# Patient Record
Sex: Female | Born: 1965 | Race: White | Hispanic: No | Marital: Married | Smoking: Current every day smoker
Health system: Southern US, Community
[De-identification: ages and names within clinical notes are randomized; demographics above are authoritative.]

## PROBLEM LIST (undated history)

## (undated) DIAGNOSIS — K629 Disease of anus and rectum, unspecified: Secondary | ICD-10-CM

## (undated) DIAGNOSIS — I10 Essential (primary) hypertension: Secondary | ICD-10-CM

## (undated) DIAGNOSIS — F329 Major depressive disorder, single episode, unspecified: Secondary | ICD-10-CM

## (undated) DIAGNOSIS — I2510 Atherosclerotic heart disease of native coronary artery without angina pectoris: Secondary | ICD-10-CM

## (undated) DIAGNOSIS — F32A Depression, unspecified: Secondary | ICD-10-CM

## (undated) DIAGNOSIS — G4700 Insomnia, unspecified: Secondary | ICD-10-CM

## (undated) DIAGNOSIS — T7840XA Allergy, unspecified, initial encounter: Secondary | ICD-10-CM

## (undated) DIAGNOSIS — F411 Generalized anxiety disorder: Secondary | ICD-10-CM

## (undated) DIAGNOSIS — F419 Anxiety disorder, unspecified: Secondary | ICD-10-CM

## (undated) DIAGNOSIS — I252 Old myocardial infarction: Secondary | ICD-10-CM

## (undated) DIAGNOSIS — I219 Acute myocardial infarction, unspecified: Secondary | ICD-10-CM

## (undated) DIAGNOSIS — M329 Systemic lupus erythematosus, unspecified: Secondary | ICD-10-CM

## (undated) DIAGNOSIS — O24419 Gestational diabetes mellitus in pregnancy, unspecified control: Secondary | ICD-10-CM

## (undated) DIAGNOSIS — E785 Hyperlipidemia, unspecified: Secondary | ICD-10-CM

## (undated) DIAGNOSIS — K219 Gastro-esophageal reflux disease without esophagitis: Secondary | ICD-10-CM

## (undated) DIAGNOSIS — K760 Fatty (change of) liver, not elsewhere classified: Secondary | ICD-10-CM

## (undated) DIAGNOSIS — D649 Anemia, unspecified: Secondary | ICD-10-CM

## (undated) DIAGNOSIS — IMO0002 Reserved for concepts with insufficient information to code with codable children: Secondary | ICD-10-CM

## (undated) DIAGNOSIS — M3500 Sicca syndrome, unspecified: Secondary | ICD-10-CM

## (undated) DIAGNOSIS — I781 Nevus, non-neoplastic: Secondary | ICD-10-CM

## (undated) DIAGNOSIS — L720 Epidermal cyst: Secondary | ICD-10-CM

## (undated) DIAGNOSIS — F988 Other specified behavioral and emotional disorders with onset usually occurring in childhood and adolescence: Secondary | ICD-10-CM

## (undated) DIAGNOSIS — E559 Vitamin D deficiency, unspecified: Secondary | ICD-10-CM

## (undated) HISTORY — DX: Allergy, unspecified, initial encounter: T78.40XA

## (undated) HISTORY — DX: Gastro-esophageal reflux disease without esophagitis: K21.9

## (undated) HISTORY — DX: Gestational diabetes mellitus in pregnancy, unspecified control: O24.419

## (undated) HISTORY — DX: Epidermal cyst: L72.0

## (undated) HISTORY — DX: Nevus, non-neoplastic: I78.1

## (undated) HISTORY — DX: Anemia, unspecified: D64.9

## (undated) HISTORY — DX: Anxiety disorder, unspecified: F41.9

## (undated) HISTORY — DX: Fatty (change of) liver, not elsewhere classified: K76.0

## (undated) HISTORY — DX: Major depressive disorder, single episode, unspecified: F32.9

## (undated) HISTORY — DX: Generalized anxiety disorder: F41.1

## (undated) HISTORY — PX: ABDOMINAL HYSTERECTOMY: SHX81

## (undated) HISTORY — PX: COLON SURGERY: SHX602

## (undated) HISTORY — DX: Sjogren syndrome, unspecified: M35.00

## (undated) HISTORY — DX: Depression, unspecified: F32.A

## (undated) HISTORY — PX: TONSILECTOMY, ADENOIDECTOMY, BILATERAL MYRINGOTOMY AND TUBES: SHX2538

## (undated) HISTORY — DX: Reserved for concepts with insufficient information to code with codable children: IMO0002

## (undated) HISTORY — DX: Other specified behavioral and emotional disorders with onset usually occurring in childhood and adolescence: F98.8

## (undated) HISTORY — DX: Insomnia, unspecified: G47.00

## (undated) HISTORY — DX: Disease of anus and rectum, unspecified: K62.9

## (undated) HISTORY — DX: Essential (primary) hypertension: I10

## (undated) HISTORY — DX: Systemic lupus erythematosus, unspecified: M32.9

## (undated) HISTORY — DX: Old myocardial infarction: I25.2

## (undated) HISTORY — DX: Vitamin D deficiency, unspecified: E55.9

## (undated) HISTORY — DX: Acute myocardial infarction, unspecified: I21.9

## (undated) HISTORY — DX: Hyperlipidemia, unspecified: E78.5

---
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
## 1997-08-21 ENCOUNTER — Observation Stay (HOSPITAL_COMMUNITY): Admission: RE | Admit: 1997-08-21 | Discharge: 1997-08-21 | Payer: Self-pay | Admitting: Family Medicine

## 1997-09-20 ENCOUNTER — Inpatient Hospital Stay (HOSPITAL_COMMUNITY): Admission: AD | Admit: 1997-09-20 | Discharge: 1997-09-20 | Payer: Self-pay | Admitting: Obstetrics and Gynecology

## 1997-09-23 ENCOUNTER — Ambulatory Visit (HOSPITAL_COMMUNITY): Admission: RE | Admit: 1997-09-23 | Discharge: 1997-09-23 | Payer: Self-pay | Admitting: Obstetrics and Gynecology

## 1997-09-27 ENCOUNTER — Ambulatory Visit (HOSPITAL_COMMUNITY): Admission: RE | Admit: 1997-09-27 | Discharge: 1997-09-27 | Payer: Self-pay | Admitting: Obstetrics and Gynecology

## 1997-10-13 ENCOUNTER — Ambulatory Visit (HOSPITAL_COMMUNITY): Admission: RE | Admit: 1997-10-13 | Discharge: 1997-10-13 | Payer: Self-pay | Admitting: Urology

## 1998-10-21 ENCOUNTER — Encounter: Payer: Self-pay | Admitting: Family Medicine

## 1998-10-21 ENCOUNTER — Ambulatory Visit (HOSPITAL_COMMUNITY): Admission: RE | Admit: 1998-10-21 | Discharge: 1998-10-21 | Payer: Self-pay | Admitting: Family Medicine

## 1998-11-03 ENCOUNTER — Encounter (INDEPENDENT_AMBULATORY_CARE_PROVIDER_SITE_OTHER): Payer: Self-pay | Admitting: Specialist

## 1998-11-03 ENCOUNTER — Other Ambulatory Visit: Admission: RE | Admit: 1998-11-03 | Discharge: 1998-11-03 | Payer: Self-pay | Admitting: Gastroenterology

## 1999-01-19 ENCOUNTER — Ambulatory Visit (HOSPITAL_COMMUNITY): Admission: RE | Admit: 1999-01-19 | Discharge: 1999-02-03 | Payer: Self-pay | Admitting: General Surgery

## 2001-11-14 ENCOUNTER — Other Ambulatory Visit: Admission: RE | Admit: 2001-11-14 | Discharge: 2001-11-14 | Payer: Self-pay | Admitting: Obstetrics and Gynecology

## 2002-01-02 ENCOUNTER — Encounter: Payer: Self-pay | Admitting: Obstetrics and Gynecology

## 2002-01-02 ENCOUNTER — Ambulatory Visit (HOSPITAL_COMMUNITY): Admission: RE | Admit: 2002-01-02 | Discharge: 2002-01-02 | Payer: Self-pay | Admitting: Obstetrics and Gynecology

## 2002-03-24 ENCOUNTER — Encounter: Admission: RE | Admit: 2002-03-24 | Discharge: 2002-03-24 | Payer: Self-pay | Admitting: Obstetrics and Gynecology

## 2002-05-05 ENCOUNTER — Encounter (HOSPITAL_COMMUNITY): Admission: RE | Admit: 2002-05-05 | Discharge: 2002-05-28 | Payer: Self-pay | Admitting: Obstetrics and Gynecology

## 2002-05-30 ENCOUNTER — Inpatient Hospital Stay (HOSPITAL_COMMUNITY): Admission: AD | Admit: 2002-05-30 | Discharge: 2002-06-02 | Payer: Self-pay | Admitting: Obstetrics and Gynecology

## 2002-07-30 ENCOUNTER — Ambulatory Visit (HOSPITAL_COMMUNITY): Admission: RE | Admit: 2002-07-30 | Discharge: 2002-07-30 | Payer: Self-pay | Admitting: Obstetrics and Gynecology

## 2005-09-17 ENCOUNTER — Emergency Department (HOSPITAL_COMMUNITY): Admission: EM | Admit: 2005-09-17 | Discharge: 2005-09-17 | Payer: Self-pay | Admitting: Family Medicine

## 2005-12-31 ENCOUNTER — Other Ambulatory Visit: Admission: RE | Admit: 2005-12-31 | Discharge: 2005-12-31 | Payer: Self-pay | Admitting: Family Medicine

## 2006-02-27 ENCOUNTER — Encounter: Admission: RE | Admit: 2006-02-27 | Discharge: 2006-02-27 | Payer: Self-pay | Admitting: Family Medicine

## 2006-06-03 ENCOUNTER — Encounter (INDEPENDENT_AMBULATORY_CARE_PROVIDER_SITE_OTHER): Payer: Self-pay | Admitting: Specialist

## 2006-06-03 ENCOUNTER — Ambulatory Visit (HOSPITAL_COMMUNITY): Admission: RE | Admit: 2006-06-03 | Discharge: 2006-06-04 | Payer: Self-pay | Admitting: Obstetrics and Gynecology

## 2006-06-27 ENCOUNTER — Encounter: Admission: RE | Admit: 2006-06-27 | Discharge: 2006-06-27 | Payer: Self-pay | Admitting: Family Medicine

## 2006-07-02 ENCOUNTER — Ambulatory Visit (HOSPITAL_COMMUNITY): Admission: RE | Admit: 2006-07-02 | Discharge: 2006-07-02 | Payer: Self-pay | Admitting: Family Medicine

## 2008-06-09 ENCOUNTER — Encounter (INDEPENDENT_AMBULATORY_CARE_PROVIDER_SITE_OTHER): Payer: Self-pay | Admitting: Urology

## 2008-06-09 ENCOUNTER — Ambulatory Visit (HOSPITAL_COMMUNITY): Admission: RE | Admit: 2008-06-09 | Discharge: 2008-06-09 | Payer: Self-pay | Admitting: Urology

## 2009-12-26 ENCOUNTER — Encounter: Admission: RE | Admit: 2009-12-26 | Discharge: 2009-12-26 | Payer: Self-pay | Admitting: Family Medicine

## 2009-12-29 ENCOUNTER — Encounter: Admission: RE | Admit: 2009-12-29 | Discharge: 2009-12-29 | Payer: Self-pay | Admitting: Family Medicine

## 2010-05-31 LAB — HEMOGLOBIN AND HEMATOCRIT, BLOOD
HCT: 45.8 % (ref 36.0–46.0)
Hemoglobin: 15.9 g/dL — ABNORMAL HIGH (ref 12.0–15.0)

## 2010-07-07 NOTE — Op Note (Signed)
NAME:  Lynn Harvey, Lynn Harvey                         ACCOUNT NO.:  [id number]   MEDICAL RECORD NO.:  [id number]                   PATIENT TYPE:  AMB   LOCATION:  DAY                                  FACILITY:  [facility]   PHYSICIAN:  Zenaida Niece, M.D.             DATE OF BIRTH:  02-05-1966   DATE OF PROCEDURE:  07/30/2002  DATE OF DISCHARGE:                                 OPERATIVE REPORT   PREOPERATIVE DIAGNOSIS:  Desires surgical sterility.   POSTOPERATIVE DIAGNOSIS:  Desires surgical sterility.   PROCEDURE:  Laparoscopic bilateral tubal fulguration.   SURGEON:  Zenaida Niece, M.D.   ANESTHESIA:  General endotracheal tube.   ESTIMATED BLOOD LOSS:  Less than 50 mL.   FINDINGS:  Normal uterus, ovaries and left tube. The right tube was curled  upon itself with some filmy adhesions.   DESCRIPTION OF PROCEDURE:  The patient was taken to the operating room and  placed in the dorsal supine position. General anesthesia was induced and she  was placed in mobile stirrups. The abdomen was prepped and draped in the  usual sterile fashion, bladder drained with a red rubber catheter, Hulka  tenaculum applied to her cervix for uterine manipulation. Infraumbilical  skin was infiltrated with 0.25% Marcaine and a 1/2 cm horizontal incision  was made. The Veress needle was inserted and placement confirmed by the  water drop test and an opening pressure of -1 mmHg. Three liters of CO2 gas  were insufflated and the Veress needle was removed. The 10/11 disposable  trocar was then introduced and placement confirmed by the laparoscope.  Inspection revealed some adhesions of the bowel and omentum to both lateral  abdominal walls. Appendix appeared normal as was the liver edge and  gallbladder. The uterus, left tube and ovary and right ovary appeared  normal. The right tube was curled upon itself. Both fallopian tubes were  able to be identified and traced to their fimbriated ends. On the left  tube  which was more easily seen, three segments of this tube were fulgurated with  bipolar cautery to achieve dessication of the tube. This achieved good  results and hemostasis. All sites were fulgurated until the amp meter read  zero. On the right side as the tube was curled up on itself, several  segments of the tube were fulgurated with bipolar cautery until the amp  meter read zero. This did achieve good dessication of the tube and all sites  were hemostatic. The laparoscope was then removed and all gas allowed to  deflate from the abdomen. The trocar was removed and the skin was closed  with interrupted subcuticular sutures of 4-0 Vicryl followed by Steri-Strips  and a Band-Aid. The Hulka tenaculum was then removed. The patient was  extubated in the operating room, tolerated the procedure well and was taken  to the recovery room in stable condition.  Zenaida Niece, M.D.    TDM/MEDQ  D:  07/30/2002  T:  07/30/2002  Job:  [id number]

## 2010-07-07 NOTE — Op Note (Signed)
NAMEMARNA, Lynn Harvey               ACCOUNT NO.:  [id number]   MEDICAL RECORD NO.:  [id number]          PATIENT TYPE:  AMB   LOCATION:  DAY                          FACILITY:  [facility]   PHYSICIAN:  Valetta Fuller, M.D.  DATE OF BIRTH:  02/15/1966   DATE OF PROCEDURE:  06/10/2008  DATE OF DISCHARGE:                               OPERATIVE REPORT   PREOPERATIVE DIAGNOSES:  1. Extrusion/erosion of suburethral vaginal sling.  2. Pelvic discomfort.   POSTOPERATIVE DIAGNOSES:  1. Extrusion/erosion of suburethral vaginal sling.  2. Pelvic discomfort.   PROCEDURE PERFORMED:  1. Vaginal exam under anesthesia.  2. Excision of portion of extruded suburethral sling with repeat      multilayer vaginal closure.  3. Flexible cystoscopy.   SURGEON:  Valetta Fuller, M.D.   ASSISTANT:  Sigmund I. Patsi Sears, M.D.   ANESTHESIA:  General.   INDICATIONS:  Lynn Harvey is a 45 year old female.  Approximately 2 years  ago she underwent a mid urethral retropubic sling for fairly straight  forward stress incontinence.  This was done in conjunction with  hysterectomy.  The patient's surgery went well.  Her results were  extremely good.  She came in for routine postop and everything looked  normal.  She never kept her additional follow-up appointments.  The  patient tells me that she has done absolutely fine urologically, has had  no recurrent incontinence.  No other difficulties.  She began  experiencing some nonspecific pelvic discomfort.  She was evaluated by  one of the nurse practitioners in Dr. Emeline Darling office  and thought to  have am area of induration under her urethra.  She was sent for  assessment in our office.  On clinical exam, the patient did have some  induration under her urethra.  One could see a small little portion of  exposed sling.  There did not appear to be gross infection.  There was  no purulence.  No marked induration or fluctuance.  She had no vaginal  discharge.  Clinical  exam was consistent with a small extrusion of her  sling.  We suggested that she have exploration under anesthesia with  excision of any extruded sling material and repeat closure of the  vagina.  She understood that this does bring up the possible recurrence  of stress incontinence but that in the majority of cases the patients  will continue to be continent in situations like this unless a real  formal urethral lysis is performed which did not appear necessary in  this case.  There was certainly no evidence of gross infection but I  suspect she does have obvious colonization.   TECHNIQUE AND FINDINGS:  The patient was brought to the operating room.  She had successful induction of general anesthesia.  Appropriate patient  time-out was performed.  She did receive perioperative Ancef.  She was  placed in the mid lithotomy position, prepped and draped in the usual  manner.  Foley catheter was placed sterilely on the field.  A weighted  vaginal speculum was utilized.  Clinical exam showed a small area,  approximately 2  cm, of sling extrusion at the midline extending off  towards the right retropubic space.  We ended up taking an ellipse of  vaginal tissue surrounding the sling.  The extruded portion of the sling  was then excised.  The vaginal tissues were then copiously irrigated.  We raised a well vascularized of flap of vaginal mucosa.  After copious  irrigation of multilayer closure with Vicryl was used to cover the area  of the previous extrusion.  Some light packing was utilized with Estrace  vaginal cream.  The Foley catheter was removed and flexible cystoscopy  was performed.  We saw no evidence of any erosion into the urethra and  no evidence of any sling material within the bladder and no other  findings of note.  The patient appeared to tolerate the procedure well  and  was brought to recovery room in stable condition.      Valetta Fuller, M.D.  Electronically  Signed     DSG/MEDQ  D:  06/10/2008  T:  06/10/2008  Job:  [id number]   cc:   Sherron Monday, MD  Fax: [phone]

## 2010-07-07 NOTE — H&P (Signed)
NAME:  GREGORY, DOWE               ACCOUNT NO.:  [id number]   MEDICAL RECORD NO.:  [id number]          PATIENT TYPE:  AMB   LOCATION:  SDC                           FACILITY:  [facility]   PHYSICIAN:  Sherron Monday, MD        DATE OF BIRTH:  07-Nov-1965   DATE OF ADMISSION:  DATE OF DISCHARGE:                              HISTORY & PHYSICAL   ADMITTING DIAGNOSIS:  Menorrhagia, pelvic relaxation, urinary  incontinence.  She is being admitted for a laparoscopic assisted vaginal  hysterectomy and possible unilateral salpingo-oophorectomy or bilateral  salpingo-oophorectomy, anterior and posterior repair as well as  cystoscopy, midurethral sling with Dr. Isabel Caprice.   HISTORY OF PRESENT ILLNESS:  This is a 45 year old, G5, P2, 1, 2, 3 with  heavy menstrual cycles.  She describes stress urinary incontinence and  has to wear pads.  She also has a history of constipation and complains  of pressure.  She also complains of menstruation she claims from her  urethra.   PAST MEDICAL HISTORY:  Significant for history of gestational diabetes.   PAST SURGICAL HISTORY:  Significant for tonsillectomy and adenoidectomy,  wrist surgery, bilateral tubal ligation, hemorrhoidectomy, laparoscopic  surgery for an ectopic pregnancy.   PAST OB/GYN HISTORY:  G5, P2, 1, 2, 3.  She had a 35 week spontaneous  vaginal delivery female infant, a term vaginal delivery of a female  infant, term vaginal delivery of a 45-year-old, ectopic pregnancy and a  miscarriage.  She has a history of an abnormal pap smear with a repeat  that was normal and has recently had a pap smear six months ago per her  report.  She has had a remote history of chlamydia.  She states her  periods are 7 days every month with cramping and clotting.  She uses  tubal ligation for contraception.   MEDICATIONS:  None.   ALLERGIES:  MORPHINE WHICH CAUSES NAUSEA AND VOMITING.   SOCIAL HISTORY:  She is a pack and a half day smoker, occasional  alcohol, no  drugs.  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   FAMILY HISTORY:  Significant for diabetes on her maternal side, high  blood pressure in her maternal grandfather.  Cancer in a paternal  grandmother which was breast cancer, and a mother which was uterine  cancer.  No colon or ovarian cancer.  Coronary artery disease on both  sides.  She says her father has had a triple bypass.  She had a  mammogram approximately a month before her initial evaluation which was  within normal limits.   PHYSICAL EXAMINATION:  VITAL SIGNS:  She was afebrile, vital signs are  stable.  GENERAL:  In no apparent distress.  CARDIOVASCULAR:  Regular rate and rhythm.  LUNGS:  Clear to auscultation bilaterally.  NECK:  No lymphadenopathy.  HEENT:  Mucous membranes are moist.  Thyroid is within normal limits.  BREAST EXAM:  Deferred.  BACK:  Without costovertebral angle tenderness.  ABDOMEN:  Soft, nontender, and nondistended, good bowel sounds.  EXTREMITIES:  Symmetric and nontender.  PELVIC:  Normal external female genitalia, normal BUS.  Cervix and  vagina are without lesions.  No cervical motion tenderness.  Uterus is  retroverted, normal uterus, and normal adnexa, 1-2/4 cystocele, 1-2/4  rectocele.   Prior to her posting for surgery, she had a cystometric evaluation with  Urology and we plan to proceed with an LAVH.  This was discussed with  the patient that there is a chance that we would have to take one or  both ovaries out.  We discussed with her risks, benefits, and  alternatives of the surgery including bleeding, infection, damage to the  surrounding organs including bowels, bladder, ureter, and blood vessels  as well as if her ovaries need to be removed surgical menopause.  The  patient voices understanding to all this.  We will proceed with surgery  on June 03, 2006 with Dr. Isabel Caprice as mentioned.      Sherron Monday, MD  Electronically Signed      JB/MEDQ  D:  05/31/2006  T:  05/31/2006  Job:  [id number]

## 2010-07-07 NOTE — Op Note (Signed)
NAME:  Lynn Harvey, Lynn Harvey NO.:  [id number]   MEDICAL RECORD NO.:  [id number]          PATIENT TYPE:  AMB   LOCATION:  SDC                           FACILITY:  [facility]   PHYSICIAN:  Valetta Fuller, M.D.  DATE OF BIRTH:  1965/05/04   DATE OF PROCEDURE:  06/03/2006  DATE OF DISCHARGE:                               OPERATIVE REPORT   PREOPERATIVE DIAGNOSIS:  Stress urinary incontinence.   POSTOPERATIVE DIAGNOSIS:  Stress urinary incontinence.   PROCEDURE PERFORMED:  Midurethral retropubic sling [facility]).   SURGEON:  Valetta Fuller, M.D.   ANESTHESIA:  General.   ASSISTANT:  Sherron Monday, MD.   INDICATIONS:  [name] is a 45 year old female.  She was recently sent  through our office for assessment of some stress urinary incontinence.  She is 45 years of age.  She has had some problems with irregular and  painful menses, and is felt to be a candidate for a laparoscopic-  assisted vaginal hysterectomy.  She is thought to have some questionable  endometriosis.  The patient has had a history of some progressive  longstanding stress urinary incontinence.  She leaks mostly with  coughing and sneezing.  She does not leak with the most normal daily  activities.  She does wear a panty liner on a regular basis.  She really  had no other voiding dysfunction with no real nocturia, no frequency, no  urgency, no urge incontinence.  She had no prolapse symptoms.  She had  five pregnancies with three vaginal deliveries..  She had some  questionable hematuria at the time her menstruation, and some thought  his to the possibility of possible endometriosis involving her bladder,  although that was not clear-cut.  Clinical exam revealed urethral  hypermobility with grade 1 cystocele.  Since she had a straight forward  history without any other bladder dysfunction. we felt that formal  urodynamics were not necessary.  The patient did have a bedside  assessment with bedside  cystometrogram and Marshall's testing.  The  patient appeared to understand the advantages and disadvantages of a  concurrent anti-incontinence procedure along with her hysterectomy.  We  went through things at length.  We talked about the success rates,  potential complications and issues with sling surgery.  She elected to  proceed.  The patient had undergone uneventful hysterectomy by Dr.  Ellyn Hack.  When we entered the room the patient was in a high lithotomy  position and had already been prepped.  Foley catheter was indwelling.  We lowered her legs to a mid lithotomy position.  Catheter was drained.  A weighted vaginal speculum was utilized.  The anterior vaginal tissue  overlying the mid urethra was infiltrated with some lidocaine with  epinephrine.  A small midline incision was then made.  Vaginal flaps  were raised, allowing for dissection at the level of the mid urethra.  We continued this dissection towards retropubic space until we were able  to palpate that area.  Once we were satisfied with that, we went up top  and performed two few small stab incisions just  to the lateral of the  midline, right above the pubic symphysis.  With direct digital finger  control in the retropubic area, we were able to pass the needles on both  sides without difficulty.  These appeared come out very nicely at the  level of the mid urethra.   The Foley catheter was then removed.  We saw no evidence of needle entry  into the bladder.  Blue dye could be seen from both ureteral orifices.  No evidence of any other bladder abnormalities were appreciated.  With  the needles confirmed to be in good position, we then anchored the sling  to the ends of the needles and these were then brought up.  A right  angle clamp was used at the level of the mid urethra to assure proper  tension.  The sheath of the sling was then removed and the redundant  sling was cut at the level of the suprapubic incisions.  The  whole area  was copiously irrigated.  There was a little bit of oozing from the  vaginal incisions up at the retropubic areas. coming down from closer to  the endopelvic fascia.  We placed a little piece of Surgicel on both  sides and, with some gentle pressure and time, no additional bleeding  was encountered.  Again the sling appeared to be position very nicely at  the mid urethra with proper tensioning.  Vaginal incision was closed  with a running 2-0 Vicryl suture.  No additional bleeding was  encountered.  Vaginal packing was utilized with bacitracin.  The Foley  catheter had been reinserted and was draining clear urine.  Sponge and  needle counts were correct.  The patient appeared to tolerate the  procedure well.  There were no obvious complications or problems, and  she was brought to recovery room in stable condition.  The hysterectomy  portion of the dictation will be dictated separately by Dr. Ellyn Hack.           ______________________________  Valetta Fuller, M.D.  Electronically Signed     DSG/MEDQ  D:  06/03/2006  T:  06/03/2006  Job:  [id number]   cc:   Sherron Monday, MD  Fax: [phone]

## 2010-07-07 NOTE — Discharge Summary (Signed)
NAME:  Lynn Harvey, Lynn Harvey                         ACCOUNT NO.:  [id number]   MEDICAL RECORD NO.:  [id number]                   PATIENT TYPE:  INP   LOCATION:  9132                                 FACILITY:  [facility]   PHYSICIAN:  Zenaida Niece, M.D.             DATE OF BIRTH:  22-May-1965   DATE OF ADMISSION:  05/30/2002  DATE OF DISCHARGE:  06/02/2002                                 DISCHARGE SUMMARY   ADMISSION DIAGNOSES:  1. Intrauterine pregnancy at 39 weeks.  2. Class A2 gestational diabetes.  3. Group B Strep carrier.   DISCHARGE DIAGNOSES:  1. Intrauterine pregnancy at 39 weeks.  2. Class A2 gestational diabetes.  3. Group B Strep carrier.   PROCEDURE:  On April 11, she had a spontaneous vaginal delivery.   HISTORY OF PRESENT ILLNESS:  This is a 45 year old white female, gravida 5,  para 2-0-2-2, with an EGA of [redacted] weeks by an LMP consistent with an 8-week  ultrasound with a due date of April 14, who presents with regular  contractions, no bleeding or rupture of membranes, and good fetal movement.  Evaluation in triage revealed her to have regular contractions and her  cervix was 3 cm dilated and she was admitted.  Prenatal care was complicated  by class A2 gestational diabetes which has been controlled with Glyburide  initially 2.5 mg q.a.m. and now b.i.d. and she has had reactive nonstress  test.  She also had bacterial vaginosis in the first trimester treated with  p.o. Flagyl.   PRENATAL LABORATORY DATA:  Blood type is A positive with a negative antibody  screen, RPR nonreactive, rubella immune, hepatitis B surface antigen  negative, Gonorrhea and Chlamydia negative, triple screen normal, one-hour  Glucola 174, three-hour GTT 85, 197, 186, and 168, and Group B Strep was  positive.   PAST OBSTETRICAL HISTORY:  In 1986, vaginal delivery at 35 weeks, 5 pounds 8  ounces, complicated by preterm labor.  In 1993 spontaneous abortion.  In  1995, spontaneous vaginal delivery  at 40 weeks, 6 pounds 15 ounces, with no  complications.  In 1999 left ectopic pregnancy.   GYN HISTORY:  History of gonorrhea and Chlamydia.   PAST SURGICAL HISTORY:  Tonsillectomy, bone graft in the right wrist.  Hemorrhoidectomy and salpingostomy for her ectopic pregnancy.   MEDICATIONS:  Glyburide 2.5 mg p.o. b.i.d.   SOCIAL HISTORY:  She is married and smokes a pack of cigarettes a day.   PHYSICAL EXAMINATION:  VITAL SIGNS: She is afebrile with stable vital signs.  Fetal heart rate tracing reassuring with contractions every three to four  minutes.  CBG on the morning of April 11, was 110's.  ABDOMEN:  Gravid and nontender with an estimated fetal weight of 7 pounds.  PELVIC:  Vaginal examination per the nurse was 3 to 4 cm dilated, 50%  effaced, -3 station, with a vertex presentation.   HOSPITAL COURSE:  The patient was admitted in what appeared to be early  labor.  However, after several hours on labor and delivery with contractions  every three to four minutes, she had no cervical change.  The patient  initially declined Pitocin and wanted amniotomy.  However, I discussed with  the patient that amniotomy was not a wise decision with the station so high.  She was also started on penicillin for Group B Strep prophylaxis.  She did  eventually agree as to use Pitocin.  She had spontaneous rupture of  membranes when she entered active labor and received an epidural.  She  progressed to complete, pushed well, and on the afternoon of April 11, had a  vaginal delivery of a viable female infant with Apgars of 9 and 9 that weighed  7 pounds 8 ounces.  Placenta delivered spontaneous and was intact with a  three vessel cord. She had a second degree midline laceration repaired with  3-0 Vicryl with local block.  Estimated blood loss was less than 500 mL.  The patient wished to proceed with tubal ligation.  This was initially  scheduled on April 12, however, there was a delay in the operating  room that  was going to push the surgery back too far, so the patient rescheduled until  April 13.  Again due to a miscommunication, the surgery was scheduled at a  different time than what the patient initially was told and she then  declined to undergo the procedure and wanted to go home on postpartum day  #2.  She wanted to do the tubal as an outpatient later.  Predelivery  hemoglobin 12.4, postdelivery 10.8.  She had no other complications and on  the morning of postpartum day #2 was stable for discharge home.   DISCHARGE INSTRUCTIONS:  Regular diet, pelvic rest, and follow up in six  weeks.   DISCHARGE MEDICATIONS:  1. Darvocet-N 100 #20 one p.o. q.6h p.r.n.  2. Over-the-counter Motrin as needed.   She is also given our discharge pamphlet.                                               Zenaida Niece, M.D.    TDM/MEDQ  D:  06/02/2002  T:  06/02/2002  Job:  [id number]

## 2010-07-07 NOTE — Op Note (Signed)
NAME:  Lynn Harvey, Lynn Harvey               ACCOUNT NO.:  [id number]   MEDICAL RECORD NO.:  [id number]          PATIENT TYPE:  OIB   LOCATION:  9304                          FACILITY:  [facility]   PHYSICIAN:  Sherron Monday, MD        DATE OF BIRTH:  Jan 09, 1966   DATE OF PROCEDURE:  06/03/2006  DATE OF DISCHARGE:                               OPERATIVE REPORT   PREOPERATIVE DIAGNOSES:  1. Pelvic pain.  2. Menorrhagia.  3. Pelvic relaxation.  4. Stress urinary incontinence.   POSTOPERATIVE DIAGNOSES:  1. Pelvic pain.  2. Menorrhagia.  3. Pelvic relaxation.  4. Stress urinary incontinence.   PROCEDURE:  Laparoscopic-assisted vaginal hysterectomy, posterior  colporrhaphy, Lynx mid urethral retropubic sling, cystoscopy.   SURGEON:  Sherron Monday MD.   ASSISTANTLavina Hamman MD.   ANESTHESIA:  General.   SPECIMENS:  Uterus and cervix.   EBL:  250 mL.   IV FLUIDS:  2300 mL.   URINE OUTPUT:  200 mL plus.   COMPLICATIONS:  None.   DISPOSITION:  Stable to PACU.   PROCEDURE:  After informed consent was reviewed with the patient  including risks, benefits and alternatives of the surgical procedure,  she was transported to the operating room, placed on the table in supine  position.  General anesthesia was induced and found to be adequate.  She  was then placed in the yellow-fin stirrups, prepped and draped in the  normal sterile fashion.  Initially her bladder was drained sterilely  with the catheter, then using the heavy weighted speculum and the  Deaver, her cervix was easily visualized, grasped with a single tooth  tenaculum and the uterine manipulator was placed without difficulty.  Instruments removed from the vagina.  Gown and gloves were donned.  The  case proceeded.  An approximately 5 mm periumbilical incision was made  using a Veress needle, pneumoperitoneum was obtained  after placement of  the Veress needle.  Placement was confirmed using the hanging drop test  with  saline, and instillation.  Pneumoperitoneum was obtained.  Using  direct visualization, a 5 mm port was placed infraumbilically, the  camera was inserted after the patient was placed in Trendelenburg to  visualize her pelvis with the above-mentioned findings.  The accessory  ports were placed after visualization of the epigastric vessels.  Five  mm ports were placed on both the right and the left.  Using the an  atraumatic grasper as well as a blunt probe, her  pelvic survey was  further performed revealing filmy adhesions from posterior to the uterus  to the posterior peritoneum as well as to the colon.  Using the Ace  Harmonic, these adhesions were separated easily.  The Ace Harmonic was  then used to ligate the round ligament on the left and ligate the  fallopian tube.  The uterine vessels were skeletonized and a bladder  flap was created with the ACE harmonic.  Attention was then turned to  the right side which, in a similar fashion, both the round ligament was  ligated as well as the fallopian tube.  The uterine artery was  skeletonized, bladder flap was continued.  A brief survey was performed  after this procedure, hemostasis was noted.  Attention was then turned  to the vagina for the remaining portion of the case.  The heavy weighted  speculum and a Deaver retractor were used to easily visualize the  cervix.  The uterine manipulator was removed and two Christella Hartigan tenaculums  were used to grasp the cervix.  The uterus was then circumscribed using  Bovie cautery after Pitressin had been infiltrated.  The posterior cul-  de-sac was then entered.  A single suture of #0 Vicryl was placed at 6  o'clock at this opening for hemostasis.  An attempt was made to enter  the peritoneal cavity anteriorly.  It was unsuccessful.  The uterosacral  ligaments are ligated bilaterally.  These pedicles were held with #0  Vicryl.  The cardinal ligament pedicles were createdand ligated using 0  vicryl.  The  uterine arteries were ligated.  The uterus was delivered.  Hemostasis was confirmed along the cuff with several interrupted sutures  of #0 Vicryl.  The cuff was closed using #2-0 Vicryl in a running locked  fashion.  The uterosacral ligaments were plicated using #0 Vicryl and  the held uterosacral stitches were then also tied together.  Inspection  was performed and a grade 1 cystocele was noted.  Therefore, the  attention was turned to the rectocele which was noted to be a grade 2.  The hymenal ring was grasped with Allis clamps.  The mucosa was then  incised to the level of the cuff and the fascia was dissected and  reapproximated using #2-0 Vicryl and interrupted sutures for the fascial  layer and #2-0 running locked for the mucosal layer.  Hemostasis was  assured.  Gloves were changed the abdomen was inspected and found to be  hemostatic.  Dr. Isabel Caprice then performed the suburethral sling as well as  a cystoscopy.  Jets from bilateral urethra was noted with the  cystoscopy.  Following the procedure, patient was taken in stable  condition to the PACU.  Sponge, lap and needle counts were correct x2  per the operating room staff.      Sherron Monday, MD  Electronically Signed     JB/MEDQ  D:  06/03/2006  T:  06/03/2006  Job:  [id number]

## 2010-07-07 NOTE — Discharge Summary (Signed)
Lynn Harvey, Lynn Harvey               ACCOUNT NO.:  [id number]   MEDICAL RECORD NO.:  [id number]          PATIENT TYPE:  OIB   LOCATION:  9304                          FACILITY:  [facility]   PHYSICIAN:  Sherron Monday, MD        DATE OF BIRTH:  05/23/1965   DATE OF ADMISSION:  06/03/2006  DATE OF DISCHARGE:  06/04/2006                               DISCHARGE SUMMARY   ADMISSION DIAGNOSES:  1. Menorrhagia.  2. Pelvic relaxation.  3. Urinary incontinence.   PROCEDURES:  Laparoscopic assisted vaginal hysterectomy; posterior  colporrhaphy; Linx retropubic sling as well as cystoscopy with Dr.  Isabel Caprice.   HISTORY OF PRESENT ILLNESS:  A 45 year old G5, P2, 1, 2, 3, with  complaints of menorrhagia as well as stress urinary incontinence, having  to wear pads to control her leaking. She also has a history of  constipation and complains of pelvic pressure.  She also complains of  menstruation from what she believes is her urethra.   PAST MEDICAL HISTORY:  Gestational diabetes.   PAST SURGICAL HISTORY:  Tonsillectomy, adenoidectomy; wrist surgery;  bilateral tubal ligation; hemorrhoidectomy; laparoscopic surgery for an  ectopic pregnancy.   OB/GYN SURGERY:  G5, P2-1-2-3. Pregnancies were 35-week NSVD of a female  infant; a term NSVD of a female infant; term NSVD of a 76-year-old  infant, an ectopic pregnancy and a miscarriage.  She has a history of an  abnormal Pap smear, on repeat it was normal and she has recently had  normal Pap smear per her report.  She has a remote history of chlamydia.  History of menses that are 7 days long with cramping and clotting.   MEDICATIONS:  None.   ALLERGIES:  MORPHINE (causes nausea and vomiting).   SOCIAL HISTORY:  She is a pack and 1/2 day smoker. Occasional alcohol.  No drugs. She is married and works in Clinical biochemist.   FAMILY HISTORY:  Significant for diabetes, high blood pressure, breast  cancer as well as uterine cancer, and coronary artery  disease.   PHYSICAL EXAMINATION:  On admission she was afebrile, vital signs  stable.  Benign exam. She was admitted to undergo the laparoscopic  assisted vaginal hysterectomy, a possible anterior colporrhaphy,  posterior colporrhaphy as well as Linx urethral sling and cystoscopy  with Dr. Isabel Caprice.  Prior to her admission we discussed the risks,  benefits and alternatives of these surgeries which she understood.   HOSPITAL COURSE:  On June 03, 2006 she underwent the above surgeries.  She had an EBL of approximately 500 cc. She tolerated the surgeries  well.   Her postoperative course was uncomplicated.  She remained afebrile with  stable vital signs, had good urine output.  Her hemoglobin decreased  from 13.8 to 11.1.  Her exam remained benign.  She had an initial trial  of voiding with an elevated residual, however, after further education  regarding the voiding trials and the purpose of it, her remaining  voiding trials were good and she was completely emptying her bladder.  She was encouraged to void every 2 hours.   She was discharged  home with prescriptions for Percocet and Motrin as  well as follow-up plan with Dr. Isabel Caprice and myself. She was also given  routine discharge instructions and the numbers to call with any  questions or problems. To use Colace or MiraLax for constipation and  simethicone for gas pains. She understood all of this and was anxious to  go home.      Sherron Monday, MD  Electronically Signed     JB/MEDQ  D:  06/04/2006  T:  06/04/2006  Job:  [id number]

## 2011-03-20 ENCOUNTER — Other Ambulatory Visit: Payer: Self-pay | Admitting: Obstetrics and Gynecology

## 2011-03-20 DIAGNOSIS — Z1231 Encounter for screening mammogram for malignant neoplasm of breast: Secondary | ICD-10-CM

## 2011-03-27 ENCOUNTER — Encounter: Payer: Self-pay | Admitting: Physician Assistant

## 2011-03-27 ENCOUNTER — Ambulatory Visit (INDEPENDENT_AMBULATORY_CARE_PROVIDER_SITE_OTHER): Payer: No Typology Code available for payment source | Admitting: Emergency Medicine

## 2011-03-27 ENCOUNTER — Ambulatory Visit: Payer: Self-pay

## 2011-03-27 VITALS — BP 170/100 | HR 91 | Temp 99.0°F | Resp 16 | Ht 63.0 in | Wt 136.6 lb

## 2011-03-27 DIAGNOSIS — F339 Major depressive disorder, recurrent, unspecified: Secondary | ICD-10-CM

## 2011-03-27 DIAGNOSIS — I10 Essential (primary) hypertension: Secondary | ICD-10-CM

## 2011-03-27 LAB — CBC WITH DIFFERENTIAL/PLATELET
Eosinophils Absolute: 0.1 10*3/uL (ref 0.0–0.7)
Eosinophils Relative: 2 % (ref 0–5)
HCT: 43 % (ref 36.0–46.0)
Lymphocytes Relative: 30 % (ref 12–46)
Lymphs Abs: 1.9 10*3/uL (ref 0.7–4.0)
MCHC: 34.4 g/dL (ref 30.0–36.0)
Monocytes Relative: 8 % (ref 3–12)

## 2011-03-27 LAB — LIPID PANEL
Cholesterol: 202 mg/dL — ABNORMAL HIGH (ref 0–200)
Triglycerides: 417 mg/dL — ABNORMAL HIGH (ref <150)

## 2011-03-27 LAB — COMPREHENSIVE METABOLIC PANEL
ALT: 19 U/L (ref 0–35)
CO2: 23 mEq/L (ref 19–32)
Calcium: 9.2 mg/dL (ref 8.4–10.5)
Chloride: 105 mEq/L (ref 96–112)
Glucose, Bld: 101 mg/dL — ABNORMAL HIGH (ref 70–99)
Sodium: 137 mEq/L (ref 135–145)
Total Bilirubin: 0.7 mg/dL (ref 0.3–1.2)
Total Protein: 6.7 g/dL (ref 6.0–8.3)

## 2011-03-27 MED ORDER — METOPROLOL SUCCINATE ER 50 MG PO TB24: ORAL_TABLET | ORAL | Status: DC

## 2011-03-27 NOTE — Patient Instructions (Addendum)
Start Metoprolol as directed for hypertension. Return 1 month for follow up   Hypertension   As your heart beats, it forces blood through your arteries. This force is your blood pressure. If the pressure is too high, it is called hypertension (HTN) or high blood pressure. HTN is dangerous because you may have it and not know it. High blood pressure may mean that your heart has to work harder to pump blood. Your arteries may be narrow or stiff. The extra work puts you at risk for heart disease, stroke, and other problems.  Blood pressure consists of two numbers, a higher number over a lower, 110/72, for example. It is stated as "110 over 72." The ideal is below 120 for the top number (systolic) and under 80 for the bottom (diastolic). Write down your blood pressure today. You should pay close attention to your blood pressure if you have certain conditions such as:  Heart failure.   Prior heart attack.   Diabetes   Chronic kidney disease.   Prior stroke.   Multiple risk factors for heart disease.  To see if you have HTN, your blood pressure should be measured while you are seated with your arm held at the level of the heart. It should be measured at least twice. A one-time elevated blood pressure reading (especially in the Emergency Department) does not mean that you need treatment. There may be conditions in which the blood pressure is different between your right and left arms. It is important to see your caregiver soon for a recheck. Most people have essential hypertension which means that there is not a specific cause. This type of high blood pressure may be lowered by changing lifestyle factors such as:  Stress.   Smoking.   Lack of exercise.   Excessive weight.   Drug/tobacco/alcohol use.   Eating less salt.  Most people do not have symptoms from high blood pressure until it has caused damage to the body. Effective treatment can often prevent, delay or reduce that  damage. TREATMENT  When a cause has been identified, treatment for high blood pressure is directed at the cause. There are a large number of medications to treat HTN. These fall into several categories, and your caregiver will help you select the medicines that are best for you. Medications may have side effects. You should review side effects with your caregiver. If your blood pressure stays high after you have made lifestyle changes or started on medicines,   Your medication(s) may need to be changed.   Other problems may need to be addressed.   Be certain you understand your prescriptions, and know how and when to take your medicine.   Be sure to follow up with your caregiver within the time frame advised (usually within two weeks) to have your blood pressure rechecked and to review your medications.   If you are taking more than one medicine to lower your blood pressure, make sure you know how and at what times they should be taken. Taking two medicines at the same time can result in blood pressure that is too low.  SEEK IMMEDIATE MEDICAL CARE IF:  You develop a severe headache, blurred or changing vision, or confusion.   You have unusual weakness or numbness, or a faint feeling.   You have severe chest or abdominal pain, vomiting, or breathing problems.  MAKE SURE YOU:   Understand these instructions.   Will watch your condition.   Will get help right away if you  are not doing well or get worse.  Document Released: 02/05/2005 Document Revised: 10/18/2010 Document Reviewed: 09/26/2007 [facility] Patient Information 2012 [place], [place].

## 2011-03-27 NOTE — Progress Notes (Signed)
  Subjective:    Patient ID: Lynn Harvey, female    DOB: 25-Apr-1965, 46 y.o.   MRN: [id number]  HPI [name] is a new patient to me coming in today for evaluation of possible HTN.  She states that since September, 2 family members have been practicing taking BP on her for schooling and have found consistent readings that are high (average 160/90).  Denies h/o HTN.  [name] states that she has had significant family stress for 1 year; mother passed from cancer in March, 25 yo son, Lynn Harvey, committed suicide in October, and 64 yo daughter is [redacted] weeks pregnant.  GYN has put her on Wellbutrin, which has been helpful.  Insomnia has worsened and takes Nyquil qhs to sleep. Has had trazodone in past but made her feel awful.    Aside from annual GYN care, [name] has not had consistent medical care.  Past Medical History  Diagnosis Date  . Hypertension   . Depression   . Insomnia    History  Smoking status  . Current Everyday Smoker -- 1.5 packs/day  . Types: Cigarettes  Smokeless tobacco  . Not on file        Review of Systems  Constitutional: Negative.   Respiratory: Negative for cough, chest tightness and shortness of breath.   Cardiovascular: Negative for chest pain, palpitations and leg swelling.  Musculoskeletal: Positive for back pain (has pinched nerves/sciatica).  Neurological: Negative for dizziness, numbness and headaches.  Psychiatric/Behavioral:       Depression Anxiety Insomnia          Objective:   Physical Exam  Constitutional: She appears well-developed and well-nourished.  Neck: Thyromegaly present.  Cardiovascular: Normal rate, regular rhythm, normal heart sounds and intact distal pulses.   Pulmonary/Chest: Effort normal and breath sounds normal.  Musculoskeletal: She exhibits no edema.     EKG [facility] reading (PRIMARY) by  Dr. Cleta Alberts NSR/normal EKG       Assessment & Plan:   1. HTN (hypertension)  CBC with Differential, Comprehensive  metabolic panel, Lipid panel, TSH, EKG 12-Lead               Start Metoprolol ER 50 1/2 po x 7 days; then 1 a day.         Depression  Insomnia    Continue Wellbutrin Will consider adding temporary sleep aid if metoprolol doesn't help her with her stress/sleep. Encouraged to consider decreasing smoking. Recheck 1 month Will need CPE in the near future.

## 2011-04-12 ENCOUNTER — Ambulatory Visit (INDEPENDENT_AMBULATORY_CARE_PROVIDER_SITE_OTHER): Payer: No Typology Code available for payment source | Admitting: Emergency Medicine

## 2011-04-12 ENCOUNTER — Ambulatory Visit: Payer: No Typology Code available for payment source

## 2011-04-12 VITALS — BP 130/80 | HR 99 | Temp 98.5°F | Resp 16 | Ht 62.4 in | Wt 136.8 lb

## 2011-04-12 DIAGNOSIS — J189 Pneumonia, unspecified organism: Secondary | ICD-10-CM

## 2011-04-12 DIAGNOSIS — J209 Acute bronchitis, unspecified: Secondary | ICD-10-CM

## 2011-04-12 DIAGNOSIS — R509 Fever, unspecified: Secondary | ICD-10-CM

## 2011-04-12 DIAGNOSIS — R05 Cough: Secondary | ICD-10-CM

## 2011-04-12 DIAGNOSIS — J159 Unspecified bacterial pneumonia: Secondary | ICD-10-CM

## 2011-04-12 DIAGNOSIS — F329 Major depressive disorder, single episode, unspecified: Secondary | ICD-10-CM

## 2011-04-12 LAB — POCT CBC
Granulocyte percent: 70.1 %G (ref 37–80)
HCT, POC: 44.6 % (ref 37.7–47.9)
Lymph, poc: 1.3 (ref 0.6–3.4)
MCH, POC: 32.4 pg — AB (ref 27–31.2)
MCHC: 33.2 g/dL (ref 31.8–35.4)
MCV: 97.7 fL — AB (ref 80–97)
MID (cbc): 0.4 (ref 0–0.9)
POC LYMPH PERCENT: 22.8 %L (ref 10–50)
Platelet Count, POC: 172 10*3/uL (ref 142–424)
RDW, POC: 14 %
WBC: 5.6 10*3/uL (ref 4.6–10.2)

## 2011-04-12 LAB — POCT INFLUENZA A/B
Influenza A, POC: NEGATIVE
Influenza B, POC: NEGATIVE

## 2011-04-12 MED ORDER — AZITHROMYCIN 250 MG PO TABS: ORAL_TABLET | ORAL | Status: AC

## 2011-04-12 MED ORDER — HYDROCODONE-HOMATROPINE 5-1.5 MG/5ML PO SYRP: 5.0000 mL | ORAL_SOLUTION | Freq: Three times a day (TID) | ORAL | Status: AC | PRN

## 2011-04-12 NOTE — Progress Notes (Signed)
  Subjective:    Patient ID: Lynn Harvey, female    DOB: 04/10/1965, 46 y.o.   MRN: [id number]  Fever  This is a new problem. The current episode started in the past 7 days. The problem has been gradually worsening. Her temperature was unmeasured prior to arrival. Associated symptoms include congestion, coughing, ear pain, headaches, muscle aches and a sore throat.      Review of Systems  Constitutional: Positive for fever.  HENT: Positive for ear pain, congestion and sore throat.   Respiratory: Positive for cough.   Neurological: Positive for headaches.       Objective:   Physical Exam HEENT exam TMs are clear nose is very congested. With purulent drainage from the right nares. Her chest exam reveals some wheezes present in the bases with occasional rhonchi. Heart exam is unremarkable.  [facility] reading (PRIMARY) by  Dr.Billye Nydam chest x-ray reveals chronic changes in the bases. There appears to be on the lateral view a retrocardiac streaky infiltrate..        Assessment & Plan:   I suspect the patient has a flulike illness. She has an infiltrate present on the lateral on chest x-ray. Her white count is normal reflecting a probable underlying viral illness. We'll cover with antibiotics and cough medications.

## 2011-04-12 NOTE — Patient Instructions (Signed)

## 2011-04-24 ENCOUNTER — Ambulatory Visit (INDEPENDENT_AMBULATORY_CARE_PROVIDER_SITE_OTHER): Payer: No Typology Code available for payment source | Admitting: Physician Assistant

## 2011-04-24 ENCOUNTER — Encounter: Payer: Self-pay | Admitting: Physician Assistant

## 2011-04-24 VITALS — BP 119/70 | HR 77 | Temp 98.5°F | Resp 16 | Ht 63.5 in | Wt 136.0 lb

## 2011-04-24 DIAGNOSIS — F329 Major depressive disorder, single episode, unspecified: Secondary | ICD-10-CM

## 2011-04-24 DIAGNOSIS — I10 Essential (primary) hypertension: Secondary | ICD-10-CM

## 2011-04-24 DIAGNOSIS — R05 Cough: Secondary | ICD-10-CM

## 2011-04-24 MED ORDER — ALBUTEROL SULFATE HFA 108 (90 BASE) MCG/ACT IN AERS: 2.0000 | INHALATION_SPRAY | Freq: Four times a day (QID) | RESPIRATORY_TRACT | Status: DC | PRN

## 2011-04-24 NOTE — Progress Notes (Signed)
  Subjective:    Patient ID: Lynn Harvey, female    DOB: [birth year], 46 y.o.   MRN: [id number]  HPI Lynn Harvey is here for follow up HTN after starting metoprolol 50 mg 1 month ago.  She is feeling well and denies any SE from medication.  2 weeks ago she developed a cough and thought she had the flu but was dx with PNA.  She feels better from the illness but does say that she is SOB at times and wheezes occasionally.   Her depression is stable. Using Nyquil at night for sleep and 5 hour Energy Drink during the day.   Review of Systems  Constitutional: Negative for fever, chills and fatigue.  HENT: Negative for congestion and sore throat.   Respiratory: Positive for cough and wheezing.   Neurological: Negative for headaches.       Objective:   Physical Exam  Cardiovascular: Normal rate and regular rhythm.   Pulmonary/Chest: She has wheezes (LLL with harsh BS.).          Assessment & Plan:  HTN  Depression Post viral cough Wheezing Nicotine Addiction  Continue Metoprolol 50 mg.  May refill when calls.  Plans to schedule CPE within the next 2-3 months. Add ProAir inhaler today.  Use Mucinex DM. Encouraged her to not use 5 hour Energy drink as often.

## 2011-04-27 ENCOUNTER — Other Ambulatory Visit: Payer: Self-pay | Admitting: Family Medicine

## 2011-04-27 MED ORDER — METOPROLOL SUCCINATE ER 50 MG PO TB24: ORAL_TABLET | ORAL | Status: DC

## 2011-05-24 ENCOUNTER — Ambulatory Visit
Admission: RE | Admit: 2011-05-24 | Discharge: 2011-05-24 | Disposition: A | Discharge status: Home / Self Care | Payer: No Typology Code available for payment source | Source: Ambulatory Visit | Attending: Obstetrics and Gynecology | Admitting: Obstetrics and Gynecology

## 2011-05-24 DIAGNOSIS — Z1231 Encounter for screening mammogram for malignant neoplasm of breast: Secondary | ICD-10-CM

## 2011-11-26 ENCOUNTER — Ambulatory Visit (INDEPENDENT_AMBULATORY_CARE_PROVIDER_SITE_OTHER): Payer: No Typology Code available for payment source | Admitting: Physician Assistant

## 2011-11-26 VITALS — BP 138/68 | HR 86 | Temp 98.1°F | Resp 16 | Ht 64.0 in | Wt 140.0 lb

## 2011-11-26 DIAGNOSIS — F329 Major depressive disorder, single episode, unspecified: Secondary | ICD-10-CM

## 2011-11-26 DIAGNOSIS — R071 Chest pain on breathing: Secondary | ICD-10-CM

## 2011-11-26 MED ORDER — ALPRAZOLAM 0.5 MG PO TABS: 0.5000 mg | ORAL_TABLET | Freq: Two times a day (BID) | ORAL | Status: DC | PRN

## 2011-11-26 MED ORDER — BUPROPION HCL ER (XL) 150 MG PO TB24: 150.0000 mg | ORAL_TABLET | Freq: Every day | ORAL | Status: DC

## 2011-11-26 NOTE — Patient Instructions (Signed)
Call a counselor and schedule an appointment.

## 2011-11-26 NOTE — Progress Notes (Signed)
Subjective:    Patient ID: Lynn Harvey, female    DOB: 08/27/1965, 46 y.o.   MRN: [id number]  HPI This 46 y.o. female presents for evaluation of depression.  Her current regimen is not adequately controlling her symptoms.  She is approaching the one year anniversary of her mother's death after a 7 month fight with breast cancer AND her 29 year-old son's death by suicide.  His 27'th birthday would have been this week.  "I can't stop crying." She has an 72 year-old daughter and 16 year-old son.  Her current husband is no longer grieving (this was not his son) and they are struggling.  "I love my husband, but I hate him."  She [facility] with [facility] for a [facility], and her younger son continues there, but it's not a good fit for her.  She's increased her tobacco and alcohol use to self-treat her grief.  No SI/HI at present.  Drinks "5-Hour Energy drinks during the day, then takes an OTC sleep aid to go to sleep at night.  Her sister has recently been switched from Wellbutrin to Cymbalta, but she is not interested in trying that product.  Review of Systems  As above.  Past Medical History  Diagnosis Date  . Hypertension   . Depression   . Insomnia     Past Surgical History  Procedure Date  . Wrist surgery 1987    RIGHT; bone from forearm grafted to wrist    Prior to Admission medications   Medication Sig Start Date End Date Taking? Authorizing Provider  buPROPion (WELLBUTRIN XL) 300 MG 24 hr tablet  03/26/11  Yes Historical Provider, MD    No Known Allergies  History   Social History  . Marital Status: Married    Spouse Name: [name]    Number of Children: 3  . Years of Education: 12   Occupational History  . CUSTOMER SERVICE [employer]   Social History Main Topics  . Smoking status: Current Every Day Smoker -- 1.5 packs/day    Types: Cigarettes  . Smokeless tobacco: Never Used  . Alcohol Use: 21.0 oz/week    35 Cans of beer per week     more since fall 2012   . Drug Use: No  . Sexually Active: Yes -- Female partner(s)    Birth Control/ Protection: Surgical   Other Topics Concern  . Not on file   Social History Narrative  . No narrative on file    Family History  Problem Relation Age of Onset  . Cancer Mother   . COPD Mother     emphysema  . Heart disease Father 41    s/p 3V CABG  . Mental illness Sister     depression  . Mental illness Son     ADHD, bipolar disorder       Objective:   Physical Exam  Vitals reviewed. Constitutional: She is oriented to person, place, and time. Vital signs are normal. She appears well-developed and well-nourished. She is active.       Crying.  HENT:  Head: Normocephalic and atraumatic.  Eyes: Conjunctivae normal are normal. No scleral icterus.  Cardiovascular: Normal rate.   Pulmonary/Chest: Effort normal.  Neurological: She is alert and oriented to person, place, and time.  Psychiatric: Her speech is normal and behavior is normal. Judgment and thought content normal. Thought content is not paranoid and not delusional. Cognition and memory are normal. She exhibits a depressed mood. She expresses no homicidal  and no suicidal ideation. She expresses no suicidal plans and no homicidal plans.          Assessment & Plan:   1. Depression  ALPRAZolam (XANAX) 0.5 MG tablet, buPROPion (WELLBUTRIN XL) 150 MG 24 hr tablet   She'll contact another counselor-I've given her several names and contact numbers.  RTC 3 weeks, sooner if needed.

## 2011-11-27 NOTE — Progress Notes (Signed)
Follow-up appt made with [name] for 10/31.

## 2011-12-20 ENCOUNTER — Encounter: Payer: Self-pay | Admitting: Physician Assistant

## 2011-12-20 ENCOUNTER — Ambulatory Visit (INDEPENDENT_AMBULATORY_CARE_PROVIDER_SITE_OTHER): Payer: No Typology Code available for payment source | Admitting: Physician Assistant

## 2011-12-20 VITALS — BP 142/90 | HR 77 | Temp 98.0°F | Resp 16 | Ht 64.0 in | Wt 141.0 lb

## 2011-12-20 DIAGNOSIS — Z23 Encounter for immunization: Secondary | ICD-10-CM

## 2011-12-20 DIAGNOSIS — F329 Major depressive disorder, single episode, unspecified: Secondary | ICD-10-CM

## 2011-12-20 MED ORDER — BUPROPION HCL ER (XL) 150 MG PO TB24: 450.0000 mg | ORAL_TABLET | Freq: Every day | ORAL | Status: DC

## 2011-12-20 MED ORDER — ALPRAZOLAM 0.5 MG PO TABS: 0.5000 mg | ORAL_TABLET | Freq: Two times a day (BID) | ORAL | Status: DC | PRN

## 2011-12-20 NOTE — Patient Instructions (Signed)
Schedule an appointment with a therapist at [facility].  If it doesn't work out, or you are not comfortable there, please let me know and I'll give you some additional names.

## 2011-12-21 ENCOUNTER — Encounter: Payer: Self-pay | Admitting: Physician Assistant
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
## 2012-01-31 ENCOUNTER — Ambulatory Visit (INDEPENDENT_AMBULATORY_CARE_PROVIDER_SITE_OTHER): Payer: No Typology Code available for payment source | Admitting: Physician Assistant

## 2012-01-31 ENCOUNTER — Encounter: Payer: Self-pay | Admitting: Physician Assistant

## 2012-01-31 VITALS — BP 120/72 | HR 96 | Temp 98.1°F | Resp 18 | Ht 64.0 in | Wt 140.0 lb

## 2012-01-31 DIAGNOSIS — F329 Major depressive disorder, single episode, unspecified: Secondary | ICD-10-CM

## 2012-01-31 DIAGNOSIS — F3289 Other specified depressive episodes: Secondary | ICD-10-CM

## 2012-01-31 DIAGNOSIS — N63 Unspecified lump in unspecified breast: Secondary | ICD-10-CM

## 2012-01-31 DIAGNOSIS — N6311 Unspecified lump in the right breast, upper outer quadrant: Secondary | ICD-10-CM

## 2012-01-31 DIAGNOSIS — R41840 Attention and concentration deficit: Secondary | ICD-10-CM

## 2012-01-31 MED ORDER — AMPHETAMINE-DEXTROAMPHET ER 10 MG PO CP24: 10.0000 mg | ORAL_CAPSULE | ORAL | Status: DC

## 2012-01-31 MED ORDER — ALPRAZOLAM 0.5 MG PO TABS: 0.5000 mg | ORAL_TABLET | Freq: Two times a day (BID) | ORAL | Status: DC | PRN

## 2012-01-31 NOTE — Progress Notes (Signed)
Subjective:    Patient ID: Lynn Harvey, female    DOB: Apr 20, 1965, 46 y.o.   MRN: [id number]  HPI This 46 y.o. female presents for evaluation of HTN and depression.  Overall, she's feeling better. Talked with Alycia Rossetti, her counselor, by phone and has an appointment 12/27.  Has joined two online suicide survivor support groups. No thoughts of self or other harm.  Has noticed that while her mood has improved, she's having more trouble concentrating and staying focused to complete tasks.  She initially thought that these symptoms were due to her grief, but they are worsening as she's feeling better.  2 of her three children, and a sibling, have ADD.  Last night felt a lump in the RIGHT breast.  Tender.  Seems smaller today. Mammogram 02/2011 was normal.  Past Medical History  Diagnosis Date  . Hypertension   . Depression   . Insomnia   . Gestational diabetes     Past Surgical History  Procedure Date  . Wrist surgery 1987    RIGHT; bone from forearm grafted to wrist    Prior to Admission medications   Medication Sig Start Date End Date Taking? Authorizing Provider  ALPRAZolam Prudy Feeler) 0.5 MG tablet Take 1 tablet (0.5 mg total) by mouth 2 (two) times daily as needed for sleep or anxiety. 12/20/11  Yes Neoma Uhrich S Lindalou Soltis, PA-C  buPROPion (WELLBUTRIN XL) 150 MG 24 hr tablet Take 3 tablets (450 mg total) by mouth daily. 12/20/11  Yes Dameisha Tschida S Cale Bethard, PA-C    No Known Allergies  History   Social History  . Marital Status: Married    Spouse Name: [name]    Number of Children: 3  . Years of Education: 12   Occupational History  . CUSTOMER SERVICE [employer]   Social History Main Topics  . Smoking status: Current Every Day Smoker -- 1.5 packs/day    Types: Cigarettes    Start date: 12/25/1982  . Smokeless tobacco: Never Used  . Alcohol Use: 21.0 oz/week    35 Cans of beer per week     Comment: more since fall 2012  . Drug Use: No  . Sexually Active: Yes -- Female  partner(s)    Birth Control/ Protection: Surgical   Family History  Problem Relation Age of Onset  . Cancer Mother   . COPD Mother     emphysema  . Heart disease Father 57    s/p 3V CABG  . Mental illness Sister     depression  . Mental illness Son     ADHD, bipolar disorder    Review of Systems No chest pain, SOB, HA, dizziness, vision change, N/V, diarrhea, constipation, dysuria, urinary urgency or frequency, myalgias, arthralgias or rash.     Objective:   Physical Exam Blood pressure 120/72, pulse 96, temperature 98.1 F (36.7 C), resp. rate 18, height 5\' 4"  (1.626 m), weight 140 lb (63.504 kg). Body mass index is 24.03 kg/(m^2). Well-developed, well nourished WF who is awake, alert and oriented, in NAD. HEENT: /AT, PERRL, EOMI.  Sclera and conjunctiva are clear.  EAC are patent, TMs are normal in appearance. Nasal mucosa is pink and moist. OP is clear. Neck: supple, non-tender, no lymphadenopathy, thyromegaly. Heart: RRR, no murmur Lungs: normal effort, CTA Breast:  Breasts are dense bilaterally in the upper our quadrants, but significantly more so on the RIGHT.  Seems to be a discrete mass, mildly tender, at 10 o'clock within a horizontally linear area of tissue denser  than it's surroundings. Abdomen: normo-active bowel sounds, supple, non-tender, no mass or organomegaly. Extremities: no cyanosis, clubbing or edema. Skin: warm and dry without rash. Psychologic: good mood and appropriate affect, normal speech and behavior.  She completed a questionnaire on adult ADD, rating most of the symptoms as frequent or very frequent.     Assessment & Plan:   1. Depression -improving ALPRAZolam (XANAX) 0.5 MG tablet; continue Wellbutrin XL and counseling.  2. Breast lump on right side at 10 o'clock position  US Breast Right; suspect cyst, given tenderness and normal mammogram 11 months ago.  3. Attention and concentration deficit -suspect ADD amphetamine-dextroamphetamine  (ADDERALL XR) 10 MG 24 hr capsule; RTC 4 weeks   Encouraged smoking cessation.

## 2012-02-02 ENCOUNTER — Other Ambulatory Visit: Payer: Self-pay | Admitting: Physician Assistant

## 2012-02-02 DIAGNOSIS — N6310 Unspecified lump in the right breast, unspecified quadrant: Secondary | ICD-10-CM

## 2012-02-14 ENCOUNTER — Ambulatory Visit
Admission: RE | Admit: 2012-02-14 | Discharge: 2012-02-14 | Disposition: A | Discharge status: Home / Self Care | Payer: No Typology Code available for payment source | Source: Ambulatory Visit | Attending: Physician Assistant | Admitting: Physician Assistant

## 2012-02-14 DIAGNOSIS — N6311 Unspecified lump in the right breast, upper outer quadrant: Secondary | ICD-10-CM

## 2012-02-14 DIAGNOSIS — N6310 Unspecified lump in the right breast, unspecified quadrant: Secondary | ICD-10-CM

## 2012-02-28 ENCOUNTER — Ambulatory Visit: Payer: No Typology Code available for payment source | Admitting: Physician Assistant

## 2012-02-29 ENCOUNTER — Telehealth: Payer: Self-pay

## 2012-02-29 DIAGNOSIS — R41840 Attention and concentration deficit: Secondary | ICD-10-CM

## 2012-02-29 MED ORDER — AMPHETAMINE-DEXTROAMPHET ER 10 MG PO CP24: 10.0000 mg | ORAL_CAPSULE | ORAL | Status: DC

## 2012-02-29 NOTE — Telephone Encounter (Signed)
Rx printed.  I'll plan to sign it tomorrow morning when I get there.

## 2012-02-29 NOTE — Telephone Encounter (Signed)
Requesting written script for a few amphetamine-dextroamphetamine (ADDERALL XR) 10 MG 24 hr capsule till her appointment with [name] next week.  [id number]

## 2012-03-02 NOTE — Telephone Encounter (Signed)
lmom that rx is ready for pickup.  

## 2012-03-06 ENCOUNTER — Ambulatory Visit (INDEPENDENT_AMBULATORY_CARE_PROVIDER_SITE_OTHER): Payer: BC Managed Care – PPO | Admitting: Physician Assistant

## 2012-03-06 ENCOUNTER — Encounter: Payer: Self-pay | Admitting: Physician Assistant

## 2012-03-06 VITALS — BP 140/85 | HR 96 | Temp 97.4°F | Resp 16 | Ht 63.0 in | Wt 139.0 lb

## 2012-03-06 DIAGNOSIS — F329 Major depressive disorder, single episode, unspecified: Secondary | ICD-10-CM

## 2012-03-06 DIAGNOSIS — F3289 Other specified depressive episodes: Secondary | ICD-10-CM

## 2012-03-06 DIAGNOSIS — F988 Other specified behavioral and emotional disorders with onset usually occurring in childhood and adolescence: Secondary | ICD-10-CM

## 2012-03-06 MED ORDER — AMPHETAMINE-DEXTROAMPHET ER 10 MG PO CP24: 10.0000 mg | ORAL_CAPSULE | ORAL | Status: DC

## 2012-03-06 MED ORDER — ALPRAZOLAM 0.5 MG PO TABS: 0.5000 mg | ORAL_TABLET | Freq: Two times a day (BID) | ORAL | Status: DC | PRN
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
## 2012-03-06 NOTE — Patient Instructions (Signed)
Call your counselor back and reschedule the appointment!

## 2012-04-12 ENCOUNTER — Ambulatory Visit (INDEPENDENT_AMBULATORY_CARE_PROVIDER_SITE_OTHER): Payer: BC Managed Care – PPO | Admitting: Family Medicine

## 2012-04-12 VITALS — BP 157/85 | HR 110 | Temp 97.6°F | Resp 17 | Ht 63.5 in | Wt 138.0 lb

## 2012-04-12 DIAGNOSIS — I10 Essential (primary) hypertension: Secondary | ICD-10-CM

## 2012-04-12 DIAGNOSIS — R091 Pleurisy: Secondary | ICD-10-CM

## 2012-04-12 MED ORDER — AMOXICILLIN 875 MG PO TABS: 875.0000 mg | ORAL_TABLET | Freq: Two times a day (BID) | ORAL | Status: DC

## 2012-04-12 MED ORDER — BENZONATATE 200 MG PO CAPS: 200.0000 mg | ORAL_CAPSULE | Freq: Three times a day (TID) | ORAL | Status: DC | PRN

## 2012-04-12 MED ORDER — PREDNISONE 10 MG PO TABS: ORAL_TABLET | ORAL | Status: DC

## 2012-04-12 MED ORDER — IPRATROPIUM BROMIDE 0.03 % NA SOLN: 2.0000 | Freq: Two times a day (BID) | NASAL | Status: DC

## 2012-04-12 NOTE — Progress Notes (Addendum)
Subjective:    Patient ID: Lynn Harvey, female    DOB: 11/20/1965, 47 y.o.   MRN: [id number] Chief Complaint  Patient presents with  . Sinusitis  . Chest Pain    HPI  Feels like a sinus infection and now chest is hurting, coughing and sneezing.  Sxs started 4d ago with URI type sxs of congestion, ear pain, HAs, sinus pain, no hearing loss or ear drainage.  No fever or chills. No gum or dental pain - does not have any teeth.  No myaglias/arthralgias, does have some sore throat and nauseas from all the PND.  Has been taking mucinex sinus.  No sinus rinse or netti pot. Did get flu shot this yr.  Now sxds have moved down into lungs and started coughing yesterday - cough productive of some loose phlegm. No SHoB, no DOE.  Pain in central chest with breathing - esp exhaling. No pain with coughing. Pain is constant.  No weakness, edema, palpitatoins or syncope. Is really tired from illness. Smoking 1 1/2 to 2 ppd. History   Social History  . Marital Status: Married    Spouse Name: [name]    Number of Children: 3  . Years of Education: 12   Occupational History  . CUSTOMER SERVICE [employer]   Social History Main Topics  . Smoking status: Current Every Day Smoker -- 2.00 packs/day for 20 years    Types: Cigarettes    Start date: 12/25/1982  . Smokeless tobacco: Never Used  . Alcohol Use: 21.0 oz/week    35 Cans of beer per week     Comment: more since fall 2012  . Drug Use: No  . Sexually Active: Yes -- Female partner(s)    Birth Control/ Protection: Surgical   Other Topics Concern  . None   Social History Narrative   Lives with her husband, their son [name], and her daughter [name] and Amber's son [name].   Review of Systems  Constitutional: Positive for fatigue. Negative for fever, chills, diaphoresis, activity change and appetite change.  HENT: Positive for ear pain, congestion, sore throat, rhinorrhea, sneezing, postnasal drip and sinus pressure. Negative for  nosebleeds, trouble swallowing, neck pain, neck stiffness, voice change and ear discharge.   Eyes: Negative for discharge and itching.  Respiratory: Positive for cough and chest tightness. Negative for shortness of breath and wheezing.   Cardiovascular: Positive for chest pain. Negative for palpitations and leg swelling.  Gastrointestinal: Positive for nausea. Negative for vomiting and abdominal pain.  Skin: Negative for rash.  Neurological: Positive for headaches. Negative for dizziness, syncope and weakness.  Hematological: Positive for adenopathy.  Psychiatric/Behavioral: Positive for sleep disturbance. The patient is nervous/anxious.       BP 157/85  Pulse 110  Temp(Src) 97.6 F (36.4 C) (Oral)  Resp 17  Ht 5' 3.5" (1.613 m)  Wt 138 lb (62.596 kg)  BMI 24.06 kg/m2  SpO2 98% Objective:   Physical Exam  Constitutional: She is oriented to person, place, and time. She appears well-developed and well-nourished. She appears lethargic. She appears ill. No distress.  HENT:  Head: Normocephalic and atraumatic.  Right Ear: External ear and ear canal normal. Tympanic membrane is retracted. A middle ear effusion is present.  Left Ear: External ear and ear canal normal. Tympanic membrane is retracted. A middle ear effusion is present.  Nose: Mucosal edema and rhinorrhea present. Right sinus exhibits maxillary sinus tenderness. Left sinus exhibits maxillary sinus tenderness.  Mouth/Throat: Uvula is midline and mucous  membranes are normal. Posterior oropharyngeal erythema present. No oropharyngeal exudate, posterior oropharyngeal edema or tonsillar abscesses.  Eyes: Conjunctivae are normal. Right eye exhibits no discharge. Left eye exhibits no discharge. No scleral icterus.  Neck: Normal range of motion. Neck supple.  Cardiovascular: Normal rate, regular rhythm, normal heart sounds and intact distal pulses.   Pulmonary/Chest: Effort normal and breath sounds normal. No respiratory distress. She  has no wheezes. She has no rales. She exhibits no tenderness.  Lymphadenopathy:       Head (right side): Submandibular adenopathy present. No preauricular and no posterior auricular adenopathy present.       Head (left side): Submandibular adenopathy present. No preauricular and no posterior auricular adenopathy present.    She has no cervical adenopathy.       Right: No supraclavicular adenopathy present.       Left: No supraclavicular adenopathy present.  Neurological: She is oriented to person, place, and time. She appears lethargic.  Skin: Skin is warm and dry. She is not diaphoretic. No erythema.  Psychiatric: She has a normal mood and affect. Her behavior is normal.       Assessment & Plan:  HTN (hypertension) - BP on recheck getting progressively higher. Pt attributes to anxiety. Pt has BP cuff at home and she will check BP 1-2x/d and call in readings within the next sev days. Stop otc cough/cold meds.  Sinusitis, acute maxillary - prednisone and maoix with prn tessalon and atrovent - see pt instructions.  Anxiety state, unspecified - Pt very anxious as she is running late to pick up her granddaughter  Pleurisy - I suspect pt's pain is pleurisy - it would be atypical for cardiac pain but I still rec to pt that we get an EKG which she declines since she is running late. Also can't r/o PE due to elev HR but likely due to otc cough/cold meds pt has been taking - she is instructed to stop  Will treat with prednisone and pt will RTC if pain cont.  Tobacco abuse  Meds ordered this encounter  Medications  . predniSONE (DELTASONE) 10 MG tablet    Sig: 6 tabs po x 1 now, 5 tabs po x1 day 2, 4 tabs x 1 day 3, 3 tabs day 4, 2 tabs day 5, 1 tab day 6    Dispense:  21 tablet    Refill:  0  . amoxicillin (AMOXIL) 875 MG tablet    Sig: Take 1 tablet (875 mg total) by mouth 2 (two) times daily.    Dispense:  20 tablet    Refill:  0  . ipratropium (ATROVENT) 0.03 % nasal spray    Sig: Place  2 sprays into the nose 2 (two) times daily.    Dispense:  30 mL    Refill:  1  . benzonatate (TESSALON) 200 MG capsule    Sig: Take 1 capsule (200 mg total) by mouth 3 (three) times daily as needed for cough.    Dispense:  30 capsule    Refill:  0

## 2012-04-12 NOTE — Patient Instructions (Addendum)
Hot showers or breathing in steam may help loosen the congestion.  Using a netti pot or sinus rinse is also likely to help you feel better and keep this from progressing.  Use the atrovent nasal spray as needed throughout the day. Plain generic mucinex can help you move out the congestion but do not use sudafed or any medication with pseudoephedrine or phenylephrine in it as it can increase your blood pressure and heart rate.  If no improvement or you are getting worse, come back.  Pleurisy Pleurisy is an inflammation and swelling of the lining of the lungs. It usually is the result of an underlying infection or other disease. Because of this inflammation, it hurts to breathe. It is aggravated by coughing or deep breathing. The primary goal in treating pleurisy is to diagnose and treat the condition that caused it.  HOME CARE INSTRUCTIONS   Only take over-the-counter or prescription medicines for pain, discomfort, or fever as directed by your caregiver.  If medications which kill germs (antibiotics) were prescribed, take the entire course. Even if you are feeling better, you need to take them.  Use a cool mist vaporizer to help loosen secretions. This is so the secretions can be coughed up more easily. SEEK MEDICAL CARE IF:   Your pain is not controlled with medication or is increasing.  You have an increase inpus like (purulent) secretions brought up with coughing. SEEK IMMEDIATE MEDICAL CARE IF:   You have blue or dark lips, fingernails, or toenails.  You begin coughing up blood.  You have increased difficulty breathing.  You have continuing pain unrelieved by medicine or lasting more than 1 week.  You have pain that radiates into your neck, arms, or jaw.  You develop increased shortness of breath or wheezing.  You develop a fever, rash, vomiting, fainting, or other serious complaints. Document Released: 02/05/2005 Document Revised: 04/30/2011 Document Reviewed: 09/06/2006 [facility]  Patient Information 2013 [place], [place].   Sinusitis Sinusitis is redness, soreness, and swelling (inflammation) of the paranasal sinuses. Paranasal sinuses are air pockets within the bones of your face (beneath the eyes, the middle of the forehead, or above the eyes). In healthy paranasal sinuses, mucus is able to drain out, and air is able to circulate through them by way of your nose. However, when your paranasal sinuses are inflamed, mucus and air can become trapped. This can allow bacteria and other germs to grow and cause infection. Sinusitis can develop quickly and last only a short time (acute) or continue over a long period (chronic). Sinusitis that lasts for more than 12 weeks is considered chronic.  CAUSES  Causes of sinusitis include:  Allergies.  Structural abnormalities, such as displacement of the cartilage that separates your nostrils (deviated septum), which can decrease the air flow through your nose and sinuses and affect sinus drainage.  Functional abnormalities, such as when the small hairs (cilia) that line your sinuses and help remove mucus do not work properly or are not present. SYMPTOMS  Symptoms of acute and chronic sinusitis are the same. The primary symptoms are pain and pressure around the affected sinuses. Other symptoms include:  Upper toothache.  Earache.  Headache.  Bad breath.  Decreased sense of smell and taste.  A cough, which worsens when you are lying flat.  Fatigue.  Fever.  Thick drainage from your nose, which often is green and may contain pus (purulent).  Swelling and warmth over the affected sinuses. DIAGNOSIS  Your caregiver will perform a physical exam.  During the exam, your caregiver may:  Look in your nose for signs of abnormal growths in your nostrils (nasal polyps).  Tap over the affected sinus to check for signs of infection.  View the inside of your sinuses (endoscopy) with a special imaging device with a light attached  (endoscope), which is inserted into your sinuses. If your caregiver suspects that you have chronic sinusitis, one or more of the following tests may be recommended:  Allergy tests.  Nasal culture A sample of mucus is taken from your nose and sent to a lab and screened for bacteria.  Nasal cytology A sample of mucus is taken from your nose and examined by your caregiver to determine if your sinusitis is related to an allergy. TREATMENT  Most cases of acute sinusitis are related to a viral infection and will resolve on their own within 10 days. Sometimes medicines are prescribed to help relieve symptoms (pain medicine, decongestants, nasal steroid sprays, or saline sprays).  However, for sinusitis related to a bacterial infection, your caregiver will prescribe antibiotic medicines. These are medicines that will help kill the bacteria causing the infection.  Rarely, sinusitis is caused by a fungal infection. In theses cases, your caregiver will prescribe antifungal medicine. For some cases of chronic sinusitis, surgery is needed. Generally, these are cases in which sinusitis recurs more than 3 times per year, despite other treatments. HOME CARE INSTRUCTIONS   Drink plenty of water. Water helps thin the mucus so your sinuses can drain more easily.  Use a humidifier.  Inhale steam 3 to 4 times a day (for example, sit in the bathroom with the shower running).  Apply a warm, moist washcloth to your face 3 to 4 times a day, or as directed by your caregiver.  Use saline nasal sprays to help moisten and clean your sinuses.  Take over-the-counter or prescription medicines for pain, discomfort, or fever only as directed by your caregiver. SEEK IMMEDIATE MEDICAL CARE IF:  You have increasing pain or severe headaches.  You have nausea, vomiting, or drowsiness.  You have swelling around your face.  You have vision problems.  You have a stiff neck.  You have difficulty breathing. MAKE SURE  YOU:   Understand these instructions.  Will watch your condition.  Will get help right away if you are not doing well or get worse. Document Released: 02/05/2005 Document Revised: 04/30/2011 Document Reviewed: 02/20/2011 [facility] Patient Information 2013 [place], [place].

## 2012-04-14 ENCOUNTER — Ambulatory Visit (INDEPENDENT_AMBULATORY_CARE_PROVIDER_SITE_OTHER): Payer: BC Managed Care – PPO | Admitting: Physician Assistant

## 2012-04-14 ENCOUNTER — Telehealth: Payer: Self-pay

## 2012-04-14 VITALS — BP 159/82 | HR 89 | Temp 97.6°F | Resp 20 | Ht 63.0 in | Wt 139.0 lb

## 2012-04-14 DIAGNOSIS — I10 Essential (primary) hypertension: Secondary | ICD-10-CM

## 2012-04-14 NOTE — Progress Notes (Signed)
Subjective:    Patient ID: Lynn Harvey, female    DOB: October 20, 1965, 47 y.o.   MRN: [id number]  HPI This 47 y.o. female presents for evaluation of elevated BP.  She was here 04/12/12 with a URI/sinusitis and her BP was elevated.  She was in a rush after a long wait here, and was advised to RTC for re-evaluation.  She did not start the medications prescribed, but used some prednisone she had at home leftover from a previous prescription.  She filled the other meds today and plans to take them.  Her home BP since her visit have ranged 129-175/87-110.  Her BP at her last routine visit 0n 1/16) was 140/85.  No CP, SOB, HA, dizziness.  She is still quite congested and is coughing.  The stress at home persists.  Her grandson's paternity was finally discovered and confirmed (the 3rd man tested), and the father is now actively helping/participating in parenting.  The patient's husband is still not accepting her daughter (he is not Lynn Harvey's father), and is Dietitian, and their son, Lynn Harvey, names that are vulgar and inappropriate.  She has given him until April to make a change or move out.  She continues to smoke, though has purchased a new e-cigarette to help her cut back.  Past Medical History  Diagnosis Date  . Hypertension   . Depression   . Insomnia   . Gestational diabetes   . Allergy   . Anxiety     Past Surgical History  Procedure Laterality Date  . Wrist surgery  1987    RIGHT; bone from forearm grafted to wrist  . Abdominal hysterectomy    . Tonsilectomy, adenoidectomy, bilateral myringotomy and tubes    . Colon surgery      Prior to Admission medications   Medication Sig Start Date End Date Taking? Authorizing Provider  ALPRAZolam Prudy Feeler) 0.5 MG tablet Take 1 tablet (0.5 mg total) by mouth 2 (two) times daily as needed for sleep or anxiety. 03/06/12  Yes Dariela Stoker S Beaumont Austad, PA-C  amphetamine-dextroamphetamine (ADDERALL XR) 10 MG 24 hr capsule Take 1 capsule (10 mg total) by mouth  every morning. 03/06/12  Yes Deiondra Denley S Oluwatobi Ruppe, PA-C  amphetamine-dextroamphetamine (ADDERALL XR) 10 MG 24 hr capsule Take 1 capsule (10 mg total) by mouth every morning. May fill on/after 04/05/2012 03/06/12  Yes Irish Breisch S Nikkita Adeyemi, PA-C  amphetamine-dextroamphetamine (ADDERALL XR) 10 MG 24 hr capsule Take 1 capsule (10 mg total) by mouth every morning. May fill on/after 05/02/2012 03/06/12  Yes Kepler Mccabe S Atilla Zollner, PA-C  buPROPion (WELLBUTRIN XL) 150 MG 24 hr tablet Take 3 tablets (450 mg total) by mouth daily. 12/20/11  Yes Sayvon Arterberry S Roseland Braun, PA-C  ipratropium (ATROVENT) 0.03 % nasal spray Place 2 sprays into the nose 2 (two) times daily. 04/12/12  Yes Sherren Mocha, MD    No Known Allergies  History   Social History  . Marital Status: Married    Spouse Name: [name]    Number of Children: 3  . Years of Education: 12   Occupational History  . CUSTOMER SERVICE [employer]   Social History Main Topics  . Smoking status: Current Every Day Smoker -- 2.00 packs/day for 20 years    Types: Cigarettes    Start date: 12/25/1982  . Smokeless tobacco: Never Used  . Alcohol Use: 21.0 oz/week    35 Cans of beer per week     Comment: more since fall 2012  . Drug Use: No  .  Sexually Active: Yes -- Female partner(s)    Birth Control/ Protection: Surgical   Other Topics Concern  . Not on file   Social History Narrative   Lives with her husband, their son Lynn Harvey, and her daughter Lynn Harvey and Lynn Harvey's son Lynn Harvey.    Family History  Problem Relation Age of Onset  . Cancer Mother   . COPD Mother     emphysema  . Heart disease Father 38    s/p 3V CABG  . Mental illness Sister     depression  . Mental illness Son     ADHD, bipolar disorder  . Diabetes Maternal Grandmother   . Heart disease Maternal Grandmother   . Diabetes Maternal Grandfather   . Cancer Maternal Grandfather     lung cancer  . Diabetes Paternal Grandmother   . Kidney disease Paternal Grandmother   . Diabetes Paternal  Grandfather   . Heart disease Paternal Grandfather     Review of Systems As above.    Objective:   Physical Exam Blood pressure 159/82, pulse 89, temperature 97.6 F (36.4 C), temperature source Oral, resp. rate 20, height 5\' 3"  (1.6 m), weight 139 lb (63.05 kg), SpO2 97.00%. Body mass index is 24.63 kg/(m^2). Well-developed, well nourished WF who is awake, alert and oriented, in NAD. HEENT: [place]/AT, sclera and conjunctiva are clear.  EAC are patent, TMs are normal in appearance. Nasal mucosa is congested, pink and moist. OP is clear. Neck: supple, non-tender, no lymphadenopathy, thyromegaly. Heart: RRR, no murmur Lungs: normal effort, CTA Extremities: no cyanosis, clubbing or edema. Skin: warm and dry without rash. Psychologic: good mood and appropriate affect, normal speech and behavior.        Assessment & Plan:  HTN (hypertension)  Sinusitis, acute maxillary  Tobacco abuse  Start the medications given for her sinusitis.  Advised that the prednisone will likely keep her BP elevated temporarily.  Once she is well, we'll reassess her BP and add medication if indicated.

## 2012-04-14 NOTE — Patient Instructions (Signed)
Get plenty of rest and drink at least 64 ounces of water daily. Take the medications that you've picked up. Check your blood pressure once you're well.  It should be below 140/90.  Let me know if it's above that consistently.

## 2012-04-14 NOTE — Telephone Encounter (Signed)
Patient was in our office on Saturday and was giving a call back with blood pressure readings.  Saturday night: 161/98, 160/91, 158/87 Sunday: 149/89 Monday: 189/114, 173/108, 175/110  She would like a call back at [phone]

## 2012-04-14 NOTE — Telephone Encounter (Signed)
Called patient to advise, she states she will come in now.

## 2012-04-14 NOTE — Telephone Encounter (Signed)
Her blood pressure is MUCH to high.  She needs to come in for a visit ASAP - hopefully today so that we can ensure her chest pain has resolved, consider running a EKG - and she needs labs as well as likely will need to start on a BP med.  When she was seen on Sat for a sinus infection her BP was very high and she was having chest pain but she refused work-up or treatment as she felt sure it was due to her anxiety of needing to go pick up her grandchild and running late due to the wait at the office but since her elev BP is clearly continuing and apparently worsening, she needs to come back for eval NOW.

## 2012-04-17 ENCOUNTER — Ambulatory Visit (INDEPENDENT_AMBULATORY_CARE_PROVIDER_SITE_OTHER): Payer: BC Managed Care – PPO | Admitting: Physician Assistant

## 2012-04-17 VITALS — BP 150/80 | HR 112 | Temp 98.0°F | Resp 18 | Wt 139.0 lb

## 2012-04-17 DIAGNOSIS — R059 Cough, unspecified: Secondary | ICD-10-CM

## 2012-04-17 DIAGNOSIS — R030 Elevated blood-pressure reading, without diagnosis of hypertension: Secondary | ICD-10-CM

## 2012-04-17 DIAGNOSIS — J019 Acute sinusitis, unspecified: Secondary | ICD-10-CM

## 2012-04-17 MED ORDER — HYDROCOD POLST-CHLORPHEN POLST 10-8 MG/5ML PO LQCR: 5.0000 mL | Freq: Two times a day (BID) | ORAL | Status: DC

## 2012-04-17 MED ORDER — AMOXICILLIN-POT CLAVULANATE 875-125 MG PO TABS: 1.0000 | ORAL_TABLET | Freq: Two times a day (BID) | ORAL | Status: DC

## 2012-04-17 MED ORDER — FLUTICASONE PROPIONATE 50 MCG/ACT NA SUSP: 2.0000 | Freq: Every day | NASAL | Status: DC

## 2012-04-17 MED ORDER — GUAIFENESIN ER 1200 MG PO TB12: 1.0000 | ORAL_TABLET | Freq: Two times a day (BID) | ORAL | Status: DC

## 2012-04-17 NOTE — Progress Notes (Signed)
   [address], [place] [address]   Phone [phone]  Subjective:    Patient ID: Lynn Harvey, female    DOB: Jul 07, 1965, 46 y.o.   MRN: [id number]  HPI Pt presents to clinic with continued sinus congestion and cough.  She has been taking Amoxil for the last 5 days and the sinus pressure and R sided face pain has continued to get worse.  She is having increased R ear pain and feels terrible.  Her cough is better but it is keeping her up at night - it is dry and feels like it is from the PND.  The tessalon perles is helping during the day but not helping at all at night.  Her chest discomfort has resolved.  She has been monitoring her BP and it is still running 150s/80-90s.  She decided to not take the prednisone because her mother was on it and she is scared of the side effects.   Review of Systems  Constitutional: Negative for fever and chills.  HENT: Positive for ear pain (L ear only), congestion, sore throat (PND irritated), rhinorrhea (minimal) and postnasal drip.   Respiratory: Positive for cough (dry). Negative for shortness of breath and wheezing.   Gastrointestinal: Negative for nausea.  Neurological: Positive for headaches.       Objective:   Physical Exam  Vitals reviewed. Constitutional: She is oriented to person, place, and time. She appears well-developed and well-nourished.  HENT:  Head: Normocephalic and atraumatic.  Right Ear: Hearing, tympanic membrane, external ear and ear canal normal.  Left Ear: Hearing, external ear and ear canal normal. Tympanic membrane is bulging (purulent material behind TM). Tympanic membrane is not erythematous.  Nose: Mucosal edema (red) present.  Mouth/Throat: Uvula is midline, oropharynx is clear and moist and mucous membranes are normal.  Eyes: Conjunctivae are normal.  Neck: Neck supple.  Cardiovascular: Normal rate, regular rhythm and normal heart sounds.   Pulmonary/Chest: Effort normal and breath sounds normal.    Lymphadenopathy:    She has no cervical adenopathy.  Neurological: She is alert and oriented to person, place, and time.  Skin: Skin is warm and dry.  Psychiatric: She has a normal mood and affect. Her behavior is normal. Judgment and thought content normal.        Assessment & Plan:  Sinusitis, acute - I am worried that she is not improved after 5 days of amoxil and with an early ear infection in the l ear.  Pt will continue her atrovent and d/c her amoxil.  Plan: fluticasone (FLONASE) 50 MCG/ACT nasal spray, amoxicillin-clavulanate (AUGMENTIN) 875-125 MG per tablet, Guaifenesin (MUCINEX MAXIMUM STRENGTH) 1200 MG TB12  Cough - Plan: chlorpheniramine-HYDROcodone (TUSSIONEX PENNKINETIC ER) 10-8 MG/5ML LQCR  Elevated BP - pt to monitor and if in 2 weeks she is getting readings still <140/90 she will RTC for HTN medication initiation.   Pt to push fluids.

## 2012-05-12 ENCOUNTER — Other Ambulatory Visit: Payer: Self-pay | Admitting: Physician Assistant

## 2012-06-10 ENCOUNTER — Encounter: Payer: Self-pay | Admitting: Physician Assistant

## 2012-06-10 ENCOUNTER — Ambulatory Visit (INDEPENDENT_AMBULATORY_CARE_PROVIDER_SITE_OTHER): Payer: BC Managed Care – PPO | Admitting: Physician Assistant

## 2012-06-10 VITALS — BP 155/91 | HR 87 | Temp 97.8°F | Resp 18 | Ht 63.0 in | Wt 136.0 lb

## 2012-06-10 DIAGNOSIS — F3289 Other specified depressive episodes: Secondary | ICD-10-CM

## 2012-06-10 DIAGNOSIS — I10 Essential (primary) hypertension: Secondary | ICD-10-CM

## 2012-06-10 DIAGNOSIS — R41840 Attention and concentration deficit: Secondary | ICD-10-CM

## 2012-06-10 DIAGNOSIS — F329 Major depressive disorder, single episode, unspecified: Secondary | ICD-10-CM

## 2012-06-10 DIAGNOSIS — Z720 Tobacco use: Secondary | ICD-10-CM | POA: Insufficient documentation

## 2012-06-10 DIAGNOSIS — F17200 Nicotine dependence, unspecified, uncomplicated: Secondary | ICD-10-CM

## 2012-06-10 DIAGNOSIS — F411 Generalized anxiety disorder: Secondary | ICD-10-CM

## 2012-06-10 DIAGNOSIS — E785 Hyperlipidemia, unspecified: Secondary | ICD-10-CM

## 2012-06-10 DIAGNOSIS — J309 Allergic rhinitis, unspecified: Secondary | ICD-10-CM

## 2012-06-10 LAB — LIPID PANEL
Cholesterol: 229 mg/dL — ABNORMAL HIGH (ref 0–200)
Triglycerides: 263 mg/dL — ABNORMAL HIGH (ref <150)

## 2012-06-10 LAB — COMPREHENSIVE METABOLIC PANEL
ALT: 21 U/L (ref 0–35)
Albumin: 4.1 g/dL (ref 3.5–5.2)
CO2: 28 mEq/L (ref 19–32)
Calcium: 9.6 mg/dL (ref 8.4–10.5)
Chloride: 104 mEq/L (ref 96–112)
Glucose, Bld: 98 mg/dL (ref 70–99)
Potassium: 4.8 mEq/L (ref 3.5–5.3)
Sodium: 138 mEq/L (ref 135–145)
Total Bilirubin: 0.6 mg/dL (ref 0.3–1.2)
Total Protein: 6.9 g/dL (ref 6.0–8.3)

## 2012-06-10 NOTE — Progress Notes (Signed)
  Subjective:    Patient ID: Lynn Harvey, female    DOB: [birth year], 47 y.o.   MRN: [id number]  HPI This 47 y.o. female presents for evaluation of hyperlipidemia and elevated BP. She is fasting today for repeat lipid profile, and is no longer taking prednisone for sinusitis.  She continues to have considerable sinus congestion and drainage. She did not find the Atrovent nasal spray or Flonase nasal spray helpful at all.  She's not interested in trying another brand of steroid nasal spray or an antihistamine nasal spray, nor singular.  She is aware that quitting smoking will likely reduce her symptoms.  Stress is still quite high, especially at home. Her daughter has moved out, taking her infant son, [name].  Her other grandson, who is in [place], has been taken off ADHD meds and is currently being evaluated for an Autism Spectrum diagnosis.  Her son is happy to have his bedroom back (no longer sharing with his cousin), but her husband's attitude toward her children and grandchildren is still inconsistent and often very negative and verbally abusive.  She continues to use alcohol to help relax, but is down from 35 beers/week to 24.  She cut back on Wellbutrin from 450 mg to 300 mg, "because I don't want to be taking so much medicine" but also asks if the dose of Adderall can be increased to help improve concentration, focus, ability to shift focus.  Current allergies and medications reviewed.  Past medical, social and family histories reviewed.     Review of Systems As above. Denies chest pain, shortness of breath, HA, dizziness, vision change, nausea, vomiting, diarrhea, constipation, melena, hematochezia, dysuria, increased urinary urgency or frequency, increased hunger or thirst, unintentional weight change, unexplained myalgias or arthralgias, rash.     Objective:   Physical Exam Blood pressure 155/91, pulse 87, temperature 97.8 F (36.6 C), temperature source Oral, resp. rate 18,  height 5\' 3"  (1.6 m), weight 136 lb (61.689 kg), SpO2 98.00%. Body mass index is 24.1 kg/(m^2). Well-developed, well nourished WF who is awake, alert and oriented, in NAD. HEENT: [place]/AT, sclera and conjunctiva are clear.   Neck: supple, non-tender, no lymphadenopathy, thyromegaly. Heart: RRR, no murmur Lungs: normal effort, CTA Skin: warm and dry without rash. Psychologic: good mood and appropriate affect, normal speech and behavior.     Assessment & Plan:  Hyperlipidemia - Plan: Comprehensive metabolic panel, Lipid panel  HTN (hypertension) - Plan: Comprehensive metabolic panel  Allergic rhinitis  Attention and concentration deficit  Anxiety state, unspecified  Depression  Tobacco abuse  Patient Instructions  Try increasing the Wellbutrin back to 3 tabs (450 mg), and keep track of how you feel.  If you don't feel good on the higher dose, we can go back to the 300 mg, and increase the Adderall XR from 10 mg to 15 mg. Please work on cutting back your tobacco use and alcohol use.  I will contact you with your lab results as soon as they are available.  If you have not heard from me in 2 weeks, please contact me.  The fastest way to get your results is to register for My Chart (see the instructions on the last page of this printout).     Fernande Bras, PA-C Physician Assistant-Certified [facility]

## 2012-06-10 NOTE — Patient Instructions (Addendum)
Try increasing the Wellbutrin back to 3 tabs (450 mg), and keep track of how you feel.  If you don't feel good on the higher dose, we can go back to the 300 mg, and increase the Adderall XR from 10 mg to 15 mg. Please work on cutting back your tobacco use and alcohol use.  I will contact you with your lab results as soon as they are available.  If you have not heard from me in 2 weeks, please contact me.  The fastest way to get your results is to register for My Chart (see the instructions on the last page of this printout).

## 2012-06-23 ENCOUNTER — Other Ambulatory Visit: Payer: Self-pay | Admitting: Physician Assistant

## 2012-06-23 ENCOUNTER — Encounter: Payer: Self-pay | Admitting: Physician Assistant

## 2012-06-23 MED ORDER — AMPHETAMINE-DEXTROAMPHET ER 15 MG PO CP24: 15.0000 mg | ORAL_CAPSULE | ORAL | Status: DC

## 2012-07-04 ENCOUNTER — Other Ambulatory Visit: Payer: Self-pay | Admitting: Physician Assistant

## 2012-07-04 ENCOUNTER — Encounter: Payer: Self-pay | Admitting: Physician Assistant

## 2012-07-04 MED ORDER — ALPRAZOLAM 0.5 MG PO TABS: 0.5000 mg | ORAL_TABLET | Freq: Two times a day (BID) | ORAL | Status: DC | PRN

## 2012-07-04 NOTE — Telephone Encounter (Signed)
[name] 

## 2012-07-06 ENCOUNTER — Other Ambulatory Visit: Payer: Self-pay | Admitting: Physician Assistant

## 2012-07-06 MED ORDER — ATORVASTATIN CALCIUM 20 MG PO TABS: 20.0000 mg | ORAL_TABLET | Freq: Every day | ORAL | Status: DC

## 2012-07-22 ENCOUNTER — Encounter: Payer: Self-pay | Admitting: Physician Assistant

## 2012-07-23 ENCOUNTER — Telehealth: Payer: Self-pay

## 2012-07-23 MED ORDER — AMPHETAMINE-DEXTROAMPHET ER 15 MG PO CP24: 15.0000 mg | ORAL_CAPSULE | ORAL | Status: DC

## 2012-07-23 NOTE — Telephone Encounter (Signed)
My chart message was sent to her to advise.

## 2012-07-23 NOTE — Telephone Encounter (Signed)
Patient put in a message through MyChart for an Adderall refill. Patient has not heard back yet. Down to her last pill. [phone]

## 2012-07-23 NOTE — Telephone Encounter (Signed)
Ready for pick-up.  Please let patient know that [name] is on vacation and I can only write her for 1 month at a time.  Next month she can talk to [facility] about getting more than 1 month at a time.

## 2012-07-24 ENCOUNTER — Other Ambulatory Visit: Payer: Self-pay | Admitting: Radiology

## 2012-07-24 NOTE — Telephone Encounter (Signed)
Patient advised rx ready for pick up 

## 2012-08-01 ENCOUNTER — Other Ambulatory Visit: Payer: Self-pay | Admitting: Physician Assistant

## 2012-08-15 ENCOUNTER — Ambulatory Visit (INDEPENDENT_AMBULATORY_CARE_PROVIDER_SITE_OTHER): Payer: BC Managed Care – PPO | Admitting: Family Medicine

## 2012-08-15 VITALS — BP 164/82 | HR 99 | Temp 98.4°F | Resp 16 | Ht 63.0 in | Wt 136.0 lb

## 2012-08-15 DIAGNOSIS — R197 Diarrhea, unspecified: Secondary | ICD-10-CM

## 2012-08-15 DIAGNOSIS — J029 Acute pharyngitis, unspecified: Secondary | ICD-10-CM

## 2012-08-15 DIAGNOSIS — B9789 Other viral agents as the cause of diseases classified elsewhere: Secondary | ICD-10-CM

## 2012-08-15 DIAGNOSIS — R21 Rash and other nonspecific skin eruption: Secondary | ICD-10-CM

## 2012-08-15 LAB — COMPREHENSIVE METABOLIC PANEL
Alkaline Phosphatase: 66 U/L (ref 39–117)
BUN: 8 mg/dL (ref 6–23)
CO2: 25 mEq/L (ref 19–32)
Creat: 0.9 mg/dL (ref 0.50–1.10)
Glucose, Bld: 96 mg/dL (ref 70–99)
Sodium: 138 mEq/L (ref 135–145)
Total Bilirubin: 0.3 mg/dL (ref 0.3–1.2)
Total Protein: 6.7 g/dL (ref 6.0–8.3)

## 2012-08-15 LAB — POCT CBC
HCT, POC: 42.8 % (ref 37.7–47.9)
Hemoglobin: 13.6 g/dL (ref 12.2–16.2)
Lymph, poc: 2.7 (ref 0.6–3.4)
MCH, POC: 31.1 pg (ref 27–31.2)
MCHC: 31.8 g/dL (ref 31.8–35.4)
POC Granulocyte: 4.6 (ref 2–6.9)
POC LYMPH PERCENT: 34.8 %L (ref 10–50)
POC MID %: 6.8 %M (ref 0–12)
RDW, POC: 13.6 %
WBC: 7.8 10*3/uL (ref 4.6–10.2)

## 2012-08-15 LAB — COMPREHENSIVE METABOLIC PANEL WITH GFR
ALT: 23 U/L (ref 0–35)
AST: 18 U/L (ref 0–37)
Albumin: 4 g/dL (ref 3.5–5.2)
Calcium: 9.5 mg/dL (ref 8.4–10.5)
Chloride: 105 meq/L (ref 96–112)
Potassium: 4.3 meq/L (ref 3.5–5.3)

## 2012-08-15 LAB — POCT RAPID STREP A (OFFICE): Rapid Strep A Screen: NEGATIVE

## 2012-08-15 NOTE — Progress Notes (Signed)
 [facility]:  Office Visit  Chief Complaint:  Chief Complaint  Patient presents with  . Diarrhea  . Headache    x 4 days   . Rash    x 1 day   . Sore Throat    x 4 days    HPI: Lynn Harvey is a 47 y.o. female who complains of  HA and sore throat x 4 days and also has had 8 episodes of diarrhea yesterday but took Immodium and only had 1 today. She has been in the hosptial recently visiting her niece's 2 week child who has had stomach surgery She has also been around a 45 year old . Her sore throt feels like there is a tightenng round it but it does not hurt, she deneis CP/SOB/wheezing/throat changes. No facial or ear pain. She denies URI sxs Denies new meds, new travels, sick contacts with GI problems, Deneis nausea, vomiting, No fevers, or chills.  She has had some blood in her rectum but it has been chronic, she has been sent to a GI specialist in [place]  She has a new rash on both arms, new today, she has been out in the sun. Denies any neck rigidity, vision changes, AMS, light sensitivity    Past Medical History  Diagnosis Date  . Hypertension   . Depression   . Insomnia   . Gestational diabetes   . Allergy   . Anxiety   . Rectal abnormality    Past Surgical History  Procedure Laterality Date  . Wrist surgery  1987    RIGHT; bone from forearm grafted to wrist  . Abdominal hysterectomy    . Tonsilectomy, adenoidectomy, bilateral myringotomy and tubes    . Colon surgery     History   Social History  . Marital Status: Married    Spouse Name: [name]    Number of Children: 3  . Years of Education: 12   Occupational History  . CUSTOMER SERVICE [employer]   Social History Main Topics  . Smoking status: Current Every Day Smoker -- 2.00 packs/day for 20 years    Types: Cigarettes    Start date: 12/25/1982  . Smokeless tobacco: Never Used  . Alcohol Use: 14.4 oz/week    24 Cans of beer per week     Comment: more since fall  2012  . Drug Use: No  . Sexually Active: Yes -- Female partner(s)    Birth Control/ Protection: Surgical   Other Topics Concern  . None   Social History Narrative   Lives with her husband, their son Lynn Harvey, and her daughter Lynn Harvey and Lynn Harvey.   Family History  Problem Relation Age of Onset  . Cancer Mother   . COPD Mother     emphysema  . Heart disease Father 22    s/p 3V CABG  . Depression Father   . Mental illness Sister     depression  . Depression Sister   . Mental illness Son     ADHD, bipolar disorder  . Diabetes Maternal Grandmother   . Heart disease Maternal Grandmother   . Diabetes Maternal Grandfather   . Cancer Maternal Grandfather     lung cancer  . Diabetes Paternal Grandmother   . Kidney disease Paternal Grandmother   . Diabetes Paternal Grandfather   . Heart disease Paternal Grandfather    No Known Allergies Prior to Admission medications   Medication Sig Start Date End Date Taking? Authorizing  Provider  ALPRAZolam Prudy Feeler) 0.5 MG tablet Take 1 tablet (0.5 mg total) by mouth 2 (two) times daily as needed for sleep or anxiety. 07/04/12  Yes [name] S Jeffery, PA-C  amphetamine-dextroamphetamine (ADDERALL XR) 15 MG 24 hr capsule Take 1 capsule (15 mg total) by mouth every morning. 07/23/12  Yes Morrell Riddle, PA-C  buPROPion (WELLBUTRIN XL) 150 MG 24 hr tablet Take 2 tablets (300 mg total) by mouth every morning. 08/01/12  Yes Ryan M Dunn, PA-C  fluticasone (FLONASE) 50 MCG/ACT nasal spray Place 2 sprays into the nose daily. 04/17/12  Yes Morrell Riddle, PA-C  atorvastatin (LIPITOR) 20 MG tablet Take 1 tablet (20 mg total) by mouth daily. 07/06/12   [name] S Jeffery, PA-C     ROS: The patient denies fevers, chills, night sweats, unintentional weight loss, chest pain, palpitations, wheezing, dyspnea on exertion, nausea, vomiting, abdominal pain, dysuria, hematuria, melena, numbness, weakness, or tingling.   All other systems have been reviewed and were  otherwise negative with the exception of those mentioned in the HPI and as above.    PHYSICAL EXAM: Filed Vitals:   08/15/12 1614  BP: 164/82  Pulse: 99  Temp: 98.4 F (36.9 C)  Resp: 16   Filed Vitals:   08/15/12 1614  Height: 5\' 3"  (1.6 m)  Weight: 136 lb (61.689 kg)   Body mass index is 24.1 kg/(m^2).  General: Alert, no acute distress, she seems tired and depressed HEENT:  Normocephalic, atraumatic, oropharynx patent. No tonsils, minimally erythematous throat. Tm nl. EOMI, PERRLA Cardiovascular:  Regular rate and rhythm, no rubs murmurs or gallops.  No Carotid bruits, radial pulse intact. No pedal edema.  Respiratory: Clear to auscultation bilaterally.  No wheezes, rales, or rhonchi.  No cyanosis, no use of accessory musculature GI: No organomegaly, abdomen is soft and non-tender, positive bowel sounds.  No masses. Skin: No rashes. Neurologic: Facial musculature symmetric. No meningeal signs Psychiatric: Patient is appropriate throughout our interaction. Lymphatic: No cervical lymphadenopathy Musculoskeletal: Gait intact.   LABS: Results for orders placed in visit on 08/15/12  POCT RAPID STREP A (OFFICE)      Result Value Range   Rapid Strep A Screen Negative  Negative  POCT CBC      Result Value Range   WBC 7.8  4.6 - 10.2 K/uL   Lymph, poc 2.7  0.6 - 3.4   POC LYMPH PERCENT 34.8  10 - 50 %L   MID (cbc) 0.5  0 - 0.9   POC MID % 6.8  0 - 12 %M   POC Granulocyte 4.6  2 - 6.9   Granulocyte percent 58.4  37 - 80 %G   RBC 4.37  4.04 - 5.48 M/uL   Hemoglobin 13.6  12.2 - 16.2 g/dL   HCT, POC 96.0  45.4 - 47.9 %   MCV 97.9 (*) 80 - 97 fL   MCH, POC 31.1  27 - 31.2 pg   MCHC 31.8  31.8 - 35.4 g/dL   RDW, POC 09.8     Platelet Count, POC 316  142 - 424 K/uL   MPV 8.4  0 - 99.8 fL     EKG/XRAY:   Primary read interpreted by Dr. Conley Rolls at [facility].   ASSESSMENT/PLAN: Encounter Diagnoses  Name Primary?  . Sore throat Yes  . Diarrhea   . Rash and nonspecific skin  eruption   . Unspecified viral infection, in conditions classified elsewhere and of unspecified site    Dc Immodium Noninfectious  in origin maybe viral since was in hospital visiting Will monitor rash Labs pending If need abx then will give it to her. I do not think this is mono or meningitis BRAT deit, push fluids HA due to possible dehydration F/u prn    ,  PHUONG, DO 08/15/2012 5:18 PM

## 2012-08-15 NOTE — Patient Instructions (Signed)
B.R.A.T. Diet Your doctor has recommended the B.R.A.T. diet for you or your child until the condition improves. This is often used to help control diarrhea and vomiting symptoms. If you or your child can tolerate clear liquids, you may have:  Bananas.   Rice.   Applesauce.   Toast (and other simple starches such as crackers, potatoes, noodles).  Be sure to avoid dairy products, meats, and fatty foods until symptoms are better. Fruit juices such as apple, grape, and prune juice can make diarrhea worse. Avoid these. Continue this diet for 2 days or as instructed by your caregiver. Document Released: 02/05/2005 Document Revised: 01/25/2011 Document Reviewed: 07/25/2006 Exit[employer] Patient Information 2012 [employer]. 

## 2012-08-19 ENCOUNTER — Telehealth: Payer: Self-pay

## 2012-08-19 NOTE — Telephone Encounter (Signed)
Please advise 

## 2012-08-19 NOTE — Telephone Encounter (Signed)
Pt would like a refill on adderall. Best # [phone]

## 2012-08-21 MED ORDER — AMPHETAMINE-DEXTROAMPHET ER 15 MG PO CP24: 15.0000 mg | ORAL_CAPSULE | ORAL | Status: DC

## 2012-08-21 NOTE — Telephone Encounter (Signed)
Rx [printed at 104.  Will bring to 102 after my clinic.  Meds ordered this encounter  Medications  . amphetamine-dextroamphetamine (ADDERALL XR) 15 MG 24 hr capsule    Sig: Take 1 capsule (15 mg total) by mouth every morning.    Dispense:  30 capsule    Refill:  0    Order Specific Question:  Supervising Provider    Answer:  DOOLITTLE, ROBERT P [[id number]]

## 2012-08-22 NOTE — Telephone Encounter (Signed)
Lm that RX is in the pick up box.

## 2012-09-03 ENCOUNTER — Other Ambulatory Visit: Payer: Self-pay | Admitting: Physician Assistant

## 2012-09-06 ENCOUNTER — Encounter: Payer: Self-pay | Admitting: Physician Assistant

## 2012-09-08 MED ORDER — ALPRAZOLAM 0.5 MG PO TABS: 0.5000 mg | ORAL_TABLET | Freq: Two times a day (BID) | ORAL | Status: DC | PRN

## 2012-09-08 MED ORDER — VARENICLINE TARTRATE 0.5 MG PO TABS: 0.5000 mg | ORAL_TABLET | Freq: Two times a day (BID) | ORAL | Status: DC

## 2012-09-10 ENCOUNTER — Other Ambulatory Visit: Payer: Self-pay | Admitting: Physician Assistant

## 2012-09-20 ENCOUNTER — Telehealth: Payer: Self-pay

## 2012-09-21 MED ORDER — AMPHETAMINE-DEXTROAMPHET ER 15 MG PO CP24: 15.0000 mg | ORAL_CAPSULE | ORAL | Status: DC

## 2012-10-14 ENCOUNTER — Telehealth: Payer: Self-pay

## 2012-10-14 MED ORDER — VARENICLINE TARTRATE 0.5 MG PO TABS: 0.5000 mg | ORAL_TABLET | Freq: Two times a day (BID) | ORAL | Status: DC

## 2012-10-14 NOTE — Telephone Encounter (Signed)
Resent

## 2012-10-14 NOTE — Telephone Encounter (Signed)
Patient of Lynn Harvey.  Was prescribed Chantex? And could not afford but now has coupon so she would like it prescribed again.  Call at [phone].

## 2012-10-22 ENCOUNTER — Encounter: Payer: Self-pay | Admitting: Physician Assistant

## 2012-10-22 ENCOUNTER — Other Ambulatory Visit: Payer: Self-pay | Admitting: Physician Assistant

## 2012-10-23 ENCOUNTER — Telehealth: Payer: Self-pay

## 2012-10-23 MED ORDER — AMPHETAMINE-DEXTROAMPHET ER 15 MG PO CP24: 15.0000 mg | ORAL_CAPSULE | ORAL | Status: DC

## 2012-10-23 MED ORDER — ALPRAZOLAM 0.5 MG PO TABS: 0.5000 mg | ORAL_TABLET | Freq: Two times a day (BID) | ORAL | Status: DC | PRN

## 2012-10-23 MED ORDER — BUPROPION HCL ER (XL) 150 MG PO TB24: 300.0000 mg | ORAL_TABLET | ORAL | Status: DC

## 2012-10-23 NOTE — Telephone Encounter (Signed)
Patient needs a refill on Wellbutrin, Adderall, and Xanax. States she submitted a refill request online and has not heard anything from [place] yet. [phone]

## 2012-10-23 NOTE — Telephone Encounter (Signed)
Patient notified by MyChart  Meds ordered this encounter  Medications  . ALPRAZolam (XANAX) 0.5 MG tablet    Sig: Take 1 tablet (0.5 mg total) by mouth 2 (two) times daily as needed for sleep or anxiety.    Dispense:  60 tablet    Refill:  0    Patient needs office visit before this runs out.    Order Specific Question:  Supervising Provider    Answer:  DOOLITTLE, ROBERT P [[id number]]  . amphetamine-dextroamphetamine (ADDERALL XR) 15 MG 24 hr capsule    Sig: Take 1 capsule (15 mg total) by mouth every morning.    Dispense:  30 capsule    Refill:  0    Patient needs office visit before this runs out    Order Specific Question:  Supervising Provider    Answer:  DOOLITTLE, ROBERT P [[id number]]  . buPROPion (WELLBUTRIN XL) 150 MG 24 hr tablet    Sig: Take 2 tablets (300 mg total) by mouth every morning.    Dispense:  60 tablet    Refill:  0

## 2012-10-28 ENCOUNTER — Ambulatory Visit (INDEPENDENT_AMBULATORY_CARE_PROVIDER_SITE_OTHER): Payer: BC Managed Care – PPO | Admitting: Physician Assistant

## 2012-10-28 ENCOUNTER — Ambulatory Visit: Payer: 59

## 2012-10-28 ENCOUNTER — Encounter: Payer: Self-pay | Admitting: Physician Assistant

## 2012-10-28 VITALS — BP 150/81 | HR 84 | Temp 98.1°F | Resp 16 | Ht 63.5 in | Wt 128.0 lb

## 2012-10-28 DIAGNOSIS — M25561 Pain in right knee: Secondary | ICD-10-CM

## 2012-10-28 DIAGNOSIS — F17200 Nicotine dependence, unspecified, uncomplicated: Secondary | ICD-10-CM

## 2012-10-28 DIAGNOSIS — M25569 Pain in unspecified knee: Secondary | ICD-10-CM

## 2012-10-28 DIAGNOSIS — E785 Hyperlipidemia, unspecified: Secondary | ICD-10-CM

## 2012-10-28 DIAGNOSIS — R41840 Attention and concentration deficit: Secondary | ICD-10-CM

## 2012-10-28 DIAGNOSIS — F329 Major depressive disorder, single episode, unspecified: Secondary | ICD-10-CM

## 2012-10-28 DIAGNOSIS — Z23 Encounter for immunization: Secondary | ICD-10-CM

## 2012-10-28 DIAGNOSIS — Z720 Tobacco use: Secondary | ICD-10-CM

## 2012-10-28 LAB — COMPREHENSIVE METABOLIC PANEL
Albumin: 4.4 g/dL (ref 3.5–5.2)
Alkaline Phosphatase: 63 U/L (ref 39–117)
CO2: 26 mEq/L (ref 19–32)
Glucose, Bld: 86 mg/dL (ref 70–99)
Potassium: 4.5 mEq/L (ref 3.5–5.3)
Sodium: 139 mEq/L (ref 135–145)
Total Protein: 6.9 g/dL (ref 6.0–8.3)

## 2012-10-28 LAB — LIPID PANEL
LDL Cholesterol: 101 mg/dL — ABNORMAL HIGH (ref 0–99)
Triglycerides: 189 mg/dL — ABNORMAL HIGH (ref <150)

## 2012-10-28 MED ORDER — MELOXICAM 15 MG PO TABS: 15.0000 mg | ORAL_TABLET | Freq: Every day | ORAL | Status: DC

## 2012-10-28 NOTE — Patient Instructions (Addendum)
For Cholesterol: Red Yeast Rice Extract Apple Cider Vinegar Niacin (slo-niacin causes less flushing) Healthy eating Regular exercise Quitting smoking  Call to schedule an appointment with your therapist when you can.
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
## 2012-11-20 ENCOUNTER — Other Ambulatory Visit: Payer: Self-pay | Admitting: Physician Assistant

## 2012-11-20 NOTE — Telephone Encounter (Signed)
I am out of the office until Tuesday. Please ask one of the other PAs to authorize these for me, as I am not able to print the controlled substances from home (and Adderall cannot be called in).

## 2012-11-24 MED ORDER — AMPHETAMINE-DEXTROAMPHET ER 15 MG PO CP24: 15.0000 mg | ORAL_CAPSULE | ORAL | Status: DC

## 2012-11-24 MED ORDER — BUPROPION HCL ER (XL) 150 MG PO TB24: 300.0000 mg | ORAL_TABLET | ORAL | Status: DC

## 2012-11-24 NOTE — Telephone Encounter (Signed)
PT CHECKING ON THE STATUS OF HER MEDICATION . PLEASE CALL [phone]

## 2012-11-24 NOTE — Telephone Encounter (Signed)
Rx signed at TL desk

## 2012-12-11 ENCOUNTER — Encounter: Payer: Self-pay | Admitting: Physician Assistant

## 2012-12-11 ENCOUNTER — Other Ambulatory Visit: Payer: Self-pay | Admitting: Physician Assistant

## 2012-12-11 MED ORDER — AMPHETAMINE-DEXTROAMPHET ER 15 MG PO CP24: 15.0000 mg | ORAL_CAPSULE | ORAL | Status: DC

## 2012-12-11 MED ORDER — BUPROPION HCL ER (XL) 150 MG PO TB24: 300.0000 mg | ORAL_TABLET | ORAL | Status: DC

## 2012-12-11 MED ORDER — ALPRAZOLAM 0.5 MG PO TABS: 0.5000 mg | ORAL_TABLET | Freq: Two times a day (BID) | ORAL | Status: DC | PRN

## 2013-01-21 ENCOUNTER — Telehealth: Payer: Self-pay

## 2013-01-21 ENCOUNTER — Other Ambulatory Visit: Payer: Self-pay

## 2013-01-21 MED ORDER — AMPHETAMINE-DEXTROAMPHET ER 15 MG PO CP24: 15.0000 mg | ORAL_CAPSULE | ORAL | Status: DC

## 2013-01-21 MED ORDER — BUPROPION HCL ER (XL) 150 MG PO TB24: 300.0000 mg | ORAL_TABLET | ORAL | Status: DC

## 2013-01-21 MED ORDER — VARENICLINE TARTRATE 0.5 MG PO TABS: 0.5000 mg | ORAL_TABLET | Freq: Two times a day (BID) | ORAL | Status: DC

## 2013-01-21 NOTE — Telephone Encounter (Signed)
Meds ordered this encounter  Medications  . amphetamine-dextroamphetamine (ADDERALL XR) 15 MG 24 hr capsule    Sig: Take 1 capsule (15 mg total) by mouth every morning.    Dispense:  30 capsule    Refill:  0    Order Specific Question:  Supervising Provider    Answer:  DOOLITTLE, ROBERT P [[id number]]  . buPROPion (WELLBUTRIN XL) 150 MG 24 hr tablet    Sig: Take 2 tablets (300 mg total) by mouth every morning.    Dispense:  60 tablet    Refill:  0    Order Specific Question:  Supervising Provider    Answer:  DOOLITTLE, ROBERT P [[id number]]  . varenicline (CHANTIX) 0.5 MG tablet    Sig: Take 1 tablet (0.5 mg total) by mouth 2 (two) times daily.    Dispense:  60 tablet    Refill:  5    Order Specific Question:  Supervising Provider    Answer:  DOOLITTLE, ROBERT P [[id number]]

## 2013-01-21 NOTE — Telephone Encounter (Signed)
Called pt advised Rx ready to pick up.   

## 2013-01-21 NOTE — Telephone Encounter (Signed)
Pended Adderall and Wellbutrin, do you want her to take Chantix also?

## 2013-01-21 NOTE — Telephone Encounter (Signed)
Patient is changing pharmacies and needs the following:  amphetamine-dextroamphetamine (ADDERALL XR) 15 MG 24 hr capsule buPROPion (WELLBUTRIN XL) 150 MG 24 hr tablet varenicline (CHANTIX) 0.5 MG tablet   [facility]   [phone]

## 2013-01-23 ENCOUNTER — Telehealth: Payer: Self-pay

## 2013-01-23 MED ORDER — ALPRAZOLAM 0.5 MG PO TABS: 0.5000 mg | ORAL_TABLET | Freq: Two times a day (BID) | ORAL | Status: DC | PRN

## 2013-01-23 NOTE — Telephone Encounter (Signed)
Rx printed.  Meds ordered this encounter  Medications  . ALPRAZolam (XANAX) 0.5 MG tablet    Sig: Take 1 tablet (0.5 mg total) by mouth 2 (two) times daily as needed for sleep or anxiety.    Dispense:  60 tablet    Refill:  0    Order Specific Question:  Supervising Provider    Answer:  DOOLITTLE, ROBERT P [[id number]]

## 2013-01-23 NOTE — Telephone Encounter (Signed)
Refill on xanax please   [facility]   [phone]

## 2013-01-26 ENCOUNTER — Other Ambulatory Visit: Payer: Self-pay

## 2013-02-23 ENCOUNTER — Encounter: Payer: Self-pay | Admitting: Physician Assistant

## 2013-02-23 ENCOUNTER — Telehealth: Payer: Self-pay

## 2013-02-23 MED ORDER — BUPROPION HCL ER (XL) 150 MG PO TB24: 300.0000 mg | ORAL_TABLET | ORAL | Status: DC

## 2013-02-23 MED ORDER — AMPHETAMINE-DEXTROAMPHET ER 15 MG PO CP24: 15.0000 mg | ORAL_CAPSULE | ORAL | Status: DC

## 2013-02-23 NOTE — Telephone Encounter (Signed)
Called to advise. Ready for pick up.

## 2013-02-23 NOTE — Telephone Encounter (Signed)
Ready to pick up.  

## 2013-02-23 NOTE — Telephone Encounter (Signed)
Pt requesting rx refill for her adderall to be picked up and her wellbutrin called to [facility]    [facility] phone [phone]   [facility] for wellbutrin

## 2013-02-28 ENCOUNTER — Other Ambulatory Visit: Payer: Self-pay | Admitting: Physician Assistant

## 2013-03-02 NOTE — Telephone Encounter (Signed)
Please call the number and find out what the issue is.  It is OK to switch to immediate release.

## 2013-03-11 ENCOUNTER — Telehealth: Payer: Self-pay

## 2013-03-11 NOTE — Telephone Encounter (Signed)
Lynn Harvey,  Patient is looking forward to hearing from you regarding her medication that is no longer covered by insurance.  She is hoping you were able to find an alternative medication.   [phone]

## 2013-03-12 MED ORDER — AMPHETAMINE-DEXTROAMPHETAMINE 15 MG PO TABS: 7.5000 mg | ORAL_TABLET | Freq: Two times a day (BID) | ORAL | Status: DC

## 2013-03-12 NOTE — Telephone Encounter (Signed)
It looks like my message regarding this didn't get passed on/sorted out on 03/08/13, and Lynn Harvey refilled the XR again, which the patient is asking to change to the immediate release due to cost.  I've changed her to the immediate release. She'll take 7.5 mg (1/2 of the 15 mg tablet) two times each day.  Let me know how that works.  Meds ordered this encounter  Medications  . amphetamine-dextroamphetamine (ADDERALL) 15 MG tablet    Sig: Take 0.5 tablets (7.5 mg total) by mouth 2 (two) times daily.    Dispense:  30 tablet    Refill:  0    Order Specific Question:  Supervising Provider    Answer:  DOOLITTLE, ROBERT P [[id number]]

## 2013-03-13 NOTE — Telephone Encounter (Signed)
LM for pt- Rx is in the p/u drawer  Would like to discuss directions as this is a new script for her.

## 2013-03-13 NOTE — Telephone Encounter (Signed)
Advised pt of directions and to RTC for further refills to check on how this is working for her.

## 2013-03-19 ENCOUNTER — Telehealth: Payer: Self-pay

## 2013-03-19 NOTE — Telephone Encounter (Signed)
Patient states that her Adderall 15mg  now requires a prior authorization. [employer]  [phone]

## 2013-03-21 ENCOUNTER — Other Ambulatory Visit: Payer: Self-pay | Admitting: Physician Assistant

## 2013-03-23 MED ORDER — BUPROPION HCL ER (XL) 150 MG PO TB24: 300.0000 mg | ORAL_TABLET | ORAL | Status: DC

## 2013-03-23 MED ORDER — ALPRAZOLAM 0.5 MG PO TABS: 0.5000 mg | ORAL_TABLET | Freq: Two times a day (BID) | ORAL | Status: DC | PRN

## 2013-03-23 NOTE — Telephone Encounter (Signed)
PA approved through 03/23/14. Faxed pharm and LMOM for pt.

## 2013-03-23 NOTE — Telephone Encounter (Signed)
Pt cb to check status. I have not gotten fax from pharm w/ins info. Called [facility] and was given info and also will fax over.

## 2013-03-23 NOTE — Telephone Encounter (Signed)
Completed PA on covermymeds. Asked for expedited review.

## 2013-03-24 NOTE — Telephone Encounter (Signed)
faxed

## 2013-04-13 ENCOUNTER — Telehealth: Payer: Self-pay

## 2013-04-13 NOTE — Telephone Encounter (Signed)
Pt is calling regarding concerns about adderall medication. She is having issues with the "Non-time" release medication change and wants to know if what she is experiencing is normal Pt also needs to talk with someone regarding fever blisters

## 2013-04-15 NOTE — Telephone Encounter (Signed)
It's totally up to her. We can switch to the extended release whenever she wants. She may want to try taking the second dose earlier, before the first starts to wear off, and that may be smoother and reduce the adverse effects she's having in the evenings. She can also try taking the whole pill in the mornings-sometimes we find that the higher dose lasts longer, and she may not need a second dose.

## 2013-04-15 NOTE — Telephone Encounter (Signed)
The non time released adderall is not smooth"". Pt states she can tell when she needs to take it which she said is ok but at night time she's real aggravated and just wants to be left alone. This happens when she feels like it's wearing off in the evenings. Is this normal? She's taking half a pill in the morning and other half in the afternoon. She's not sure if she needs to allow more time or should she go ahead and get the time released. Originally she was doing the non time released save money.

## 2013-04-19 ENCOUNTER — Telehealth: Payer: Self-pay | Admitting: Family Medicine

## 2013-04-19 NOTE — Telephone Encounter (Signed)
Spoke with patient about message from [place] she states that she will try the whole pill in the morning and will follow up with [name] next week

## 2013-04-23 ENCOUNTER — Telehealth: Payer: Self-pay

## 2013-04-23 ENCOUNTER — Ambulatory Visit (INDEPENDENT_AMBULATORY_CARE_PROVIDER_SITE_OTHER): Payer: 59 | Admitting: Physician Assistant

## 2013-04-23 ENCOUNTER — Encounter: Payer: Self-pay | Admitting: Physician Assistant

## 2013-04-23 VITALS — BP 142/86 | HR 89 | Temp 98.1°F | Resp 16 | Ht 63.5 in | Wt 136.0 lb

## 2013-04-23 DIAGNOSIS — F3289 Other specified depressive episodes: Secondary | ICD-10-CM

## 2013-04-23 DIAGNOSIS — F988 Other specified behavioral and emotional disorders with onset usually occurring in childhood and adolescence: Secondary | ICD-10-CM

## 2013-04-23 DIAGNOSIS — F329 Major depressive disorder, single episode, unspecified: Secondary | ICD-10-CM

## 2013-04-23 DIAGNOSIS — F32A Depression, unspecified: Secondary | ICD-10-CM

## 2013-04-23 MED ORDER — METHYLPHENIDATE HCL 10 MG PO TABS: 10.0000 mg | ORAL_TABLET | Freq: Two times a day (BID) | ORAL | Status: DC

## 2013-04-23 MED ORDER — BUPROPION HCL ER (XL) 150 MG PO TB24: 300.0000 mg | ORAL_TABLET | ORAL | Status: DC
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
## 2013-04-23 NOTE — Patient Instructions (Signed)
Keep up the great work cutting back on smoking! Keep using Lynn Harvey as your inspiration!

## 2013-04-27 ENCOUNTER — Telehealth: Payer: Self-pay

## 2013-04-27 NOTE — Telephone Encounter (Signed)
PA needed for Ritalin. Completed and faxed to [facility]. Received approval through 04/28/14 and notified pharm and pt.

## 2013-05-12 MED ORDER — ALPRAZOLAM 0.5 MG PO TABS: 0.5000 mg | ORAL_TABLET | Freq: Two times a day (BID) | ORAL | Status: DC | PRN

## 2013-05-12 NOTE — Telephone Encounter (Signed)
Meds ordered this encounter  Medications  . ALPRAZolam (XANAX) 0.5 MG tablet    Sig: Take 1 tablet (0.5 mg total) by mouth 2 (two) times daily as needed for sleep or anxiety.    Dispense:  60 tablet    Refill:  0    Order Specific Question:  Supervising Provider    Answer:  Tonye Pearson [[id number]]   Patient notified by My Chart. Please fax/call to her pharmacy.

## 2013-05-18 MED ORDER — METHYLPHENIDATE HCL 10 MG PO TABS: 15.0000 mg | ORAL_TABLET | Freq: Two times a day (BID) | ORAL | Status: DC

## 2013-05-18 MED ORDER — AMPHETAMINE-DEXTROAMPHETAMINE 15 MG PO TABS: 15.0000 mg | ORAL_TABLET | Freq: Two times a day (BID) | ORAL | Status: DC

## 2013-05-18 NOTE — Telephone Encounter (Signed)
Rx printed. Patient notified via My Chart.  

## 2013-05-19 ENCOUNTER — Telehealth: Payer: Self-pay

## 2013-05-19 NOTE — Telephone Encounter (Signed)
Pt had reported to [name] that the Ritalin she was Rxd was ineffective and [name] wrote for a trial of IR Adderall 15 mg BID. Called pt to notify Rx is ready.

## 2013-06-09 ENCOUNTER — Other Ambulatory Visit: Payer: Self-pay

## 2013-06-09 NOTE — Telephone Encounter (Signed)
Received request for refill. 

## 2013-06-11 MED ORDER — ALPRAZOLAM 0.5 MG PO TABS: 0.5000 mg | ORAL_TABLET | Freq: Two times a day (BID) | ORAL | Status: DC | PRN

## 2013-06-19 DIAGNOSIS — I219 Acute myocardial infarction, unspecified: Secondary | ICD-10-CM | POA: Insufficient documentation

## 2013-06-19 HISTORY — DX: Acute myocardial infarction, unspecified: I21.9

## 2013-06-24 ENCOUNTER — Encounter (HOSPITAL_COMMUNITY): Payer: Self-pay | Admitting: Emergency Medicine

## 2013-06-24 ENCOUNTER — Inpatient Hospital Stay (HOSPITAL_COMMUNITY): Payer: 59

## 2013-06-24 ENCOUNTER — Inpatient Hospital Stay (HOSPITAL_COMMUNITY)
Admission: EM | Admit: 2013-06-24 | Discharge: 2013-06-26 | DRG: 251 | Disposition: A | Discharge status: Home / Self Care | Payer: 59 | Attending: Family Medicine | Admitting: Family Medicine

## 2013-06-24 ENCOUNTER — Encounter (HOSPITAL_COMMUNITY)
Admission: EM | Disposition: A | Discharge status: Home / Self Care | Payer: 59 | Source: Home / Self Care | Attending: Family Medicine

## 2013-06-24 DIAGNOSIS — F988 Other specified behavioral and emotional disorders with onset usually occurring in childhood and adolescence: Secondary | ICD-10-CM

## 2013-06-24 DIAGNOSIS — I10 Essential (primary) hypertension: Secondary | ICD-10-CM | POA: Diagnosis present

## 2013-06-24 DIAGNOSIS — R030 Elevated blood-pressure reading, without diagnosis of hypertension: Secondary | ICD-10-CM

## 2013-06-24 DIAGNOSIS — F3289 Other specified depressive episodes: Secondary | ICD-10-CM | POA: Diagnosis present

## 2013-06-24 DIAGNOSIS — Z720 Tobacco use: Secondary | ICD-10-CM

## 2013-06-24 DIAGNOSIS — IMO0002 Reserved for concepts with insufficient information to code with codable children: Secondary | ICD-10-CM

## 2013-06-24 DIAGNOSIS — E7800 Pure hypercholesterolemia, unspecified: Secondary | ICD-10-CM

## 2013-06-24 DIAGNOSIS — F32A Depression, unspecified: Secondary | ICD-10-CM | POA: Diagnosis present

## 2013-06-24 DIAGNOSIS — R079 Chest pain, unspecified: Secondary | ICD-10-CM

## 2013-06-24 DIAGNOSIS — F17200 Nicotine dependence, unspecified, uncomplicated: Secondary | ICD-10-CM | POA: Diagnosis present

## 2013-06-24 DIAGNOSIS — F329 Major depressive disorder, single episode, unspecified: Secondary | ICD-10-CM | POA: Diagnosis present

## 2013-06-24 DIAGNOSIS — F909 Attention-deficit hyperactivity disorder, unspecified type: Secondary | ICD-10-CM | POA: Diagnosis present

## 2013-06-24 DIAGNOSIS — IMO0001 Reserved for inherently not codable concepts without codable children: Secondary | ICD-10-CM

## 2013-06-24 DIAGNOSIS — E785 Hyperlipidemia, unspecified: Secondary | ICD-10-CM | POA: Diagnosis present

## 2013-06-24 DIAGNOSIS — Z9861 Coronary angioplasty status: Secondary | ICD-10-CM

## 2013-06-24 DIAGNOSIS — F1010 Alcohol abuse, uncomplicated: Secondary | ICD-10-CM | POA: Diagnosis present

## 2013-06-24 DIAGNOSIS — I214 Non-ST elevation (NSTEMI) myocardial infarction: Principal | ICD-10-CM | POA: Diagnosis present

## 2013-06-24 DIAGNOSIS — I2510 Atherosclerotic heart disease of native coronary artery without angina pectoris: Secondary | ICD-10-CM | POA: Diagnosis present

## 2013-06-24 DIAGNOSIS — Z8249 Family history of ischemic heart disease and other diseases of the circulatory system: Secondary | ICD-10-CM

## 2013-06-24 DIAGNOSIS — R7309 Other abnormal glucose: Secondary | ICD-10-CM | POA: Diagnosis present

## 2013-06-24 DIAGNOSIS — J309 Allergic rhinitis, unspecified: Secondary | ICD-10-CM

## 2013-06-24 HISTORY — PX: LEFT HEART CATHETERIZATION WITH CORONARY ANGIOGRAM: SHX5451

## 2013-06-24 LAB — HEMOGLOBIN A1C
HEMOGLOBIN A1C: 5.7 % — AB (ref <5.7)
Mean Plasma Glucose: 117 mg/dL — ABNORMAL HIGH (ref <117)

## 2013-06-24 LAB — BASIC METABOLIC PANEL
BUN: 5 mg/dL — ABNORMAL LOW (ref 6–23)
BUN: 6 mg/dL (ref 6–23)
CALCIUM: 9.2 mg/dL (ref 8.4–10.5)
CALCIUM: 9 mg/dL (ref 8.4–10.5)
CO2: 23 mEq/L (ref 19–32)
CO2: 23 mEq/L (ref 19–32)
CREATININE: 0.7 mg/dL (ref 0.50–1.10)
Chloride: 102 mEq/L (ref 96–112)
Chloride: 102 mEq/L (ref 96–112)
Creatinine, Ser: 0.66 mg/dL (ref 0.50–1.10)
GFR calc Af Amer: >90 mL/min (ref >90)
GFR calc Af Amer: >90 mL/min (ref >90)
GFR calc non Af Amer: >90 mL/min (ref >90)
GLUCOSE: 92 mg/dL (ref 70–99)
Glucose, Bld: 103 mg/dL — ABNORMAL HIGH (ref 70–99)
POTASSIUM: 4 meq/L (ref 3.7–5.3)
Potassium: 4.6 mEq/L (ref 3.7–5.3)
SODIUM: 138 meq/L (ref 137–147)
Sodium: 138 mEq/L (ref 137–147)

## 2013-06-24 LAB — CBC
HCT: 40.4 % (ref 36.0–46.0)
HEMATOCRIT: 39.3 % (ref 36.0–46.0)
HEMOGLOBIN: 13.3 g/dL (ref 12.0–15.0)
Hemoglobin: 14 g/dL (ref 12.0–15.0)
MCH: 31.6 pg (ref 26.0–34.0)
MCH: 30.9 pg (ref 26.0–34.0)
MCHC: 34.7 g/dL (ref 30.0–36.0)
MCHC: 33.8 g/dL (ref 30.0–36.0)
MCV: 91.2 fL (ref 78.0–100.0)
MCV: 91.2 fL (ref 78.0–100.0)
PLATELETS: 286 10*3/uL (ref 150–400)
Platelets: 273 10*3/uL (ref 150–400)
RBC: 4.43 MIL/uL (ref 3.87–5.11)
RBC: 4.31 MIL/uL (ref 3.87–5.11)
RDW: 13.5 % (ref 11.5–15.5)
RDW: 13.4 % (ref 11.5–15.5)
WBC: 9.7 10*3/uL (ref 4.0–10.5)
WBC: 6.7 10*3/uL (ref 4.0–10.5)

## 2013-06-24 LAB — PROTIME-INR
INR: 1.07 (ref 0.00–1.49)
Prothrombin Time: 13.7 seconds (ref 11.6–15.2)

## 2013-06-24 LAB — TROPONIN I
Troponin I: 0.54 ng/mL (ref <0.30)
Troponin I: 1.66 ng/mL (ref <0.30)

## 2013-06-24 LAB — DIFFERENTIAL
BASOS ABS: 0 10*3/uL (ref 0.0–0.1)
BASOS PCT: 0 % (ref 0–1)
Eosinophils Absolute: 0.1 10*3/uL (ref 0.0–0.7)
Eosinophils Relative: 1 % (ref 0–5)
Lymphocytes Relative: 25 % (ref 12–46)
Lymphs Abs: 2.5 10*3/uL (ref 0.7–4.0)
Monocytes Absolute: 0.5 10*3/uL (ref 0.1–1.0)
Monocytes Relative: 5 % (ref 3–12)
NEUTROS ABS: 6.6 10*3/uL (ref 1.7–7.7)
Neutrophils Relative %: 69 % (ref 43–77)

## 2013-06-24 LAB — LIPID PANEL
CHOL/HDL RATIO: 4.3 ratio
Cholesterol: 168 mg/dL (ref 0–200)
HDL: 39 mg/dL — ABNORMAL LOW (ref >39)
LDL Cholesterol: 108 mg/dL — ABNORMAL HIGH (ref 0–99)
Triglycerides: 103 mg/dL (ref <150)
VLDL: 21 mg/dL (ref 0–40)

## 2013-06-24 LAB — I-STAT TROPONIN, ED
TROPONIN I, POC: 0.12 ng/mL — AB (ref 0.00–0.08)
Troponin i, poc: 0.02 ng/mL (ref 0.00–0.08)

## 2013-06-24 LAB — POCT ACTIVATED CLOTTING TIME: Activated Clotting Time: 581 seconds

## 2013-06-24 LAB — PLATELET INHIBITION P2Y12: Platelet Function  P2Y12: 310 [PRU] (ref 194–418)

## 2013-06-24 LAB — TSH: TSH: 1.76 u[IU]/mL (ref 0.350–4.500)

## 2013-06-24 SURGERY — LEFT HEART CATHETERIZATION WITH CORONARY ANGIOGRAM
Anesthesia: LOCAL

## 2013-06-24 MED ORDER — ATORVASTATIN CALCIUM 40 MG PO TABS
40.0000 mg | ORAL_TABLET | Freq: Every day | ORAL | Status: DC
  Administered 2013-06-25: 19:00:00 40 mg via ORAL
  Filled 2013-06-24 (×3): qty 1

## 2013-06-24 MED ORDER — SODIUM CHLORIDE 0.9 % IV SOLN: INTRAVENOUS | Status: DC

## 2013-06-24 MED ORDER — HEPARIN BOLUS VIA INFUSION
3000.0000 [IU] | Freq: Once | INTRAVENOUS | Status: AC
  Administered 2013-06-24: 3000 [IU] via INTRAVENOUS
  Filled 2013-06-24: qty 3000

## 2013-06-24 MED ORDER — HEPARIN (PORCINE) IN NACL 2-0.9 UNIT/ML-% IJ SOLN
INTRAMUSCULAR | Status: AC
  Filled 2013-06-24: qty 1500

## 2013-06-24 MED ORDER — GI COCKTAIL ~~LOC~~
30.0000 mL | Freq: Once | ORAL | Status: AC
  Administered 2013-06-24: 30 mL via ORAL
  Filled 2013-06-24: qty 30

## 2013-06-24 MED ORDER — METOPROLOL TARTRATE 12.5 MG HALF TABLET
12.5000 mg | ORAL_TABLET | Freq: Two times a day (BID) | ORAL | Status: DC
  Administered 2013-06-24 – 2013-06-26 (×5): 12.5 mg via ORAL
  Filled 2013-06-24 (×7): qty 1

## 2013-06-24 MED ORDER — ONDANSETRON HCL 4 MG/2ML IJ SOLN
4.0000 mg | Freq: Three times a day (TID) | INTRAMUSCULAR | Status: DC | PRN
  Administered 2013-06-24: 4 mg via INTRAVENOUS

## 2013-06-24 MED ORDER — ASPIRIN EC 81 MG PO TBEC
81.0000 mg | DELAYED_RELEASE_TABLET | Freq: Every day | ORAL | Status: DC
  Administered 2013-06-25 – 2013-06-26 (×2): 81 mg via ORAL
  Filled 2013-06-24 (×2): qty 1

## 2013-06-24 MED ORDER — MIDAZOLAM HCL 2 MG/2ML IJ SOLN
INTRAMUSCULAR | Status: AC
  Filled 2013-06-24: qty 2

## 2013-06-24 MED ORDER — HEPARIN SODIUM (PORCINE) 5000 UNIT/ML IJ SOLN
5000.0000 [IU] | Freq: Three times a day (TID) | INTRAMUSCULAR | Status: DC
  Filled 2013-06-24 (×3): qty 1

## 2013-06-24 MED ORDER — TICAGRELOR 90 MG PO TABS
ORAL_TABLET | ORAL | Status: AC
  Administered 2013-06-24: 90 mg via ORAL
  Filled 2013-06-24: qty 2

## 2013-06-24 MED ORDER — ACETAMINOPHEN 325 MG PO TABS
650.0000 mg | ORAL_TABLET | Freq: Once | ORAL | Status: AC
  Administered 2013-06-24: 650 mg via ORAL
  Filled 2013-06-24: qty 2

## 2013-06-24 MED ORDER — ASPIRIN 81 MG PO CHEW
81.0000 mg | CHEWABLE_TABLET | ORAL | Status: AC
  Administered 2013-06-24: 81 mg via ORAL
  Filled 2013-06-24: qty 1

## 2013-06-24 MED ORDER — FENTANYL CITRATE 0.05 MG/ML IJ SOLN
INTRAMUSCULAR | Status: AC
  Filled 2013-06-24: qty 2

## 2013-06-24 MED ORDER — TICAGRELOR 90 MG PO TABS
90.0000 mg | ORAL_TABLET | Freq: Two times a day (BID) | ORAL | Status: DC
  Administered 2013-06-24 – 2013-06-26 (×4): 90 mg via ORAL
  Filled 2013-06-24 (×6): qty 1

## 2013-06-24 MED ORDER — NICOTINE 21 MG/24HR TD PT24
21.0000 mg | MEDICATED_PATCH | Freq: Every day | TRANSDERMAL | Status: DC
  Administered 2013-06-24 – 2013-06-26 (×3): 21 mg via TRANSDERMAL
  Filled 2013-06-24 (×3): qty 1

## 2013-06-24 MED ORDER — ALPRAZOLAM 0.5 MG PO TABS: 0.5000 mg | ORAL_TABLET | Freq: Two times a day (BID) | ORAL | Status: DC | PRN

## 2013-06-24 MED ORDER — NITROGLYCERIN 0.4 MG SL SUBL: 0.4000 mg | SUBLINGUAL_TABLET | SUBLINGUAL | Status: DC | PRN

## 2013-06-24 MED ORDER — NICOTINE 7 MG/24HR TD PT24
7.0000 mg | MEDICATED_PATCH | Freq: Every day | TRANSDERMAL | Status: DC
  Filled 2013-06-24: qty 1

## 2013-06-24 MED ORDER — BUPROPION HCL ER (XL) 300 MG PO TB24
300.0000 mg | ORAL_TABLET | Freq: Every day | ORAL | Status: DC
  Administered 2013-06-24 – 2013-06-26 (×3): 300 mg via ORAL
  Filled 2013-06-24 (×3): qty 1

## 2013-06-24 MED ORDER — ONDANSETRON HCL 4 MG/2ML IJ SOLN
INTRAMUSCULAR | Status: AC
  Filled 2013-06-24: qty 2

## 2013-06-24 MED ORDER — ALPRAZOLAM 0.5 MG PO TABS
0.5000 mg | ORAL_TABLET | Freq: Two times a day (BID) | ORAL | Status: DC | PRN
  Administered 2013-06-24: 0.25 mg via ORAL
  Administered 2013-06-24: 0.5 mg via ORAL
  Filled 2013-06-24: qty 2
  Filled 2013-06-24: qty 1

## 2013-06-24 MED ORDER — HEPARIN SODIUM (PORCINE) 1000 UNIT/ML IJ SOLN
INTRAMUSCULAR | Status: AC
  Filled 2013-06-24: qty 1

## 2013-06-24 MED ORDER — HEPARIN (PORCINE) IN NACL 100-0.45 UNIT/ML-% IJ SOLN
700.0000 [IU]/h | INTRAMUSCULAR | Status: DC
  Administered 2013-06-24: 700 [IU]/h via INTRAVENOUS
  Filled 2013-06-24: qty 250

## 2013-06-24 MED ORDER — VERAPAMIL HCL 2.5 MG/ML IV SOLN
INTRAVENOUS | Status: AC
  Filled 2013-06-24: qty 2

## 2013-06-24 MED ORDER — ASPIRIN EC 325 MG PO TBEC: 325.0000 mg | DELAYED_RELEASE_TABLET | Freq: Every day | ORAL | Status: DC

## 2013-06-24 MED ORDER — BIVALIRUDIN 250 MG IV SOLR
INTRAVENOUS | Status: AC
  Filled 2013-06-24: qty 250

## 2013-06-24 MED ORDER — ASPIRIN 81 MG PO CHEW: 81.0000 mg | CHEWABLE_TABLET | ORAL | Status: DC

## 2013-06-24 MED ORDER — NITROGLYCERIN 0.2 MG/ML ON CALL CATH LAB
INTRAVENOUS | Status: AC
  Filled 2013-06-24: qty 1

## 2013-06-24 MED ORDER — SODIUM CHLORIDE 0.9 % IV SOLN
INTRAVENOUS | Status: DC
  Administered 2013-06-24 (×2): via INTRAVENOUS

## 2013-06-24 MED ORDER — SODIUM CHLORIDE 0.9 % IJ SOLN: 3.0000 mL | INTRAMUSCULAR | Status: DC | PRN

## 2013-06-24 MED ORDER — NITROGLYCERIN 0.4 MG SL SUBL
0.4000 mg | SUBLINGUAL_TABLET | SUBLINGUAL | Status: DC | PRN
  Administered 2013-06-24: 0.4 mg via SUBLINGUAL

## 2013-06-24 MED ORDER — SODIUM CHLORIDE 0.9 % IJ SOLN
3.0000 mL | Freq: Two times a day (BID) | INTRAMUSCULAR | Status: DC
  Administered 2013-06-24: 3 mL via INTRAVENOUS

## 2013-06-24 MED ORDER — LIDOCAINE HCL (PF) 1 % IJ SOLN
INTRAMUSCULAR | Status: AC
  Filled 2013-06-24: qty 30

## 2013-06-24 MED ORDER — SODIUM CHLORIDE 0.9 % IV SOLN: 250.0000 mL | INTRAVENOUS | Status: DC | PRN

## 2013-06-24 NOTE — Progress Notes (Signed)
TR BAND REMOVAL  LOCATION:    right radial  DEFLATED PER PROTOCOL:    yes  TIME BAND OFF / DRESSING APPLIED:    1930   SITE UPON ARRIVAL:    Level 0  SITE AFTER BAND REMOVAL:    Level 0  REVERSE ALLEN'S TEST:     positive  CIRCULATION SENSATION AND MOVEMENT:    Within Normal Limits   yes  COMMENTS:   Gauze dressing rechecked at 2000 without change, dressing remains dry and intact
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
## 2013-06-24 NOTE — Progress Notes (Signed)
FPTS Interim progress note  S: patient doing well, currently no chest pain, overall comfortable. She is somewhat anxious about the cath, and is requesting nicotine patch since she smokes 1.5-2 packs a day.   O: BP 132/75  Pulse 77  Temp(Src) 97.9 F (36.6 C) (Oral)  Resp 18  Ht 5\' 3"  (1.6 m)  Wt 137 lb (62.143 kg)  BMI 24.27 kg/m2  SpO2 99%  General: NAD CV: RRR, normal s1/s2, no murmurs. 2+ radial and DP pulses bilaterally Resp: CTAB, normal effort Ext: no edema or cyanosis. WWP. No calf tenderness  A/P: Lynn Harvey is a 48 y.o. female presents with chest pain and found to have rising troponin (i-stat negative, then 0.12, serum this AM is 0.57). Seen by cardiology and planned to go to cardiac cath today.  Tawni Carnes, MD 06/24/2013, 10:22 AM PGY-1, [facility] FPTS Intern Pager: [phone], text pages welcome

## 2013-06-24 NOTE — ED Notes (Signed)
06/23/13:  1800 - cp, from throat down to ant. Chest wall, radiating down left arm.  Some nausea.

## 2013-06-24 NOTE — Progress Notes (Signed)
ANTICOAGULATION CONSULT NOTE - Initial Consult  Pharmacy Consult for Heparin Indication: chest pain/ACS  Allergies  Allergen Reactions  . Morphine And Related Nausea And Vomiting    Patient Measurements: Height: 5\' 3"  (160 cm) Weight: 137 lb (62.143 kg) IBW/kg (Calculated) : 52.4 Heparin Dosing Weight: n/a   Vital Signs: Temp: 97.9 F (36.6 C) (05/06 0613) Temp src: Oral (05/06 0613) BP: 132/75 mmHg (05/06 0613) Pulse Rate: 77 (05/06 0613)  Labs:  Recent Labs  06/24/13 0215 06/24/13 0730  HGB 14.0  --   HCT 40.4  --   PLT 286  --   CREATININE 0.66  --   TROPONINI  --  0.54*    Estimated Creatinine Clearance: 73.5 ml/min (by C-G formula based on Cr of 0.66).   Medical History: Past Medical History  Diagnosis Date  . Hypertension   . Depression   . Insomnia   . Gestational diabetes   . Allergy   . Anxiety   . Rectal abnormality     Medications:  Prescriptions prior to admission  Medication Sig Dispense Refill  . ALPRAZolam (XANAX) 0.5 MG tablet Take 1 tablet (0.5 mg total) by mouth 2 (two) times daily as needed for sleep or anxiety.  60 tablet  0  . amphetamine-dextroamphetamine (ADDERALL) 15 MG tablet Take 1 tablet (15 mg total) by mouth 2 (two) times daily.  60 tablet  0  . buPROPion (WELLBUTRIN XL) 150 MG 24 hr tablet Take 2 tablets (300 mg total) by mouth every morning.  60 tablet  5  . fluticasone (FLONASE) 50 MCG/ACT nasal spray Place 2 sprays into the nose daily.  16 g  6    Assessment: 20 YOF who presented with complaint of severe CP. Troponin was elevated this AM 0.54. Hgb 14, Plt 286. Pharmacy to start heparin drip for ACS. She is planned for a cardiac cath today at 1300. Pt was not on any anticoagulation prior to admission.   Goal of Therapy:  Heparin level 0.3-0.7 units/ml Monitor platelets by anticoagulation protocol: Yes   Plan:  1) Heparin 3000 unit bolus x 1 dose followed by 700 units/hr  2) Monitor daily CBC and s/s of bleeding  3)  F/u after cath   Vinnie Level, PharmD.  Clinical Pharmacist Pager [phone]

## 2013-06-24 NOTE — Progress Notes (Signed)
ANTICOAGULATION CONSULT NOTE - Follow Up Consult  Pharmacy Consult for bivalirudin Indication: s/p cath  Allergies  Allergen Reactions  . Morphine And Related Nausea And Vomiting    Patient Measurements: Height: 5\' 3"  (160 cm) Weight: 137 lb (62.143 kg) IBW/kg (Calculated) : 52.4 Heparin Dosing Weight:   Vital Signs: Temp: 97.6 F (36.4 C) (05/06 1810) Temp src: Oral (05/06 1810) Pulse Rate: 73 (05/06 1325)  Labs:  Recent Labs  06/24/13 0215 06/24/13 0730 06/24/13 1034 06/24/13 1330  HGB 14.0  --  13.3  --   HCT 40.4  --  39.3  --   PLT 286  --  273  --   LABPROT  --   --  13.7  --   INR  --   --  1.07  --   CREATININE 0.66  --  0.70  --   TROPONINI  --  0.54*  --  1.66*    Estimated Creatinine Clearance: 73.5 ml/min (by C-G formula based on Cr of 0.7).   Medications:  Scheduled:  . aspirin EC  325 mg Oral Daily  . aspirin EC  81 mg Oral Daily  . atorvastatin  40 mg Oral q1800  . buPROPion  300 mg Oral Daily  . metoprolol tartrate  12.5 mg Oral BID  . nicotine  21 mg Transdermal Daily  . ticagrelor  90 mg Oral BID   Infusions:  . sodium chloride 75 mL/hr at 06/24/13 1023  . sodium chloride 800 mL (06/24/13 1538)  . heparin Stopped (06/24/13 1252)    Assessment: 48 yo female s/p cath will be continued on bivalirudin until current bag is out.    Goal of Therapy:   Monitor platelets by anticoagulation protocol: Yes   Plan:  1) Continue current bag of bivalirudin at 0.25 mg/kg/hr until current bag is out. Pharmacy will sign off  Tsz-Yin Habib Kise 06/24/2013,6:25 PM

## 2013-06-24 NOTE — Progress Notes (Signed)
Utilization review completed.  

## 2013-06-24 NOTE — H&P (Signed)
[facility]MTS ATTENDING ADMISSION NOTE Lynn Eniola,MD I  have seen and examined this patient, reviewed their chart. I have discussed this patient with the resident. I agree with the resident's findings, assessment and care plan.  48 Y/O F with PMX of HTN,HLD, tobacco use, depression, HLD, presented to the ED with hx of substernal chest pain radiating to her left arm and neck, pain was initially sharp in nature then became pressure like about 7/10 in severity, she denies any aggravating factor although she has been stressed lately at work and at home. There was associated excessive sweating,and SOB,no cough.She stated the chest pain woke her up from sleep.She denies prior episode of similar pain in the past. She endorsed family hx of cardiac disease in her family.  No current facility-administered medications on file prior to encounter.   Current Outpatient Prescriptions on File Prior to Encounter  Medication Sig Dispense Refill  . ALPRAZolam (XANAX) 0.5 MG tablet Take 1 tablet (0.5 mg total) by mouth 2 (two) times daily as needed for sleep or anxiety.  60 tablet  0  . amphetamine-dextroamphetamine (ADDERALL) 15 MG tablet Take 1 tablet (15 mg total) by mouth 2 (two) times daily.  60 tablet  0  . buPROPion (WELLBUTRIN XL) 150 MG 24 hr tablet Take 2 tablets (300 mg total) by mouth every morning.  60 tablet  5  . fluticasone (FLONASE) 50 MCG/ACT nasal spray Place 2 sprays into the nose daily.  16 g  6   Past Medical History  Diagnosis Date  . Hypertension   . Depression   . Insomnia   . Gestational diabetes   . Allergy   . Anxiety   . Rectal abnormality    Filed Vitals:   06/24/13 0431 06/24/13 0502 06/24/13 0530 06/24/13 0613  BP: 129/82 111/54 107/55 132/75  Pulse: 89 72 74 77  Temp:    97.9 F (36.6 C)  TempSrc:    Oral  Resp: 16 18 19 18   Height:    5\' 3"  (1.6 m)  Weight:    137 lb (62.143 kg)  SpO2: 97% 98% 97% 99%   Exam: Gen: Calm in bed, not in distress. HEENT: EOMI,  PERRLA. Neuro: Awake and alert, oriented x 3. Resp: Air entry equal and clear b/L. CV: S1 S2 normal, no murmur. Abd: Soft, NT/ND, BS normal. Ext: No edema.  A/P: 48 Y/O F with chest pain.         EKG done in the ED normal.        Initial iSTAT negative but repeat increased to 0.12       Plan to obtain serum troponin.       Repeat EKG, monitor cardiac activity on telemetry.       Risk stratification lab: TSH,A1C, BMET,Lipid Profile.       ASA, Lipitor and Betablocker.       May need to be started on heparin if troponin continues to rise.       Nitroglycerin prn chest pain.       Cardiology consult.

## 2013-06-24 NOTE — ED Provider Notes (Signed)
CSN: [id number]     Arrival date & time 06/24/13  0139 History   First MD Initiated Contact with Patient 06/24/13 0158     Chief Complaint  Patient presents with  . Chest Pain     (Consider location/radiation/quality/duration/timing/severity/associated sxs/prior Treatment) Patient is a 48 y.o. female presenting with chest pain. The history is provided by the patient.  Chest Pain She onset at about 6 PM of midsternal and left anterior chest pain which she thought was acid reflux. She took a dose of ranitidine with no relief. Pain got gradually worse. Pain was rated at 7/10 at its worst but is now improved to 3/10. She did give her self aspirin at home. Pain is worse when supine and better when sitting up. There stated dyspnea or diaphoresis. There is mild nausea. There is some radiation of pain toward her neck and into her left arm. She has not had pain like this before. She does have significant cardiac risk factors of hypertension, hyperlipidemia, smoking, and family history of premature coronary atherosclerosis. She has history of gestational diabetes.  Past Medical History  Diagnosis Date  . Hypertension   . Depression   . Insomnia   . Gestational diabetes   . Allergy   . Anxiety   . Rectal abnormality    Past Surgical History  Procedure Laterality Date  . Wrist surgery  1987    RIGHT; bone from forearm grafted to wrist  . Abdominal hysterectomy    . Tonsilectomy, adenoidectomy, bilateral myringotomy and tubes    . Colon surgery     Family History  Problem Relation Age of Onset  . Cancer Mother   . COPD Mother     emphysema  . Heart disease Father 42    s/p 3V CABG  . Depression Father   . Mental illness Sister     depression  . Depression Sister   . Mental illness Son     ADHD, bipolar disorder  . Diabetes Maternal Grandmother   . Heart disease Maternal Grandmother   . Diabetes Maternal Grandfather   . Cancer Maternal Grandfather     lung cancer  . Diabetes  Paternal Grandmother   . Kidney disease Paternal Grandmother   . Diabetes Paternal Grandfather   . Heart disease Paternal Grandfather    History  Substance Use Topics  . Smoking status: Current Every Day Smoker -- 2.00 packs/day for 20 years    Types: Cigarettes    Start date: 12/25/1982  . Smokeless tobacco: Never Used  . Alcohol Use: 14.4 oz/week    24 Cans of beer per week     Comment: more since fall 2012   OB History   Grav Para Term Preterm Abortions TAB SAB Ect Mult Living   5 3 2 1 2  0 1 1 0 3     Review of Systems  Cardiovascular: Positive for chest pain.  All other systems reviewed and are negative.     Allergies  Review of patient's allergies indicates no known allergies.  Home Medications   Prior to Admission medications   Medication Sig Start Date End Date Taking? Authorizing Provider  ALPRAZolam Prudy Feeler) 0.5 MG tablet Take 1 tablet (0.5 mg total) by mouth 2 (two) times daily as needed for sleep or anxiety.    [name] S Jeffery, PA-C  amphetamine-dextroamphetamine (ADDERALL) 15 MG tablet Take 1 tablet (15 mg total) by mouth 2 (two) times daily. 05/18/13   [name] S Jeffery, PA-C  buPROPion (WELLBUTRIN XL) 150  MG 24 hr tablet Take 2 tablets (300 mg total) by mouth every morning. 04/23/13   [name] S Jeffery, PA-C  fluticasone (FLONASE) 50 MCG/ACT nasal spray Place 2 sprays into the nose daily. 04/17/12   Morrell RiddleSarah L Weber, PA-C   BP 159/82  Pulse 80  Temp(Src) 98.4 F (36.9 C) (Oral)  Resp 20  Ht 5\' 3"  (1.6 m)  Wt 135 lb (61.236 kg)  BMI 23.92 kg/m2  SpO2 97% Physical Exam  Nursing note and vitals reviewed.  48 year old female, resting comfortably and in no acute distress. Vital signs are significant for hypertension with blood pressure 139/827. Oxygen saturation is 97%, which is normal. Head is normocephalic and atraumatic. PERRLA, EOMI. Oropharynx is clear. Neck is nontender and supple without adenopathy or JVD. Back is nontender and there is no CVA  tenderness. Lungs are clear without rales, wheezes, or rhonchi. Chest is nontender. Heart has regular rate and rhythm without murmur. Abdomen is soft, flat,  with mild epigastric tenderness. There is no right upper quadrant tenderness and negative Murphy sign. There is no rebound or guarding. There are no  masses or hepatosplenomegaly and peristalsis is normoactive. Extremities have no cyanosis or edema, full range of motion is present. Skin is warm and dry without rash. Neurologic: Mental status is normal, cranial nerves are intact, there are no motor or sensory deficits.  ED Course  Procedures (including critical care time) Labs Review Results for orders placed during the hospital encounter of 06/24/13  CBC      Result Value Ref Range   WBC 9.7  4.0 - 10.5 K/uL   RBC 4.43  3.87 - 5.11 MIL/uL   Hemoglobin 14.0  12.0 - 15.0 g/dL   HCT 82.940.4  56.236.0 - 13.046.0 %   MCV 91.2  78.0 - 100.0 fL   MCH 31.6  26.0 - 34.0 pg   MCHC 34.7  30.0 - 36.0 g/dL   RDW 86.513.5  78.411.5 - 69.615.5 %   Platelets 286  150 - 400 K/uL  BASIC METABOLIC PANEL      Result Value Ref Range   Sodium 138  137 - 147 mEq/L   Potassium 4.0  3.7 - 5.3 mEq/L   Chloride 102  96 - 112 mEq/L   CO2 23  19 - 32 mEq/L   Glucose, Bld 103 (*) 70 - 99 mg/dL   BUN 5 (*) 6 - 23 mg/dL   Creatinine, Ser 2.950.66  0.50 - 1.10 mg/dL   Calcium 9.2  8.4 - 28.410.5 mg/dL   GFR calc non Af Amer >90  >90 mL/min   GFR calc Af Amer >90  >90 mL/min  DIFFERENTIAL      Result Value Ref Range   Neutrophils Relative % 69  43 - 77 %   Neutro Abs 6.6  1.7 - 7.7 K/uL   Lymphocytes Relative 25  12 - 46 %   Lymphs Abs 2.5  0.7 - 4.0 K/uL   Monocytes Relative 5  3 - 12 %   Monocytes Absolute 0.5  0.1 - 1.0 K/uL   Eosinophils Relative 1  0 - 5 %   Eosinophils Absolute 0.1  0.0 - 0.7 K/uL   Basophils Relative 0  0 - 1 %   Basophils Absolute 0.0  0.0 - 0.1 K/uL  I-STAT TROPOININ, ED      Result Value Ref Range   Troponin i, poc 0.02  0.00 - 0.08 ng/mL   Comment  3  EKG Interpretation   Date/Time:  Wednesday Jun 24 2013 01:42:55 EDT Ventricular Rate:  85 PR Interval:  154 QRS Duration: 88 QT Interval:  366 QTC Calculation: 435 R Axis:   75 Text Interpretation:  Normal sinus rhythm Normal ECG No old tracing to  compare Confirmed by [facility]  MD, Abriel Hattery ([id number]) on 06/24/2013 1:58:42 AM      MDM   Final diagnoses:  Chest pain    Chest pain of uncertain cause. It seems most likely to be GI with possible GERD in spite of lack of response to ranitidine. ECG is normal. She is alert he received aspirin at home and she will be given a dose of a GI cocktail in the ED. With her risk factors, she will need additional cardiac evaluation such as stress testing. Evaluation is proceeding to see whether she can be discharged to have above-noted evaluation as an outpatient or whether she will need to be admitted.  She got minimal relief from GI cocktail. She was given a dose of sublingual nitroglycerin with complete relief of her pain. Troponin has come back normal but she will need to be admitted for serial troponins and consideration for stress testing or cardiac CT scanning. Case is discussed with Dr. Gwenlyn Saran of family practice service who agrees to admit the patient under observation status.  Dione Booze, MD 06/24/13 [phone]

## 2013-06-24 NOTE — Consult Note (Signed)
Admit date: 06/24/2013 Referring Physician  Dr. Fletke  Primary Physician  [name] Jeffery PA-C Primary Cardiologist  NONE Reason for Consultation  Chest pain  HPI: This is a 48yo female with a 30 year history of tobacco use, dyslipidemia and ETOH abuse who presented with complaints of chest pain.  She started having chest tightness at work yesterday around 6PM while sitting.  She thought it was indigestion and she went home and took a Zantac. The discomfort continued but she till thought it was indigestion and she sent to bed.  Around 12 MN she awakened with severe CP that was radiating up into her neck and jaw associated with diaphoresis, nausea and SOB.  Her husband brought her to the ER and the pain had already started to subside.  She was given a GI cocktail which she says did not really help.  She then was given SL NTG and eventually the pain resolved. Her initial troponin was negative but she has since ruled in.  Cardiology is now asked to consult.  She currently is pain free.  She has a strong family history of CAD with her dad having an MI in his early 50's.    PMH:   Past Medical History  Diagnosis Date  . Hypertension   . Depression   . Insomnia   . Gestational diabetes   . Allergy   . Anxiety   . Rectal abnormality      PSH:   Past Surgical History  Procedure Laterality Date  . Wrist surgery  1987    RIGHT; bone from forearm grafted to wrist  . Abdominal hysterectomy    . Tonsilectomy, adenoidectomy, bilateral myringotomy and tubes    . Colon surgery      Allergies:  Morphine and related Prior to Admit Meds:   Prescriptions prior to admission  Medication Sig Dispense Refill  . ALPRAZolam (XANAX) 0.5 MG tablet Take 1 tablet (0.5 mg total) by mouth 2 (two) times daily as needed for sleep or anxiety.  60 tablet  0  . amphetamine-dextroamphetamine (ADDERALL) 15 MG tablet Take 1 tablet (15 mg total) by mouth 2 (two) times daily.  60 tablet  0  . buPROPion (WELLBUTRIN XL) 150  MG 24 hr tablet Take 2 tablets (300 mg total) by mouth every morning.  60 tablet  5  . fluticasone (FLONASE) 50 MCG/ACT nasal spray Place 2 sprays into the nose daily.  16 g  6   Fam HX:    Family History  Problem Relation Age of Onset  . Cancer Mother   . COPD Mother     emphysema  . Heart disease Father 50    s/p 3V CABG  . Depression Father   . Mental illness Sister     depression  . Depression Sister   . Mental illness Son     ADHD, bipolar disorder  . Diabetes Maternal Grandmother   . Heart disease Maternal Grandmother   . Diabetes Maternal Grandfather   . Cancer Maternal Grandfather     lung cancer  . Diabetes Paternal Grandmother   . Kidney disease Paternal Grandmother   . Diabetes Paternal Grandfather   . Heart disease Paternal Grandfather    Social HX:    History   Social History  . Marital Status: Married    Spouse Name: Rodney    Number of Children: 3  . Years of Education: 12   Occupational History  . CUSTOMER SERVICE [employer]   Social History Main   Topics  . Smoking status: Current Every Day Smoker -- 2.00 packs/day for 20 years    Types: Cigarettes    Start date: 12/25/1982  . Smokeless tobacco: Never Used  . Alcohol Use: 14.4 oz/week    24 Cans of beer per week     Comment: more since fall 2012  . Drug Use: No  . Sexual Activity: Yes    Partners: Male    Birth Control/ Protection: Surgical   Other Topics Concern  . Not on file   Social History Narrative   Lives with her husband, their son Hunter, and her daughter Amber and Amber's son Liam.     ROS:  All 11 ROS were addressed and are negative except what is stated in the HPI  Physical Exam: Blood pressure 132/75, pulse 77, temperature 97.9 F (36.6 C), temperature source Oral, resp. rate 18, height 5' 3" (1.6 m), weight 137 lb (62.143 kg), SpO2 99.00%.    General: Well developed, well nourished, in no acute distress Head: Eyes PERRLA, No xanthomas.   Normal cephalic and  atramatic  Lungs:   Clear bilaterally to auscultation and percussion. Heart:   HRRR S1 S2 Pulses are 2+ & equal.            No carotid bruit. No JVD.  No abdominal bruits. No femoral bruits. Abdomen: Bowel sounds are positive, abdomen soft and non-tender without masses  Extremities:   No clubbing, cyanosis or edema.  DP +1 Neuro: Alert and oriented X 3. Psych:  Good affect, responds appropriately    Labs:   Lab Results  Component Value Date   WBC 9.7 06/24/2013   HGB 14.0 06/24/2013   HCT 40.4 06/24/2013   MCV 91.2 06/24/2013   PLT 286 06/24/2013    Recent Labs Lab 06/24/13 0215  NA 138  K 4.0  CL 102  CO2 23  BUN 5*  CREATININE 0.66  CALCIUM 9.2  GLUCOSE 103*   No results found for this basename: PTT   No results found for this basename: INR, PROTIME   Lab Results  Component Value Date   TROPONINI 0.54* 06/24/2013     Lab Results  Component Value Date   CHOL 168 06/24/2013   CHOL 179 10/28/2012   CHOL 229* 06/10/2012   Lab Results  Component Value Date   HDL 39* 06/24/2013   HDL 40 10/28/2012   HDL 44 06/10/2012   Lab Results  Component Value Date   LDLCALC 108* 06/24/2013   LDLCALC 101* 10/28/2012   LDLCALC 132* 06/10/2012   Lab Results  Component Value Date   TRIG 103 06/24/2013   TRIG 189* 10/28/2012   TRIG 263* 06/10/2012   Lab Results  Component Value Date   CHOLHDL 4.3 06/24/2013   CHOLHDL 4.5 10/28/2012   CHOLHDL 5.2 06/10/2012   No results found for this basename: LDLDIRECT      Radiology:  No results found.  EKG:  NSR with no ST changes and normal intervals  ASSESSMENT:  1.  NSTEMI - currently pain free but had pain for at least 6 hours yesterday.  EKG nonischemic.  CRF include ongoing tobacco use, dyslipidemia and family history of early CAD.   2.  Dyslipidemia 3.  Ongoing tobacco abuse 4.  ADHD 5.  Depression 6.  ETOH abuse  PLAN:   1.  Cardiac cath today - Cardiac catheterization was discussed with the patient fully including risks on myocardial  infarction, death, stroke, bleeding, arrhythmia, dye allergy, renal insufficiency   or bleeding.  All patient questions and concerns were discussed and the patient understands and is willing to proceed.   2.  Change SQ to IV Heparin gtt per pharmacy 3.  ASA 4.  Lipitor 40mg daily 5.  Smoking cessation counseling 6.  Lopressor 12.5mg BID 7. Check 2D echo to assess LVF  Holdyn Poyser R Jrue Jarriel, MD  06/24/2013  8:54 AM    

## 2013-06-24 NOTE — H&P (View-Only) (Signed)
Admit date: 06/24/2013 Referring Physician  Dr. Randolm IdolFletke  Primary Physician  Porfirio Oarhelle Jeffery PA-C Primary Cardiologist  NONE Reason for Consultation  Chest pain  HPI: This is a 48yo female with a 30 year history of tobacco use, dyslipidemia and ETOH abuse who presented with complaints of chest pain.  She started having chest tightness at work yesterday around [employer]6PM while sitting.  She thought it was indigestion and she went home and took a Zantac. The discomfort continued but she till thought it was indigestion and she sent to bed.  Around 12 MN she awakened with severe CP that was radiating up into her neck and jaw associated with diaphoresis, nausea and SOB.  Her husband brought her to the ER and the pain had already started to subside.  She was given a GI cocktail which she says did not really help.  She then was given SL NTG and eventually the pain resolved. Her initial troponin was negative but she has since ruled in.  Cardiology is now asked to consult.  She currently is pain free.  She has a strong family history of CAD with her dad having an MI in his early 350's.    PMH:   Past Medical History  Diagnosis Date  . Hypertension   . Depression   . Insomnia   . Gestational diabetes   . Allergy   . Anxiety   . Rectal abnormality      PSH:   Past Surgical History  Procedure Laterality Date  . Wrist surgery  1987    RIGHT; bone from forearm grafted to wrist  . Abdominal hysterectomy    . Tonsilectomy, adenoidectomy, bilateral myringotomy and tubes    . Colon surgery      Allergies:  Morphine and related Prior to Admit Meds:   Prescriptions prior to admission  Medication Sig Dispense Refill  . ALPRAZolam (XANAX) 0.5 MG tablet Take 1 tablet (0.5 mg total) by mouth 2 (two) times daily as needed for sleep or anxiety.  60 tablet  0  . amphetamine-dextroamphetamine (ADDERALL) 15 MG tablet Take 1 tablet (15 mg total) by mouth 2 (two) times daily.  60 tablet  0  . buPROPion (WELLBUTRIN XL) 150  MG 24 hr tablet Take 2 tablets (300 mg total) by mouth every morning.  60 tablet  5  . fluticasone (FLONASE) 50 MCG/ACT nasal spray Place 2 sprays into the nose daily.  16 g  6   Fam HX:    Family History  Problem Relation Age of Onset  . Cancer Mother   . COPD Mother     emphysema  . Heart disease Father 3550    s/p 3V CABG  . Depression Father   . Mental illness Sister     depression  . Depression Sister   . Mental illness Son     ADHD, bipolar disorder  . Diabetes Maternal Grandmother   . Heart disease Maternal Grandmother   . Diabetes Maternal Grandfather   . Cancer Maternal Grandfather     lung cancer  . Diabetes Paternal Grandmother   . Kidney disease Paternal Grandmother   . Diabetes Paternal Grandfather   . Heart disease Paternal Grandfather    Social HX:    History   Social History  . Marital Status: Married    Spouse Name: Thereasa DistanceRodney    Number of Children: 3  . Years of Education: 12   Occupational History  . CUSTOMER SERVICE [employer]   Social History Main  Topics  . Smoking status: Current Every Day Smoker -- 2.00 packs/day for 20 years    Types: Cigarettes    Start date: 12/25/1982  . Smokeless tobacco: Never Used  . Alcohol Use: 14.4 oz/week    24 Cans of beer per week     Comment: more since fall 2012  . Drug Use: No  . Sexual Activity: Yes    Partners: Male    Birth Control/ Protection: Surgical   Other Topics Concern  . Not on file   Social History Narrative   Lives with her husband, their son [name], and her daughter [name] and Amber's son [name].     ROS:  All 11 ROS were addressed and are negative except what is stated in the HPI  Physical Exam: Blood pressure 132/75, pulse 77, temperature 97.9 F (36.6 C), temperature source Oral, resp. rate 18, height 5\' 3"  (1.6 m), weight 137 lb (62.143 kg), SpO2 99.00%.    General: Well developed, well nourished, in no acute distress Head: Eyes PERRLA, No xanthomas.   Normal cephalic and  atramatic  Lungs:   Clear bilaterally to auscultation and percussion. Heart:   HRRR S1 S2 Pulses are 2+ & equal.            No carotid bruit. No JVD.  No abdominal bruits. No femoral bruits. Abdomen: Bowel sounds are positive, abdomen soft and non-tender without masses  Extremities:   No clubbing, cyanosis or edema.  DP +1 Neuro: Alert and oriented X 3. Psych:  Good affect, responds appropriately    Labs:   Lab Results  Component Value Date   WBC 9.7 06/24/2013   HGB 14.0 06/24/2013   HCT 40.4 06/24/2013   MCV 91.2 06/24/2013   PLT 286 06/24/2013    Recent Labs Lab 06/24/13 0215  NA 138  K 4.0  CL 102  CO2 23  BUN 5*  CREATININE 0.66  CALCIUM 9.2  GLUCOSE 103*   No results found for this basename: PTT   No results found for this basename: INR, PROTIME   Lab Results  Component Value Date   TROPONINI 0.54* 06/24/2013     Lab Results  Component Value Date   CHOL 168 06/24/2013   CHOL 179 10/28/2012   CHOL 229* 06/10/2012   Lab Results  Component Value Date   HDL 39* 06/24/2013   HDL 40 09/22/2547   HDL 44 06/10/2012   Lab Results  Component Value Date   LDLCALC 108* 06/24/2013   LDLCALC 101* 10/28/2012   LDLCALC 132* 06/10/2012   Lab Results  Component Value Date   TRIG 103 06/24/2013   TRIG 189* 10/28/2012   TRIG 263* 06/10/2012   Lab Results  Component Value Date   CHOLHDL 4.3 06/24/2013   CHOLHDL 4.5 10/28/2012   CHOLHDL 5.2 06/10/2012   No results found for this basename: LDLDIRECT      Radiology:  No results found.  EKG:  NSR with no ST changes and normal intervals  ASSESSMENT:  1.  NSTEMI - currently pain free but had pain for at least 6 hours yesterday.  EKG nonischemic.  CRF include ongoing tobacco use, dyslipidemia and family history of early CAD.   2.  Dyslipidemia 3.  Ongoing tobacco abuse 4.  ADHD 5.  Depression 6.  ETOH abuse  PLAN:   1.  Cardiac cath today - Cardiac catheterization was discussed with the patient fully including risks on myocardial  infarction, death, stroke, bleeding, arrhythmia, dye allergy, renal insufficiency  or bleeding.  All patient questions and concerns were discussed and the patient understands and is willing to proceed.   2.  Change SQ to IV Heparin gtt per pharmacy 3.  ASA 4.  Lipitor 40mg  daily 5.  Smoking cessation counseling 6.  Lopressor 12.5mg  BID 7. Check 2D echo to assess LVF  Quintella Reichert, MD  06/24/2013  8:54 AM

## 2013-06-24 NOTE — Interval H&P Note (Signed)
Cath Lab Visit (complete for each Cath Lab visit)  Clinical Evaluation Leading to the Procedure:   ACS: yes  Non-ACS:    Anginal Classification: CCS III  Anti-ischemic medical therapy: Minimal Therapy (1 class of medications)  Non-Invasive Test Results: No non-invasive testing performed  Prior CABG: No previous CABG      History and Physical Interval Note:  06/24/2013 1:39 PM  Lynn Harvey  has presented today for surgery, with the diagnosis of cp  The various methods of treatment have been discussed with the patient and family. After consideration of risks, benefits and other options for treatment, the patient has consented to  Procedure(s): LEFT HEART CATHETERIZATION WITH CORONARY ANGIOGRAM (N/A) as a surgical intervention .  The patient's history has been reviewed, patient examined, no change in status, stable for surgery.  I have reviewed the patient's chart and labs.  Questions were answered to the patient's satisfaction.     [name]

## 2013-06-24 NOTE — CV Procedure (Signed)
Lynn Harvey is a 48 y.o. female   [id number]  [id number] LOCATION:  FACILITY: [facility]  PHYSICIAN: Lennette Biharihomas A. Parneet Glantz, MD, [facility] 08/01/1965   DATE OF PROCEDURE:  06/24/2013      CARDIAC CATHETERIZATION/PERCUTANEOUS CORONARY INTERVENTION    HISTORY:  Lynn Harvey is a 48 year old female who has a 30 year history of tobacco use, and recently has been smoking one and a half to 2 packs per day.  There is a strong family history for coronary artery disease.  She was admitted this morning after experiencing severe chest pain which awakened her from sleep.  Troponin is positive for non-ST segment elevation myocardial infarction.  She presents now for cardiac catheterization.   PROCEDURE: R radial approach.  Left heart catheterization: Coronary angiography; left ventriculography; percutaneous coronary intervention with Angiosculpt scoring balloon to the LAD  The patient was brought to the second floor [place] [facility]iac cath lab in the postabsorptive state. The patient was premedicated with Versed 2 mg and fentanyl 50 mcg. A right radial approach was utilized after an Allen's test verified adequate circulation. The right radial artery was punctured via the Seldinger technique, and a 6 JamaicaFrench Glidesheath Slender was inserted without difficulty.  A radial cocktail consisting of Verapamil, IV nitroglycerin, and lidocaine was administered. Weight adjusted heparin was administered. A safety J wire was advanced into the ascending aorta. Diagnostic catheterization was done with 5 JamaicaFrench JR 4 and JL 3.5 catheters. A 5 French pigtail catheter was used for left ventriculography.  The demonstration of a focal 85% stenosis immediately after the diagonal branch of the LAD, the decision was made to attempt intervention.  Due to the stenosis proximal to the septal and diagonal.  Decision was made not to stent and JL the diagonal, but to perform a cutting balloon procedure rather than stenting and gelling.  The  diagonal vessel and not placing the stent in the more proximal narrowing.  Angiomax bolus plus infusion was administered.  With the patient ruling in for a non-ST segment elevation MI/ACS with positive troponin and anterolateral wall motion abnormality Brilinta 180 mg was administered orally for antiplatelet therapy.  After documenting  therapeutic anticoagulation,  a FL3.5 guide was inserted.  A primary wire was advanced down the LAD.  Angiosculpt cutting balloon was then inserted 2.5x10 mm and 3 slow dilatations at 5, 6, and up to 8 atmospheres were administered.  [facility]out angiography confirmed an excellent angiographic result. A TR radial band was applied for hemostasis. The patient left the catheterization laboratory in stable condition.   HEMODYNAMICS:   Central Aorta: 120/66  Left Ventricle: 120/11  ANGIOGRAPHY:   The left main coronary artery was angiographically normal vesse which was short and immediately bifurcated into the LAD and left circumflex coronary artery.  The LAD was a moderate size vessel with proximal calcification.  There was a 30% stenosis proximally prior to the takeoff of the septal perforating artery and just after the septal takeoff was a diagonal vessel.  There was an 85% stenosis in the LAD immediately distal and extending up to the diagonal takeoff.  The remainder of the LAD was free of significant disease.   The left circumflex coronary artery was anatomically normal and gave rise to two major bifurcating obtuse marginal branch.   The RCA was angiographically normal it gave rise to a large PDA and PLA vessel.   Left ventriculography revealed low normal LV function with moderate anterolateral hypocontractility . Ejection fraction is 50%    Following  successful percutaneous coronary intervention to the LAD with angioscoped cutting balloon.  The 85% stenosis was reduced to 0%.  There was no evidence for dissection.  There is no evidence for plaque shifting into the  diagonal vessel.   IMPRESSION:  Acute coronary syndrome resulting in low normal acute LV dysfunction with mild to moderate anterolateral hypocontractility and ejection fraction of 50%.  Single-vessel coronary artery disease with proximal LAD calcification, proximal 30% narrowing before the septal perforator and diagonal vessel with focal 85% stenosis just distal and up to the diagonal vessel.  Normal left circumflex and right coronary arteries.  Successful percutaneous coronary intervention with cutting balloon Angiosculpt to the LAD with the 85% stenosis being reduced to 0%, with TIMI-3 flow, and no evidence for dissection  RECOMMENDATION:  Initial treatment with dual antiplatelet therapy in light of her ACS, beta blocker, ACE-I, statin, and smoking cessation.  Since an excellent result was obtained with Angiosculpt scoring cutting balloon, the LAD was not stented, so as not to jail the diagonal vessel and  not to land the stent in the proximal mild-to-moderate LAD disease segment.   [name], MD, [facility] 06/24/2013 3:43 PM

## 2013-06-25 ENCOUNTER — Ambulatory Visit: Payer: 59 | Admitting: Physician Assistant

## 2013-06-25 DIAGNOSIS — I10 Essential (primary) hypertension: Secondary | ICD-10-CM

## 2013-06-25 DIAGNOSIS — E7800 Pure hypercholesterolemia, unspecified: Secondary | ICD-10-CM

## 2013-06-25 DIAGNOSIS — F17200 Nicotine dependence, unspecified, uncomplicated: Secondary | ICD-10-CM

## 2013-06-25 LAB — CBC
HCT: 38.9 % (ref 36.0–46.0)
Hemoglobin: 13 g/dL (ref 12.0–15.0)
MCH: 30.7 pg (ref 26.0–34.0)
MCHC: 33.4 g/dL (ref 30.0–36.0)
MCV: 92 fL (ref 78.0–100.0)
Platelets: 270 10*3/uL (ref 150–400)
RBC: 4.23 MIL/uL (ref 3.87–5.11)
RDW: 13.6 % (ref 11.5–15.5)
WBC: 8 10*3/uL (ref 4.0–10.5)

## 2013-06-25 LAB — BASIC METABOLIC PANEL
BUN: 8 mg/dL (ref 6–23)
CHLORIDE: 108 meq/L (ref 96–112)
CO2: 22 meq/L (ref 19–32)
CREATININE: 0.81 mg/dL (ref 0.50–1.10)
Calcium: 8.2 mg/dL — ABNORMAL LOW (ref 8.4–10.5)
GFR calc non Af Amer: 86 mL/min — ABNORMAL LOW (ref >90)
Glucose, Bld: 86 mg/dL (ref 70–99)
POTASSIUM: 3.9 meq/L (ref 3.7–5.3)
SODIUM: 141 meq/L (ref 137–147)

## 2013-06-25 MED ORDER — ALPRAZOLAM 0.5 MG PO TABS
0.5000 mg | ORAL_TABLET | Freq: Every evening | ORAL | Status: DC | PRN
  Administered 2013-06-25: 0.5 mg via ORAL
  Filled 2013-06-25: qty 1

## 2013-06-25 MED ORDER — LISINOPRIL 2.5 MG PO TABS
2.5000 mg | ORAL_TABLET | Freq: Every day | ORAL | Status: DC
  Administered 2013-06-25 – 2013-06-26 (×2): 2.5 mg via ORAL
  Filled 2013-06-25 (×2): qty 1

## 2013-06-25 MED ORDER — ALPRAZOLAM 0.25 MG PO TABS
0.2500 mg | ORAL_TABLET | Freq: Every morning | ORAL | Status: DC
  Administered 2013-06-25 – 2013-06-26 (×2): 0.25 mg via ORAL
  Filled 2013-06-25 (×2): qty 1

## 2013-06-25 MED FILL — Sodium Chloride IV Soln 0.9%: INTRAVENOUS | Qty: 50 | Status: AC

## 2013-06-25 NOTE — Progress Notes (Signed)
CARDIAC REHAB PHASE I   PRE:  Rate/Rhythm: 81 SR  BP:  Supine: 131/68  Sitting:   Standing:    SaO2:   MODE:  Ambulation: 800 ft   POST:  Rate/Rhythm: 93 SR  BP:  Supine:   Sitting: 149/71  Standing:    SaO2:  0750-0900 Pt walked 800 ft with steady gait. Tolerated well. No CP. MI ed completed with pt and husband. Discussed smoking cessation and gave handouts. Husband plans to quit with pt. Encouraged them to call 1800quitnow as needed for coaching. Gave brilinta packet. Discussed CRP 2 and pt gave permission to refer to [place]. When asked about stress pt became tearful. Emotional support given. Discussed need to decrease alcohol. Pt voiced understanding of ed.   Luetta Nutting, RN BSN  06/25/2013 8:55 AM

## 2013-06-25 NOTE — Progress Notes (Signed)
Family Medicine Teaching Service Daily Progress Note Intern Pager: [phone]  Patient name: Lynn Harvey Medical record number: [id number] Date of birth: May 13, 1965 Age: 49 y.o. Gender: female  Primary Care Provider: JEFFERY,[name], PA-C Consultants: Cardiology Code Status: Full  Pt Overview and Major Events to Date:  5/6: Cardiac cath  Assessment and Plan: Lynn Harvey is a 48 y.o. female presenting with chest pain. PMH is significant for HLD, ADHD, Depression and tobacco abuse.   #NSTEMI, s/p PCI: Troponin increased yesterday 0.12 (istat) to 0.57, taken to cath lab with 85% distal stenosis of LAD, reduced to 0% after PCI (no stents) - risk stratification labs: Hgb A1c (5.7), TSH (1.76) , lipid panel (TC 168, TG 103, HDL 39, LDL 108) - cardiology consult and recs appreciated - daily ASA and brilinta BID for dual antiplatelet - metoprolol 12.5mg  BID - lisinopril 2.5mg  daily  #HLD: previously on Lipitor but was stopped, not due to any side effects. Last LDL of 101 in 10/2012.  - lipitor 40mg   #ADHD: currently controlled  - Adderall 15 mg BID   #Depression: appears stable, will continue home medications  - Wellbutrin xl, xanax   #Tobacco abuse: smoker for 30 years with 1.5 PPD  - nicotine patch  - smoking cessation counseling  #EtOH abuse: will go through 2 cases of beer in a weekend with her husband.  - possible need for CIWA if patient is admitted for longer course   FEN/GI: NS at 75cc/hr, heart healthy diet Prophylaxis: heparin subQ  Disposition: pending clinical improvement  Subjective:  Doing well, no complaints of CP, SOB, dizziness/lightheadedness. Just went for a walk.   Objective: Temp:  [97.6 F (36.4 C)-98.3 F (36.8 C)] 98.3 F (36.8 C) (05/07 0626) Pulse Rate:  [71-80] 75 (05/07 0626) Resp:  [16-20] 20 (05/07 0626) BP: (118-133)/(70-74) 125/70 mmHg (05/07 0626) SpO2:  [97 %-100 %] 98 % (05/07 0626) Weight:  [135 lb 9.3 oz (61.5 kg)] 135 lb 9.3 oz  (61.5 kg) (05/07 0100) Physical Exam: General: NAD, standing up and walking from bathroom Cardiovascular: RRR, normal s1/s2, no murmurs. 2+ PT pulses b/l Respiratory: CTAB, normal effort Abdomen: soft, NTND Extremities: no edema or cyanosis. WWP.  Skin: bandage right wrist clean/dry/intact from cath insertion Neuro: alert and oriented, CN2-12 normal. Strength 5/5 grip, toe flexion/extension bilaterally.  Laboratory:  Recent Labs Lab 06/24/13 0215 06/24/13 1034 06/25/13 0354  WBC 9.7 6.7 8.0  HGB 14.0 13.3 13.0  HCT 40.4 39.3 38.9  PLT 286 273 270    Recent Labs Lab 06/24/13 0215 06/24/13 1034 06/25/13 0354  NA 138 138 141  K 4.0 4.6 3.9  CL 102 102 108  CO2 23 23 22   BUN 5* 6 8  CREATININE 0.66 0.70 0.81  CALCIUM 9.2 9.0 8.2*  GLUCOSE 103* 92 86   Troponin 0.12 (istat) > 0.54 > 1.66 Hgb A1c 5.7 TSH 1.760 Lipid Panel     Component Value Date/Time   CHOL 168 06/24/2013 0730   TRIG 103 06/24/2013 0730   HDL 39* 06/24/2013 0730   CHOLHDL 4.3 06/24/2013 0730   VLDL 21 06/24/2013 0730   LDLCALC 108* 06/24/2013 0730   Platelet inhibition p2y12 310 (normal 194-418)   Imaging/Diagnostic Tests:  Cardiac cath impression: Acute coronary syndrome resulting in low normal acute LV dysfunction with mild to moderate anterolateral hypocontractility and ejection fraction of 50%.  Single-vessel coronary artery disease with proximal LAD calcification, proximal 30% narrowing before the septal perforator and diagonal vessel with focal 85% stenosis  just distal and up to the diagonal vessel.  Normal left circumflex and right coronary arteries.  Successful percutaneous coronary intervention with cutting balloon Angiosculpt to the LAD with the 85% stenosis being reduced to 0%, with TIMI-3 flow, and no evidence for dissection   Tawni CarnesAndrew Vennesa Bastedo, MD 06/25/2013, 7:21 AM PGY-1, [facility] FPTS Intern pager: [phone], text pages welcome

## 2013-06-25 NOTE — Progress Notes (Signed)
FMTS Attending Note  I personally saw and evaluated the patient. The plan of care was discussed with the resident team. I agree with the assessment and plan as documented by the resident.   48 y/o female admitted with chest pain, found to have 85% stenosis distal LAD, she is s/p angioplasty with no stent placement, currently asymptomatic. Patient understands that she will be discharge on Brilinta and ASA. Cardiology anticipates discharge on 06/26/13. Patient counseled on tobacco cessation and she is agreeable to nicotine patch at time of discharge.  Donnella Sham MD

## 2013-06-25 NOTE — Progress Notes (Signed)
Patient Name: Lynn Harvey Date of Encounter: 06/25/2013     Principal Problem:   NSTEMI (non-ST elevated myocardial infarction) Active Problems:   Tobacco abuse   Depression   ADD (attention deficit disorder)   HTN (hypertension)   Hyperlipidemia    SUBJECTIVE No acute concerns overnight per nursing staff. Denies any chest pain, shortness of breath or dizziness. Does willing to quit tobacco.   CURRENT MEDS . aspirin EC  81 mg Oral Daily  . atorvastatin  40 mg Oral q1800  . buPROPion  300 mg Oral Daily  . metoprolol tartrate  12.5 mg Oral BID  . nicotine  21 mg Transdermal Daily  . ticagrelor  90 mg Oral BID    OBJECTIVE  Filed Vitals:   06/24/13 1929 06/24/13 2257 06/25/13 0100 06/25/13 0626  BP: 133/74 118/71  125/70  Pulse: 80 71  75  Temp: 97.8 F (36.6 C) 98.2 F (36.8 C)  98.3 F (36.8 C)  TempSrc: Oral Oral  Oral  Resp: 16 18  20   Height:      Weight:   135 lb 9.3 oz (61.5 kg)   SpO2: 100% 97%  98%    Intake/Output Summary (Last 24 hours) at 06/25/13 0709 Last data filed at 06/24/13 2301  Gross per 24 hour  Intake   1140 ml  Output   1300 ml  Net   -160 ml   Filed Weights   06/24/13 0146 06/24/13 0613 06/25/13 0100  Weight: 135 lb (61.236 kg) 137 lb (62.143 kg) 135 lb 9.3 oz (61.5 kg)    PHYSICAL EXAM  General: Pleasant, NAD. Neuro: Alert and oriented X 3. Moves all extremities spontaneously. Normal sensation/movement in distal extremity beyond cath site. Psych: Normal affect. HEENT:  Normal  Neck: Supple without bruits or JVD. Lungs:  Resp regular and unlabored, CTA. Heart: RRR no s3, s4, or murmurs. Abdomen: Soft, non-tender, non-distended, BS + x 4.  Extremities: No clubbing, cyanosis or edema. DP/PT/Radials 2+ and equal bilaterally. R radial cath site c/d/i.  Accessory Clinical Findings  CBC  Recent Labs  06/24/13 0215 06/24/13 1034 06/25/13 0354  WBC 9.7 6.7 8.0  NEUTROABS 6.6  --   --   HGB 14.0 13.3 13.0  HCT 40.4 39.3  38.9  MCV 91.2 91.2 92.0  PLT 286 273 270   Basic Metabolic Panel  Recent Labs  06/24/13 1034 06/25/13 0354  NA 138 141  K 4.6 3.9  CL 102 108  CO2 23 22  GLUCOSE 92 86  BUN 6 8  CREATININE 0.70 0.81  CALCIUM 9.0 8.2*   Cardiac Enzymes  Recent Labs  06/24/13 0730 06/24/13 1330  TROPONINI 0.54* 1.66*   Hemoglobin A1C  Recent Labs  06/24/13 0730  HGBA1C 5.7*   Fasting Lipid Panel  Recent Labs  06/24/13 0730  CHOL 168  HDL 39*  LDLCALC 108*  TRIG 103  CHOLHDL 4.3   Thyroid Function Tests  Recent Labs  06/24/13 0730  TSH 1.760    TELE NSR without significant ventricular ectopy  ECG 06/25/2013 NSR with T wave inversion in anterior lead. QTc 500   Radiology/Studies  Dg Chest 2 View  06/24/2013   CLINICAL DATA:  Chest pain.  EXAM: CHEST  2 VIEW  COMPARISON:  DG CHEST 2V dated 04/12/2011  FINDINGS: Mediastinum a.m. hilar structures normal. Lungs are clear. No focal infiltrate. No pleural effusion or pneumothorax. Heart size normal.  IMPRESSION: No active cardiopulmonary disease.   Electronically Signed  ByMaisie Fus: Thomas  Register   On: 06/24/2013 12:47    ASSESSMENT AND PLAN 1. NSTEMI/CAD:  No chest pain or sob overnight.  Ambulating w/o difficulty.  S/P cath 06/24/13 85% prox LAD treated with cutting balloon angiosculpt. Anterior hypokinesis with EF 50%.  Cardiac rehab to see this AM.  Cont asa, statin, bb, brilinta.  Plan d/c tomorrow if she does well today.  2. Hyperlipidemia:  LDL 108.  Cont statin.  F/U LFT's.  3. HTN:  Stable.  Cont bb.  4. Tobacco abuse:  Discussed importance of tobacco cessation.  Signed, Ok Anishristopher R Berge NP   Patient seen and examined. Agree with assessment and plan. Day 1 s/p NSTEMI due to high grade LAD stenosis successfully treated with Angiosculpt scoring cutting balloon.  Trop increased to 1.66. ECG with more T wave abnormality. No recurrent chest pain. No further nausea. R radial cath site stable. Will add low dose ACE-I to  BB and statin. Discussed smoking cessation. Plan probable DC tomorrow.   Lennette Biharihomas A. Kelly, MD, [facility] 06/25/2013 8:33 AM

## 2013-06-26 ENCOUNTER — Telehealth: Payer: Self-pay

## 2013-06-26 DIAGNOSIS — E785 Hyperlipidemia, unspecified: Secondary | ICD-10-CM

## 2013-06-26 DIAGNOSIS — Z9861 Coronary angioplasty status: Secondary | ICD-10-CM

## 2013-06-26 DIAGNOSIS — J309 Allergic rhinitis, unspecified: Secondary | ICD-10-CM

## 2013-06-26 LAB — COMPREHENSIVE METABOLIC PANEL
ALT: 15 U/L (ref 0–35)
AST: 17 U/L (ref 0–37)
Albumin: 3 g/dL — ABNORMAL LOW (ref 3.5–5.2)
Alkaline Phosphatase: 54 U/L (ref 39–117)
BUN: 11 mg/dL (ref 6–23)
CALCIUM: 8.7 mg/dL (ref 8.4–10.5)
CO2: 21 meq/L (ref 19–32)
Chloride: 102 mEq/L (ref 96–112)
Creatinine, Ser: 0.75 mg/dL (ref 0.50–1.10)
GFR calc Af Amer: >90 mL/min (ref >90)
GFR calc non Af Amer: >90 mL/min (ref >90)
Glucose, Bld: 103 mg/dL — ABNORMAL HIGH (ref 70–99)
Potassium: 4 mEq/L (ref 3.7–5.3)
SODIUM: 138 meq/L (ref 137–147)
TOTAL PROTEIN: 6.3 g/dL (ref 6.0–8.3)
Total Bilirubin: <0.2 mg/dL — ABNORMAL LOW (ref 0.3–1.2)

## 2013-06-26 LAB — CBC
HCT: 37.9 % (ref 36.0–46.0)
Hemoglobin: 12.6 g/dL (ref 12.0–15.0)
MCH: 30.6 pg (ref 26.0–34.0)
MCHC: 33.2 g/dL (ref 30.0–36.0)
MCV: 92 fL (ref 78.0–100.0)
PLATELETS: 242 10*3/uL (ref 150–400)
RBC: 4.12 MIL/uL (ref 3.87–5.11)
RDW: 13.5 % (ref 11.5–15.5)
WBC: 6.9 10*3/uL (ref 4.0–10.5)

## 2013-06-26 MED ORDER — LISINOPRIL 2.5 MG PO TABS: 2.5000 mg | ORAL_TABLET | Freq: Every day | ORAL | Status: DC

## 2013-06-26 MED ORDER — TICAGRELOR 90 MG PO TABS: 90.0000 mg | ORAL_TABLET | Freq: Two times a day (BID) | ORAL | Status: DC

## 2013-06-26 MED ORDER — ATORVASTATIN CALCIUM 40 MG PO TABS: 40.0000 mg | ORAL_TABLET | Freq: Every day | ORAL | Status: DC

## 2013-06-26 MED ORDER — NITROGLYCERIN 0.4 MG SL SUBL: 0.4000 mg | SUBLINGUAL_TABLET | SUBLINGUAL | Status: DC | PRN

## 2013-06-26 MED ORDER — METOPROLOL TARTRATE 12.5 MG HALF TABLET: 12.5000 mg | ORAL_TABLET | Freq: Two times a day (BID) | ORAL | Status: DC

## 2013-06-26 MED ORDER — ASPIRIN 81 MG PO TBEC: 81.0000 mg | DELAYED_RELEASE_TABLET | Freq: Every day | ORAL | Status: DC

## 2013-06-26 NOTE — Progress Notes (Signed)
CARE MANAGEMENT NOTE 06/26/2013  Patient:  Lynn Harvey, Lynn Harvey   Account Number:  [id number]  Date Initiated:  06/26/2013  Documentation initiated by:  GRAVES-BIGELOW,Mohammedali Bedoy  Subjective/Objective Assessment:   Pt admitted for CP- Nstemi. Plan for home on Brilinta. Pt has 30 day free card. Will need 30 day free Rx card and she has co pay card.     Action/Plan:   Pt gets meds from [facility] and medication is available.   Anticipated DC Date:  06/26/2013   Anticipated DC Plan:  HOME/SELF CARE      DC Planning Services  CM consult      Choice offered to / List presented to:             Status of service:  Completed, signed off Medicare Important Message given?   (If response is "NO", the following Medicare IM given date fields will be blank) Date Medicare IM given:   Date Additional Medicare IM given:    Discharge Disposition:  HOME/SELF CARE  Per UR Regulation:  Reviewed for med. necessity/level of care/duration of stay  If discussed at Long Length of Stay Meetings, dates discussed:    Comments:

## 2013-06-26 NOTE — Telephone Encounter (Signed)
Lynn Harvey must refill these medications due to them being controlled substances.  I am concerned about her Adderall use and recent MI.  I think Lynn Harvey should definitely make this decision but she might want the cardiologist to clear her for use of Adderall.

## 2013-06-26 NOTE — Discharge Summary (Signed)
FMTS Attending Note  I personally saw and evaluated the patient. The plan of care was discussed with the resident team. I agree with the assessment and plan as documented by the resident.   Pamila Mendibles MD 

## 2013-06-26 NOTE — Telephone Encounter (Signed)
Adderal, xanax  Needed.  Patient can no longer smoke due to  A Heart attack on Tuesday, being discharged today from [facility] hospital.   She has an appointment with [name] next Thursday.   [facility] - [place], [place] - [address]   [phone]

## 2013-06-26 NOTE — Telephone Encounter (Signed)
I called pt to recommend that she speak to cardiologist concerning safety of taking either adderall or ritalin after MI. Pt stated that her cardiologist had advised that it is [facility] for her to remain on adderall. Pt stated that if she has to wait until [name] gets back next Thurs to get RFs she will probably just find another provider. Suggested to pt that she call her cardiologist and have him write a letter of clearance for her to take stimulant and I will get it to a provider to review. Pt agreed to call him on Monday. Sarah and Russell, [address]

## 2013-06-26 NOTE — Progress Notes (Signed)
Patient Name: Lynn Harvey Date of Encounter: 06/26/2013     Principal Problem:   NSTEMI (non-ST elevated myocardial infarction) Active Problems:   Depression   ADD (attention deficit disorder)   Tobacco abuse   HTN (hypertension)   Hyperlipidemia    SUBJECTIVE No acute concerns overnight per nursing staff. Denies any chest pain, shortness of breath or dizziness. Does willing to quit tobacco.   CURRENT MEDS . ALPRAZolam  0.25 mg Oral q morning - 10a  . aspirin EC  81 mg Oral Daily  . atorvastatin  40 mg Oral q1800  . buPROPion  300 mg Oral Daily  . lisinopril  2.5 mg Oral Daily  . metoprolol tartrate  12.5 mg Oral BID  . nicotine  21 mg Transdermal Daily  . ticagrelor  90 mg Oral BID    OBJECTIVE  Filed Vitals:   06/25/13 0810 06/25/13 1430 06/25/13 1922 06/26/13 0643  BP: 149/70 124/71 142/71 133/76  Pulse: 77  69 73  Temp: 98.1 F (36.7 C)  98 F (36.7 C) 97.9 F (36.6 C)  TempSrc: Oral  Oral Oral  Resp: 20  18 18   Height:   5\' 3"  (1.6 m)   Weight:   136 lb 9.6 oz (61.961 kg) 134 lb 14.4 oz (61.19 kg)  SpO2: 98%  99% 99%   No intake or output data in the 24 hours ending 06/26/13 0846 Filed Weights   06/25/13 0100 06/25/13 1922 06/26/13 0643  Weight: 135 lb 9.3 oz (61.5 kg) 136 lb 9.6 oz (61.961 kg) 134 lb 14.4 oz (61.19 kg)    PHYSICAL EXAM  General: Pleasant, NAD. Neuro: Alert and oriented X 3. Moves all extremities spontaneously. Normal sensation/movement in distal extremity beyond cath site. Psych: Normal affect. HEENT:  Normal  Neck: Supple without bruits or JVD. Lungs:  Resp regular and unlabored, CTA. Heart: RRR no s3, s4, or murmurs. Abdomen: Soft, non-tender, non-distended, BS + x 4.  Extremities: No clubbing, cyanosis or edema. DP/PT/Radials 2+ and equal bilaterally. R radial cath site c/d/i.  Accessory Clinical Findings  CBC  Recent Labs  06/24/13 0215  06/25/13 0354 06/26/13 0550  WBC 9.7  < > 8.0 6.9  NEUTROABS 6.6  --   --    --   HGB 14.0  < > 13.0 12.6  HCT 40.4  < > 38.9 37.9  MCV 91.2  < > 92.0 92.0  PLT 286  < > 270 242  < > = values in this interval not displayed. Basic Metabolic Panel  Recent Labs  06/25/13 0354 06/26/13 0550  NA 141 138  K 3.9 4.0  CL 108 102  CO2 22 21  GLUCOSE 86 103*  BUN 8 11  CREATININE 0.81 0.75  CALCIUM 8.2* 8.7   Cardiac Enzymes  Recent Labs  06/24/13 0730 06/24/13 1330  TROPONINI 0.54* 1.66*   Hemoglobin A1C  Recent Labs  06/24/13 0730  HGBA1C 5.7*   Fasting Lipid Panel  Recent Labs  06/24/13 0730  CHOL 168  HDL 39*  LDLCALC 108*  TRIG 103  CHOLHDL 4.3   Thyroid Function Tests  Recent Labs  06/24/13 0730  TSH 1.760    TELE NSR without significant ventricular ectopy  ECG 06/25/2013 NSR with T wave inversion in anterior lead. QTc 500   Radiology/Studies  Dg Chest 2 View  06/24/2013   CLINICAL DATA:  Chest pain.  EXAM: CHEST  2 VIEW  COMPARISON:  DG CHEST 2V dated 04/12/2011  FINDINGS: Mediastinum  a.m. hilar structures normal. Lungs are clear. No focal infiltrate. No pleural effusion or pneumothorax. Heart size normal.  IMPRESSION: No active cardiopulmonary disease.   Electronically Signed   By: Maisie Fushomas  Register   On: 06/24/2013 12:47    ASSESSMENT AND PLAN 1. NSTEMI/CAD:  No chest pain or sob overnight.  Ambulating w/o difficulty.  S/P cath 06/24/13 85% prox LAD treated with cutting balloon angiosculpt. Anterior hypokinesis with EF 50%.  Cardiac rehab to see this AM.  Cont asa, statin, bb, brilinta.  Plan d/c tomorrow if she does well today.  2. Hyperlipidemia:  LDL 108.  Cont statin.  F/U LFT's.  3. HTN:  Stable.  Cont bb.  4. Tobacco abuse:  Discussed importance of tobacco cessation.  She would like to follow up with Dr. Daphene Jaegerom Kelly. She should see him in several weeks.   Vesta MixerPhilip J. Nahser, Montez HagemanJr., MD, [facility] 06/26/2013, 9:05 AM Office - [phone] Pager [phone]

## 2013-06-26 NOTE — Discharge Summary (Signed)
[facility] Discharge Summary  Patient name: Lynn HamburgerChantel N Loa Medical record number: [id number] Date of birth: 10/11/1965 Age: 48 y.o. Gender: female Date of Admission: 06/24/2013  Date of Discharge: 06/26/2013 Admitting Physician: Janit PaganKehinde Eniola, MD  Primary Care Provider: JEFFERY,[name], PA-C Consultants: Cardiology  Indication for Hospitalization: Chest pain  Discharge Diagnoses/Problem List:  NSTEMI s/p balloon angioplasty Hyperlipidemia Depression Tobacco abuse ADHD Prediabetes  Disposition: home   Discharge Condition: improved  Discharge Exam:  General: NAD, standing up and walking from bathroom  Cardiovascular: RRR, normal s1/s2, no murmurs. 2+ PT pulses b/l  Respiratory: CTAB, normal effort   Extremities: no edema or cyanosis. WWP.  Skin: bandage right wrist clean/dry/intact from cath insertion  Neuro: alert and oriented,   Brief Hospital Course:  Lynn Harvey is a 48 y.o. female was admitted for sudden onset chest pain. PMH significant for HLD and tobacco abuse. Her chest pain had started to improved before arrival at the ED and initial workup was all negative including labs, EKG, CXR, and i-stat troponin. She was given nitro and GI cocktail with suspected GERD as the cause of her pain. Her second i-stat troponin increased to 0.12, and serum troponin 0.57. Cardiology was consulted and decided to bring her to cardiac cath where a 85% stenosis was found in the distal LAD which was successfully revascularized with balloon angioplasty. She recovered well after the procedure, was started on dual antiplatelet therapy with ASA and ticagrelor in addition to lipitor and metoprolol. She was started on lisinopril and monitored for one more day before being discharged.  She tolerated the lisinopril and will be discharged today. She will continue metoprolol, lisinopril, statin, and brilinta upon discharge.   Issues for Follow Up:  1. Cardiology follow  up 2. Tobacco cessation: while in hospital patient did not express interest in quitting smoking 3. Prediabetes: will likely need closer monitoring given this cardiac event  Significant Procedures: Cardiac catheterization  Significant Labs and Imaging:   Recent Labs Lab 06/24/13 1034 06/25/13 0354 06/26/13 0550  WBC 6.7 8.0 6.9  HGB 13.3 13.0 12.6  HCT 39.3 38.9 37.9  PLT 273 270 242    Recent Labs Lab 06/24/13 0215 06/24/13 1034 06/25/13 0354 06/26/13 0550  NA 138 138 141 138  K 4.0 4.6 3.9 4.0  CL 102 102 108 102  CO2 23 23 22 21   GLUCOSE 103* 92 86 103*  BUN 5* 6 8 11   CREATININE 0.66 0.70 0.81 0.75  CALCIUM 9.2 9.0 8.2* 8.7  ALKPHOS  --   --   --  54  AST  --   --   --  17  ALT  --   --   --  15  ALBUMIN  --   --   --  3.0*   Lipid Panel     Component Value Date/Time   CHOL 168 06/24/2013 0730   TRIG 103 06/24/2013 0730   HDL 39* 06/24/2013 0730   CHOLHDL 4.3 06/24/2013 0730   VLDL 21 06/24/2013 0730   LDLCALC 108* 06/24/2013 0730   Cardiac catheterization: LOCATION: FACILITY: [facility]  PHYSICIAN: Lennette Biharihomas A. Kelly, MD, [facility]  02/23/1965  DATE OF PROCEDURE: 06/24/2013   HEMODYNAMICS:  Central Aorta: 120/66  Left Ventricle: 120/11  ANGIOGRAPHY:  The left main coronary artery was angiographically normal vesse which was short and immediately bifurcated into the LAD and left circumflex coronary artery.  The LAD was a moderate size vessel with proximal calcification. There was a 30% stenosis  proximally prior to the takeoff of the septal perforating artery and just after the septal takeoff was a diagonal vessel. There was an 85% stenosis in the LAD immediately distal and extending up to the diagonal takeoff. The remainder of the LAD was free of significant disease.  The left circumflex coronary artery was anatomically normal and gave rise to two major bifurcating obtuse marginal branch.  The RCA was angiographically normal it gave rise to a large PDA and PLA vessel.  Left  ventriculography revealed low normal LV function with moderate anterolateral hypocontractility . Ejection fraction is 50%  Following successful percutaneous coronary intervention to the LAD with angioscoped cutting balloon. The 85% stenosis was reduced to 0%. There was no evidence for dissection. There is no evidence for plaque shifting into the diagonal vessel.  IMPRESSION:  Acute coronary syndrome resulting in low normal acute LV dysfunction with mild to moderate anterolateral hypocontractility and ejection fraction of 50%.  Single-vessel coronary artery disease with proximal LAD calcification, proximal 30% narrowing before the septal perforator and diagonal vessel with focal 85% stenosis just distal and up to the diagonal vessel.  Normal left circumflex and right coronary arteries.  Successful percutaneous coronary intervention with cutting balloon Angiosculpt to the LAD with the 85% stenosis being reduced to 0%, with TIMI-3 flow, and no evidence for dissection  Results/Tests Pending at Time of Discharge: none  Discharge Medications:    Medication List         ALPRAZolam 0.5 MG tablet  Commonly known as:  XANAX  Take 1 tablet (0.5 mg total) by mouth 2 (two) times daily as needed for sleep or anxiety.     amphetamine-dextroamphetamine 15 MG tablet  Commonly known as:  ADDERALL  Take 1 tablet (15 mg total) by mouth 2 (two) times daily.     aspirin 81 MG EC tablet  Take 1 tablet (81 mg total) by mouth daily.     atorvastatin 40 MG tablet  Commonly known as:  LIPITOR  Take 1 tablet (40 mg total) by mouth daily at 6 PM.     buPROPion 150 MG 24 hr tablet  Commonly known as:  WELLBUTRIN XL  Take 2 tablets (300 mg total) by mouth every morning.     fluticasone 50 MCG/ACT nasal spray  Commonly known as:  FLONASE  Place 2 sprays into the nose daily.     lisinopril 2.5 MG tablet  Commonly known as:  PRINIVIL,ZESTRIL  Take 1 tablet (2.5 mg total) by mouth daily.     metoprolol  tartrate 12.5 mg Tabs tablet  Commonly known as:  LOPRESSOR  Take 0.5 tablets (12.5 mg total) by mouth 2 (two) times daily.     nitroGLYCERIN 0.4 MG SL tablet  Commonly known as:  NITROSTAT  Place 1 tablet (0.4 mg total) under the tongue every 5 (five) minutes as needed for chest pain.     ticagrelor 90 MG Tabs tablet  Commonly known as:  BRILINTA  Take 1 tablet (90 mg total) by mouth 2 (two) times daily.        Discharge Instructions: Please refer to Patient Instructions section of EMR for full details.  Patient was counseled important signs and symptoms that should prompt return to medical care, changes in medications, dietary instructions, activity restrictions, and follow up appointments.   Follow-Up Appointments: Follow-up Information   Follow up with JEFFERY,[name], PA-C In 2 weeks. (For hospital follow up)    Specialty:  Physician Assistant   Contact information:  [address] [place] [address] [phone]       Myra Rude, MD 06/26/2013, 12:20 PM PGY-1, [facility]

## 2013-06-26 NOTE — Telephone Encounter (Signed)
Patient came in to 102 office to pick up her xanax and adderall prescriptions. Did not see any prescriptions ready in pick-up drawer. Advised patient that Britta Mccreedy was at lunch and told her that I would put in a message. Patient stated that she has enough xanax to last her until Monday but not enough adderall. States she has ritalin that she can take until she hears something. Please return call and advise. Thank you!

## 2013-06-26 NOTE — Discharge Instructions (Signed)
You were admitted for chest pain and subsequently found to have a minor heart attack. You were evaluated by the cardiologists and in your cardiac catheterization you had an 85% blockage in one of your major heart arteries, which was successfully cleared.   The single best thing you can do for your health going forward is to quit smoking. Please talk to your primary doctor about getting assistance with this when you are ready.   Acute Coronary Syndrome Acute coronary syndrome (ACS) is an urgent problem in which the blood and oxygen supply to the heart is critically deficient. ACS requires hospitalization because one or more coronary arteries may be blocked. ACS represents a range of conditions including:  Previous angina that is now unstable, lasts longer, happens at rest, or is more intense.  A heart attack, with heart muscle cell injury and death. There are three vital coronary arteries that supply the heart muscle with blood and oxygen so that it can pump blood effectively. If blockages to these arteries develop, blood flow to the heart muscle is reduced. If the heart does not get enough blood, angina may occur as the first warning sign. SYMPTOMS   The most common signs of angina include:  Tightness or squeezing in the chest.  Feeling of heaviness on the chest.  Discomfort in the arms, neck, or jaw.  Shortness of breath and nausea.  Cold, wet skin.  Angina is usually brought on by physical effort or excitement which increase the oxygen needs of the heart. These states increase the blood flow needs of the heart beyond what can be delivered. TREATMENT   Medicines to help discomfort may include nitroglycerin (nitro) in the form of tablets or a spray for rapid relief, or longer-acting forms such as cream, patches, or capsules. (Be aware that there are many side effects and possible interactions with other drugs).  Other medicines may be used to help the heart pump better.  Procedures  to open blocked arteries including angioplasty or stent placement to keep the arteries open.  Open heart surgery may be needed when there are many blockages or they are in critical locations that are best treated with surgery. HOME CARE INSTRUCTIONS   Avoid smoking.  Take one baby or adult aspirin daily, if your caregiver advises. This helps reduce the risk of a heart attack.  It is very important that you follow the angina treatment prescribed by your caregiver. Make arrangements for proper follow-up care.  Eat a heart healthy diet with salt and fat restrictions as advised.  Regular exercise is good for you as long as it does not cause discomfort. Do not begin any new type of exercise until you check with your caregiver.  If you are overweight, you should lose weight.  Try to maintain normal blood lipid levels.  Keep your blood pressure under control as recommended by your caregiver.  You should tell your caregiver right away about any increase in the severity or frequency of your chest discomfort or angina attacks. When you have angina, you should stop what you are doing and sit down. This may bring relief in 3 to 5 minutes. If your caregiver has prescribed nitro, take it as directed.  If your caregiver has given you a follow-up appointment, it is very important to keep that appointment. Not keeping the appointment could result in a chronic or permanent injury, pain, and disability. If there is any problem keeping the appointment, you must call back to this facility for assistance. SEEK  IMMEDIATE MEDICAL CARE IF:   You develop nausea, vomiting, or shortness of breath.  You feel faint, lightheaded, or pass out.  Your chest discomfort gets worse.  You are sweating or experience sudden profound fatigue.  You do not get relief of your chest pain after 3 doses of nitro.  Your discomfort lasts longer than 15 minutes. MAKE SURE YOU:   Understand these instructions.  Will watch  your condition.  Will get help right away if you are not doing well or get worse. Document Released: 02/05/2005 Document Revised: 04/30/2011 Document Reviewed: 09/09/2007 [facility] Patient Information 2014 [place], [place].   Radial Site Care Refer to this sheet in the next few weeks. These instructions provide you with information on caring for yourself after your procedure. Your caregiver may also give you more specific instructions. Your treatment has been planned according to current medical practices, but problems sometimes occur. Call your caregiver if you have any problems or questions after your procedure. HOME CARE INSTRUCTIONS  You may shower the day after the procedure.Remove the bandage (dressing) and gently wash the site with plain soap and water.Gently pat the site dry.  Do not apply powder or lotion to the site.  Do not submerge the affected site in water for 3 to 5 days.  Inspect the site at least twice daily.  Do not flex or bend the affected arm for 24 hours.  No lifting over 5 pounds (2.3 kg) for 5 days after your procedure.  Do not drive home if you are discharged the same day of the procedure. Have someone else drive you.  You may drive 24 hours after the procedure unless otherwise instructed by your caregiver.  Do not operate machinery or power tools for 24 hours.  A responsible adult should be with you for the first 24 hours after you arrive home. What to expect:  Any bruising will usually fade within 1 to 2 weeks.  Blood that collects in the tissue (hematoma) may be painful to the touch. It should usually decrease in size and tenderness within 1 to 2 weeks. SEEK IMMEDIATE MEDICAL CARE IF:  You have unusual pain at the radial site.  You have redness, warmth, swelling, or pain at the radial site.  You have drainage (other than a small amount of blood on the dressing).  You have chills.  You have a fever or persistent symptoms for more than 72  hours.  You have a fever and your symptoms suddenly get worse.  Your arm becomes pale, cool, tingly, or numb.  You have heavy bleeding from the site. Hold pressure on the site. Document Released: 03/10/2010 Document Revised: 04/30/2011 Document Reviewed: 03/10/2010 [facility] Patient Information 2014 [place], [place].  Cardiac Diet This diet can help prevent heart disease and stroke. Many factors influence your heart health, including eating and exercise habits. Coronary risk rises a lot with abnormal blood fat (lipid) levels. Cardiac meal planning includes limiting unhealthy fats, increasing healthy fats, and making other small dietary changes. General guidelines are as follows:  Adjust calorie intake to reach and maintain desirable body weight.  Limit total fat intake to less than 30% of total calories. Saturated fat should be less than 7% of calories.  Saturated fats are found in animal products and in some vegetable products. Saturated vegetable fats are found in coconut oil, cocoa butter, palm oil, and palm kernel oil. Read labels carefully to avoid these products as much as possible. Use butter in moderation. Choose tub margarines and oils  that have 2 grams of fat or less. Good cooking oils are canola and olive oils.  Practice low-fat cooking techniques. Do not fry food. Instead, broil, bake, boil, steam, grill, roast on a rack, stir-fry, or microwave it. Other fat reducing suggestions include:  Remove the skin from poultry.  Remove all visible fat from meats.  Skim the fat off stews, soups, and gravies before serving them.  Steam vegetables in water or broth instead of sauting them in fat.  Avoid foods with trans fat (or hydrogenated oils), such as commercially fried foods and commercially baked goods. Commercial shortening and deep-frying fats will contain trans fat.  Increase intake of fruits, vegetables, whole grains, and legumes to replace foods high in fat.  Increase  consumption of nuts, legumes, and seeds to at least 4 servings weekly. One serving of a legume equals  cup, and 1 serving of nuts or seeds equals  cup.  Choose whole grains more often. Have 3 servings per day (a serving is 1 ounce [oz]).  Eat 4 to 5 servings of vegetables per day. A serving of vegetables is 1 cup of raw leafy vegetables;  cup of raw or cooked cut-up vegetables;  cup of vegetable juice.  Eat 4 to 5 servings of fruit per day. A serving of fruit is 1 medium whole fruit;  cup of dried fruit;  cup of fresh, frozen, or canned fruit;  cup of 100% fruit juice.  Increase your intake of dietary fiber to 20 to 30 grams per day. Insoluble fiber may help lower your risk of heart disease and may help curb your appetite.  Soluble fiber binds cholesterol to be removed from the blood. Foods high in soluble fiber are dried beans, citrus fruits, oats, apples, bananas, broccoli, Brussels sprouts, and eggplant.  Try to include foods fortified with plant sterols or stanols, such as yogurt, breads, juices, or margarines. Choose several fortified foods to achieve a daily intake of 2 to 3 grams of plant sterols or stanols.  Foods with omega-3 fats can help reduce your risk of heart disease. Aim to have a 3.5 oz portion of fatty fish twice per week, such as salmon, mackerel, albacore tuna, sardines, lake trout, or herring. If you wish to take a fish oil supplement, choose one that contains 1 gram of both DHA and EPA.  Limit processed meats to 2 servings (3 oz portion) weekly.  Limit the sodium in your diet to 1500 milligrams (mg) per day. If you have high blood pressure, talk to a registered dietitian about a DASH (Dietary Approaches to Stop Hypertension) eating plan.  Limit sweets and beverages with added sugar, such as soda, to no more than 5 servings per week. One serving is:   1 tablespoon sugar.  1 tablespoon jelly or jam.   cup sorbet.  1 cup lemonade.   cup regular  soda. CHOOSING FOODS Starches  Allowed: Breads: All kinds (wheat, rye, raisin, white, oatmeal, [place], [place], and English muffin bread). Low-fat rolls: English muffins, frankfurter and hamburger buns, bagels, pita bread, tortillas (not fried). Pancakes, waffles, biscuits, and muffins made with recommended oil.  Avoid: Products made with saturated or trans fats, oils, or whole milk products. Butter rolls, cheese breads, croissants. Commercial doughnuts, muffins, sweet rolls, biscuits, waffles, pancakes, store-bought mixes. Crackers  Allowed: Low-fat crackers and snacks: Animal, graham, rye, saltine (with recommended oil, no lard), oyster, and matzo crackers. Bread sticks, melba toast, rusks, flatbread, pretzels, and light popcorn.  Avoid: High-fat crackers: cheese crackers, butter  crackers, and those made with coconut, palm oil, or trans fat (hydrogenated oils). Buttered popcorn. Cereals  Allowed: Hot or cold whole-grain cereals.  Avoid: Cereals containing coconut, hydrogenated vegetable fat, or animal fat. Potatoes / Pasta / Rice  Allowed: All kinds of potatoes, rice, and pasta (such as macaroni, spaghetti, and noodles).  Avoid: Pasta or rice prepared with cream sauce or high-fat cheese. Chow mein noodles, [place] fries. Vegetables  Allowed: All vegetables and vegetable juices.  Avoid: Fried vegetables. Vegetables in cream, butter, or high-fat cheese sauces. Limit coconut. Fruit in cream or custard. Protein  Allowed: Limit your intake of meat, seafood, and poultry to no more than 6 oz (cooked weight) per day. All lean, well-trimmed beef, veal, pork, and lamb. All chicken and [place] without skin. All fish and shellfish. Wild game: wild duck, rabbit, pheasant, and venison. Egg whites or low-cholesterol egg substitutes may be used as desired. Meatless dishes: recipes with dried beans, peas, lentils, and tofu (soybean curd). Seeds and nuts: all seeds and most nuts.  Avoid: Prime grade and  other heavily marbled and fatty meats, such as short ribs, spare ribs, rib eye roast or steak, frankfurters, sausage, bacon, and high-fat luncheon meats, mutton. Caviar. Commercially fried fish. Domestic duck, goose, venison sausage. Organ meats: liver, gizzard, heart, chitterlings, brains, kidney, sweetbreads. Dairy  Allowed: Low-fat cheeses: nonfat or low-fat cottage cheese (1% or 2% fat), cheeses made with part skim milk, such as mozzarella, farmers, string, or ricotta. (Cheeses should be labeled no more than 2 to 6 grams fat per oz.). Skim (or 1%) milk: liquid, powdered, or evaporated. Buttermilk made with low-fat milk. Drinks made with skim or low-fat milk or cocoa. Chocolate milk or cocoa made with skim or low-fat (1%) milk. Nonfat or low-fat yogurt.  Avoid: Whole milk cheeses, including colby, cheddar, [place], [address], [place], [place], [place], [address], [place], and blue. Creamed cottage cheese, cream cheese. Whole milk and whole milk products, including buttermilk or yogurt made from whole milk, drinks made from whole milk. Condensed milk, evaporated whole milk, and 2% milk. Soups and Combination Foods  Allowed: Low-fat low-sodium soups: broth, dehydrated soups, homemade broth, soups with the fat removed, homemade cream soups made with skim or low-fat milk. Low-fat spaghetti, lasagna, chili, and Spanish rice if low-fat ingredients and low-fat cooking techniques are used.  Avoid: Cream soups made with whole milk, cream, or high-fat cheese. All other soups. Desserts and Sweets  Allowed: Sherbet, fruit ices, gelatins, meringues, and angel food cake. Homemade desserts with recommended fats, oils, and milk products. Jam, jelly, honey, marmalade, sugars, and syrups. Pure sugar candy, such as gum drops, hard candy, jelly beans, marshmallows, mints, and small amounts of dark chocolate.  Avoid: Commercially prepared cakes, pies, cookies, frosting, pudding, or mixes for these products. Desserts  containing whole milk products, chocolate, coconut, lard, palm oil, or palm kernel oil. Ice cream or ice cream drinks. Candy that contains chocolate, coconut, butter, hydrogenated fat, or unknown ingredients. Buttered syrups. Fats and Oils  Allowed: Vegetable oils: safflower, sunflower, corn, soybean, cottonseed, sesame, canola, olive, or peanut. Non-hydrogenated margarines. Salad dressing or mayonnaise: homemade or commercial, made with a recommended oil. Low or nonfat salad dressing or mayonnaise.  Limit added fats and oils to 6 to 8 tsp per day (includes fats used in cooking, baking, salads, and spreads on bread). Remember to count the "hidden fats" in foods.  Avoid: Solid fats and shortenings: butter, lard, salt pork, bacon drippings. Gravy containing meat fat, shortening, or suet. Cocoa butter,  coconut. Coconut oil, palm oil, palm kernel oil, or hydrogenated oils: these ingredients are often used in bakery products, nondairy creamers, whipped toppings, candy, and commercially fried foods. Read labels carefully. Salad dressings made of unknown oils, sour cream, or cheese, such as blue cheese and Roquefort. Cream, all kinds: half-and-half, light, heavy, or whipping. Sour cream or cream cheese (even if "light" or low-fat). Nondairy cream substitutes: coffee creamers and sour cream substitutes made with palm, palm kernel, hydrogenated oils, or coconut oil. Beverages  Allowed: Coffee (regular or decaffeinated), tea. Diet carbonated beverages, mineral water. Alcohol: Check with your caregiver. Moderation is recommended.  Avoid: Whole milk, regular sodas, and juice drinks with added sugar. Condiments  Allowed: All seasonings and condiments. Cocoa powder. "Cream" sauces made with recommended ingredients.  Avoid: Carob powder made with hydrogenated fats. SAMPLE MENU Breakfast   cup orange juice   cup oatmeal  1 slice toast  1 tsp margarine  1 cup skim milk Lunch  [place] sandwich with 2  oz [place], 2 slices bread  Lettuce and tomato slices  Fresh fruit  Carrot sticks  Coffee or tea Snack  Fresh fruit or low-fat crackers Dinner  3 oz lean ground beef  1 baked potato  1 tsp margarine   cup asparagus  Lettuce salad  1 tbs non-creamy dressing   cup peach slices  1 cup skim milk Document Released: 11/15/2007 Document Revised: 08/07/2011 Document Reviewed: 05/01/2011 ExitCare Patient Information 2014 [place], [place].

## 2013-06-27 NOTE — Telephone Encounter (Signed)
Dr. Tresa Endo did her catheretization.  Please contact his office and ask if he is OK with her resuming Adderall.  Perhaps one of my APP colleagues would kindly authorize the Xanax prescription on my behalf.  In addition, I am happy to prescribe Chantix to help her quit smoking.  Is she interested?

## 2013-06-29 ENCOUNTER — Telehealth: Payer: Self-pay | Admitting: Cardiovascular Disease

## 2013-06-29 MED ORDER — ALPRAZOLAM 0.5 MG PO TABS: 0.5000 mg | ORAL_TABLET | Freq: Two times a day (BID) | ORAL | Status: DC | PRN

## 2013-06-29 NOTE — Telephone Encounter (Signed)
New Prob   Pt is followed by Theora Gianotti PA-C for primary care. Requesting verification that pt is able to continue on Adderrell. Please call.

## 2013-06-29 NOTE — Telephone Encounter (Signed)
Rx for Xanax faxed to [facility] and called Dr Landry Dyke office to ask for his clearance to Rx Adderall for pt. His office put in message for him and they will CB w/his response.

## 2013-06-29 NOTE — Telephone Encounter (Signed)
Returned a call to urgent medical care. Placed on hold for multiple phone cycles. Had to hang up call. I will defer question to Dr. Tresa Endo @ patients appointment.

## 2013-06-29 NOTE — Telephone Encounter (Signed)
I have written for the Xanax and when we get clearance from Dr Tresa Endo I would be happy to write for her Adderall.

## 2013-06-29 NOTE — Telephone Encounter (Signed)
LMVM to CB. 

## 2013-06-30 NOTE — Telephone Encounter (Signed)
Patient has appointment with Dr. Tresa Endo on 5/13

## 2013-07-01 ENCOUNTER — Ambulatory Visit (INDEPENDENT_AMBULATORY_CARE_PROVIDER_SITE_OTHER): Payer: 59 | Admitting: Cardiovascular Disease

## 2013-07-01 ENCOUNTER — Encounter: Payer: Self-pay | Admitting: Cardiovascular Disease

## 2013-07-01 VITALS — BP 158/82 | HR 77 | Ht 63.0 in | Wt 134.2 lb

## 2013-07-01 DIAGNOSIS — F17200 Nicotine dependence, unspecified, uncomplicated: Secondary | ICD-10-CM

## 2013-07-01 DIAGNOSIS — Z720 Tobacco use: Secondary | ICD-10-CM

## 2013-07-01 DIAGNOSIS — I10 Essential (primary) hypertension: Secondary | ICD-10-CM

## 2013-07-01 DIAGNOSIS — F3289 Other specified depressive episodes: Secondary | ICD-10-CM

## 2013-07-01 DIAGNOSIS — E785 Hyperlipidemia, unspecified: Secondary | ICD-10-CM

## 2013-07-01 DIAGNOSIS — I2510 Atherosclerotic heart disease of native coronary artery without angina pectoris: Secondary | ICD-10-CM

## 2013-07-01 DIAGNOSIS — F988 Other specified behavioral and emotional disorders with onset usually occurring in childhood and adolescence: Secondary | ICD-10-CM

## 2013-07-01 DIAGNOSIS — F329 Major depressive disorder, single episode, unspecified: Secondary | ICD-10-CM

## 2013-07-01 DIAGNOSIS — F32A Depression, unspecified: Secondary | ICD-10-CM

## 2013-07-01 DIAGNOSIS — Z79899 Other long term (current) drug therapy: Secondary | ICD-10-CM

## 2013-07-01 DIAGNOSIS — I214 Non-ST elevation (NSTEMI) myocardial infarction: Secondary | ICD-10-CM

## 2013-07-01 MED ORDER — AMPHETAMINE-DEXTROAMPHETAMINE 5 MG PO TABS: 5.0000 mg | ORAL_TABLET | Freq: Every day | ORAL | Status: DC

## 2013-07-01 MED ORDER — LISINOPRIL 5 MG PO TABS
5.0000 mg | ORAL_TABLET | Freq: Every day | ORAL | Status: DC
Start: 2013-07-01 — End: 2013-07-01

## 2013-07-01 MED ORDER — METOPROLOL TARTRATE 25 MG PO TABS: 25.0000 mg | ORAL_TABLET | Freq: Two times a day (BID) | ORAL | Status: DC

## 2013-07-01 MED ORDER — LISINOPRIL 5 MG PO TABS: 5.0000 mg | ORAL_TABLET | Freq: Every day | ORAL | Status: DC

## 2013-07-01 NOTE — Telephone Encounter (Signed)
Per Dr Landry Dyke note he would like her dose reduced and then slowly titrated up.  She typically takes 15mg  so I have reduced her dose to 5mg  bid and given her a month.  I would suggest she try and see [name] in about 3 weeks and at that time titrate up the dose.

## 2013-07-01 NOTE — Progress Notes (Signed)
Patient ID: Lynn Harvey N Pfluger, female   DOB: 06/03/1965, 48 y.o.   MRN: [id number]     HPI: Lynn HamburgerChantel N Kassin is a 48 y.o. female who presents to the office today for a follow up cardiology evaluation following her recent NSTEMI.  Ms. Lenoria Harvey is a 48 year old female, who presented to: Hospital on 06/24/2013 with new onset substernal chest tightness.  She had a 30 year history of tobacco use and had been smoking recently after 2 packs per day.  He ruled in for non-ST segment elevation myocardial infarction.  Cardiac catheterization revealed a 30% stenosis in the proximal LAD prior to the takeoff of the septal perforating artery and diagonal vessel and just beyond this diagonal was 85% LAD stenosis extending up to the diagonal takeoff.  The left circumflex and RCA were normal.  Acute ejection fraction was 50% and there was evidence for moderate anterolateral hypocontractility.  She underwent successful cutting balloon into sculpt with excellent angiographic results.  Residual narrowing was 0%.  For this reason, the artery was not stented, so as not to jail.  The diagonal [place], proximal portion of the stent in the proximal LAD narrowing.  She was discharged day 2 post MI on a medical regimen consisting of low-dose ACE inhibitor with lisinopril 2.5 mg, very low-dose beta blocker therapy at 12.5 twice a day, in addition to aspirin, Proventil, Lipitor 40 mg.  She has a history of ADD and apparently her mother died 2 years ago and several days later her son committed suicide.  She also has been on Wellbutrin and takes Alderall.  Since hospital discharge, she denies any recurrent chest tightness.  She does note some mild shortness of breath.  She quit smoking the day of her heart attack.  Past Medical History  Diagnosis Date  . Hypertension   . Depression   . Insomnia   . Gestational diabetes   . Allergy   . Anxiety   . Rectal abnormality     Past Surgical History  Procedure Laterality Date  . Wrist surgery   1987    RIGHT; bone from forearm grafted to wrist  . Abdominal hysterectomy    . Tonsilectomy, adenoidectomy, bilateral myringotomy and tubes    . Colon surgery      Allergies  Allergen Reactions  . Morphine And Related Nausea And Vomiting    Current Outpatient Prescriptions  Medication Sig Dispense Refill  . ALPRAZolam (XANAX) 0.5 MG tablet Take 1 tablet (0.5 mg total) by mouth 2 (two) times daily as needed for sleep or anxiety.  60 tablet  0  . amphetamine-dextroamphetamine (ADDERALL) 15 MG tablet Take 1 tablet (15 mg total) by mouth 2 (two) times daily.  60 tablet  0  . aspirin EC 81 MG EC tablet Take 1 tablet (81 mg total) by mouth daily.  30 tablet  0  . atorvastatin (LIPITOR) 40 MG tablet Take 1 tablet (40 mg total) by mouth daily at 6 PM.  30 tablet  0  . buPROPion (WELLBUTRIN XL) 150 MG 24 hr tablet Take 2 tablets (300 mg total) by mouth every morning.  60 tablet  5  . fluticasone (FLONASE) 50 MCG/ACT nasal spray Place 2 sprays into the nose daily.  16 g  6  . lisinopril (PRINIVIL,ZESTRIL) 2.5 MG tablet Take 1 tablet (2.5 mg total) by mouth daily.  30 tablet  0  . metoprolol tartrate (LOPRESSOR) 12.5 mg TABS tablet Take 0.5 tablets (12.5 mg total) by mouth 2 (two) times  daily.  60 tablet  0  . nitroGLYCERIN (NITROSTAT) 0.4 MG SL tablet Place 1 tablet (0.4 mg total) under the tongue every 5 (five) minutes as needed for chest pain.  10 tablet  12  . ticagrelor (BRILINTA) 90 MG TABS tablet Take 1 tablet (90 mg total) by mouth 2 (two) times daily.  60 tablet  0   No current facility-administered medications for this visit.    History   Social History  . Marital Status: Married    Spouse Name: [name]    Number of Children: 3  . Years of Education: 12   Occupational History  . CUSTOMER SERVICE [employer]   Social History Main Topics  . Smoking status: Former Smoker -- 2.00 packs/day for 20 years    Types: Cigarettes    Start date: 12/25/1982  . Smokeless  tobacco: Never Used     Comment: quit 06/24/13  . Alcohol Use: 14.4 oz/week    24 Cans of beer per week     Comment: more since fall 2012  . Drug Use: No  . Sexual Activity: Yes    Partners: Male    Birth Control/ Protection: Surgical   Other Topics Concern  . Not on file   Social History Narrative   Lives with her husband, their son [name], and her daughter [name] and Amber's son [name].   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  She quit smoking on 06/24/2013  Family History  Problem Relation Age of Onset  . Cancer Mother   . COPD Mother     emphysema  . Heart disease Father 75    s/p 3V CABG  . Depression Father   . Mental illness Sister     depression  . Depression Sister   . Mental illness Son     ADHD, bipolar disorder  . Diabetes Maternal Grandmother   . Heart disease Maternal Grandmother   . Diabetes Maternal Grandfather   . Cancer Maternal Grandfather     lung cancer  . Diabetes Paternal Grandmother   . Kidney disease Paternal Grandmother   . Diabetes Paternal Grandfather   . Heart disease Paternal Grandfather     ROS General: Negative; No fevers, chills, or night sweats HEENT: Negative; No changes in vision or hearing, sinus congestion, difficulty swallowing Pulmonary: Positive for mild shortness of breath No cough, wheezing, , hemoptysis Cardiovascular: Se  HPI; No , recurrent chest pain, presyncope, syncope, palpatations GI: Negative; No nausea, vomiting, diarrhea, or abdominal pain GU: Negative; No dysuria, hematuria, or difficulty voiding Musculoskeletal: Negative; no myalgias, joint pain, or weakness Hematologic: Negative; no easy bruising, bleeding Endocrine: Negative; no heat/cold intolerance; no diabetes, Neuro: Negative; no changes in balance, headaches Skin: Negative; No rashes or skin lesions Psychiatric: Positive for depression, and positive for attention deficit disorder; No behavioral problems,  Sleep: Negative; No  snoring,  daytime sleepiness, hypersomnolence, bruxism, restless legs, hypnogognic hallucinations. Other comprehensive 14 point system review is negative    Physical Exam BP 158/82  Pulse 77  Ht 5\' 3"  (1.6 m)  Wt 134 lb 3.2 oz (60.873 kg)  BMI 23.78 kg/m2 General: Alert, oriented, no distress.  Skin: normal turgor, no rashes, warm and dry; multiple tattoos HEENT: Normocephalic, atraumatic. Pupils equal round and reactive to light; sclera anicteric; extraocular muscles intact, No lid lag; Nose without nasal septal hypertrophy; Mouth/Parynx benign; Mallinpatti scale 2 Neck: No JVD, no carotid bruits; normal carotid upstroke Lungs: clear to ausculatation and percussion bilaterally;  no wheezing or rales, normal inspiratory and expiratory effort Chest wall: without tenderness to palpitation Heart: PMI not displaced, RRR, s1 s2 normal, faint 1/6 systolic murmur, No diastolic murmur, no rubs, gallops, thrills, or heaves Abdomen: soft, nontender; no hepatosplenomehaly, BS+; abdominal aorta nontender and not dilated by palpation. Back: no CVA tenderness Pulses: 2+  Musculoskeletal: full range of motion, normal strength, no joint deformities Extremities: Pulses 2+, no clubbing cyanosis or edema, Homan's sign negative  Neurologic: grossly nonfocal; Cranial nerves grossly wnl Psychologic: Normal mood and affect    ECG (independently read by me): Normal sinus rhythm at 77 beats per minute.  Mild T-wave inversion V3 through V6.  QTc interval of 390 ms  LABS:  BMET    Component Value Date/Time   NA 138 06/26/2013 0550   K 4.0 06/26/2013 0550   CL 102 06/26/2013 0550   CO2 21 06/26/2013 0550   GLUCOSE 103* 06/26/2013 0550   BUN 11 06/26/2013 0550   CREATININE 0.75 06/26/2013 0550   CREATININE 0.72 10/28/2012 1109   CALCIUM 8.7 06/26/2013 0550   GFRNONAA >90 06/26/2013 0550   GFRAA >90 06/26/2013 0550     Hepatic Function Panel     Component Value Date/Time   PROT 6.3 06/26/2013 0550   ALBUMIN 3.0*  06/26/2013 0550   AST 17 06/26/2013 0550   ALT 15 06/26/2013 0550   ALKPHOS 54 06/26/2013 0550   BILITOT <0.2* 06/26/2013 0550     CBC    Component Value Date/Time   WBC 6.9 06/26/2013 0550   WBC 7.8 08/15/2012 1645   RBC 4.12 06/26/2013 0550   RBC 4.37 08/15/2012 1645   HGB 12.6 06/26/2013 0550   HGB 13.6 08/15/2012 1645   HCT 37.9 06/26/2013 0550   HCT 42.8 08/15/2012 1645   PLT 242 06/26/2013 0550   MCV 92.0 06/26/2013 0550   MCV 97.9* 08/15/2012 1645   MCH 30.6 06/26/2013 0550   MCH 31.1 08/15/2012 1645   MCHC 33.2 06/26/2013 0550   MCHC 31.8 08/15/2012 1645   RDW 13.5 06/26/2013 0550   LYMPHSABS 2.5 06/24/2013 0215   MONOABS 0.5 06/24/2013 0215   EOSABS 0.1 06/24/2013 0215   BASOSABS 0.0 06/24/2013 0215     BNP No results found for this basename: probnp    Lipid Panel     Component Value Date/Time   CHOL 168 06/24/2013 0730   TRIG 103 06/24/2013 0730   HDL 39* 06/24/2013 0730   CHOLHDL 4.3 06/24/2013 0730   VLDL 21 06/24/2013 0730   LDLCALC 108* 06/24/2013 0730     RADIOLOGY: Dg Chest 2 View  06/24/2013   CLINICAL DATA:  Chest pain.  EXAM: CHEST  2 VIEW  COMPARISON:  DG CHEST 2V dated 04/12/2011  FINDINGS: Mediastinum a.m. hilar structures normal. Lungs are clear. No focal infiltrate. No pleural effusion or pneumothorax. Heart size normal.  IMPRESSION: No active cardiopulmonary disease.   Electronically Signed   By: Maisie Fus  Register   On: 06/24/2013 12:47      ASSESSMENT AND PLAN: Ms. Alithia Zavaleta is a 48 year old female with a greater than 30 year history of tobacco use, who developed a non-ST segment elevation myocardial infarction secondary to subtotal LAD stenosis.  She underwent cardiac catheterization on May 6 and underwent successful percutaneous coronary intervention with cutting balloon angiosculpt with a greater than 85% stenosis being reduced to 0%.  Because of 30% LAD disease proximal to the diagonal and with the excellent results obtained with the cutting balloon, stenting was  not done so as not  to jail the diagonal 1 and a stent in the proximal LAD stenosis.  She did quit smoking.  I will titrate her lisinopril from 2.5 mg to 5 mg.  After one week, she will then slowly titrate her Lopressor from 12.5 twice a day to 25 mg twice a day.  She does take Alderall for attention deficit disorder.  I suggested that she can resume this but at a reduced dose initially and slowly trade herself back to the dose that she had been on previously.  Since I did not put a stent in her LAD, I recommended that she can discontinue her brilinta after 30 days.  In 4 weeks, I'm scheduling her for an echo Doppler study to reassess LV function and her shortness of breath, which he be ample time recovery of stunned myocardium.  Repeat laboratory check at that time.  I will see her in the office in 6 weeks for cardiology followup evaluation.     [name], MD, [facility]  07/01/2013 8:47 AM

## 2013-07-01 NOTE — Telephone Encounter (Signed)
LMOM for pt w/Sarah's instr's and advised Adderall Rx is ready here for p/up and xanax had already been faxed to pharmacy.

## 2013-07-01 NOTE — Patient Instructions (Signed)
Your physician has recommended you make the following change in your medication: increase the lisinopril to 5 mg daily. take (2) of the 2.5 mg tablets daily until gone then start new 5 mg prescription. After 1 week increase the lopressor to 25 mg twice daily.  Your physician has requested that you have an echocardiogram. Echocardiography is a painless test that uses sound waves to create images of your heart. It provides your doctor with information about the size and shape of your heart and how well your heart's chambers and valves are working. This procedure takes approximately one hour. There are no restrictions for this procedure. This will be done in 4 weeks.  Your physician recommends that you return for lab work fasting in 4 weeks.  Your physician recommends that you schedule a follow-up appointment in: 6 weeks.

## 2013-07-01 NOTE — Telephone Encounter (Signed)
Patient seen by Dr. Tresa Endo this morning. Below is a copy of the office visit note. Please advise on Adderall prescription.    ASSESSMENT AND PLAN:  Lynn Harvey is a 48 year old female with a greater than 30 year history of tobacco use, who developed a non-ST segment elevation myocardial infarction secondary to subtotal LAD stenosis. She underwent cardiac catheterization on May 6 and underwent successful percutaneous coronary intervention with cutting balloon angiosculpt with a greater than 85% stenosis being reduced to 0%. Because of 30% LAD disease proximal to the diagonal and with the excellent results obtained with the cutting balloon, stenting was not done so as not to jail the diagonal 1 and a stent in the proximal LAD stenosis. She did quit smoking. I will titrate her lisinopril from 2.5 mg to 5 mg. After one week, she will then slowly titrate her Lopressor from 12.5 twice a day to 25 mg twice a day. She does take Alderall for attention deficit disorder. I suggested that she can resume this but at a reduced dose initially and slowly trade herself back to the dose that she had been on previously. Since I did not put a stent in her LAD, I recommended that she can discontinue her brilinta after 30 days. In 4 weeks, I'm scheduling her for an echo Doppler study to reassess LV function and her shortness of breath, which he be ample time recovery of stunned myocardium. Repeat laboratory check at that time. I will see her in the office in 6 weeks for cardiology followup evaluation.  [name], MD, [facility]  07/01/2013  8:47 AM

## 2013-07-03 ENCOUNTER — Encounter: Payer: Self-pay | Admitting: Physician Assistant

## 2013-07-03 ENCOUNTER — Telehealth: Payer: Self-pay | Admitting: Cardiovascular Disease

## 2013-07-03 ENCOUNTER — Telehealth: Payer: Self-pay | Admitting: Radiology

## 2013-07-03 ENCOUNTER — Telehealth: Payer: Self-pay

## 2013-07-03 MED ORDER — AMPHETAMINE-DEXTROAMPHETAMINE 5 MG PO TABS: 5.0000 mg | ORAL_TABLET | Freq: Two times a day (BID) | ORAL | Status: DC

## 2013-07-03 NOTE — Telephone Encounter (Signed)
Please call ,concerning how she should take her Adderall.

## 2013-07-03 NOTE — Telephone Encounter (Signed)
Pt CB after picking up her Rx and was very upset that it had only been written for 5 mg tablets. I advised her that Dr Landry Dyke note had instr'd to start at a reduced rate and to titrate back up slowly and had not specified a particular dose. Pt stated that Dr Tresa Endo had told her to start by taking 1/2 tablet (of her 15 mg BID) and she thinks that "going from 30 mg to 5 mg is a bit much". She stated that she has a call in to Dr Landry Dyke office, and I advised that if he will send a message by computer, fax or phone that either [name] or another PA can change Rx to what he recommends. She was quite rude and demanding and stated that she is going to send a message to [facility] and she better get this corrected today.

## 2013-07-03 NOTE — Addendum Note (Signed)
Addended by: Nelva Nay on: 07/03/2013 01:43 PM   Modules accepted: Orders

## 2013-07-03 NOTE — Telephone Encounter (Signed)
[name], FYI 

## 2013-07-03 NOTE — Telephone Encounter (Signed)
You signed in a Rx for patients Adderall into the Rx pick up for patient, it is not in the box for pick up, I am concerned she has picked up the Rx and it was not signed for, to you [facility]

## 2013-07-03 NOTE — Telephone Encounter (Signed)
Pt. Called no answer left message

## 2013-07-03 NOTE — Telephone Encounter (Signed)
Rx was written for adderall 5 mg daily #60.  Discussed with Dr. Merla Riches who agrees that 5 mg bid is reasonable with titration back up based on recommendations of Cardiologist and PCP.

## 2013-07-03 NOTE — Telephone Encounter (Signed)
Called pt and informed her that dr. Tresa Endo advised her to cut back for now then gradually increase back up to 30 mg over time

## 2013-07-03 NOTE — Telephone Encounter (Signed)
Patient came to walk in 102 stating that her Adderrall script was written incorrectly and the pharmacy would not fill it. Gave the script to Elease Etienne, LPN who delivered it to a PA to review. Patient waited in the lobby for 15 min and stated that she could not wait any longer because she had to go to work. Explained to patient that it would only take a few more minutes to rewrite if she could wait. Patient made a scene in the lobby and expressed her anger towards the situation and the practice and stated that she wants all of her information "deleted" from our system. Her script became available about 5 minutes after she left. I called patient to let her know and said she wants nothing else from [place] and to throw her script away. I did as instructed and script was placed in the HIPPA bin.

## 2013-07-07 MED ORDER — AMPHETAMINE-DEXTROAMPHETAMINE 15 MG PO TABS: 7.5000 mg | ORAL_TABLET | Freq: Two times a day (BID) | ORAL | Status: DC

## 2013-07-07 NOTE — Telephone Encounter (Signed)
Previous Rx for 5 mg tablets was destroyed.  New Rx printed. Patient aware.  Meds ordered this encounter  Medications  . amphetamine-dextroamphetamine (ADDERALL) 15 MG tablet    Sig: Take 0.5 tablets (7.5 mg total) by mouth 2 (two) times daily.    Dispense:  30 tablet    Refill:  0    Order Specific Question:  Supervising Provider    Answer:  DOOLITTLE, ROBERT P [[id number]]

## 2013-07-09 ENCOUNTER — Telehealth: Payer: Self-pay

## 2013-07-09 ENCOUNTER — Telehealth (HOSPITAL_COMMUNITY): Payer: Self-pay | Admitting: *Deleted

## 2013-07-09 ENCOUNTER — Telehealth: Payer: Self-pay | Admitting: Cardiovascular Disease

## 2013-07-09 MED ORDER — LOSARTAN POTASSIUM 25 MG PO TABS: 25.0000 mg | ORAL_TABLET | Freq: Every day | ORAL | Status: DC

## 2013-07-09 NOTE — Telephone Encounter (Signed)
Stop Lisinopril, start Cozaar 25 mg daily.  Corine Shelter PA-C 07/09/2013 5:06 PM

## 2013-07-09 NOTE — Telephone Encounter (Signed)
Lynn Harvey Dr. Tresa Endo increase her lisinopril from 2.5 mg to 5 mg. She has now developed a dry cough. Please advise.

## 2013-07-09 NOTE — Telephone Encounter (Signed)
Patient states she has a dry cough and it is non productive and makes her throat hurt. She states she was just put on lisinopril by her cardiologist. She has placed a call to them since they put her on the medication.  Is waiting on a callback from their office.  Advised patient that she can call us back again if they are unable to help her.  She states she will do so.

## 2013-07-09 NOTE — Telephone Encounter (Signed)
Pt says she is not going to continue taking the Lisinopril,she wants to take something else.

## 2013-07-09 NOTE — Telephone Encounter (Signed)
Pt said she had a heart attack 2 weeks ago and they put her on Lisinopril. She has a dry cough and throat feel like she is stopping up. She wonder if it might be coming from the Lisinopril.

## 2013-07-09 NOTE — Telephone Encounter (Signed)
Patient informed of the medication change per [facility]. Losartan will be sent to the pharmacy. Patient also informed that it may take 1-2 weeks for the cough to subside.

## 2013-07-09 NOTE — Telephone Encounter (Signed)
Agree with advice given.  She may have cough due to adverse effect of ACEI.  But it could be a viral illness causing drainage in her throat.  While she's waiting for the call back from cardiology, she could take an OTC oral antihistamine (Claritin, Zyrtec or Allegra) and OTC Mucinex (to thin the mucous and make it less irritating to the back of her throat).

## 2013-07-09 NOTE — Telephone Encounter (Signed)
Patient had a heart attack two weeks ago.    Quit smoking two weeks ago.  She now has phlegm in her throat.  The constant clearing of the throat Is driving everyone crazy.  What can she do?  [phone]

## 2013-07-13 NOTE — Telephone Encounter (Signed)
She states that nurse at cardiology changed lisinopril to losarta and it seems to have worked. Advised her that she is still having something going on with one side of her throat and her ear.  Advised patient to try oral antihistamine like claritin, zyrtec or allegra and an otc mucinex, but she declined.  She stated if she was still having sx tomorrow she would come in to the office to see [name].

## 2013-07-16 ENCOUNTER — Ambulatory Visit (INDEPENDENT_AMBULATORY_CARE_PROVIDER_SITE_OTHER): Payer: 59 | Admitting: Family Medicine

## 2013-07-16 ENCOUNTER — Encounter: Payer: Self-pay | Admitting: Physician Assistant

## 2013-07-16 ENCOUNTER — Emergency Department (HOSPITAL_COMMUNITY): Payer: 59

## 2013-07-16 ENCOUNTER — Encounter (HOSPITAL_COMMUNITY): Payer: Self-pay | Admitting: General Practice

## 2013-07-16 ENCOUNTER — Observation Stay (HOSPITAL_COMMUNITY)
Admission: EM | Admit: 2013-07-16 | Discharge: 2013-07-18 | Disposition: A | Discharge status: Home / Self Care | Payer: 59 | Attending: Family Medicine | Admitting: Family Medicine

## 2013-07-16 VITALS — BP 150/82 | HR 79 | Temp 98.4°F | Resp 16 | Ht 63.75 in | Wt 133.8 lb

## 2013-07-16 DIAGNOSIS — F411 Generalized anxiety disorder: Secondary | ICD-10-CM | POA: Insufficient documentation

## 2013-07-16 DIAGNOSIS — E7800 Pure hypercholesterolemia, unspecified: Secondary | ICD-10-CM | POA: Diagnosis present

## 2013-07-16 DIAGNOSIS — Z8632 Personal history of gestational diabetes: Secondary | ICD-10-CM | POA: Insufficient documentation

## 2013-07-16 DIAGNOSIS — R079 Chest pain, unspecified: Principal | ICD-10-CM | POA: Insufficient documentation

## 2013-07-16 DIAGNOSIS — F3289 Other specified depressive episodes: Secondary | ICD-10-CM | POA: Insufficient documentation

## 2013-07-16 DIAGNOSIS — F32A Depression, unspecified: Secondary | ICD-10-CM

## 2013-07-16 DIAGNOSIS — R52 Pain, unspecified: Secondary | ICD-10-CM | POA: Insufficient documentation

## 2013-07-16 DIAGNOSIS — I252 Old myocardial infarction: Secondary | ICD-10-CM | POA: Insufficient documentation

## 2013-07-16 DIAGNOSIS — R0789 Other chest pain: Secondary | ICD-10-CM

## 2013-07-16 DIAGNOSIS — I214 Non-ST elevation (NSTEMI) myocardial infarction: Secondary | ICD-10-CM

## 2013-07-16 DIAGNOSIS — F988 Other specified behavioral and emotional disorders with onset usually occurring in childhood and adolescence: Secondary | ICD-10-CM

## 2013-07-16 DIAGNOSIS — Z8719 Personal history of other diseases of the digestive system: Secondary | ICD-10-CM | POA: Insufficient documentation

## 2013-07-16 DIAGNOSIS — I10 Essential (primary) hypertension: Secondary | ICD-10-CM | POA: Insufficient documentation

## 2013-07-16 DIAGNOSIS — Z720 Tobacco use: Secondary | ICD-10-CM

## 2013-07-16 DIAGNOSIS — Z79899 Other long term (current) drug therapy: Secondary | ICD-10-CM | POA: Insufficient documentation

## 2013-07-16 DIAGNOSIS — Z87891 Personal history of nicotine dependence: Secondary | ICD-10-CM | POA: Insufficient documentation

## 2013-07-16 DIAGNOSIS — F329 Major depressive disorder, single episode, unspecified: Secondary | ICD-10-CM

## 2013-07-16 DIAGNOSIS — E785 Hyperlipidemia, unspecified: Secondary | ICD-10-CM

## 2013-07-16 DIAGNOSIS — Z886 Allergy status to analgesic agent status: Secondary | ICD-10-CM | POA: Insufficient documentation

## 2013-07-16 HISTORY — DX: Atherosclerotic heart disease of native coronary artery without angina pectoris: I25.10

## 2013-07-16 LAB — BASIC METABOLIC PANEL
BUN: 6 mg/dL (ref 6–23)
CHLORIDE: 101 meq/L (ref 96–112)
CO2: 22 meq/L (ref 19–32)
CREATININE: 0.72 mg/dL (ref 0.50–1.10)
Calcium: 9.2 mg/dL (ref 8.4–10.5)
GFR calc non Af Amer: >90 mL/min (ref >90)
Glucose, Bld: 109 mg/dL — ABNORMAL HIGH (ref 70–99)
Potassium: 3.9 mEq/L (ref 3.7–5.3)
Sodium: 138 mEq/L (ref 137–147)

## 2013-07-16 LAB — TROPONIN I

## 2013-07-16 LAB — CBC
HEMATOCRIT: 36.1 % (ref 36.0–46.0)
Hemoglobin: 12.7 g/dL (ref 12.0–15.0)
MCH: 31.4 pg (ref 26.0–34.0)
MCHC: 35.2 g/dL (ref 30.0–36.0)
MCV: 89.1 fL (ref 78.0–100.0)
PLATELETS: 254 10*3/uL (ref 150–400)
RBC: 4.05 MIL/uL (ref 3.87–5.11)
RDW: 13 % (ref 11.5–15.5)
WBC: 6.9 10*3/uL (ref 4.0–10.5)

## 2013-07-16 LAB — I-STAT TROPONIN, ED: TROPONIN I, POC: 0 ng/mL (ref 0.00–0.08)

## 2013-07-16 MED ORDER — SODIUM CHLORIDE 0.9 % IJ SOLN
3.0000 mL | Freq: Two times a day (BID) | INTRAMUSCULAR | Status: DC
  Administered 2013-07-16 – 2013-07-18 (×4): 3 mL via INTRAVENOUS

## 2013-07-16 MED ORDER — ACETAMINOPHEN 650 MG RE SUPP: 650.0000 mg | Freq: Four times a day (QID) | RECTAL | Status: DC | PRN

## 2013-07-16 MED ORDER — POLYETHYLENE GLYCOL 3350 17 G PO PACK
17.0000 g | PACK | Freq: Every day | ORAL | Status: DC | PRN
  Filled 2013-07-16: qty 1

## 2013-07-16 MED ORDER — HEPARIN SODIUM (PORCINE) 5000 UNIT/ML IJ SOLN
5000.0000 [IU] | Freq: Three times a day (TID) | INTRAMUSCULAR | Status: DC
  Filled 2013-07-16 (×5): qty 1

## 2013-07-16 MED ORDER — ONDANSETRON HCL 4 MG/2ML IJ SOLN: 4.0000 mg | Freq: Four times a day (QID) | INTRAMUSCULAR | Status: DC | PRN

## 2013-07-16 MED ORDER — SODIUM CHLORIDE 0.9 % IV SOLN: 250.0000 mL | INTRAVENOUS | Status: DC | PRN

## 2013-07-16 MED ORDER — ATORVASTATIN CALCIUM 40 MG PO TABS
40.0000 mg | ORAL_TABLET | Freq: Every day | ORAL | Status: DC
  Administered 2013-07-16 – 2013-07-17 (×2): 40 mg via ORAL
  Filled 2013-07-16 (×3): qty 1

## 2013-07-16 MED ORDER — LOSARTAN POTASSIUM 25 MG PO TABS
25.0000 mg | ORAL_TABLET | Freq: Every day | ORAL | Status: DC
  Administered 2013-07-16 – 2013-07-18 (×3): 25 mg via ORAL
  Filled 2013-07-16 (×3): qty 1

## 2013-07-16 MED ORDER — ASPIRIN 81 MG PO CHEW
243.0000 mg | CHEWABLE_TABLET | Freq: Once | ORAL | Status: AC
  Administered 2013-07-16: 243 mg via ORAL

## 2013-07-16 MED ORDER — TICAGRELOR 90 MG PO TABS
90.0000 mg | ORAL_TABLET | Freq: Two times a day (BID) | ORAL | Status: DC
  Administered 2013-07-16 – 2013-07-18 (×4): 90 mg via ORAL
  Filled 2013-07-16 (×6): qty 1

## 2013-07-16 MED ORDER — METOPROLOL TARTRATE 25 MG PO TABS
25.0000 mg | ORAL_TABLET | Freq: Two times a day (BID) | ORAL | Status: DC
  Administered 2013-07-16 – 2013-07-18 (×4): 25 mg via ORAL
  Filled 2013-07-16 (×6): qty 1

## 2013-07-16 MED ORDER — BUPROPION HCL ER (XL) 300 MG PO TB24
300.0000 mg | ORAL_TABLET | ORAL | Status: DC
Start: 2013-07-17 — End: 2013-07-18
  Administered 2013-07-17 – 2013-07-18 (×2): 300 mg via ORAL
  Filled 2013-07-16 (×4): qty 1

## 2013-07-16 MED ORDER — ALPRAZOLAM 0.5 MG PO TABS
0.5000 mg | ORAL_TABLET | Freq: Two times a day (BID) | ORAL | Status: DC | PRN
  Administered 2013-07-16 – 2013-07-17 (×2): 0.5 mg via ORAL
  Filled 2013-07-16 (×2): qty 1

## 2013-07-16 MED ORDER — SODIUM CHLORIDE 0.9 % IJ SOLN: 3.0000 mL | Freq: Two times a day (BID) | INTRAMUSCULAR | Status: DC

## 2013-07-16 MED ORDER — NITROGLYCERIN 0.4 MG SL SUBL
0.4000 mg | SUBLINGUAL_TABLET | Freq: Once | SUBLINGUAL | Status: AC
  Administered 2013-07-16: 0.4 mg via SUBLINGUAL

## 2013-07-16 MED ORDER — ASPIRIN EC 81 MG PO TBEC
81.0000 mg | DELAYED_RELEASE_TABLET | Freq: Every day | ORAL | Status: DC
  Administered 2013-07-18: 81 mg via ORAL
  Filled 2013-07-16 (×2): qty 1

## 2013-07-16 MED ORDER — ACETAMINOPHEN 325 MG PO TABS: 650.0000 mg | ORAL_TABLET | Freq: Four times a day (QID) | ORAL | Status: DC | PRN

## 2013-07-16 MED ORDER — NITROGLYCERIN 0.4 MG SL SUBL
0.4000 mg | SUBLINGUAL_TABLET | SUBLINGUAL | Status: DC | PRN
  Administered 2013-07-16 (×3): 0.4 mg via SUBLINGUAL

## 2013-07-16 MED ORDER — ONDANSETRON HCL 4 MG PO TABS: 4.0000 mg | ORAL_TABLET | Freq: Four times a day (QID) | ORAL | Status: DC | PRN

## 2013-07-16 MED ORDER — SODIUM CHLORIDE 0.9 % IJ SOLN: 3.0000 mL | INTRAMUSCULAR | Status: DC | PRN

## 2013-07-16 NOTE — ED Provider Notes (Signed)
CSN: [id number]     Arrival date & time 07/16/13  1336 History   First MD Initiated Contact with Patient 07/16/13 1338     Chief Complaint  Patient presents with  . Chest Pain     (Consider location/radiation/quality/duration/timing/severity/associated sxs/prior Treatment) HPI Comments: Patient is a 48 yo F PMHx significant for MI (06/24/13), HTN, h/o tobacco abuse presenting to the ED for acute onset central chest pressure with radiation to left neck and arm that began at 9AM this morning. Patient states she went to her regular doctor today and was advised to come to the ER. She was given 3 SL nitro, and 324mg  ASA with improvement of CP. Denies any associated SOB, nausea, diaphoresis, emesis. Patient was seen by Dr. Tresa EndoKelly during her hospitalization with a cardiac cath done. The cath showed single-vessel coronary artery disease with proximal LAD calcification, proximal 30% narrowing before the septal perforator and diagonal vessel with focal 85% stenosis just distal and up to the diagonal vessel. Patient is due for her echocardiogram next month with follow up with Dr. Tresa EndoKelly. Patient with strong family history.    Patient is a 48 y.o. female presenting with chest pain.  Chest Pain Associated symptoms: no abdominal pain, no cough, no fever, no nausea, no shortness of breath and not vomiting     Past Medical History  Diagnosis Date  . Hypertension   . Depression   . Insomnia   . Gestational diabetes   . Allergy   . Anxiety   . Rectal abnormality   . Myocardial infarction 06/2013   Past Surgical History  Procedure Laterality Date  . Wrist surgery  1987    RIGHT; bone from forearm grafted to wrist  . Abdominal hysterectomy    . Tonsilectomy, adenoidectomy, bilateral myringotomy and tubes    . Colon surgery     Family History  Problem Relation Age of Onset  . Cancer Mother   . COPD Mother     emphysema  . Heart disease Father [id number]    s/p 3V CABG  . Depression Father   . Mental  illness Sister     depression  . Depression Sister   . Mental illness Son     ADHD, bipolar disorder  . Diabetes Maternal Grandmother   . Heart disease Maternal Grandmother   . Diabetes Maternal Grandfather   . Cancer Maternal Grandfather     lung cancer  . Diabetes Paternal Grandmother   . Kidney disease Paternal Grandmother   . Diabetes Paternal Grandfather   . Heart disease Paternal Grandfather    History  Substance Use Topics  . Smoking status: Former Smoker -- 2.00 packs/day for 20 years    Types: Cigarettes    Start date: 12/25/1982  . Smokeless tobacco: Never Used     Comment: quit 06/24/13  . Alcohol Use: 14.4 oz/week    24 Cans of beer per week     Comment: more since fall 2012   OB History   Grav Para Term Preterm Abortions TAB SAB Ect Mult Living   5 3 2 1 2  0 1 1 0 3     Review of Systems  Constitutional: Negative for fever and chills.  Respiratory: Negative for cough and shortness of breath.   Cardiovascular: Positive for chest pain.  Gastrointestinal: Negative for nausea, vomiting, abdominal pain and diarrhea.  All other systems reviewed and are negative.     Allergies  Morphine and related  Home Medications   Prior to Admission  medications   Medication Sig Start Date End Date Taking? Authorizing Provider  ALPRAZolam Prudy Feeler) 0.5 MG tablet Take 1 tablet (0.5 mg total) by mouth 2 (two) times daily as needed for sleep or anxiety. 06/29/13  Yes Morrell Riddle, PA-C  amphetamine-dextroamphetamine (ADDERALL) 15 MG tablet Take 0.5 tablets (7.5 mg total) by mouth 2 (two) times daily. 07/07/13  Yes [name] S Jeffery, PA-C  aspirin EC 81 MG EC tablet Take 1 tablet (81 mg total) by mouth daily. 06/26/13  Yes Myra Rude, MD  atorvastatin (LIPITOR) 40 MG tablet Take 1 tablet (40 mg total) by mouth daily at 6 PM. 06/26/13  Yes Myra Rude, MD  BRILINTA 90 MG TABS tablet Take 90 mg by mouth 2 (two) times daily.  06/26/13  Yes Historical Provider, MD  buPROPion  (WELLBUTRIN XL) 150 MG 24 hr tablet Take 2 tablets (300 mg total) by mouth every morning. 04/23/13  Yes [name] S Jeffery, PA-C  losartan (COZAAR) 25 MG tablet Take 1 tablet (25 mg total) by mouth daily. 07/09/13  Yes Luke K Kilroy, PA-C  metoprolol tartrate (LOPRESSOR) 25 MG tablet Take 1 tablet (25 mg total) by mouth 2 (two) times daily. 07/01/13  Yes [name], MD  nitroGLYCERIN (NITROSTAT) 0.4 MG SL tablet Place 1 tablet (0.4 mg total) under the tongue every 5 (five) minutes as needed for chest pain. 06/26/13  Yes Myra Rude, MD  Pseudoeph-Doxylamine-DM-APAP (NYQUIL PO) Take 5 mLs by mouth daily as needed (for cough/sore throat).   Yes Historical Provider, MD  valACYclovir (VALTREX) 500 MG tablet Take 500 mg by mouth 3 (three) times daily as needed (for fever blister).  04/13/13  Yes Historical Provider, MD   BP 119/74  Pulse 70  Temp(Src) 98.8 F (37.1 C) (Oral)  Resp 11  SpO2 99% Physical Exam  Vitals reviewed. Constitutional: She is oriented to person, place, and time. She appears well-developed and well-nourished. No distress.  HENT:  Head: Normocephalic and atraumatic.  Right Ear: External ear normal.  Left Ear: External ear normal.  Nose: Nose normal.  Mouth/Throat: No oropharyngeal exudate.  Eyes: Conjunctivae are normal.  Neck: Neck supple.  Cardiovascular: Normal rate, regular rhythm, normal heart sounds and intact distal pulses.   Pulmonary/Chest: Effort normal and breath sounds normal. No respiratory distress. She exhibits no tenderness.  Abdominal: Soft. There is no tenderness.  Musculoskeletal: Normal range of motion. She exhibits no edema.  Neurological: She is alert and oriented to person, place, and time.  Skin: Skin is warm and dry. She is not diaphoretic.    ED Course  Procedures (including critical care time) Medications  nitroGLYCERIN (NITROSTAT) SL tablet 0.4 mg (not administered)    Labs Review Labs Reviewed  BASIC METABOLIC PANEL - Abnormal;  Notable for the following:    Glucose, Bld 109 (*)    All other components within normal limits  CBC  TROPONIN I  Rosezena Sensor, ED    Imaging Review Dg Chest 2 View  07/16/2013   CLINICAL DATA:  Left-sided chest pain  EXAM: CHEST  2 VIEW  COMPARISON:  06/24/2013  FINDINGS: Normal mediastinum and cardiac silhouette. Normal pulmonary vasculature. No evidence of effusion, infiltrate, or pneumothorax. No acute bony abnormality.  IMPRESSION: No acute cardiopulmonary process.   Electronically Signed   By: Genevive Bi M.D.   On: 07/16/2013 14:49     EKG Interpretation   Date/Time:  Thursday Jul 16 2013 13:46:50 EDT Ventricular Rate:  86 PR Interval:  159  QRS Duration: 76 QT Interval:  356 QTC Calculation: 426 R Axis:   77 Text Interpretation:  Sinus rhythm Nonspecific T abnormalities, diffuse  leads Confirmed by Juleen China  MD, STEPHEN ([id number]) on 07/16/2013 2:07:31 PM      MDM   Final diagnoses:  Chest pain    Filed Vitals:   07/16/13 1430  BP: 119/74  Pulse: 70  Temp:   Resp: 11   Afebrile, NAD, non-toxic appearing, AAOx4.   Concern for cardiac etiology of Chest Pain. Hospitalist has been consulted and will see patient in the ED for likely admit. Pt does not meet criteria for CP protocol and a further evaluation is recommended. Pt has been re-evaluated prior to consult and VSS, NAD, heart RRR, pain 0/10, lungs CTAB. No acute abnormalities found on EKG and first round of cardiac enzymes negative. This case was discussed with Dr. Juleen China who has seen the patient and agrees with plan to admit to [facility] Medicine Teaching Service.      Jeannetta Ellis, PA-C 07/16/13 1610

## 2013-07-16 NOTE — Progress Notes (Signed)
Still no relief after #3 NTG. Pt states "i have back trouble maybe that's what it is or I may be just got scared" teaching services notified. No new orders received.

## 2013-07-16 NOTE — H&P (Signed)
Family Medicine Teaching [facility] Admission History and Physical Service Pager: [phone]  Patient name: Lynn Harvey Medical record number: [id number] Date of birth: [birth year] Age: 48 y.o. Gender: female  Primary Care Provider: JEFFERY,[name], PA-C Consultants: Cardiology Code Status: Full  Chief Complaint: Chest pain  Assessment and Plan: Lynn Harvey is a 48 y.o. female presenting with chest pain x 1 day relieved by nitro. PMH is significant for recent NSTEMI beginning of May (85% stenosis distal LAD s/p successful balloon angiplasty), hyperlipidemia, depression, ADHD, prediabetes (A1c 5.7), tobacco and ethanol abuse.  # Chest pain: recent NSTEMI with cath on 5/6.15 s/p balloon angioplasty, discharged with ASA and brilinta. ED workup: i-stat trop negative, CBC and bmet are unremarkable; EKG with TWI in III, V2-V5 that appear improved compared to prior EKG on 5/7; CXR with no acute disease. - admit to telemetry - continuous cardiac monitoring - continue ASA, brilinta - cycle troponins x 3 - repeat labs and EKG in the AM - consult to cardiology given recent history of NSTEMI and PCI  # Hypertension: stable - continue home meds: metoprolol 25mg  bid, losartan 25mg  qday  # HLD: on atorvastatin 40mg . Last lipid panel 06/24/13 LDL 108, HDL 39. - continue home atorvastatin 40mg   # ADHD: stable - hold home adderall 15mg  BID in setting of possible cardiac etiology  # Depression: stable - continue home wellbutrin xl, xanax  # Tobacco abuse: 1.5 PPD x 45yrs, has stopped since last admission - nicotine patch  # EtOH abuse: per last admission H&P would go through "2 cases of beer in a weekend with her husband". No evidence of withdrawal during last hospital course - consider CIWA if patient starts showing any evidence of withdrawal  FEN/GI: HHD, saline lock, NPO at midnight pending cardiology recs Prophylaxis: heparin sq  Disposition: admit for observation/chest pain  ruleout  History of Present Illness: Lynn Harvey is a 48 y.o. female presenting with chest pain and pressure this morning when she was at work around 9:30am. Pain not very severe, never above 3/10. She was sitting still when this happened. The pain felt like a squeezing sensation in the center of her chest; not made worse with walking. The tightening also went into her throat and in her left shoulder. She went to her PCP's office, who sent her to the ER via EMS after giving 3 x nitro. Her pain resolved during the ambulance ride. She does say she had a stressful morning this morning at work. She has been compliant with Brilinta and aspirin therapy. She did not take her metoprolol this morning.  She has not had pain similar to this since leaving the hospital. She did have to stop taking lisinopril after her hospitalization due to sore throat and difficulty swallowing from frequently clearing her throat (solids and liquids). She saw cardiologist Dr. Tresa Endo once. Has appointment on June 10 to have an echo, then a follow up visit with Dr. Tresa Endo on the 18th.  No recent heartburn, dysuria, abdominal pain, muscle/aches pain. Has not smoked cigarettes in the last 22 days. Has had about 5 beers in the past week. No symptoms of withdrawal, denies fevers/chills and tremors.  Has been getting short of breath easier when walking dogs. Still able to walk and do her regular activities, just notices it more. No nausea/vomiting.   Review Of Systems: Per HPI with the following additions: none Otherwise 12 point review of systems was performed and was unremarkable.  Patient Active Problem List   Diagnosis  Date Noted  . HTN (hypertension) 06/25/2013  . Hyperlipidemia 06/25/2013  . Chest pain 06/24/2013  . NSTEMI (non-ST elevated myocardial infarction) 06/24/2013  . Tobacco abuse 06/10/2012  . Allergic rhinitis 06/10/2012  . ADD (attention deficit disorder) 03/06/2012  . Elevated BP 04/24/2011  . Depression  04/12/2011   Past Medical History: Past Medical History  Diagnosis Date  . Hypertension   . Depression   . Insomnia   . Gestational diabetes   . Allergy   . Anxiety   . Rectal abnormality   . Myocardial infarction 06/2013   Past Surgical History: Past Surgical History  Procedure Laterality Date  . Wrist surgery  1987    RIGHT; bone from forearm grafted to wrist  . Abdominal hysterectomy    . Tonsilectomy, adenoidectomy, bilateral myringotomy and tubes    . Colon surgery     Social History: History  Substance Use Topics  . Smoking status: Former Smoker -- 2.00 packs/day for 20 years    Types: Cigarettes    Start date: 12/25/1982  . Smokeless tobacco: Never Used     Comment: quit 06/24/13  . Alcohol Use: 14.4 oz/week    24 Cans of beer per week     Comment: more since fall 2012   Additional social history: has not smoked since leaving hospital (22 days), still drinking but less (5 drinks over last 7 days).  Please also refer to relevant sections of EMR.  Family History: Family History  Problem Relation Age of Onset  . Cancer Mother   . COPD Mother     emphysema  . Heart disease Father 8150    s/p 3V CABG  . Depression Father   . Mental illness Sister     depression  . Depression Sister   . Mental illness Son     ADHD, bipolar disorder  . Diabetes Maternal Grandmother   . Heart disease Maternal Grandmother   . Diabetes Maternal Grandfather   . Cancer Maternal Grandfather     lung cancer  . Diabetes Paternal Grandmother   . Kidney disease Paternal Grandmother   . Diabetes Paternal Grandfather   . Heart disease Paternal Grandfather    Allergies and Medications: Allergies  Allergen Reactions  . Morphine And Related Nausea And Vomiting   No current facility-administered medications on file prior to encounter.   Current Outpatient Prescriptions on File Prior to Encounter  Medication Sig Dispense Refill  . ALPRAZolam (XANAX) 0.5 MG tablet Take 1 tablet (0.5 mg  total) by mouth 2 (two) times daily as needed for sleep or anxiety.  60 tablet  0  . amphetamine-dextroamphetamine (ADDERALL) 15 MG tablet Take 0.5 tablets (7.5 mg total) by mouth 2 (two) times daily.  30 tablet  0  . aspirin EC 81 MG EC tablet Take 1 tablet (81 mg total) by mouth daily.  30 tablet  0  . atorvastatin (LIPITOR) 40 MG tablet Take 1 tablet (40 mg total) by mouth daily at 6 PM.  30 tablet  0  . BRILINTA 90 MG TABS tablet Take 90 mg by mouth 2 (two) times daily.       Marland Kitchen. buPROPion (WELLBUTRIN XL) 150 MG 24 hr tablet Take 2 tablets (300 mg total) by mouth every morning.  60 tablet  5  . losartan (COZAAR) 25 MG tablet Take 1 tablet (25 mg total) by mouth daily.  30 tablet  6  . metoprolol tartrate (LOPRESSOR) 25 MG tablet Take 1 tablet (25 mg total) by mouth  2 (two) times daily.  60 tablet  6  . nitroGLYCERIN (NITROSTAT) 0.4 MG SL tablet Place 1 tablet (0.4 mg total) under the tongue every 5 (five) minutes as needed for chest pain.  10 tablet  12  . valACYclovir (VALTREX) 500 MG tablet Take 500 mg by mouth 3 (three) times daily as needed (for fever blister).         Objective: BP 119/74  Pulse 70  Temp(Src) 98.8 F (37.1 C) (Oral)  Resp 11  SpO2 99% Exam: General: NAD HEENT: PERRL, EOMI, MM slightly dry Cardiovascular: RRR, normal s1/s2, 2/6 systolic murmur RUSB Respiratory: CTAB, normal effort Abdomen: soft, nontender, nondistended, normal bowel sounds Extremities: no edema or cyanosis. Feet slightly cool to touch. No calf tenderness. Skin: very tan, no rashes Neuro: alert and oriented, no focal deficits.  Labs and Imaging: CBC BMET   Recent Labs Lab 07/16/13 1406  WBC 6.9  HGB 12.7  HCT 36.1  PLT 254    Recent Labs Lab 07/16/13 1406  NA 138  K 3.9  CL 101  CO2 22  BUN 6  CREATININE 0.72  GLUCOSE 109*  CALCIUM 9.2     i-stat trop neg EKG: NSR, improved TWI in III, V2-V5  Dg Chest 2 View  07/16/2013   CLINICAL DATA:  Left-sided chest pain  EXAM: CHEST   2 VIEW  COMPARISON:  06/24/2013  FINDINGS: Normal mediastinum and cardiac silhouette. Normal pulmonary vasculature. No evidence of effusion, infiltrate, or pneumothorax. No acute bony abnormality.  IMPRESSION: No acute cardiopulmonary process.   Electronically Signed   By: Genevive Bi M.D.   On: 07/16/2013 14:49   Tawni Carnes, MD 07/16/2013, 3:11 PM PGY-1, [facility] Family Medicine FPTS Intern pager: [phone], text pages welcome

## 2013-07-16 NOTE — Progress Notes (Signed)
Still 2/10 and tightness, no relief after #2 NTG, so #3 NTG given. VSS

## 2013-07-16 NOTE — Progress Notes (Signed)
Subjective:    Patient ID: Lynn Harvey, female    DOB: 12/05/1965, 48 y.o.   MRN: [id number]   PCP: Jahquan Klugh, PA-C  Chief Complaint  Patient presents with  . Chest Pain    X 2 HOURS  WITH TIGHTNESS      Active Ambulatory Problems    Diagnosis Date Noted  . Depression 04/12/2011  . Elevated BP 04/24/2011  . ADD (attention deficit disorder) 03/06/2012  . Tobacco abuse 06/10/2012  . Allergic rhinitis 06/10/2012  . Chest pain 06/24/2013  . NSTEMI (non-ST elevated myocardial infarction) 06/24/2013  . HTN (hypertension) 06/25/2013  . Hyperlipidemia 06/25/2013   Resolved Ambulatory Problems    Diagnosis Date Noted  . No Resolved Ambulatory Problems   Past Medical History  Diagnosis Date  . Hypertension   . Insomnia   . Gestational diabetes   . Allergy   . Anxiety   . Rectal abnormality   . Myocardial infarction 06/2013    Past Surgical History  Procedure Laterality Date  . Wrist surgery  1987    RIGHT; bone from forearm grafted to wrist  . Abdominal hysterectomy    . Tonsilectomy, adenoidectomy, bilateral myringotomy and tubes    . Colon surgery      Allergies  Allergen Reactions  . Morphine And Related Nausea And Vomiting    Prior to Admission medications   Medication Sig Start Date End Date Taking? Authorizing Provider  ALPRAZolam Prudy Feeler(XANAX) 0.5 MG tablet Take 1 tablet (0.5 mg total) by mouth 2 (two) times daily as needed for sleep or anxiety. 06/29/13  Yes Morrell RiddleSarah L Weber, PA-C  amphetamine-dextroamphetamine (ADDERALL) 15 MG tablet Take 0.5 tablets (7.5 mg total) by mouth 2 (two) times daily. 07/07/13  Yes Nevaeh Casillas S Emonii Wienke, PA-C  aspirin EC 81 MG EC tablet Take 1 tablet (81 mg total) by mouth daily. 06/26/13  Yes Myra RudeJeremy E Schmitz, MD  BRILINTA 90 MG TABS tablet  06/26/13  Yes Historical Provider, MD  buPROPion (WELLBUTRIN XL) 150 MG 24 hr tablet Take 2 tablets (300 mg total) by mouth every morning. 04/23/13  Yes Destin Kittler S Masai Kidd, PA-C  fluticasone (FLONASE) 50  MCG/ACT nasal spray Place 2 sprays into the nose daily. 04/17/12  Yes Morrell RiddleSarah L Weber, PA-C  losartan (COZAAR) 25 MG tablet Take 1 tablet (25 mg total) by mouth daily. 07/09/13  Yes Luke K Kilroy, PA-C  metoprolol tartrate (LOPRESSOR) 25 MG tablet Take 1 tablet (25 mg total) by mouth 2 (two) times daily. 07/01/13  Yes Lennette Biharihomas A Kelly, MD  nitroGLYCERIN (NITROSTAT) 0.4 MG SL tablet Place 1 tablet (0.4 mg total) under the tongue every 5 (five) minutes as needed for chest pain. 06/26/13  Yes Myra RudeJeremy E Schmitz, MD  valACYclovir (VALTREX) 500 MG tablet  04/13/13  Yes Historical Provider, MD  atorvastatin (LIPITOR) 40 MG tablet Take 1 tablet (40 mg total) by mouth daily at 6 PM. 06/26/13   Myra RudeJeremy E Schmitz, MD    History   Social History  . Marital Status: Married    Spouse Name: Thereasa DistanceRodney    Number of Children: 3  . Years of Education: 12   Occupational History  . CUSTOMER SERVICE [employer]   Social History Main Topics  . Smoking status: Former Smoker -- 2.00 packs/day for 20 years    Types: Cigarettes    Start date: 12/25/1982  . Smokeless tobacco: Never Used     Comment: quit 06/24/13  . Alcohol Use: 14.4 oz/week    24 Cans  of beer per week     Comment: more since fall 2012  . Drug Use: No  . Sexual Activity: Yes    Partners: Male    Birth Control/ Protection: Surgical   Other Topics Concern  . None   Social History Narrative   Lives with her husband, their son [name], and her daughter Hospital doctor and Amber's son [name].    family history includes COPD in her mother; Cancer in her maternal grandfather and mother; Depression in her father and sister; Diabetes in her maternal grandfather, maternal grandmother, paternal grandfather, and paternal grandmother; Heart disease in her maternal grandmother and paternal grandfather; Heart disease (age of onset: 19) in her father; Kidney disease in her paternal grandmother; Mental illness in her sister and son. indicated that her mother is deceased. She  indicated that her father is alive. She indicated that her sister is alive. She indicated that her maternal grandmother is deceased. She indicated that her maternal grandfather is deceased. She indicated that her paternal grandmother is deceased. She indicated that her paternal grandfather is deceased. She indicated that her daughter is alive. She indicated that only one of her two sons is alive. She indicated that her grandchild is alive.   Chest Pain     Lowella Bandy is brought back urgently reporting 2 hours of left sided chest pain that radiates into the left shoulder. She is s/p AMI 3 weeks ago and is on Brilinta. She took an 81 mg ASA today. She also took a NTG at home about 25 minutes prior to arrival here, with some easing of her pain.  She did not take her metoprolol today, wondering if that was the cause of recent ST and cough (she's recently been switched from lisinopril to losartan).  Rates the discomfort a 3/10.  No SOB or dizziness.   Review of Systems  Cardiovascular: Positive for chest pain.       Objective:   Physical Exam  Vitals reviewed. Constitutional: She is oriented to person, place, and time. She appears well-developed and well-nourished. No distress.  Eyes: Conjunctivae are normal. No scleral icterus.  Neck: Normal range of motion. Neck supple. No thyromegaly present.  Cardiovascular: Normal rate and normal heart sounds.   Pulmonary/Chest: Effort normal and breath sounds normal.  Lymphadenopathy:    She has no cervical adenopathy.  Neurological: She is alert and oriented to person, place, and time.  Skin: Skin is warm and dry. She is not diaphoretic.  Psychiatric: She has a normal mood and affect. Her behavior is normal.   EKG reviewed with Drs. Clelia Croft and Neva Seat. Non-specific T wave changes are unchanged from her recent hospitalization.       Assessment & Plan:  1. Chest pain 2. Chest tightness O2 2 L by [place]. IV started.  ASA and NTG given with minimal change in  her discomfort. Transport to ED via EMS. - EKG 12-Lead - aspirin chewable tablet 243 mg; Chew 3 tablets (243 mg total) by mouth once. - nitroGLYCERIN (NITROSTAT) SL tablet 0.4 mg; Place 1 tablet (0.4 mg total) under the tongue once. - nitroGLYCERIN (NITROSTAT) SL tablet 0.4 mg; Place 1 tablet (0.4 mg total) under the tongue once.   Fernande Bras, PA-C Physician Assistant-Certified [facility]

## 2013-07-16 NOTE — Progress Notes (Signed)
Reviewed documentation and EKG and agree w/ assessment and plan. Yuval Nolet, MD MPH   

## 2013-07-16 NOTE — ED Notes (Signed)
Pt in from PCP office via [facility], per report pt L sided CP radiating into Neck & L arm, pt describes tightness today at 9:30, pt denies n/v/d, pt had procedure completed last wk here for 85% & 35 % blockage & dx with NSTEMI last week, pt rcvd x 3 SL nitro PTA, with relief of pain, pain free upon arrival to ED, pt A&O x4, follows commands, speaks in complete sentences, PT rcvd 324 mg ASA PTA

## 2013-07-16 NOTE — Progress Notes (Signed)
No relief NTG #2 given.

## 2013-07-16 NOTE — Progress Notes (Signed)
Notified teaching services of pt c/o of chest and neck tightness. Orders given to give a round of NTG. 1 NTG given for a discomfort of 2.

## 2013-07-16 NOTE — H&P (Signed)
Family Medicine Teaching Service Attending Note  I interviewed and examined patient Lynn Harvey and reviewed their tests and x-rays.  I discussed with Dr. Waynetta Sandy and reviewed their note for today.  I agree with their assessment and plan.     Additionally  Feels well now no chest pain Got better with nitro in ambulance Had emotional stress today Did not take her metoprolol this AM las dose last nigh  Ro for MI (doubt) Possible angina  Encourage to take BB regularly Notify cardiology

## 2013-07-17 ENCOUNTER — Encounter (HOSPITAL_COMMUNITY)
Admission: EM | Disposition: A | Discharge status: Home / Self Care | Payer: Self-pay | Source: Home / Self Care | Attending: Emergency Medicine

## 2013-07-17 DIAGNOSIS — F17200 Nicotine dependence, unspecified, uncomplicated: Secondary | ICD-10-CM

## 2013-07-17 DIAGNOSIS — F329 Major depressive disorder, single episode, unspecified: Secondary | ICD-10-CM

## 2013-07-17 DIAGNOSIS — I214 Non-ST elevation (NSTEMI) myocardial infarction: Secondary | ICD-10-CM

## 2013-07-17 DIAGNOSIS — F3289 Other specified depressive episodes: Secondary | ICD-10-CM

## 2013-07-17 DIAGNOSIS — R079 Chest pain, unspecified: Secondary | ICD-10-CM

## 2013-07-17 DIAGNOSIS — E785 Hyperlipidemia, unspecified: Secondary | ICD-10-CM

## 2013-07-17 DIAGNOSIS — I2510 Atherosclerotic heart disease of native coronary artery without angina pectoris: Secondary | ICD-10-CM

## 2013-07-17 HISTORY — PX: LEFT HEART CATHETERIZATION WITH CORONARY ANGIOGRAM: SHX5451

## 2013-07-17 LAB — PROTIME-INR
INR: 1.02 (ref 0.00–1.49)
Prothrombin Time: 13.2 seconds (ref 11.6–15.2)

## 2013-07-17 LAB — TROPONIN I

## 2013-07-17 SURGERY — LEFT HEART CATHETERIZATION WITH CORONARY ANGIOGRAM
Anesthesia: LOCAL

## 2013-07-17 MED ORDER — LORAZEPAM 1 MG PO TABS
1.0000 mg | ORAL_TABLET | Freq: Four times a day (QID) | ORAL | Status: DC | PRN
Start: 2013-07-17 — End: 2013-07-17

## 2013-07-17 MED ORDER — LIDOCAINE HCL (PF) 1 % IJ SOLN
INTRAMUSCULAR | Status: AC
  Filled 2013-07-17: qty 30

## 2013-07-17 MED ORDER — NITROGLYCERIN 0.2 MG/ML ON CALL CATH LAB
INTRAVENOUS | Status: AC
  Filled 2013-07-17: qty 1

## 2013-07-17 MED ORDER — ASPIRIN 81 MG PO CHEW
81.0000 mg | CHEWABLE_TABLET | ORAL | Status: AC
  Administered 2013-07-17: 81 mg via ORAL
  Filled 2013-07-17: qty 1

## 2013-07-17 MED ORDER — FENTANYL CITRATE 0.05 MG/ML IJ SOLN
INTRAMUSCULAR | Status: AC
  Filled 2013-07-17: qty 2

## 2013-07-17 MED ORDER — HEPARIN (PORCINE) IN NACL 2-0.9 UNIT/ML-% IJ SOLN
INTRAMUSCULAR | Status: AC
  Filled 2013-07-17: qty 1000

## 2013-07-17 MED ORDER — LORAZEPAM 2 MG/ML IJ SOLN: 1.0000 mg | Freq: Four times a day (QID) | INTRAMUSCULAR | Status: DC | PRN

## 2013-07-17 MED ORDER — VERAPAMIL HCL 2.5 MG/ML IV SOLN
INTRAVENOUS | Status: AC
  Filled 2013-07-17: qty 2

## 2013-07-17 MED ORDER — MIDAZOLAM HCL 2 MG/2ML IJ SOLN
INTRAMUSCULAR | Status: AC
  Filled 2013-07-17: qty 2

## 2013-07-17 MED ORDER — LORAZEPAM 1 MG PO TABS: 1.0000 mg | ORAL_TABLET | Freq: Four times a day (QID) | ORAL | Status: DC | PRN

## 2013-07-17 MED ORDER — ADULT MULTIVITAMIN W/MINERALS CH
1.0000 | ORAL_TABLET | Freq: Every day | ORAL | Status: DC
  Administered 2013-07-17 – 2013-07-18 (×2): 1 via ORAL
  Filled 2013-07-17 (×2): qty 1

## 2013-07-17 MED ORDER — HEPARIN SODIUM (PORCINE) 1000 UNIT/ML IJ SOLN
INTRAMUSCULAR | Status: AC
  Filled 2013-07-17: qty 1

## 2013-07-17 MED ORDER — SODIUM CHLORIDE 0.9 % IJ SOLN: 3.0000 mL | INTRAMUSCULAR | Status: DC | PRN

## 2013-07-17 MED ORDER — SODIUM CHLORIDE 0.9 % IV SOLN
INTRAVENOUS | Status: DC
  Administered 2013-07-17: 10:00:00 via INTRAVENOUS

## 2013-07-17 MED ORDER — SODIUM CHLORIDE 0.9 % IJ SOLN
3.0000 mL | Freq: Two times a day (BID) | INTRAMUSCULAR | Status: DC
  Administered 2013-07-17: 3 mL via INTRAVENOUS

## 2013-07-17 MED ORDER — SODIUM CHLORIDE 0.9 % IV SOLN: INTRAVENOUS | Status: AC

## 2013-07-17 MED ORDER — VITAMIN B-1 100 MG PO TABS
100.0000 mg | ORAL_TABLET | Freq: Every day | ORAL | Status: DC
  Administered 2013-07-17 – 2013-07-18 (×2): 100 mg via ORAL
  Filled 2013-07-17 (×2): qty 1

## 2013-07-17 MED ORDER — FOLIC ACID 1 MG PO TABS
1.0000 mg | ORAL_TABLET | Freq: Every day | ORAL | Status: DC
  Administered 2013-07-17 – 2013-07-18 (×2): 1 mg via ORAL
  Filled 2013-07-17 (×2): qty 1

## 2013-07-17 MED ORDER — SODIUM CHLORIDE 0.9 % IV SOLN: 250.0000 mL | INTRAVENOUS | Status: DC | PRN

## 2013-07-17 MED ORDER — THIAMINE HCL 100 MG/ML IJ SOLN
100.0000 mg | Freq: Every day | INTRAMUSCULAR | Status: DC
  Filled 2013-07-17 (×2): qty 1

## 2013-07-17 NOTE — CV Procedure (Signed)
      Cardiac Catheterization Operative Report  Lynn Harvey [id number] 5/29/20154:05 PM JEFFERY,[name], PA-C  Procedure Performed:  1. Left Heart Catheterization 2. Selective Coronary Angiography 3. Left ventricular angiogram  Operator: Verne Carrow, MD  Arterial access site:  Right radial artery.   Indication: 48 yo female with a history of tobacco abuse, alcohol abuse, CAD with prior cutting balloon angioplasty of the LAD per Dr. Tresa Endo three weeks ago in setting of NSTEMI. No stent placed mid LAD. Now admitted with chest pain. None for 24 hours. Troponin negative.                                      Procedure Details: The risks, benefits, complications, treatment options, and expected outcomes were discussed with the patient. The patient and/or family concurred with the proposed plan, giving informed consent. The patient was brought to the cath lab after IV hydration was begun and oral premedication was given. The patient was further sedated with Versed and Fentanyl. The right wrist was assessed with an Allens test which was positive. The right wrist was prepped and draped in a sterile fashion. 1% lidocaine was used for local anesthesia. Using the modified Seldinger access technique, a 5 French sheath was placed in the right radial artery. 3 mg Verapamil was given through the sheath. 3500 units IV heparin was given. Standard diagnostic catheters were used to perform selective coronary angiography. A pigtail catheter was used to perform a left ventricular angiogram. The sheath was removed from the right radial artery and a Terumo hemostasis band was applied at the arteriotomy site on the right wrist.   There were no immediate complications. The patient was taken to the recovery area in stable condition.   Hemodynamic Findings: Central aortic pressure: 110/55 Left ventricular pressure: 123/3/9  Angiographic Findings:  Left main: Short, no obstructive disease.   Left  Anterior Descending Artery: Moderate caliber vessel that courses to the apex. Proximal calcification noted. The proximal vessel has a 20% stenosis. The mid vessel just beyond the diagonal branch has diffuse 20% stenosis (site of cutting balloon angioplasty). The remainder of the LAD has mild diffuse plaque disease. The diagonal branch is a small to moderate caliber vessel with ostial 30% stenosis.   Circumflex Artery: Moderate caliber vessel with two small to moderate caliber obtuse marginal branches. No obstructive disease.   Right Coronary Artery: Small to moderate caliber co-dominant vessel with no obstructive disease.   Left Ventricular Angiogram: LVEF=55-60%.   Impression: 1. Single vessel CAD with patent angioplasty site mid LAD 2. Normal LV function 3. Possible coronary vasospasm  Recommendations: Continue medical management of CAD. Consider long acting nitrate (Imdur 30 mg Qdaily) for possible vasospasm. She can be discharged in am if stable and have f/u with [name] in our cardiology office.        Complications:  None. The patient tolerated the procedure well.

## 2013-07-17 NOTE — H&P (View-Only) (Signed)
CONSULT NOTE  Date: 07/17/2013               Patient Name:  Lynn HamburgerChantel N Ono MRN: [id number]  DOB: 02/20/1965 Age / Sex: 48 y.o., female        PCP: JEFFERY,[name] Primary Cardiologist: Tresa EndoKelly            Referring Physician: Deirdre Priesthambliss              Reason for Consult: Recurrent CP in patient with recent NSTEMI           History of Present Illness: Patient is a 48 y.o. female with a PMHx of CAD, hyperlipidemia, ADHD, , who [facility] for evaluation of recurrent cp.  Pt has hx of recent NSTEMI - s/p cutting balloon to distal LAD.   She had a stressful day yesterday and developed CP .  The pain was almost exactly the same as her MI pain but not as severe - chest pressure, radiation up to neck and shoulders, down left arm.  Was 3/ 10 .  Relieved ( somewhat slower that expected ) with SL NTG.    Troponin levels are negative.    Shx:  Stopped smoking 23 days ago.    FHx:  Strongly + for CAD.     Medications: Outpatient medications: Prescriptions prior to admission  Medication Sig Dispense Refill  . ALPRAZolam (XANAX) 0.5 MG tablet Take 1 tablet (0.5 mg total) by mouth 2 (two) times daily as needed for sleep or anxiety.  60 tablet  0  . amphetamine-dextroamphetamine (ADDERALL) 15 MG tablet Take 0.5 tablets (7.5 mg total) by mouth 2 (two) times daily.  30 tablet  0  . aspirin EC 81 MG EC tablet Take 1 tablet (81 mg total) by mouth daily.  30 tablet  0  . atorvastatin (LIPITOR) 40 MG tablet Take 1 tablet (40 mg total) by mouth daily at 6 PM.  30 tablet  0  . BRILINTA 90 MG TABS tablet Take 90 mg by mouth 2 (two) times daily.       Marland Kitchen. buPROPion (WELLBUTRIN XL) 150 MG 24 hr tablet Take 2 tablets (300 mg total) by mouth every morning.  60 tablet  5  . losartan (COZAAR) 25 MG tablet Take 1 tablet (25 mg total) by mouth daily.  30 tablet  6  . metoprolol tartrate (LOPRESSOR) 25 MG tablet Take 1 tablet (25 mg total) by mouth 2 (two) times daily.  60 tablet  6  .  nitroGLYCERIN (NITROSTAT) 0.4 MG SL tablet Place 1 tablet (0.4 mg total) under the tongue every 5 (five) minutes as needed for chest pain.  10 tablet  12  . Pseudoeph-Doxylamine-DM-APAP (NYQUIL PO) Take 5 mLs by mouth daily as needed (for cough/sore throat).      . valACYclovir (VALTREX) 500 MG tablet Take 500 mg by mouth 3 (three) times daily as needed (for fever blister).         Current medications: Current Facility-Administered Medications  Medication Dose Route Frequency Provider Last Rate Last Dose  . 0.9 %  sodium chloride infusion  250 mL Intravenous PRN Latrelle DodrillBrittany J McIntyre, MD      . acetaminophen (TYLENOL) tablet 650 mg  650 mg Oral Q6H PRN Latrelle DodrillBrittany J McIntyre, MD       Or  . acetaminophen (TYLENOL) suppository 650 mg  650 mg Rectal Q6H PRN Latrelle DodrillBrittany J McIntyre, MD      . ALPRAZolam Prudy Feeler(XANAX) tablet  0.5 mg  0.5 mg Oral BID PRN Latrelle Dodrill, MD   0.5 mg at 07/16/13 2129  . aspirin EC tablet 81 mg  81 mg Oral Daily Latrelle Dodrill, MD      . atorvastatin (LIPITOR) tablet 40 mg  40 mg Oral q1800 Latrelle Dodrill, MD   40 mg at 07/16/13 1850  . buPROPion (WELLBUTRIN XL) 24 hr tablet 300 mg  300 mg Oral BH-q7a Latrelle Dodrill, MD   300 mg at 07/17/13 1610  . heparin injection 5,000 Units  5,000 Units Subcutaneous 3 times per day Latrelle Dodrill, MD      . losartan (COZAAR) tablet 25 mg  25 mg Oral Daily Latrelle Dodrill, MD   25 mg at 07/16/13 1850  . metoprolol tartrate (LOPRESSOR) tablet 25 mg  25 mg Oral BID Latrelle Dodrill, MD   25 mg at 07/16/13 2125  . nitroGLYCERIN (NITROSTAT) SL tablet 0.4 mg  0.4 mg Sublingual Q5 min PRN Hurman Horn, MD   0.4 mg at 07/16/13 1915  . ondansetron (ZOFRAN) tablet 4 mg  4 mg Oral Q6H PRN Latrelle Dodrill, MD       Or  . ondansetron [facility]) injection 4 mg  4 mg Intravenous Q6H PRN Latrelle Dodrill, MD      . polyethylene glycol (MIRALAX / GLYCOLAX) packet 17 g  17 g Oral Daily PRN Latrelle Dodrill, MD      .  sodium chloride 0.9 % injection 3 mL  3 mL Intravenous Q12H Latrelle Dodrill, MD      . sodium chloride 0.9 % injection 3 mL  3 mL Intravenous Q12H Latrelle Dodrill, MD   3 mL at 07/16/13 2126  . sodium chloride 0.9 % injection 3 mL  3 mL Intravenous PRN Latrelle Dodrill, MD      . ticagrelor [facility]) tablet 90 mg  90 mg Oral BID Latrelle Dodrill, MD   90 mg at 07/16/13 2125     Allergies  Allergen Reactions  . Morphine And Related Nausea And Vomiting     Past Medical History  Diagnosis Date  . Hypertension   . Depression   . Insomnia   . Gestational diabetes   . Allergy   . Anxiety   . Rectal abnormality   . Myocardial infarction 06/2013  . Coronary artery disease     Past Surgical History  Procedure Laterality Date  . Wrist surgery  1987    RIGHT; bone from forearm grafted to wrist  . Abdominal hysterectomy    . Tonsilectomy, adenoidectomy, bilateral myringotomy and tubes    . Colon surgery      Family History  Problem Relation Age of Onset  . Cancer Mother   . COPD Mother     emphysema  . Heart disease Father 58    s/p 3V CABG  . Depression Father   . Mental illness Sister     depression  . Depression Sister   . Mental illness Son     ADHD, bipolar disorder  . Diabetes Maternal Grandmother   . Heart disease Maternal Grandmother   . Diabetes Maternal Grandfather   . Cancer Maternal Grandfather     lung cancer  . Diabetes Paternal Grandmother   . Kidney disease Paternal Grandmother   . Diabetes Paternal Grandfather   . Heart disease Paternal Grandfather     Social History:  reports that she has quit smoking. Her smoking use  included Cigarettes. She started smoking about 30 years ago. She has a 40 pack-year smoking history. She has never used smokeless tobacco. She reports that she drinks about 14.4 ounces of alcohol per week. She reports that she does not use illicit drugs.   Review of Systems: Constitutional:  denies fever, chills,  diaphoresis, appetite change and fatigue.  HEENT: denies photophobia, eye pain, redness, hearing loss, ear pain, congestion, sore throat, rhinorrhea, sneezing, neck pain, neck stiffness and tinnitus.  Respiratory: denies SOB, DOE, cough, chest tightness, and wheezing.  Cardiovascular: admits to chest pain,  Denies palpitations and leg swelling.  Gastrointestinal: denies nausea, vomiting, abdominal pain, diarrhea, constipation, blood in stool.  Genitourinary: denies dysuria, urgency, frequency, hematuria, flank pain and difficulty urinating.  Musculoskeletal: denies  myalgias, back pain, joint swelling, arthralgias and gait problem.   Skin: denies pallor, rash and wound.  Neurological: denies dizziness, seizures, syncope, weakness, light-headedness, numbness and headaches.   Hematological: denies adenopathy, easy bruising, personal or family bleeding history.  Psychiatric/ Behavioral: denies suicidal ideation, mood changes, confusion, nervousness, sleep disturbance and agitation.    Physical Exam: BP 112/61  Pulse 70  Temp(Src) 98 F (36.7 C) (Oral)  Resp 16  Wt 132 lb (59.875 kg)  SpO2 98%  Wt Readings from Last 3 Encounters:  07/17/13 132 lb (59.875 kg)  07/16/13 133 lb 12.8 oz (60.691 kg)  07/01/13 134 lb 3.2 oz (60.873 kg)    General: Vital signs reviewed and noted. Well-developed, well-nourished, in no acute distress; alert,   Head: Normocephalic, atraumatic, sclera anicteric,   Neck: Supple. Negative for carotid bruits. No JVD   Lungs:  Clear bilaterally, no  wheezes, rales, or rhonchi. Breathing is normal   Heart: RRR with S1 S2. No murmurs, rubs, or gallops   Abdomen:  Soft, non-tender, non-distended with normoactive bowel sounds. No hepatomegaly. No rebound/guarding. No obvious abdominal masses   MSK: Strength and the appear normal for age.   Extremities: No clubbing or cyanosis. No edema.  Distal pedal pulses are 2+ and equal   Neurologic: Alert and oriented X 3. Moves  all extremities spontaneously.  Psych: Responds to questions appropriately with a normal affect.     Lab results: Basic Metabolic Panel:  Recent Labs Lab 07/16/13 1406  NA 138  K 3.9  CL 101  CO2 22  GLUCOSE 109*  BUN 6  CREATININE 0.72  CALCIUM 9.2    Liver Function Tests: No results found for this basename: AST, ALT, ALKPHOS, BILITOT, PROT, ALBUMIN,  in the last 168 hours No results found for this basename: LIPASE, AMYLASE,  in the last 168 hours No results found for this basename: AMMONIA,  in the last 168 hours  CBC:  Recent Labs Lab 07/16/13 1406  WBC 6.9  HGB 12.7  HCT 36.1  MCV 89.1  PLT 254    Cardiac Enzymes:  Recent Labs Lab 07/16/13 1406 07/16/13 1819 07/17/13 0047 07/17/13 0509  TROPONINI <0.30 <0.30 <0.30 <0.30    BNP: No components found with this basename: POCBNP,   CBG: No results found for this basename: GLUCAP,  in the last 168 hours  Coagulation Studies: No results found for this basename: LABPROT, INR,  in the last 72 hours   Other results: EKG :  NSR,  TWI in anterior lateral leads.  No significant changes from ECG 3 weeks ago.  Imaging: Dg Chest 2 View  07/16/2013   CLINICAL DATA:  Left-sided chest pain  EXAM: CHEST  2 VIEW  COMPARISON:  06/24/2013  FINDINGS: Normal mediastinum and cardiac silhouette. Normal pulmonary vasculature. No evidence of effusion, infiltrate, or pneumothorax. No acute bony abnormality.  IMPRESSION: No acute cardiopulmonary process.   Electronically Signed   By: Genevive Bi M.D.   On: 07/16/2013 14:49     Assessment & Plan:  1. CAD:  She has had recurrent CP - very similar to her previous angina although not as bad.  I think we should proceed with repeat cath.  She had cutting balloon only - not stented.   Discussed risks, benefits, options of cath. She understands and agrees to proceed.   2. smokinmg :  Stopped 23 day s ago  3. ETOH abuse:  She has been drinking lots since the death of her  mother and her son.  I 've advised her to restart her counseling.     Vesta Mixer, Montez Hageman., MD, [facility] 07/17/2013, 8:27 AM Office - [phone] Pager [phone]

## 2013-07-17 NOTE — Consult Note (Signed)
CONSULT NOTE  Date: 07/17/2013               Patient Name:  Lynn Harvey MRN: [id number]  DOB: 02/20/1965 Age / Sex: 48 y.o., female        PCP: JEFFERY,[name] Primary Cardiologist: Tresa EndoKelly            Referring Physician: Deirdre Priesthambliss              Reason for Consult: Recurrent CP in patient with recent NSTEMI           History of Present Illness: Patient is a 48 y.o. female with a PMHx of CAD, hyperlipidemia, ADHD, , who was [facility] for evaluation of recurrent cp.  Pt has hx of recent NSTEMI - s/p cutting balloon to distal LAD.   She had a stressful day yesterday and developed CP .  The pain was almost exactly the same as her MI pain but not as severe - chest pressure, radiation up to neck and shoulders, down left arm.  Was 3/ 10 .  Relieved ( somewhat slower that expected ) with SL NTG.    Troponin levels are negative.    Shx:  Stopped smoking 23 days ago.    FHx:  Strongly + for CAD.     Medications: Outpatient medications: Prescriptions prior to admission  Medication Sig Dispense Refill  . ALPRAZolam (XANAX) 0.5 MG tablet Take 1 tablet (0.5 mg total) by mouth 2 (two) times daily as needed for sleep or anxiety.  60 tablet  0  . amphetamine-dextroamphetamine (ADDERALL) 15 MG tablet Take 0.5 tablets (7.5 mg total) by mouth 2 (two) times daily.  30 tablet  0  . aspirin EC 81 MG EC tablet Take 1 tablet (81 mg total) by mouth daily.  30 tablet  0  . atorvastatin (LIPITOR) 40 MG tablet Take 1 tablet (40 mg total) by mouth daily at 6 PM.  30 tablet  0  . BRILINTA 90 MG TABS tablet Take 90 mg by mouth 2 (two) times daily.       Marland Kitchen. buPROPion (WELLBUTRIN XL) 150 MG 24 hr tablet Take 2 tablets (300 mg total) by mouth every morning.  60 tablet  5  . losartan (COZAAR) 25 MG tablet Take 1 tablet (25 mg total) by mouth daily.  30 tablet  6  . metoprolol tartrate (LOPRESSOR) 25 MG tablet Take 1 tablet (25 mg total) by mouth 2 (two) times daily.  60 tablet  6  .  nitroGLYCERIN (NITROSTAT) 0.4 MG SL tablet Place 1 tablet (0.4 mg total) under the tongue every 5 (five) minutes as needed for chest pain.  10 tablet  12  . Pseudoeph-Doxylamine-DM-APAP (NYQUIL PO) Take 5 mLs by mouth daily as needed (for cough/sore throat).      . valACYclovir (VALTREX) 500 MG tablet Take 500 mg by mouth 3 (three) times daily as needed (for fever blister).         Current medications: Current Facility-Administered Medications  Medication Dose Route Frequency Provider Last Rate Last Dose  . 0.9 %  sodium chloride infusion  250 mL Intravenous PRN Latrelle DodrillBrittany J McIntyre, MD      . acetaminophen (TYLENOL) tablet 650 mg  650 mg Oral Q6H PRN Latrelle DodrillBrittany J McIntyre, MD       Or  . acetaminophen (TYLENOL) suppository 650 mg  650 mg Rectal Q6H PRN Latrelle DodrillBrittany J McIntyre, MD      . ALPRAZolam Prudy Feeler(XANAX) tablet  0.5 mg  0.5 mg Oral BID PRN Latrelle Dodrill, MD   0.5 mg at 07/16/13 2129  . aspirin EC tablet 81 mg  81 mg Oral Daily Latrelle Dodrill, MD      . atorvastatin (LIPITOR) tablet 40 mg  40 mg Oral q1800 Latrelle Dodrill, MD   40 mg at 07/16/13 1850  . buPROPion (WELLBUTRIN XL) 24 hr tablet 300 mg  300 mg Oral BH-q7a Latrelle Dodrill, MD   300 mg at 07/17/13 1610  . heparin injection 5,000 Units  5,000 Units Subcutaneous 3 times per day Latrelle Dodrill, MD      . losartan (COZAAR) tablet 25 mg  25 mg Oral Daily Latrelle Dodrill, MD   25 mg at 07/16/13 1850  . metoprolol tartrate (LOPRESSOR) tablet 25 mg  25 mg Oral BID Latrelle Dodrill, MD   25 mg at 07/16/13 2125  . nitroGLYCERIN (NITROSTAT) SL tablet 0.4 mg  0.4 mg Sublingual Q5 min PRN Hurman Horn, MD   0.4 mg at 07/16/13 1915  . ondansetron (ZOFRAN) tablet 4 mg  4 mg Oral Q6H PRN Latrelle Dodrill, MD       Or  . ondansetron [facility]) injection 4 mg  4 mg Intravenous Q6H PRN Latrelle Dodrill, MD      . polyethylene glycol (MIRALAX / GLYCOLAX) packet 17 g  17 g Oral Daily PRN Latrelle Dodrill, MD      .  sodium chloride 0.9 % injection 3 mL  3 mL Intravenous Q12H Latrelle Dodrill, MD      . sodium chloride 0.9 % injection 3 mL  3 mL Intravenous Q12H Latrelle Dodrill, MD   3 mL at 07/16/13 2126  . sodium chloride 0.9 % injection 3 mL  3 mL Intravenous PRN Latrelle Dodrill, MD      . ticagrelor [facility]) tablet 90 mg  90 mg Oral BID Latrelle Dodrill, MD   90 mg at 07/16/13 2125     Allergies  Allergen Reactions  . Morphine And Related Nausea And Vomiting     Past Medical History  Diagnosis Date  . Hypertension   . Depression   . Insomnia   . Gestational diabetes   . Allergy   . Anxiety   . Rectal abnormality   . Myocardial infarction 06/2013  . Coronary artery disease     Past Surgical History  Procedure Laterality Date  . Wrist surgery  1987    RIGHT; bone from forearm grafted to wrist  . Abdominal hysterectomy    . Tonsilectomy, adenoidectomy, bilateral myringotomy and tubes    . Colon surgery      Family History  Problem Relation Age of Onset  . Cancer Mother   . COPD Mother     emphysema  . Heart disease Father 58    s/p 3V CABG  . Depression Father   . Mental illness Sister     depression  . Depression Sister   . Mental illness Son     ADHD, bipolar disorder  . Diabetes Maternal Grandmother   . Heart disease Maternal Grandmother   . Diabetes Maternal Grandfather   . Cancer Maternal Grandfather     lung cancer  . Diabetes Paternal Grandmother   . Kidney disease Paternal Grandmother   . Diabetes Paternal Grandfather   . Heart disease Paternal Grandfather     Social History:  reports that she has quit smoking. Her smoking use  included Cigarettes. She started smoking about 30 years ago. She has a 40 pack-year smoking history. She has never used smokeless tobacco. She reports that she drinks about 14.4 ounces of alcohol per week. She reports that she does not use illicit drugs.   Review of Systems: Constitutional:  denies fever, chills,  diaphoresis, appetite change and fatigue.  HEENT: denies photophobia, eye pain, redness, hearing loss, ear pain, congestion, sore throat, rhinorrhea, sneezing, neck pain, neck stiffness and tinnitus.  Respiratory: denies SOB, DOE, cough, chest tightness, and wheezing.  Cardiovascular: admits to chest pain,  Denies palpitations and leg swelling.  Gastrointestinal: denies nausea, vomiting, abdominal pain, diarrhea, constipation, blood in stool.  Genitourinary: denies dysuria, urgency, frequency, hematuria, flank pain and difficulty urinating.  Musculoskeletal: denies  myalgias, back pain, joint swelling, arthralgias and gait problem.   Skin: denies pallor, rash and wound.  Neurological: denies dizziness, seizures, syncope, weakness, light-headedness, numbness and headaches.   Hematological: denies adenopathy, easy bruising, personal or family bleeding history.  Psychiatric/ Behavioral: denies suicidal ideation, mood changes, confusion, nervousness, sleep disturbance and agitation.    Physical Exam: BP 112/61  Pulse 70  Temp(Src) 98 F (36.7 C) (Oral)  Resp 16  Wt 132 lb (59.875 kg)  SpO2 98%  Wt Readings from Last 3 Encounters:  07/17/13 132 lb (59.875 kg)  07/16/13 133 lb 12.8 oz (60.691 kg)  07/01/13 134 lb 3.2 oz (60.873 kg)    General: Vital signs reviewed and noted. Well-developed, well-nourished, in no acute distress; alert,   Head: Normocephalic, atraumatic, sclera anicteric,   Neck: Supple. Negative for carotid bruits. No JVD   Lungs:  Clear bilaterally, no  wheezes, rales, or rhonchi. Breathing is normal   Heart: RRR with S1 S2. No murmurs, rubs, or gallops   Abdomen:  Soft, non-tender, non-distended with normoactive bowel sounds. No hepatomegaly. No rebound/guarding. No obvious abdominal masses   MSK: Strength and the appear normal for age.   Extremities: No clubbing or cyanosis. No edema.  Distal pedal pulses are 2+ and equal   Neurologic: Alert and oriented X 3. Moves  all extremities spontaneously.  Psych: Responds to questions appropriately with a normal affect.     Lab results: Basic Metabolic Panel:  Recent Labs Lab 07/16/13 1406  NA 138  K 3.9  CL 101  CO2 22  GLUCOSE 109*  BUN 6  CREATININE 0.72  CALCIUM 9.2    Liver Function Tests: No results found for this basename: AST, ALT, ALKPHOS, BILITOT, PROT, ALBUMIN,  in the last 168 hours No results found for this basename: LIPASE, AMYLASE,  in the last 168 hours No results found for this basename: AMMONIA,  in the last 168 hours  CBC:  Recent Labs Lab 07/16/13 1406  WBC 6.9  HGB 12.7  HCT 36.1  MCV 89.1  PLT 254    Cardiac Enzymes:  Recent Labs Lab 07/16/13 1406 07/16/13 1819 07/17/13 0047 07/17/13 0509  TROPONINI <0.30 <0.30 <0.30 <0.30    BNP: No components found with this basename: POCBNP,   CBG: No results found for this basename: GLUCAP,  in the last 168 hours  Coagulation Studies: No results found for this basename: LABPROT, INR,  in the last 72 hours   Other results: EKG :  NSR,  TWI in anterior lateral leads.  No significant changes from ECG 3 weeks ago.  Imaging: Dg Chest 2 View  07/16/2013   CLINICAL DATA:  Left-sided chest pain  EXAM: CHEST  2 VIEW  COMPARISON:  06/24/2013  FINDINGS: Normal mediastinum and cardiac silhouette. Normal pulmonary vasculature. No evidence of effusion, infiltrate, or pneumothorax. No acute bony abnormality.  IMPRESSION: No acute cardiopulmonary process.   Electronically Signed   By: Genevive Bi M.D.   On: 07/16/2013 14:49     Assessment & Plan:  1. CAD:  She has had recurrent CP - very similar to her previous angina although not as bad.  I think we should proceed with repeat cath.  She had cutting balloon only - not stented.   Discussed risks, benefits, options of cath. She understands and agrees to proceed.   2. smokinmg :  Stopped 23 day s ago  3. ETOH abuse:  She has been drinking lots since the death of her  mother and her son.  I 've advised her to restart her counseling.     Vesta Mixer, Montez Hageman., MD, [facility] 07/17/2013, 8:27 AM Office - [phone] Pager [phone]

## 2013-07-17 NOTE — Interval H&P Note (Signed)
History and Physical Interval Note:  07/17/2013 3:33 PM  Lynn Harvey  has presented today for cardiac cath with the diagnosis of cp  The various methods of treatment have been discussed with the patient and family. After consideration of risks, benefits and other options for treatment, the patient has consented to  Procedure(s): LEFT HEART CATHETERIZATION WITH CORONARY ANGIOGRAM (N/A) as a surgical intervention .  The patient's history has been reviewed, patient examined, no change in status, stable for surgery.  I have reviewed the patient's chart and labs.  Questions were answered to the patient's satisfaction.    Cath Lab Visit (complete for each Cath Lab visit)  Clinical Evaluation Leading to the Procedure:   ACS: no  Non-ACS:    Anginal Classification: CCS III  Anti-ischemic medical therapy: Minimal Therapy (1 class of medications)  Non-Invasive Test Results: No non-invasive testing performed  Prior CABG: No previous CABG        Kathleene Hazel

## 2013-07-17 NOTE — Progress Notes (Signed)
Family Medicine Teaching Service Attending Note  I interviewed and examined patient Lynn Harvey and reviewed their tests and x-rays.  I discussed with Dr. Waynetta Sandy and reviewed their note for today.  I agree with their assessment and plan.     Additionally  Proceed as per cardiology Offer alcohol cessation options

## 2013-07-17 NOTE — Progress Notes (Signed)
Pt heart rate went up to the 120-130's while ambulating in the hallway

## 2013-07-17 NOTE — Progress Notes (Signed)
Family Medicine Teaching Service Daily Progress Note Intern Pager: [phone]  Patient name: Lynn Harvey Medical record number: [id number] Date of birth: 07-06-1965 Age: 48 y.o. Gender: female  Primary Care Provider: JEFFERY,[name], PA-C Consultants: Cardiology Code Status: Full  Pt Overview and Major Events to Date:  5/29: cards consult--> cath  Assessment and Plan: Lynn Harvey is a 48 y.o. female presenting with chest pain x 1 day relieved by nitro. PMH is significant for recent NSTEMI beginning of May (85% stenosis distal LAD s/p successful balloon angiplasty), hyperlipidemia, depression, ADHD, prediabetes (A1c 5.7), tobacco and ethanol abuse.   # Chest pain: recent NSTEMI with cath on 06/24/13 s/p balloon angioplasty, discharged with ASA and brilinta. Repeat EKG this morning appears unchanged, troponins negative x 3. - continuous cardiac monitoring  - continue ASA, brilinta  - consult to cardiology, appreciate recs: likely repeat cath  # Hypertension: stable  - continue home meds: metoprolol 25mg  bid, losartan 25mg  qday   # HLD: on atorvastatin 40mg . Last lipid panel 06/24/13 LDL 108, HDL 39.  - continue home atorvastatin 40mg    # ADHD: stable  - hold home adderall 15mg  BID in setting of possible cardiac etiology   # Depression: stable  - continue home wellbutrin xl, xanax   # Tobacco abuse: 1.5 PPD x 41yrs, has stopped since last admission  - nicotine patch   # EtOH abuse: per last admission H&P would go through "2 cases of beer in a weekend with her husband". No evidence of withdrawal during last hospital course  - consider CIWA if patient starts showing any evidence of withdrawal   FEN/GI: HHD, saline lock, NPO at midnight pending cardiology recs  Prophylaxis: heparin sq  Disposition: pending clinical improvement  Subjective:  Feels okay this morning, no complaints other than being hungry. Had chest pain again last night that went away after an hour (took 3x nitro  and xanax).  Objective: Temp:  [97.7 F (36.5 C)-98.8 F (37.1 C)] 98 F (36.7 C) (05/29 0500) Pulse Rate:  [70-95] 70 (05/29 0500) Resp:  [11-20] 16 (05/29 0500) BP: (110-164)/(61-88) 112/61 mmHg (05/29 0500) SpO2:  [97 %-99 %] 98 % (05/29 0500) Weight:  [132 lb (59.875 kg)-133 lb 12.8 oz (60.691 kg)] 132 lb (59.875 kg) (05/29 0500) Physical Exam: General: NAD Cardiovascular: RRR, normal s1/s2, 1-2/6 systolic murmur Respiratory: mild bibasilar crackles, normal effort Abdomen: soft, nontender nondistended, normal bowel sounds Extremities: no edema or cyanosis, WWP. Neuro: alert and oriented, no focal deficits  Laboratory:  Recent Labs Lab 07/16/13 1406  WBC 6.9  HGB 12.7  HCT 36.1  PLT 254    Recent Labs Lab 07/16/13 1406  NA 138  K 3.9  CL 101  CO2 22  BUN 6  CREATININE 0.72  CALCIUM 9.2  GLUCOSE 109*   Troponin negative x 3  Imaging/Diagnostic Tests: Dg Chest 2 View  07/16/2013   CLINICAL DATA:  Left-sided chest pain  EXAM: CHEST  2 VIEW  COMPARISON:  06/24/2013  FINDINGS: Normal mediastinum and cardiac silhouette. Normal pulmonary vasculature. No evidence of effusion, infiltrate, or pneumothorax. No acute bony abnormality.  IMPRESSION: No acute cardiopulmonary process.   Electronically Signed   By: Genevive Bi M.D.   On: 07/16/2013 14:49    Tawni Carnes, MD 07/17/2013, 7:38 AM PGY-1, [facility] Family Medicine FPTS Intern pager: [phone], text pages welcome

## 2013-07-18 DIAGNOSIS — F988 Other specified behavioral and emotional disorders with onset usually occurring in childhood and adolescence: Secondary | ICD-10-CM

## 2013-07-18 NOTE — ED Provider Notes (Signed)
Medical screening examination/treatment/procedure(s) were performed by non-physician practitioner and as supervising physician I was immediately available for consultation/collaboration.   EKG Interpretation   Date/Time:  Thursday Jul 16 2013 13:46:50 EDT Ventricular Rate:  86 PR Interval:  159 QRS Duration: 76 QT Interval:  356 QTC Calculation: 426 R Axis:   77 Text Interpretation:  Sinus rhythm Nonspecific T abnormalities, diffuse  leads Confirmed by Juleen China  MD, Jazmyne Beauchesne ([id number]) on 07/16/2013 2:07:31 PM       Raeford Razor, MD 07/18/13 [phone]

## 2013-07-18 NOTE — Discharge Instructions (Addendum)
You were admitted for chest pain, and given the recent history of heart catheterization there was concern for reocclusion of your coronary arteries. The repeat cath showed the balloon angioplasty that was performed earlier in the month was still okay.  The cardiologist recommends that if you continue to have symptoms often that you start a medication called imdur, which is a 24 hour lasting nitro to help with "vasospasm", or constriction of the blood vessels. If you get these symptoms again, take the nitro tablets as you were already prescribed as directed (1 tab under the tongue every 5 minutes for 3 doses, if still not relieved call your doctor).  Continue taking the same medications you were taking before coming to the hospital.  Cardiac Diet This diet can help prevent heart disease and stroke. Many factors influence your heart health, including eating and exercise habits. Coronary risk rises a lot with abnormal blood fat (lipid) levels. Cardiac meal planning includes limiting unhealthy fats, increasing healthy fats, and making other small dietary changes. General guidelines are as follows:  Adjust calorie intake to reach and maintain desirable body weight.  Limit total fat intake to less than 30% of total calories. Saturated fat should be less than 7% of calories.  Saturated fats are found in animal products and in some vegetable products. Saturated vegetable fats are found in coconut oil, cocoa butter, palm oil, and palm kernel oil. Read labels carefully to avoid these products as much as possible. Use butter in moderation. Choose tub margarines and oils that have 2 grams of fat or less. Good cooking oils are canola and olive oils.  Practice low-fat cooking techniques. Do not fry food. Instead, broil, bake, boil, steam, grill, roast on a rack, stir-fry, or microwave it. Other fat reducing suggestions include:  Remove the skin from poultry.  Remove all visible fat from meats.  Skim the fat  off stews, soups, and gravies before serving them.  Steam vegetables in water or broth instead of sauting them in fat.  Avoid foods with trans fat (or hydrogenated oils), such as commercially fried foods and commercially baked goods. Commercial shortening and deep-frying fats will contain trans fat.  Increase intake of fruits, vegetables, whole grains, and legumes to replace foods high in fat.  Increase consumption of nuts, legumes, and seeds to at least 4 servings weekly. One serving of a legume equals  cup, and 1 serving of nuts or seeds equals  cup.  Choose whole grains more often. Have 3 servings per day (a serving is 1 ounce [oz]).  Eat 4 to 5 servings of vegetables per day. A serving of vegetables is 1 cup of raw leafy vegetables;  cup of raw or cooked cut-up vegetables;  cup of vegetable juice.  Eat 4 to 5 servings of fruit per day. A serving of fruit is 1 medium whole fruit;  cup of dried fruit;  cup of fresh, frozen, or canned fruit;  cup of 100% fruit juice.  Increase your intake of dietary fiber to 20 to 30 grams per day. Insoluble fiber may help lower your risk of heart disease and may help curb your appetite.  Soluble fiber binds cholesterol to be removed from the blood. Foods high in soluble fiber are dried beans, citrus fruits, oats, apples, bananas, broccoli, Brussels sprouts, and eggplant.  Try to include foods fortified with plant sterols or stanols, such as yogurt, breads, juices, or margarines. Choose several fortified foods to achieve a daily intake of 2 to 3 grams of  plant sterols or stanols.  Foods with omega-3 fats can help reduce your risk of heart disease. Aim to have a 3.5 oz portion of fatty fish twice per week, such as salmon, mackerel, albacore tuna, sardines, lake trout, or herring. If you wish to take a fish oil supplement, choose one that contains 1 gram of both DHA and EPA.  Limit processed meats to 2 servings (3 oz portion) weekly.  Limit the sodium  in your diet to 1500 milligrams (mg) per day. If you have high blood pressure, talk to a registered dietitian about a DASH (Dietary Approaches to Stop Hypertension) eating plan.  Limit sweets and beverages with added sugar, such as soda, to no more than 5 servings per week. One serving is:   1 tablespoon sugar.  1 tablespoon jelly or jam.   cup sorbet.  1 cup lemonade.   cup regular soda. CHOOSING FOODS Starches  Allowed: Breads: All kinds (wheat, rye, raisin, white, oatmeal, [place], [place], and English muffin bread). Low-fat rolls: English muffins, frankfurter and hamburger buns, bagels, pita bread, tortillas (not fried). Pancakes, waffles, biscuits, and muffins made with recommended oil.  Avoid: Products made with saturated or trans fats, oils, or whole milk products. Butter rolls, cheese breads, croissants. Commercial doughnuts, muffins, sweet rolls, biscuits, waffles, pancakes, store-bought mixes. Crackers  Allowed: Low-fat crackers and snacks: Animal, graham, rye, saltine (with recommended oil, no lard), oyster, and matzo crackers. Bread sticks, melba toast, rusks, flatbread, pretzels, and light popcorn.  Avoid: High-fat crackers: cheese crackers, butter crackers, and those made with coconut, palm oil, or trans fat (hydrogenated oils). Buttered popcorn. Cereals  Allowed: Hot or cold whole-grain cereals.  Avoid: Cereals containing coconut, hydrogenated vegetable fat, or animal fat. Potatoes / Pasta / Rice  Allowed: All kinds of potatoes, rice, and pasta (such as macaroni, spaghetti, and noodles).  Avoid: Pasta or rice prepared with cream sauce or high-fat cheese. Chow mein noodles, [place] fries. Vegetables  Allowed: All vegetables and vegetable juices.  Avoid: Fried vegetables. Vegetables in cream, butter, or high-fat cheese sauces. Limit coconut. Fruit in cream or custard. Protein  Allowed: Limit your intake of meat, seafood, and poultry to no more than 6 oz (cooked  weight) per day. All lean, well-trimmed beef, veal, pork, and lamb. All chicken and [place] without skin. All fish and shellfish. Wild game: wild duck, rabbit, pheasant, and venison. Egg whites or low-cholesterol egg substitutes may be used as desired. Meatless dishes: recipes with dried beans, peas, lentils, and tofu (soybean curd). Seeds and nuts: all seeds and most nuts.  Avoid: Prime grade and other heavily marbled and fatty meats, such as short ribs, spare ribs, rib eye roast or steak, frankfurters, sausage, bacon, and high-fat luncheon meats, mutton. Caviar. Commercially fried fish. Domestic duck, goose, venison sausage. Organ meats: liver, gizzard, heart, chitterlings, brains, kidney, sweetbreads. Dairy  Allowed: Low-fat cheeses: nonfat or low-fat cottage cheese (1% or 2% fat), cheeses made with part skim milk, such as mozzarella, farmers, string, or ricotta. (Cheeses should be labeled no more than 2 to 6 grams fat per oz.). Skim (or 1%) milk: liquid, powdered, or evaporated. Buttermilk made with low-fat milk. Drinks made with skim or low-fat milk or cocoa. Chocolate milk or cocoa made with skim or low-fat (1%) milk. Nonfat or low-fat yogurt.  Avoid: Whole milk cheeses, including colby, cheddar, mu[place], [address], [place], [place], [place], [address], Swiss, and blue. Creamed cottage cheese, cream cheese. Whole milk and whole milk products, including buttermilk or yogurt made from whole  milk, drinks made from whole milk. Condensed milk, evaporated whole milk, and 2% milk. Soups and Combination Foods  Allowed: Low-fat low-sodium soups: broth, dehydrated soups, homemade broth, soups with the fat removed, homemade cream soups made with skim or low-fat milk. Low-fat spaghetti, lasagna, chili, and Spanish rice if low-fat ingredients and low-fat cooking techniques are used.  Avoid: Cream soups made with whole milk, cream, or high-fat cheese. All other soups. Desserts and Sweets  Allowed: Sherbet,  fruit ices, gelatins, meringues, and angel food cake. Homemade desserts with recommended fats, oils, and milk products. Jam, jelly, honey, marmalade, sugars, and syrups. Pure sugar candy, such as gum drops, hard candy, jelly beans, marshmallows, mints, and small amounts of dark chocolate.  Avoid: Commercially prepared cakes, pies, cookies, frosting, pudding, or mixes for these products. Desserts containing whole milk products, chocolate, coconut, lard, palm oil, or palm kernel oil. Ice cream or ice cream drinks. Candy that contains chocolate, coconut, butter, hydrogenated fat, or unknown ingredients. Buttered syrups. Fats and Oils  Allowed: Vegetable oils: safflower, sunflower, corn, soybean, cottonseed, sesame, canola, olive, or peanut. Non-hydrogenated margarines. Salad dressing or mayonnaise: homemade or commercial, made with a recommended oil. Low or nonfat salad dressing or mayonnaise.  Limit added fats and oils to 6 to 8 tsp per day (includes fats used in cooking, baking, salads, and spreads on bread). Remember to count the "hidden fats" in foods.  Avoid: Solid fats and shortenings: butter, lard, salt pork, bacon drippings. Gravy containing meat fat, shortening, or suet. Cocoa butter, coconut. Coconut oil, palm oil, palm kernel oil, or hydrogenated oils: these ingredients are often used in bakery products, nondairy creamers, whipped toppings, candy, and commercially fried foods. Read labels carefully. Salad dressings made of unknown oils, sour cream, or cheese, such as blue cheese and Roquefort. Cream, all kinds: half-and-half, light, heavy, or whipping. Sour cream or cream cheese (even if "light" or low-fat). Nondairy cream substitutes: coffee creamers and sour cream substitutes made with palm, palm kernel, hydrogenated oils, or coconut oil. Beverages  Allowed: Coffee (regular or decaffeinated), tea. Diet carbonated beverages, mineral water. Alcohol: Check with your caregiver. Moderation is  recommended.  Avoid: Whole milk, regular sodas, and juice drinks with added sugar. Condiments  Allowed: All seasonings and condiments. Cocoa powder. "Cream" sauces made with recommended ingredients.  Avoid: Carob powder made with hydrogenated fats. SAMPLE MENU Breakfast   cup orange juice   cup oatmeal  1 slice toast  1 tsp margarine  1 cup skim milk Lunch  [place] sandwich with 2 oz [place], 2 slices bread  Lettuce and tomato slices  Fresh fruit  Carrot sticks  Coffee or tea Snack  Fresh fruit or low-fat crackers Dinner  3 oz lean ground beef  1 baked potato  1 tsp margarine   cup asparagus  Lettuce salad  1 tbs non-creamy dressing   cup peach slices  1 cup skim milk Document Released: 11/15/2007 Document Revised: 08/07/2011 Document Reviewed: 05/01/2011 Exit[employer] Patient Information 2014 [place], [place].  Radial Site Care Refer to this sheet in the next few weeks. These instructions provide you with information on caring for yourself after your procedure. Your caregiver may also give you more specific instructions. Your treatment has been planned according to current medical practices, but problems sometimes occur. Call your caregiver if you have any problems or questions after your procedure. HOME CARE INSTRUCTIONS  You may shower the day after the procedure.Remove the bandage (dressing) and gently wash the site with plain soap and water.Gently pat  the site dry.  Do not apply powder or lotion to the site.  Do not submerge the affected site in water for 3 to 5 days.  Inspect the site at least twice daily.  Do not flex or bend the affected arm for 24 hours.  No lifting over 5 pounds (2.3 kg) for 5 days after your procedure.  Do not drive home if you are discharged the same day of the procedure. Have someone else drive you.  You may drive 24 hours after the procedure unless otherwise instructed by your caregiver.  Do not operate machinery  or power tools for 24 hours.  A responsible adult should be with you for the first 24 hours after you arrive home. What to expect:  Any bruising will usually fade within 1 to 2 weeks.  Blood that collects in the tissue (hematoma) may be painful to the touch. It should usually decrease in size and tenderness within 1 to 2 weeks. SEEK IMMEDIATE MEDICAL CARE IF:  You have unusual pain at the radial site.  You have redness, warmth, swelling, or pain at the radial site.  You have drainage (other than a small amount of blood on the dressing).  You have chills.  You have a fever or persistent symptoms for more than 72 hours.  You have a fever and your symptoms suddenly get worse.  Your arm becomes pale, cool, tingly, or numb.  You have heavy bleeding from the site. Hold pressure on the site. Document Released: 03/10/2010 Document Revised: 04/30/2011 Document Reviewed: 03/10/2010 [facility] Patient Information 2014 [place], [place].

## 2013-07-18 NOTE — Progress Notes (Signed)
TR band removed from right radial without any bleeding or complications. Dressing applied and VSS.

## 2013-07-18 NOTE — Progress Notes (Signed)
Family Medicine Teaching Service Daily Progress Note Intern Pager: [phone]  Patient name: Lynn Harvey Medical record number: [id number] Date of birth: 02/07/1966 Age: 48 y.o. Gender: female  Primary Care Provider: JEFFERY,[name], PA-C Consultants: Cardiology Code Status: Full  Pt Overview and Major Events to Date:  5/29: cards consult--> cath  Assessment and Plan: Lynn Harvey is a 48 y.o. female presenting with chest pain x 1 day relieved by nitro. PMH is significant for recent NSTEMI beginning of May (85% stenosis distal LAD s/p successful balloon angiplasty), hyperlipidemia, depression, ADHD, prediabetes (A1c 5.7), tobacco and ethanol abuse.   # Chest pain: recent NSTEMI with cath on 06/24/13 s/p balloon angioplasty, discharged with ASA and brilinta. Repeat EKG this morning appears unchanged, troponins negative x 3. - continuous cardiac monitoring  - continue ASA, brilinta  - consult to cardiology, appreciate recs - left heart cath 5/29, single vessel CAD with patent angioplasty at mid LAD, normal LV function, possible coronary vasospasm, can d/c in the morning  # Hypertension: stable  - continue home meds: metoprolol 25mg  bid, losartan 25mg  qday   # HLD: on atorvastatin 40mg . Last lipid panel 06/24/13 LDL 108, HDL 39.  - continue home atorvastatin 40mg    # ADHD: stable  - hold home adderall 15mg  BID in setting of possible cardiac etiology   # Depression: stable  - continue home wellbutrin xl, xanax   # Tobacco abuse: 1.5 PPD x 35yrs, has stopped since last admission  - nicotine patch   # EtOH abuse: per last admission H&P would go through "2 cases of beer in a weekend with her husband". No evidence of withdrawal during last hospital course  - consider CIWA if patient starts showing any evidence of withdrawal   FEN/GI: HHD, saline lock Prophylaxis: heparin sq  Disposition: pending clinical improvement  Subjective:  Feels good this morning, no complaints of CP or  SOB. She feels ready to go home. She would like to hold off on starting the imdur until her echo and cardiology follow up in June.  Objective: Temp:  [97.8 F (36.6 C)-97.9 F (36.6 C)] 97.8 F (36.6 C) (05/30 0500) Pulse Rate:  [65-72] 66 (05/30 0500) Resp:  [16] 16 (05/30 0500) BP: (108-132)/(60-75) 120/66 mmHg (05/30 0500) SpO2:  [98 %-99 %] 98 % (05/30 0500) Weight:  [134 lb 1.6 oz (60.827 kg)] 134 lb 1.6 oz (60.827 kg) (05/30 0500) Physical Exam: General: NAD Cardiovascular: RRR, normal s1/s2, 1/6 systolic murmur Respiratory: mild bibasilar crackles, normal effort Abdomen: soft, nontender nondistended, normal bowel sounds Extremities: no edema or cyanosis, WWP. Neuro: alert and oriented, no focal deficits  Laboratory:  Recent Labs Lab 07/16/13 1406  WBC 6.9  HGB 12.7  HCT 36.1  PLT 254    Recent Labs Lab 07/16/13 1406  NA 138  K 3.9  CL 101  CO2 22  BUN 6  CREATININE 0.72  CALCIUM 9.2  GLUCOSE 109*   Troponin negative x 3  Imaging/Diagnostic Tests: Dg Chest 2 View  07/16/2013   CLINICAL DATA:  Left-sided chest pain  EXAM: CHEST  2 VIEW  COMPARISON:  06/24/2013  FINDINGS: Normal mediastinum and cardiac silhouette. Normal pulmonary vasculature. No evidence of effusion, infiltrate, or pneumothorax. No acute bony abnormality.  IMPRESSION: No acute cardiopulmonary process.   Electronically Signed   By: Genevive Bi M.D.   On: 07/16/2013 14:49    Tawni Carnes, MD 07/18/2013, 8:24 AM PGY-1, [facility] Family Medicine FPTS Intern pager: [phone], text pages welcome

## 2013-07-18 NOTE — Progress Notes (Signed)
Family Medicine Teaching Service Attending Note  I discussed patient Lynn Harvey  with Dr. Waynetta Sandy and reviewed their note for today.  I agree with their assessment and plan.

## 2013-07-18 NOTE — Progress Notes (Signed)
Patient Name: Lynn Harvey Date of Encounter: 07/18/2013     Active Problems:   Tobacco abuse   Chest pain   HTN (hypertension)   Hyperlipidemia    SUBJECTIVE  No further chest pain.  She is anxious to go home.  CURRENT MEDS . aspirin EC  81 mg Oral Daily  . atorvastatin  40 mg Oral q1800  . buPROPion  300 mg Oral [id number]  . folic acid  1 mg Oral Daily  . losartan  25 mg Oral Daily  . metoprolol tartrate  25 mg Oral BID  . multivitamin with minerals  1 tablet Oral Daily  . sodium chloride  3 mL Intravenous Q12H  . sodium chloride  3 mL Intravenous Q12H  . thiamine  100 mg Oral Daily   Or  . thiamine  100 mg Intravenous Daily  . ticagrelor  90 mg Oral BID    OBJECTIVE  Filed Vitals:   07/17/13 1941 07/17/13 2100 07/18/13 0500 07/18/13 0947  BP: 132/62 125/75 120/66 123/68  Pulse:  72 66 69  Temp:  97.9 F (36.6 C) 97.8 F (36.6 C)   TempSrc:      Resp:  16 16   Weight:   134 lb 1.6 oz (60.827 kg)   SpO2:  99% 98%    No intake or output data in the 24 hours ending 07/18/13 1150 Filed Weights   07/17/13 0500 07/18/13 0500  Weight: 132 lb (59.875 kg) 134 lb 1.6 oz (60.827 kg)    PHYSICAL EXAM  General: Pleasant, NAD. Neuro: Alert and oriented X 3. Moves all extremities spontaneously. Psych: Normal affect. HEENT:  Normal  Neck: Supple without bruits or JVD. Lungs:  Resp regular and unlabored, CTA. Heart: RRR no s3, s4, or murmurs. Abdomen: Soft, non-tender, non-distended, BS + x 4.  Extremities: No clubbing, cyanosis or edema. DP/PT/Radials 2+ and equal bilaterally.  Accessory Clinical Findings  CBC  Recent Labs  07/16/13 1406  WBC 6.9  HGB 12.7  HCT 36.1  MCV 89.1  PLT 254   Basic Metabolic Panel  Recent Labs  07/16/13 1406  NA 138  K 3.9  CL 101  CO2 22  GLUCOSE 109*  BUN 6  CREATININE 0.72  CALCIUM 9.2   Liver Function Tests No results found for this basename: AST, ALT, ALKPHOS, BILITOT, PROT, ALBUMIN,  in the last 72  hours No results found for this basename: LIPASE, AMYLASE,  in the last 72 hours Cardiac Enzymes  Recent Labs  07/16/13 1819 07/17/13 0047 07/17/13 0509  TROPONINI <0.30 <0.30 <0.30   BNP No components found with this basename: POCBNP,  D-Dimer No results found for this basename: DDIMER,  in the last 72 hours Hemoglobin A1C No results found for this basename: HGBA1C,  in the last 72 hours Fasting Lipid Panel No results found for this basename: CHOL, HDL, LDLCALC, TRIG, CHOLHDL, LDLDIRECT,  in the last 72 hours Thyroid Function Tests No results found for this basename: TSH, T4TOTAL, FREET3, T3FREE, THYROIDAB,  in the last 72 hours  TELE  Normal sinus rhythm  ECG  Normal sinus rhythm Low voltage QRS T wave abnormality, consider anterolateral ischemia Abnormal ECG  Radiology/Studies  Dg Chest 2 View  07/16/2013   CLINICAL DATA:  Left-sided chest pain  EXAM: CHEST  2 VIEW  COMPARISON:  06/24/2013  FINDINGS: Normal mediastinum and cardiac silhouette. Normal pulmonary vasculature. No evidence of effusion, infiltrate, or pneumothorax. No acute bony abnormality.  IMPRESSION: No acute cardiopulmonary process.  Electronically Signed   By: Genevive BiStewart  Edmunds M.D.   On: 07/16/2013 14:49   Dg Chest 2 View  06/24/2013   CLINICAL DATA:  Chest pain.  EXAM: CHEST  2 VIEW  COMPARISON:  DG CHEST 2V dated 04/12/2011  FINDINGS: Mediastinum a.m. hilar structures normal. Lungs are clear. No focal infiltrate. No pleural effusion or pneumothorax. Heart size normal.  IMPRESSION: No active cardiopulmonary disease.   Electronically Signed   By: Maisie Fushomas  Register   On: 06/24/2013 12:47    ASSESSMENT AND PLAN 1. Single vessel CAD with patent angioplasty site mid LAD  2. Normal LV function  3. Possible coronary vasospasm  Continue medical management of her coronary artery disease.  She is declining addition of long-acting nitrate at this time but will consider it after she has a followup office visit.   She already has a pre-existing cardiology followup visit with Dr. Nicki Guadalajarahomas Kelly which she will keep on June 18. Okay for discharge today on current medication.  She is on dual antiplatelet therapy.   Signed, Cassell Clementhomas Raiyah Speakman MD

## 2013-07-18 NOTE — Discharge Summary (Signed)
Family [facility] Discharge Summary  Patient name: Lynn Harvey Medical record number: [id number] Date of birth: 09/04/1965 Age: 48 y.o. Gender: female Date of Admission: 07/16/2013  Date of Discharge: 07/18/2013 Admitting Physician: Carney LivingMarshall L Chambliss, MD  Primary Care Provider: JEFFERY,[name], PA-C Consultants: Cardiology  Indication for Hospitalization: Chest pain  Discharge Diagnoses/Problem List:  Unstable angina, improved Cardiac cath, stable Recent NSTEMI with single vessel disease, s/p balloon angioplasty Hyperlipidemia Prediabetes (A1c 5.7) Depression/anxiety ADHD Tobacco and ethanol abuse  Disposition: home  Discharge Condition: stable, improved  Brief Hospital Course:  Lynn Harvey is a 48 y.o. female was admitted for chest pain with recent NSTEMI and balloon angioplasty on cardiac cath 4 weeks ago. On presentation to ED her chest pain had resolved with 3x nitro. Initial workup was negative with an EKG that showed improved anterior TWI compared to prior, negative i-stat troponin. She had one additional episode of pain at night that resolved after an hour and 3x nitro, xanax. Troponins cycled overnight and were negative. Evaluated by cardiology and taken to cath that day which found stable single vessel disease with no further intervention required; recommended medical management. She was monitored overnight and stable for discharge in the morning.  Issues for Follow Up:  1. Angina: cardiology recommending starting imdur, however patient decided to wait until her outpatient cardiology follow up to decide on starting 2. Tobacco/ethanol abuse: patient has been tobacco free since her first discharge in the beginning of May. She is still using alcohol.  Significant Procedures: cardiac cath  Significant Labs and Imaging:   Recent Labs Lab 07/16/13 1406  WBC 6.9  HGB 12.7  HCT 36.1  PLT 254    Recent Labs Lab 07/16/13 1406  NA 138  K  3.9  CL 101  CO2 22  GLUCOSE 109*  BUN 6  CREATININE 0.72  CALCIUM 9.2   Troponin neg x 3  Cardiac cath: Hemodynamic Findings:  Central aortic pressure: 110/55  Left ventricular pressure: 123/3/9  Angiographic Findings:  Left main: Short, no obstructive disease.  Left Anterior Descending Artery: Moderate caliber vessel that courses to the apex. Proximal calcification noted. The proximal vessel has a 20% stenosis. The mid vessel just beyond the diagonal branch has diffuse 20% stenosis (site of cutting balloon angioplasty). The remainder of the LAD has mild diffuse plaque disease. The diagonal branch is a small to moderate caliber vessel with ostial 30% stenosis.  Circumflex Artery: Moderate caliber vessel with two small to moderate caliber obtuse marginal branches. No obstructive disease.  Right Coronary Artery: Small to moderate caliber co-dominant vessel with no obstructive disease.  Left Ventricular Angiogram: LVEF=55-60%.  Impression:  1. Single vessel CAD with patent angioplasty site mid LAD  2. Normal LV function  3. Possible coronary vasospasm  Dg Chest 2 View  07/16/2013   CLINICAL DATA:  Left-sided chest pain  EXAM: CHEST  2 VIEW  COMPARISON:  06/24/2013  FINDINGS: Normal mediastinum and cardiac silhouette. Normal pulmonary vasculature. No evidence of effusion, infiltrate, or pneumothorax. No acute bony abnormality.  IMPRESSION: No acute cardiopulmonary process.   Electronically Signed   By: Genevive BiStewart  Edmunds M.D.   On: 07/16/2013 14:49   Results/Tests Pending at Time of Discharge: none  Discharge Medications:    Medication List         ALPRAZolam 0.5 MG tablet  Commonly known as:  XANAX  Take 1 tablet (0.5 mg total) by mouth 2 (two) times daily as needed for sleep or anxiety.  amphetamine-dextroamphetamine 15 MG tablet  Commonly known as:  ADDERALL  Take 0.5 tablets (7.5 mg total) by mouth 2 (two) times daily.     aspirin 81 MG EC tablet  Take 1 tablet (81 mg  total) by mouth daily.     atorvastatin 40 MG tablet  Commonly known as:  LIPITOR  Take 1 tablet (40 mg total) by mouth daily at 6 PM.     BRILINTA 90 MG Tabs tablet  Generic drug:  ticagrelor  Take 90 mg by mouth 2 (two) times daily.     buPROPion 150 MG 24 hr tablet  Commonly known as:  WELLBUTRIN XL  Take 2 tablets (300 mg total) by mouth every morning.     losartan 25 MG tablet  Commonly known as:  COZAAR  Take 1 tablet (25 mg total) by mouth daily.     metoprolol tartrate 25 MG tablet  Commonly known as:  LOPRESSOR  Take 1 tablet (25 mg total) by mouth 2 (two) times daily.     nitroGLYCERIN 0.4 MG SL tablet  Commonly known as:  NITROSTAT  Place 1 tablet (0.4 mg total) under the tongue every 5 (five) minutes as needed for chest pain.     NYQUIL PO  Take 5 mLs by mouth daily as needed (for cough/sore throat).     valACYclovir 500 MG tablet  Commonly known as:  VALTREX  Take 500 mg by mouth 3 (three) times daily as needed (for fever blister).        Discharge Instructions: Please refer to Patient Instructions section of EMR for full details.  Patient was counseled important signs and symptoms that should prompt return to medical care, changes in medications, dietary instructions, activity restrictions, and follow up appointments.   Follow-Up Appointments: Follow-up Information   Follow up with JEFFERY,[name], PA-C. Schedule an appointment as soon as possible for a visit in 1 week. (For hospital follow up)    Specialty:  Physician Assistant   Contact information:   [address] [place] [address] [phone]       Follow up with [name], MD. (as already scheduled For hospital follow up)    Specialty:  Cardiology   Contact information:   [address] [place] [address] [phone]       Tawni Carnes, MD 07/19/2013, 2:59 PM PGY-1, [facility]

## 2013-07-23 ENCOUNTER — Encounter: Payer: Self-pay | Admitting: Cardiovascular Disease

## 2013-07-24 ENCOUNTER — Encounter: Payer: Self-pay | Admitting: Cardiovascular Disease

## 2013-07-27 ENCOUNTER — Other Ambulatory Visit: Payer: Self-pay | Admitting: Physician Assistant

## 2013-07-27 ENCOUNTER — Other Ambulatory Visit: Payer: Self-pay | Admitting: Family Medicine

## 2013-07-28 ENCOUNTER — Telehealth: Payer: Self-pay | Admitting: Physician Assistant

## 2013-07-28 NOTE — Telephone Encounter (Signed)
Pt is not due until 6/15. Pt notified. She only requested it because she had a notification on MyChart. [name], I'm routing this to you so that you can RF it on 08/03/13 when you return. Thanks

## 2013-07-28 NOTE — Telephone Encounter (Signed)
Please call patient. She is not due for her Adderall yet. [name] can review this when she is back.

## 2013-07-29 ENCOUNTER — Ambulatory Visit (HOSPITAL_COMMUNITY)
Admission: RE | Admit: 2013-07-29 | Discharge: 2013-07-29 | Disposition: A | Discharge status: Home / Self Care | Payer: 59 | Source: Ambulatory Visit | Attending: Cardiovascular Disease | Admitting: Cardiovascular Disease

## 2013-07-29 DIAGNOSIS — I2510 Atherosclerotic heart disease of native coronary artery without angina pectoris: Secondary | ICD-10-CM

## 2013-07-29 DIAGNOSIS — I059 Rheumatic mitral valve disease, unspecified: Secondary | ICD-10-CM

## 2013-07-29 LAB — CBC
HCT: 35.4 % — ABNORMAL LOW (ref 36.0–46.0)
HEMOGLOBIN: 12.1 g/dL (ref 12.0–15.0)
MCH: 29.9 pg (ref 26.0–34.0)
MCHC: 34.2 g/dL (ref 30.0–36.0)
MCV: 87.4 fL (ref 78.0–100.0)
Platelets: 288 10*3/uL (ref 150–400)
RBC: 4.05 MIL/uL (ref 3.87–5.11)
RDW: 14.2 % (ref 11.5–15.5)
WBC: 7.7 10*3/uL (ref 4.0–10.5)

## 2013-07-29 LAB — COMPREHENSIVE METABOLIC PANEL
ALBUMIN: 4.2 g/dL (ref 3.5–5.2)
ALT: 26 U/L (ref 0–35)
AST: 22 U/L (ref 0–37)
Alkaline Phosphatase: 67 U/L (ref 39–117)
BUN: 8 mg/dL (ref 6–23)
CO2: 24 mEq/L (ref 19–32)
CREATININE: 0.77 mg/dL (ref 0.50–1.10)
Calcium: 9.1 mg/dL (ref 8.4–10.5)
Chloride: 104 mEq/L (ref 96–112)
GLUCOSE: 105 mg/dL — AB (ref 70–99)
Potassium: 4.5 mEq/L (ref 3.5–5.3)
Sodium: 137 mEq/L (ref 135–145)
Total Bilirubin: 0.3 mg/dL (ref 0.2–1.2)
Total Protein: 6.7 g/dL (ref 6.0–8.3)

## 2013-07-29 LAB — LIPID PANEL
Cholesterol: 119 mg/dL (ref 0–200)
HDL: 37 mg/dL — AB (ref >39)
LDL CALC: 58 mg/dL (ref 0–99)
TRIGLYCERIDES: 119 mg/dL (ref <150)
Total CHOL/HDL Ratio: 3.2 Ratio
VLDL: 24 mg/dL (ref 0–40)

## 2013-07-29 NOTE — Progress Notes (Signed)
2D Echo Performed 07/29/2013    Lanier Felty, RCS  

## 2013-08-03 ENCOUNTER — Telehealth: Payer: Self-pay

## 2013-08-03 MED ORDER — AMPHETAMINE-DEXTROAMPHETAMINE 15 MG PO TABS: 7.5000 mg | ORAL_TABLET | Freq: Two times a day (BID) | ORAL | Status: DC

## 2013-08-03 NOTE — Telephone Encounter (Signed)
Notified pt via MyChart Rx is ready.

## 2013-08-03 NOTE — Addendum Note (Signed)
Addended by: Fernande Bras on: 08/03/2013 03:59 PM   Modules accepted: Orders

## 2013-08-03 NOTE — Telephone Encounter (Signed)
rx printed. Meds ordered this encounter  Medications  . amphetamine-dextroamphetamine (ADDERALL) 15 MG tablet    Sig: Take 0.5 tablets (7.5 mg total) by mouth 2 (two) times daily.    Dispense:  30 tablet    Refill:  0    Order Specific Question:  Supervising Provider    Answer:  DOOLITTLE, ROBERT P [[id number]]

## 2013-08-03 NOTE — Telephone Encounter (Signed)
Message sent to pt by MyChart to notify ready.

## 2013-08-06 ENCOUNTER — Encounter: Payer: Self-pay | Admitting: Cardiovascular Disease

## 2013-08-06 ENCOUNTER — Ambulatory Visit (INDEPENDENT_AMBULATORY_CARE_PROVIDER_SITE_OTHER): Payer: 59 | Admitting: Cardiovascular Disease

## 2013-08-06 VITALS — BP 142/70 | HR 70 | Resp 14

## 2013-08-06 DIAGNOSIS — I10 Essential (primary) hypertension: Secondary | ICD-10-CM

## 2013-08-06 DIAGNOSIS — F988 Other specified behavioral and emotional disorders with onset usually occurring in childhood and adolescence: Secondary | ICD-10-CM

## 2013-08-06 DIAGNOSIS — E785 Hyperlipidemia, unspecified: Secondary | ICD-10-CM

## 2013-08-06 DIAGNOSIS — I214 Non-ST elevation (NSTEMI) myocardial infarction: Secondary | ICD-10-CM

## 2013-08-06 MED ORDER — LOSARTAN POTASSIUM 50 MG PO TABS: 50.0000 mg | ORAL_TABLET | Freq: Every day | ORAL | Status: DC

## 2013-08-06 NOTE — Patient Instructions (Signed)
Your physician has recommended you make the following change in your medication: your losartan has been increased to 50 mg daily. You can take (2) of your 25 mg tablets until finished before starting the new prescription.  Your physician recommends that you schedule a follow-up appointment in: 3 months. The office will call you with this appointment.

## 2013-08-06 NOTE — Progress Notes (Signed)
Patient ID: Lynn Harvey, female   DOB: 1965-11-05, 48 y.o.   MRN: [id number]     HPI: Lynn Harvey is a 48 y.o. female who presents to the office today for a follow up cardiology evaluation.  Lynn Harvey is a 48 year old female, who presented to [facility] on 06/24/2013 with new onset substernal chest tightness.  She had a 30 year history of tobacco use and had been smoking 2 packs per day.  She ruled in for non-ST segment elevation myocardial infarction.  Cardiac catheterization revealed a 30% stenosis in the proximal LAD prior to the takeoff of the septal perforating artery and diagonal vessel and just beyond this diagonal was 85% LAD stenosis extending up to the diagonal takeoff.  The left circumflex and RCA were normal.  Acute ejection fraction was 50% and there was evidence for moderate anterolateral hypocontractility.  She underwent successful cutting balloon into sculpt with excellent angiographic results.  Residual narrowing was 0%.  For this reason, the artery was not stented, so as not to jail the diagonal and land the proximal portion of a stent in the proximal LAD narrowing.  She was discharged day 2 post MI on a medical regimen consisting of low-dose ACE inhibitor with lisinopril 2.5 mg, very low-dose beta blocker therapy at 12.5 twice a day, in addition to aspirin, Proventil, Lipitor 40 mg.  She has a history of ADD and apparently her mother died 2 years ago and several days later her son committed suicide.  She also has been on Wellbutrin and takes Alderall.  Since hospital discharge, she denies any recurrent chest tightness.  She does note some mild shortness of breath.  She quit smoking the day of her heart attack.  I saw her in followup on 07/01/2013 at which time she was doing well.  She subsequently developed an episode of recurrent chest pain in a setting of significant anxiety on May 29.  Apparently, she was rehospitalized her primary care physician's office for repeat cardiac  catheterization was done by Dr. Gershon Mussel Harvey, which showed a patent cutting balloon PCI site without restenosis.  Subsequently, she has felt well.  She is now not smoked for 43 days.  She developed a cough secondary to lisinopril and was started on losartan at low dose 25 mg.  She presents for evaluation.  Past Medical History  Diagnosis Date  . Hypertension   . Depression   . Insomnia   . Gestational diabetes   . Allergy   . Anxiety   . Rectal abnormality   . Myocardial infarction 06/2013  . Coronary artery disease     Past Surgical History  Procedure Laterality Date  . Wrist surgery  1987    RIGHT; bone from forearm grafted to wrist  . Abdominal hysterectomy    . Tonsilectomy, adenoidectomy, bilateral myringotomy and tubes    . Colon surgery      Allergies  Allergen Reactions  . Morphine And Related Nausea And Vomiting    Current Outpatient Prescriptions  Medication Sig Dispense Refill  . ALPRAZolam (XANAX) 0.5 MG tablet Take 1 tablet (0.5 mg total) by mouth 2 (two) times daily as needed for sleep or anxiety.  60 tablet  0  . amphetamine-dextroamphetamine (ADDERALL) 15 MG tablet Take 0.5 tablets (7.5 mg total) by mouth 2 (two) times daily.  30 tablet  0  . ASPIR-LOW 81 MG EC tablet TAKE 1 TABLET BY MOUTH   DAILY  30 tablet  0  . atorvastatin (  LIPITOR) 40 MG tablet TAKE 1 TABLET BY MOUTH   DAILY AT 6.00 IN THE EVENING  30 tablet  0  . buPROPion (WELLBUTRIN XL) 150 MG 24 hr tablet Take 2 tablets (300 mg total) by mouth every morning.  60 tablet  5  . metoprolol tartrate (LOPRESSOR) 25 MG tablet Take 1 tablet (25 mg total) by mouth 2 (two) times daily.  60 tablet  6  . nitroGLYCERIN (NITROSTAT) 0.4 MG SL tablet Place 1 tablet (0.4 mg total) under the tongue every 5 (five) minutes as needed for chest pain.  10 tablet  12  . Pseudoeph-Doxylamine-DM-APAP (NYQUIL PO) Take 5 mLs by mouth daily as needed (for cough/sore throat).      . valACYclovir (VALTREX) 500 MG tablet Take 500 mg by  mouth 3 (three) times daily as needed (for fever blister).       Marland Kitchen losartan (COZAAR) 50 MG tablet Take 1 tablet (50 mg total) by mouth daily.  30 tablet  6   No current facility-administered medications for this visit.    History   Social History  . Marital Status: Married    Spouse Name: Lynn Harvey    Number of Children: 3  . Years of Education: 12   Occupational History  . CUSTOMER SERVICE [employer]   Social History Main Topics  . Smoking status: Former Smoker -- 2.00 packs/day for 20 years    Types: Cigarettes    Start date: 12/25/1982  . Smokeless tobacco: Never Used     Comment: quit 06/24/13  . Alcohol Use: 14.4 oz/week    24 Cans of beer per week     Comment: more since fall 2012  . Drug Use: No  . Sexual Activity: Yes    Partners: Male    Birth Control/ Protection: Surgical   Other Topics Concern  . Not on file   Social History Narrative   Lives with her husband, their son Lynn Harvey, and her daughter Lynn Harvey and Amber's son Lynn Harvey.   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  She quit smoking on 06/24/2013  Family History  Problem Relation Age of Onset  . Cancer Mother   . COPD Mother     emphysema  . Heart disease Father 85    s/p 3V CABG  . Depression Father   . Mental illness Sister     depression  . Depression Sister   . Mental illness Son     ADHD, bipolar disorder  . Diabetes Maternal Grandmother   . Heart disease Maternal Grandmother   . Diabetes Maternal Grandfather   . Cancer Maternal Grandfather     lung cancer  . Diabetes Paternal Grandmother   . Kidney disease Paternal Grandmother   . Diabetes Paternal Grandfather   . Heart disease Paternal Grandfather     ROS General: Negative; No fevers, chills, or night sweats HEENT: Negative; No changes in vision or hearing, sinus congestion, difficulty swallowing Pulmonary: Positive for mild shortness of breath No cough, wheezing, , hemoptysis Cardiovascular: Se  HPI; No ,  recurrent chest pain, presyncope, syncope, palpatations GI: Negative; No nausea, vomiting, diarrhea, or abdominal pain GU: Negative; No dysuria, hematuria, or difficulty voiding Musculoskeletal: Negative; no myalgias, joint pain, or weakness Hematologic: Negative; no easy bruising, bleeding Endocrine: Negative; no heat/cold intolerance; no diabetes, Neuro: Negative; no changes in balance, headaches Skin: Negative; No rashes or skin lesions Psychiatric: Positive for depression, and positive for attention deficit disorder; No behavioral problems,  Sleep:  Negative; No snoring,  daytime sleepiness, hypersomnolence, bruxism, restless legs, hypnogognic hallucinations. Other comprehensive 14 point system review is negative    Physical Exam Blood pressure 142/70; pulse 70, respirations 14 General: Alert, oriented, no distress.  Skin: normal turgor, no rashes, warm and dry; multiple tattoos HEENT: Normocephalic, atraumatic. Pupils equal round and reactive to light; sclera anicteric; extraocular muscles intact, No lid lag; Nose without nasal septal hypertrophy; Mouth/Parynx benign; Mallinpatti scale 2 Neck: No JVD, no carotid bruits; normal carotid upstroke Lungs: clear to ausculatation and percussion bilaterally; no wheezing or rales, normal inspiratory and expiratory effort Chest wall: without tenderness to palpitation Heart: PMI not displaced, RRR, s1 s2 normal, faint 1/6 systolic murmur, No diastolic murmur, no rubs, gallops, thrills, or heaves Abdomen: soft, nontender; no hepatosplenomehaly, BS+; abdominal aorta nontender and not dilated by palpation. Back: no CVA tenderness Pulses: 2+  Musculoskeletal: full range of motion, normal strength, no joint deformities Extremities: Pulses 2+, no clubbing cyanosis or edema, Homan's sign negative  Neurologic: grossly nonfocal; Cranial nerves grossly wnl Psychologic: Normal mood and affect  ECG (independently read by me) normal sinus rhythm at 69  beats per minute.  Nondiagnostic T changes in lead 3.  Prior 07/01/2013 ECG (independently read by me): Normal sinus rhythm at 77 beats per minute.  Mild T-wave inversion V3 through V6.  QTc interval of 390 ms  LABS:  BMET    Component Value Date/Time   NA 137 07/29/2013 0847   K 4.5 07/29/2013 0847   CL 104 07/29/2013 0847   CO2 24 07/29/2013 0847   GLUCOSE 105* 07/29/2013 0847   BUN 8 07/29/2013 0847   CREATININE 0.77 07/29/2013 0847   CREATININE 0.72 07/16/2013 1406   CALCIUM 9.1 07/29/2013 0847   GFRNONAA >90 07/16/2013 1406   GFRAA >90 07/16/2013 1406     Hepatic Function Panel     Component Value Date/Time   PROT 6.7 07/29/2013 0847   ALBUMIN 4.2 07/29/2013 0847   AST 22 07/29/2013 0847   ALT 26 07/29/2013 0847   ALKPHOS 67 07/29/2013 0847   BILITOT 0.3 07/29/2013 0847     CBC    Component Value Date/Time   WBC 7.7 07/29/2013 0847   WBC 7.8 08/15/2012 1645   RBC 4.05 07/29/2013 0847   RBC 4.37 08/15/2012 1645   HGB 12.1 07/29/2013 0847   HGB 13.6 08/15/2012 1645   HCT 35.4* 07/29/2013 0847   HCT 42.8 08/15/2012 1645   PLT 288 07/29/2013 0847   MCV 87.4 07/29/2013 0847   MCV 97.9* 08/15/2012 1645   MCH 29.9 07/29/2013 0847   MCH 31.1 08/15/2012 1645   MCHC 34.2 07/29/2013 0847   MCHC 31.8 08/15/2012 1645   RDW 14.2 07/29/2013 0847   LYMPHSABS 2.5 06/24/2013 0215   MONOABS 0.5 06/24/2013 0215   EOSABS 0.1 06/24/2013 0215   BASOSABS 0.0 06/24/2013 0215     BNP No results found for this basename: probnp    Lipid Panel     Component Value Date/Time   CHOL 119 07/29/2013 0847   TRIG 119 07/29/2013 0847   HDL 37* 07/29/2013 0847   CHOLHDL 3.2 07/29/2013 0847   VLDL 24 07/29/2013 0847   LDLCALC 58 07/29/2013 0847     RADIOLOGY: Dg Chest 2 View  06/24/2013   CLINICAL DATA:  Chest pain.  EXAM: CHEST  2 VIEW  COMPARISON:  DG CHEST 2V dated 04/12/2011  FINDINGS: Mediastinum a.m. hilar structures normal. Lungs are clear. No focal infiltrate. No pleural effusion or  pneumothorax. Heart size  normal.  IMPRESSION: No active cardiopulmonary disease.   Electronically Signed   By: Maisie Fushomas  Register   On: 06/24/2013 12:47      ASSESSMENT AND PLAN: Lynn Harvey is a 48 year old female with a greater than 30 year history of tobacco use, who developed a non-ST segment elevation myocardial infarction secondary to subtotal LAD stenosis.  She underwent cardiac catheterization on May 6 and underwent successful percutaneous coronary intervention with cutting balloon angiosculpt with a greater than 85% stenosis being reduced to 0%.  Because of 30% LAD disease proximal to the diagonal and with the excellent results obtained with the cutting balloon, stenting was not done so as not to jail the diagonal 1 and a stent in the proximal LAD stenosis.  She did quit smoking.  When I saw her initially following her hospitalization.  I added ACE inhibition with lisinopril.  She developed a cough secondary to this.  On May 29 she underwent repeat cardiac catheterization, which he developed some recurrent vague chest symptoms, but she felt she was pretty anxious.  Repeat catheterization did not demonstrate any restenosis at the site of intervention.  Her blood pressure today is elevated on low-dose losartan, and I am recommending further titration to 50 mg.  Recent blood work shows marked improvement.  Lipids total cholesterol 119, LDL 58, triglycerides 119, which are markedly improved from a total cholesterol 229 and LDL of 132 and triglycerides 263 one year ago.  She is tolerating atorvastatin 40 mg.  I encouraged her on her continued smoking cessation.  I did recommend increased activity and exercise as tolerates.  I will see her in 3 months for cardiology reevaluation.   Lennette Biharihomas A. Kelly, MD, [facility]  08/06/2013 7:25 PM

## 2013-08-11 ENCOUNTER — Other Ambulatory Visit: Payer: Self-pay | Admitting: Physician Assistant

## 2013-08-11 MED ORDER — ALPRAZOLAM 0.5 MG PO TABS: 0.5000 mg | ORAL_TABLET | Freq: Two times a day (BID) | ORAL | Status: DC | PRN

## 2013-08-12 NOTE — Telephone Encounter (Signed)
Pt notified by MyChart.

## 2013-08-12 NOTE — Telephone Encounter (Signed)
Faxed

## 2013-08-26 ENCOUNTER — Other Ambulatory Visit: Payer: Self-pay | Admitting: Physician Assistant

## 2013-08-27 ENCOUNTER — Telehealth: Payer: Self-pay | Admitting: *Deleted

## 2013-08-27 NOTE — Telephone Encounter (Signed)
Returned cardiac rehab prescription phase ll.

## 2013-09-07 ENCOUNTER — Ambulatory Visit: Payer: 59 | Admitting: Cardiovascular Disease

## 2013-09-16 ENCOUNTER — Other Ambulatory Visit: Payer: Self-pay | Admitting: Physician Assistant

## 2013-09-16 MED ORDER — ALPRAZOLAM 0.5 MG PO TABS: 0.5000 mg | ORAL_TABLET | Freq: Two times a day (BID) | ORAL | Status: DC | PRN

## 2013-09-16 MED ORDER — AMPHETAMINE-DEXTROAMPHETAMINE 15 MG PO TABS: 7.5000 mg | ORAL_TABLET | Freq: Two times a day (BID) | ORAL | Status: DC

## 2013-09-17 NOTE — Telephone Encounter (Signed)
Rxs in [place], pt notified on MyChart.

## 2013-10-12 ENCOUNTER — Encounter: Payer: Self-pay | Admitting: Physician Assistant

## 2013-10-12 DIAGNOSIS — D649 Anemia, unspecified: Secondary | ICD-10-CM

## 2013-10-17 ENCOUNTER — Other Ambulatory Visit: Payer: Self-pay | Admitting: Physician Assistant

## 2013-10-19 MED ORDER — AMPHETAMINE-DEXTROAMPHETAMINE 15 MG PO TABS: 7.5000 mg | ORAL_TABLET | Freq: Two times a day (BID) | ORAL | Status: DC

## 2013-10-19 MED ORDER — ALPRAZOLAM 0.5 MG PO TABS: 0.5000 mg | ORAL_TABLET | Freq: Two times a day (BID) | ORAL | Status: DC | PRN

## 2013-10-20 NOTE — Telephone Encounter (Signed)
Called in xanax Rx because it had not been signed. Adderall Rx was signed and pt p/up.

## 2013-10-27 ENCOUNTER — Other Ambulatory Visit: Payer: Self-pay | Admitting: Physician Assistant

## 2013-10-28 NOTE — Telephone Encounter (Signed)
Faxed

## 2013-11-05 ENCOUNTER — Other Ambulatory Visit: Payer: Self-pay | Admitting: Physician Assistant

## 2013-11-06 ENCOUNTER — Telehealth: Payer: Self-pay | Admitting: Physician Assistant

## 2013-11-06 MED ORDER — BUPROPION HCL ER (XL) 150 MG PO TB24: 300.0000 mg | ORAL_TABLET | Freq: Every day | ORAL | Status: DC

## 2013-11-06 NOTE — Telephone Encounter (Signed)
error 

## 2013-11-25 ENCOUNTER — Ambulatory Visit (INDEPENDENT_AMBULATORY_CARE_PROVIDER_SITE_OTHER): Payer: 59 | Admitting: Cardiovascular Disease

## 2013-11-25 ENCOUNTER — Encounter: Payer: Self-pay | Admitting: Cardiovascular Disease

## 2013-11-25 VITALS — BP 142/80 | HR 63 | Ht 63.0 in | Wt 138.2 lb

## 2013-11-25 DIAGNOSIS — R0789 Other chest pain: Secondary | ICD-10-CM

## 2013-11-25 DIAGNOSIS — I10 Essential (primary) hypertension: Secondary | ICD-10-CM

## 2013-11-25 DIAGNOSIS — F329 Major depressive disorder, single episode, unspecified: Secondary | ICD-10-CM

## 2013-11-25 DIAGNOSIS — I214 Non-ST elevation (NSTEMI) myocardial infarction: Secondary | ICD-10-CM

## 2013-11-25 DIAGNOSIS — F988 Other specified behavioral and emotional disorders with onset usually occurring in childhood and adolescence: Secondary | ICD-10-CM

## 2013-11-25 DIAGNOSIS — F909 Attention-deficit hyperactivity disorder, unspecified type: Secondary | ICD-10-CM

## 2013-11-25 DIAGNOSIS — E785 Hyperlipidemia, unspecified: Secondary | ICD-10-CM

## 2013-11-25 DIAGNOSIS — F32A Depression, unspecified: Secondary | ICD-10-CM

## 2013-11-25 MED ORDER — METOPROLOL TARTRATE 25 MG PO TABS: ORAL_TABLET | ORAL | Status: DC

## 2013-11-25 NOTE — Patient Instructions (Signed)
Your physician recommends that you schedule a follow-up appointment in: 6 Months  Your physician has requested that you have en exercise stress myoview. For further information please visit [email or web address] Please follow instruction sheet, as given. 6 Months  Your physician has recommended you make the following change in your medication: Increase Metoprolol to 50 mg in the morning and 25 mg at night

## 2013-11-25 NOTE — Progress Notes (Signed)
Patient ID: Lynn Harvey, female   DOB: 1965-06-03, 48 y.o.   MRN: [id number]     HPI: Lynn Harvey is a 48 y.o. female who presents to the office today for a 4 month follow up cardiology evaluation.  Lynn Harvey is a 48 year old female who presented to [facility] on 06/24/2013 with new onset substernal chest tightness.  She had a 30 year history of tobacco use and had been smoking 2 packs per day.  She ruled in for non-ST segment elevation myocardial infarction.  Cardiac catheterization revealed a 30% stenosis in the proximal LAD prior to the takeoff of the septal perforating artery and diagonal vessel and just beyond this diagonal was 85% LAD stenosis extending up to the diagonal takeoff.  The left circumflex and RCA were normal.  Acute ejection fraction was 50% and there was evidence for moderate anterolateral hypocontractility.  She underwent successful cutting balloon into Angiosculpt with excellent angiographic results.  Residual narrowing was 0%.  For this reason, the artery was not stented, so as not to jail the diagonal and land the proximal portion of a stent in the proximal LAD narrowing.  She was discharged day 2 post MI on a medical regimen consisting of low-dose ACE inhibitor with lisinopril 2.5 mg, very low-dose beta blocker therapy at 12.5 twice a day, in addition to aspirin, Proventil, Lipitor 40 mg.  She has a history of ADHD and apparently her mother died 2 years ago and several days later her son committed suicide.  She also has been on Wellbutrin and takes Alderall.  Since hospital discharge, she denies any recurrent chest tightness.  She does note some mild shortness of breath.  She quit smoking the day of her heart attack.  I saw her in followup on 07/01/2013 at which time she was doing well.  She subsequently developed an episode of recurrent chest pain in a setting of significant anxiety on May 29.  Apparently, she was rehospitalized her primary care physician's office for  repeat cardiac catheterization was done by Dr. Sanjuana Kava which showed a patent cutting balloon PCI site without restenosis.  Subsequently, she has felt well.  She has quit smoking.  She developed an ACE-induced cough secondary to lisinopril and has been able to tolerate losartan.  She denies recent chest pain.  She was started on atorvastatin for hyperlipidemia to her hospitalization.  She has noticed some mild shortness of breath with fast walking, but denies any significant chest tightness.  Past Medical History  Diagnosis Date  . Hypertension   . Depression   . Insomnia   . Gestational diabetes   . Allergy   . Anxiety   . Rectal abnormality   . Myocardial infarction 06/2013  . Coronary artery disease     Past Surgical History  Procedure Laterality Date  . Wrist surgery  1987    RIGHT; bone from forearm grafted to wrist  . Abdominal hysterectomy    . Tonsilectomy, adenoidectomy, bilateral myringotomy and tubes    . Colon surgery      Allergies  Allergen Reactions  . Morphine And Related Nausea And Vomiting    Current Outpatient Prescriptions  Medication Sig Dispense Refill  . ALPRAZolam (XANAX) 0.5 MG tablet TAKE ONE TABLET   BY MOUTH   TWICE A DAY   AS NEEDED   FOR SLEEP OR ANXIETY  60 tablet  0  . amphetamine-dextroamphetamine (ADDERALL) 15 MG tablet Take 0.5 tablets by mouth 2 (two) times daily.  30  tablet  0  . ASPIR-LOW 81 MG EC tablet TAKE 1 TABLET BY MOUTH   DAILY  30 tablet  0  . atorvastatin (LIPITOR) 40 MG tablet Take 1 tablet (40 mg total) by mouth daily. PATIENT NEEDS OFFICE VISIT FOR ADDITIONAL REFILLS  30 tablet  0  . buPROPion (WELLBUTRIN XL) 150 MG 24 hr tablet Take 2 tablets (300 mg total) by mouth daily. PATIENT NEEDS OFFICE VISIT FOR ADDITIONAL REFILLS  60 tablet  0  . losartan (COZAAR) 50 MG tablet Take 1 tablet (50 mg total) by mouth daily.  30 tablet  6  . metoprolol tartrate (LOPRESSOR) 25 MG tablet Take 1 tablet (25 mg total) by mouth 2 (two) times  daily.  60 tablet  6  . nitroGLYCERIN (NITROSTAT) 0.4 MG SL tablet Place 1 tablet (0.4 mg total) under the tongue every 5 (five) minutes as needed for chest pain.  10 tablet  12  . Pseudoeph-Doxylamine-DM-APAP (NYQUIL PO) Take 5 mLs by mouth daily as needed (for cough/sore throat).      . valACYclovir (VALTREX) 500 MG tablet Take 500 mg by mouth 3 (three) times daily as needed (for fever blister).        No current facility-administered medications for this visit.    History   Social History  . Marital Status: Married    Spouse Name: Lynn Harvey    Number of Children: 3  . Years of Education: 12   Occupational History  . CUSTOMER SERVICE [employer]   Social History Main Topics  . Smoking status: Former Smoker -- 2.00 packs/day for 20 years    Types: Cigarettes    Start date: 12/25/1982  . Smokeless tobacco: Never Used     Comment: quit 06/24/13  . Alcohol Use: 14.4 oz/week    24 Cans of beer per week     Comment: more since fall 2012  . Drug Use: No  . Sexual Activity: Yes    Partners: Male    Birth Control/ Protection: Surgical   Other Topics Concern  . Not on file   Social History Narrative   Lives with her husband, their son Lynn Harvey, and her daughter Lynn Harvey and Lynn Harvey's son Lynn Harvey.   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  She quit smoking on 06/24/2013  Family History  Problem Relation Age of Onset  . Cancer Mother   . COPD Mother     emphysema  . Heart disease Father 45    s/p 3V CABG  . Depression Father   . Mental illness Sister     depression  . Depression Sister   . Mental illness Son     ADHD, bipolar disorder  . Diabetes Maternal Grandmother   . Heart disease Maternal Grandmother   . Diabetes Maternal Grandfather   . Cancer Maternal Grandfather     lung cancer  . Diabetes Paternal Grandmother   . Kidney disease Paternal Grandmother   . Diabetes Paternal Grandfather   . Heart disease Paternal Grandfather      ROS General: Negative; No fevers, chills, or night sweats HEENT: Negative; No changes in vision or hearing, sinus congestion, difficulty swallowing Pulmonary: Positive for mild shortness of breath No cough, wheezing, , hemoptysis Cardiovascular: Se  HPI; No , recurrent chest pain, presyncope, syncope, palpatations GI: Negative; No nausea, vomiting, diarrhea, or abdominal pain GU: Negative; No dysuria, hematuria, or difficulty voiding Musculoskeletal: Negative; no myalgias, joint pain, or weakness Hematologic: Negative; no easy bruising, bleeding Endocrine:  Negative; no heat/cold intolerance; no diabetes, Neuro: Negative; no changes in balance, headaches Skin: Negative; No rashes or skin lesions Psychiatric: Positive for depression, and positive for attention deficit disorder; No behavioral problems,  Sleep: Negative; No snoring,  daytime sleepiness, hypersomnolence, bruxism, restless legs, hypnogognic hallucinations. Other comprehensive 14 point system review is negative    Physical Exam Blood pressure 142/70; pulse 70, respirations 14 General: Alert, oriented, no distress.  Skin: normal turgor, no rashes, warm and dry; multiple tattoos HEENT: Normocephalic, atraumatic. Pupils equal round and reactive to light; sclera anicteric; extraocular muscles intact, No lid lag; Nose without nasal septal hypertrophy; Mouth/Parynx benign; Mallinpatti scale 2 Neck: No JVD, no carotid bruits; normal carotid upstroke Lungs: clear to ausculatation and percussion bilaterally; no wheezing or rales, normal inspiratory and expiratory effort Chest wall: without tenderness to palpitation Heart: PMI not displaced, RRR, s1 s2 normal, faint 1/6 systolic murmur, No diastolic murmur, no rubs, gallops, thrills, or heaves Abdomen: soft, nontender; no hepatosplenomehaly, BS+; abdominal aorta nontender and not dilated by palpation. Back: no CVA tenderness Pulses: 2+  Musculoskeletal: full range of motion,  normal strength, no joint deformities Extremities: Pulses 2+, no clubbing cyanosis or edema, Homan's sign negative  Neurologic: grossly nonfocal; Cranial nerves grossly wnl Psychologic: Normal mood and affect  ECG (independently read by me): Normal sinus rhythm at 63.  Normal intervals.  Nondiagnostic T change in 3 and V3-V6  08/06/2013 ECG (independently read by me) normal sinus rhythm at 69 beats per minute.  Nondiagnostic T changes in lead 3.  Prior 07/01/2013 ECG (independently read by me): Normal sinus rhythm at 77 beats per minute.  Mild T-wave inversion V3 through V6.  QTc interval of 390 ms  LABS:  BMET    Component Value Date/Time   NA 137 07/29/2013 0847   K 4.5 07/29/2013 0847   CL 104 07/29/2013 0847   CO2 24 07/29/2013 0847   GLUCOSE 105* 07/29/2013 0847   BUN 8 07/29/2013 0847   CREATININE 0.77 07/29/2013 0847   CREATININE 0.72 07/16/2013 1406   CALCIUM 9.1 07/29/2013 0847   GFRNONAA >90 07/16/2013 1406   GFRAA >90 07/16/2013 1406     Hepatic Function Panel     Component Value Date/Time   PROT 6.7 07/29/2013 0847   ALBUMIN 4.2 07/29/2013 0847   AST 22 07/29/2013 0847   ALT 26 07/29/2013 0847   ALKPHOS 67 07/29/2013 0847   BILITOT 0.3 07/29/2013 0847     CBC    Component Value Date/Time   WBC 7.7 07/29/2013 0847   WBC 7.8 08/15/2012 1645   RBC 4.05 07/29/2013 0847   RBC 4.37 08/15/2012 1645   HGB 12.1 07/29/2013 0847   HGB 13.6 08/15/2012 1645   HCT 35.4* 07/29/2013 0847   HCT 42.8 08/15/2012 1645   PLT 288 07/29/2013 0847   MCV 87.4 07/29/2013 0847   MCV 97.9* 08/15/2012 1645   MCH 29.9 07/29/2013 0847   MCH 31.1 08/15/2012 1645   MCHC 34.2 07/29/2013 0847   MCHC 31.8 08/15/2012 1645   RDW 14.2 07/29/2013 0847   LYMPHSABS 2.5 06/24/2013 0215   MONOABS 0.5 06/24/2013 0215   EOSABS 0.1 06/24/2013 0215   BASOSABS 0.0 06/24/2013 0215     BNP No results found for this basename: probnp    Lipid Panel     Component Value Date/Time   CHOL 119 07/29/2013 0847   TRIG 119  07/29/2013 0847   HDL 37* 07/29/2013 0847   CHOLHDL 3.2 07/29/2013 0847  VLDL 24 07/29/2013 0847   LDLCALC 58 07/29/2013 0847     RADIOLOGY: Dg Chest 2 View  06/24/2013   CLINICAL DATA:  Chest pain.  EXAM: CHEST  2 VIEW  COMPARISON:  DG CHEST 2V dated 04/12/2011  FINDINGS: Mediastinum a.m. hilar structures normal. Lungs are clear. No focal infiltrate. No pleural effusion or pneumothorax. Heart size normal.  IMPRESSION: No active cardiopulmonary disease.   Electronically Signed   By: Maisie Fus  Register   On: 06/24/2013 12:47      ASSESSMENT AND PLAN: [name] is a 48 year old female with a greater than 30 year history of tobacco use, who developed a non-ST segment elevation myocardial infarction secondary to subtotal LAD stenosis.  She underwent cardiac catheterization on Jun 24, 2013 and underwent successful percutaneous coronary intervention with cutting balloon angiosculpt with a greater than 85% stenosis being reduced to 0%.  Because of 30% LAD disease proximal to the diagonal and with the excellent results obtained with the cutting balloon, stenting was not done so as not to jail the diagonal 1 and a stent in the proximal LAD stenosis.  She did quit smoking.  She was started on ACE inhibition with lisinopril posthospitalization, but do to cough was switched to an ARB therapy and has tolerated losartan.  Her blood pressure today was 142/80 when checked by the nurse and her repeat by me was 138/80.  She does note shortness of breath with fast walking.  She is on metoprolol 25 mg twice a day, and I recommended further titration to 50 mg in the morning and 25 mg at night.  She's not having palpitations.  She is unaware of arrhythmia.  Since she did not have a stent placed in her LAD, I am recommending that she undergo a one-year followup exercise Myoview study prior to her next office visit in 6 months.  If she develops any change in symptoms, she will contact our office, and I will see her sooner.   Her lipid studies are excellent with an LDL cholesterol of 58 on her current dose of atorvastatin 40 mg and this was renewed.   Lynn Bihari, MD, [facility]  11/25/2013 12:23 PM

## 2013-11-26 ENCOUNTER — Encounter: Payer: Self-pay | Admitting: Cardiovascular Disease

## 2013-12-03 ENCOUNTER — Telehealth: Payer: Self-pay

## 2013-12-03 NOTE — Telephone Encounter (Signed)
Patient is requesting refills on adderall, xanax, and wellbutrin. Please return call at [phone]

## 2013-12-04 MED ORDER — ALPRAZOLAM 0.5 MG PO TABS: ORAL_TABLET | ORAL | Status: DC

## 2013-12-04 MED ORDER — BUPROPION HCL ER (XL) 150 MG PO TB24: 300.0000 mg | ORAL_TABLET | Freq: Every day | ORAL | Status: DC

## 2013-12-04 MED ORDER — AMPHETAMINE-DEXTROAMPHETAMINE 15 MG PO TABS: 7.5000 mg | ORAL_TABLET | Freq: Two times a day (BID) | ORAL | Status: DC

## 2013-12-04 NOTE — Telephone Encounter (Signed)
Pt called again to request refills on adderall, xanax, and welbutrin.

## 2013-12-04 NOTE — Telephone Encounter (Signed)
Rxs printed/sent. Patient needs OV with me for next fill.

## 2013-12-04 NOTE — Telephone Encounter (Signed)
Pt notified that rx's are up front for p/u and that she needs f/u

## 2013-12-06 ENCOUNTER — Other Ambulatory Visit: Payer: Self-pay | Admitting: Physician Assistant

## 2013-12-08 MED ORDER — ATORVASTATIN CALCIUM 40 MG PO TABS: 40.0000 mg | ORAL_TABLET | Freq: Every day | ORAL | Status: DC

## 2013-12-17 ENCOUNTER — Observation Stay (HOSPITAL_COMMUNITY)
Admission: EM | Admit: 2013-12-17 | Discharge: 2013-12-18 | Disposition: A | Discharge status: Home / Self Care | Payer: 59 | Attending: Cardiovascular Disease | Admitting: Cardiovascular Disease

## 2013-12-17 ENCOUNTER — Emergency Department (HOSPITAL_COMMUNITY): Payer: 59

## 2013-12-17 ENCOUNTER — Encounter (HOSPITAL_COMMUNITY): Payer: Self-pay | Admitting: Emergency Medicine

## 2013-12-17 DIAGNOSIS — I252 Old myocardial infarction: Secondary | ICD-10-CM | POA: Insufficient documentation

## 2013-12-17 DIAGNOSIS — Z955 Presence of coronary angioplasty implant and graft: Secondary | ICD-10-CM | POA: Diagnosis not present

## 2013-12-17 DIAGNOSIS — I2510 Atherosclerotic heart disease of native coronary artery without angina pectoris: Secondary | ICD-10-CM | POA: Insufficient documentation

## 2013-12-17 DIAGNOSIS — K921 Melena: Secondary | ICD-10-CM | POA: Diagnosis not present

## 2013-12-17 DIAGNOSIS — J309 Allergic rhinitis, unspecified: Secondary | ICD-10-CM | POA: Diagnosis not present

## 2013-12-17 DIAGNOSIS — I25119 Atherosclerotic heart disease of native coronary artery with unspecified angina pectoris: Secondary | ICD-10-CM

## 2013-12-17 DIAGNOSIS — D649 Anemia, unspecified: Secondary | ICD-10-CM | POA: Diagnosis not present

## 2013-12-17 DIAGNOSIS — E7800 Pure hypercholesterolemia, unspecified: Secondary | ICD-10-CM | POA: Diagnosis present

## 2013-12-17 DIAGNOSIS — R079 Chest pain, unspecified: Principal | ICD-10-CM | POA: Insufficient documentation

## 2013-12-17 DIAGNOSIS — Z9861 Coronary angioplasty status: Secondary | ICD-10-CM

## 2013-12-17 DIAGNOSIS — E785 Hyperlipidemia, unspecified: Secondary | ICD-10-CM | POA: Insufficient documentation

## 2013-12-17 DIAGNOSIS — Z87891 Personal history of nicotine dependence: Secondary | ICD-10-CM | POA: Diagnosis not present

## 2013-12-17 DIAGNOSIS — Z720 Tobacco use: Secondary | ICD-10-CM | POA: Diagnosis present

## 2013-12-17 DIAGNOSIS — K625 Hemorrhage of anus and rectum: Secondary | ICD-10-CM

## 2013-12-17 DIAGNOSIS — F32A Depression, unspecified: Secondary | ICD-10-CM | POA: Diagnosis present

## 2013-12-17 DIAGNOSIS — I10 Essential (primary) hypertension: Secondary | ICD-10-CM | POA: Diagnosis present

## 2013-12-17 DIAGNOSIS — F329 Major depressive disorder, single episode, unspecified: Secondary | ICD-10-CM | POA: Diagnosis present

## 2013-12-17 LAB — COMPREHENSIVE METABOLIC PANEL
ALT: 32 U/L (ref 0–35)
AST: 28 U/L (ref 0–37)
Albumin: 4.1 g/dL (ref 3.5–5.2)
Alkaline Phosphatase: 90 U/L (ref 39–117)
Anion gap: 12 (ref 5–15)
BUN: 9 mg/dL (ref 6–23)
CALCIUM: 10.1 mg/dL (ref 8.4–10.5)
CO2: 27 mEq/L (ref 19–32)
Chloride: 103 mEq/L (ref 96–112)
Creatinine, Ser: 0.87 mg/dL (ref 0.50–1.10)
GFR, EST NON AFRICAN AMERICAN: 79 mL/min — AB (ref >90)
GLUCOSE: 106 mg/dL — AB (ref 70–99)
Potassium: 4.1 mEq/L (ref 3.7–5.3)
Sodium: 142 mEq/L (ref 137–147)
TOTAL PROTEIN: 7.6 g/dL (ref 6.0–8.3)
Total Bilirubin: 0.4 mg/dL (ref 0.3–1.2)

## 2013-12-17 LAB — CBC WITH DIFFERENTIAL/PLATELET
BASOS PCT: 1 % (ref 0–1)
Basophils Absolute: 0 10*3/uL (ref 0.0–0.1)
Basophils Absolute: 0 10*3/uL (ref 0.0–0.1)
Basophils Relative: 1 % (ref 0–1)
EOS ABS: 0.1 10*3/uL (ref 0.0–0.7)
EOS PCT: 2 % (ref 0–5)
Eosinophils Absolute: 0.1 10*3/uL (ref 0.0–0.7)
Eosinophils Relative: 2 % (ref 0–5)
HCT: 31.1 % — ABNORMAL LOW (ref 36.0–46.0)
HEMATOCRIT: 31.3 % — AB (ref 36.0–46.0)
HEMOGLOBIN: 9.5 g/dL — AB (ref 12.0–15.0)
Hemoglobin: 9.6 g/dL — ABNORMAL LOW (ref 12.0–15.0)
LYMPHS ABS: 2.6 10*3/uL (ref 0.7–4.0)
Lymphocytes Relative: 36 % (ref 12–46)
Lymphocytes Relative: 41 % (ref 12–46)
Lymphs Abs: 2.1 10*3/uL (ref 0.7–4.0)
MCH: 23.9 pg — AB (ref 26.0–34.0)
MCH: 23.4 pg — AB (ref 26.0–34.0)
MCHC: 30.5 g/dL (ref 30.0–36.0)
MCHC: 30.7 g/dL (ref 30.0–36.0)
MCV: 78.3 fL (ref 78.0–100.0)
MCV: 76.3 fL — ABNORMAL LOW (ref 78.0–100.0)
MONO ABS: 0.3 10*3/uL (ref 0.1–1.0)
MONO ABS: 0.4 10*3/uL (ref 0.1–1.0)
Monocytes Relative: 5 % (ref 3–12)
Monocytes Relative: 7 % (ref 3–12)
Neutro Abs: 3.2 10*3/uL (ref 1.7–7.7)
Neutro Abs: 3.1 10*3/uL (ref 1.7–7.7)
Neutrophils Relative %: 56 % (ref 43–77)
Neutrophils Relative %: 49 % (ref 43–77)
PLATELETS: 292 10*3/uL (ref 150–400)
Platelets: 273 10*3/uL (ref 150–400)
RBC: 3.97 MIL/uL (ref 3.87–5.11)
RBC: 4.1 MIL/uL (ref 3.87–5.11)
RDW: 15.3 % (ref 11.5–15.5)
RDW: 15.3 % (ref 11.5–15.5)
WBC: 5.7 10*3/uL (ref 4.0–10.5)
WBC: 6.2 10*3/uL (ref 4.0–10.5)

## 2013-12-17 LAB — BASIC METABOLIC PANEL
Anion gap: 13 (ref 5–15)
BUN: 10 mg/dL (ref 6–23)
CO2: 25 mEq/L (ref 19–32)
CREATININE: 0.84 mg/dL (ref 0.50–1.10)
Calcium: 10.2 mg/dL (ref 8.4–10.5)
Chloride: 99 mEq/L (ref 96–112)
GFR calc non Af Amer: 83 mL/min — ABNORMAL LOW (ref >90)
GLUCOSE: 98 mg/dL (ref 70–99)
Potassium: 4.3 mEq/L (ref 3.7–5.3)
Sodium: 137 mEq/L (ref 137–147)

## 2013-12-17 LAB — PROTIME-INR
INR: 1.1 (ref 0.00–1.49)
PROTHROMBIN TIME: 14.3 s (ref 11.6–15.2)

## 2013-12-17 LAB — POC OCCULT BLOOD, ED: Fecal Occult Bld: POSITIVE — AB

## 2013-12-17 LAB — I-STAT TROPONIN, ED
TROPONIN I, POC: 0.01 ng/mL (ref 0.00–0.08)
Troponin i, poc: 0.01 ng/mL (ref 0.00–0.08)

## 2013-12-17 LAB — IRON AND TIBC
IRON: 68 ug/dL (ref 42–135)
Saturation Ratios: 14 % — ABNORMAL LOW (ref 20–55)
TIBC: 493 ug/dL — AB (ref 250–470)
UIBC: 425 ug/dL — AB (ref 125–400)

## 2013-12-17 LAB — MAGNESIUM: Magnesium: 2 mg/dL (ref 1.5–2.5)

## 2013-12-17 LAB — TROPONIN I

## 2013-12-17 LAB — TSH: TSH: 1.31 u[IU]/mL (ref 0.350–4.500)

## 2013-12-17 LAB — APTT: aPTT: 28 seconds (ref 24–37)

## 2013-12-17 MED ORDER — ATORVASTATIN CALCIUM 40 MG PO TABS
40.0000 mg | ORAL_TABLET | Freq: Every day | ORAL | Status: DC
  Administered 2013-12-17: 40 mg via ORAL
  Filled 2013-12-17 (×2): qty 1

## 2013-12-17 MED ORDER — PANTOPRAZOLE SODIUM 40 MG PO TBEC
40.0000 mg | DELAYED_RELEASE_TABLET | Freq: Every day | ORAL | Status: DC
  Administered 2013-12-18: 40 mg via ORAL
  Filled 2013-12-17: qty 1

## 2013-12-17 MED ORDER — ASPIRIN 300 MG RE SUPP
300.0000 mg | RECTAL | Status: DC
  Filled 2013-12-17: qty 1

## 2013-12-17 MED ORDER — METOPROLOL TARTRATE 50 MG PO TABS
50.0000 mg | ORAL_TABLET | Freq: Two times a day (BID) | ORAL | Status: DC
  Administered 2013-12-17: 50 mg via ORAL
  Filled 2013-12-17 (×3): qty 1

## 2013-12-17 MED ORDER — NITROGLYCERIN 0.4 MG SL SUBL: 0.4000 mg | SUBLINGUAL_TABLET | SUBLINGUAL | Status: DC | PRN

## 2013-12-17 MED ORDER — BUPROPION HCL ER (XL) 300 MG PO TB24
300.0000 mg | ORAL_TABLET | Freq: Every day | ORAL | Status: DC
  Administered 2013-12-18: 300 mg via ORAL
  Filled 2013-12-17: qty 1

## 2013-12-17 MED ORDER — LOSARTAN POTASSIUM 50 MG PO TABS
50.0000 mg | ORAL_TABLET | Freq: Every day | ORAL | Status: DC
  Administered 2013-12-17 – 2013-12-18 (×2): 50 mg via ORAL
  Filled 2013-12-17 (×2): qty 1

## 2013-12-17 MED ORDER — ASPIRIN 81 MG PO CHEW
324.0000 mg | CHEWABLE_TABLET | ORAL | Status: DC
  Filled 2013-12-17: qty 4

## 2013-12-17 MED ORDER — ASPIRIN EC 81 MG PO TBEC
81.0000 mg | DELAYED_RELEASE_TABLET | Freq: Every day | ORAL | Status: DC
  Administered 2013-12-18: 81 mg via ORAL
  Filled 2013-12-17: qty 1

## 2013-12-17 MED ORDER — ASPIRIN EC 81 MG PO TBEC: 81.0000 mg | DELAYED_RELEASE_TABLET | Freq: Every day | ORAL | Status: DC

## 2013-12-17 MED ORDER — ALPRAZOLAM 0.5 MG PO TABS
0.5000 mg | ORAL_TABLET | Freq: Two times a day (BID) | ORAL | Status: DC | PRN
  Administered 2013-12-17: 0.5 mg via ORAL
  Filled 2013-12-17: qty 1

## 2013-12-17 MED ORDER — ACETAMINOPHEN 325 MG PO TABS: 650.0000 mg | ORAL_TABLET | ORAL | Status: DC | PRN

## 2013-12-17 MED ORDER — ONDANSETRON HCL 4 MG/2ML IJ SOLN: 4.0000 mg | Freq: Four times a day (QID) | INTRAMUSCULAR | Status: DC | PRN

## 2013-12-17 NOTE — ED Provider Notes (Signed)
The patient is a 48 year old female with recurrent chest pain, has had cardiac disease in the past, states this pain feels similar. On exam she has clear heart and lung sounds, normal respirations, no peripheral edema. EKG is nonischemic but the patient is higher risk for myocardial infarction, anticipate admission for cardiac rule out. Doubt other source of pain including lower likelihood of dissection or pulmonary embolism.  Cardiology will be consulted  ED ECG REPORT  I personally interpreted this EKG   Date: 12/18/2013   Rate: 56  Rhythm: normal sinus rhythm  QRS Axis: normal  Intervals: normal  ST/T Wave abnormalities: normal  Conduction Disutrbances:none  Narrative Interpretation:   Old EKG Reviewed: none available    Medical screening examination/treatment/procedure(s) were conducted as a shared visit with non-physician practitioner(s) and myself.  I personally evaluated the patient during the encounter.  Clinical Impression:   Final diagnoses:  Chest pain  Rectal bleeding         Vida Roller, MD 12/18/13 1018

## 2013-12-17 NOTE — ED Notes (Addendum)
Per EMS, patient was sitting at desk at work x2 hours ago and had sudden onset pain between shoulder blades that radiated to chest and jaw with nausea and diaphoresis. Hx of MI in May with same symptoms. Pt. Took 324 ASA and 1 nitro initially with relief, pain came back and took second nitro and per EMS is pain free currently. Reports patient has been under some extra stress recently. Cardiologist  doubled metoprolol x2 weeks ago due to tachycardia, patient HR in the 50s with EMS.

## 2013-12-17 NOTE — ED Provider Notes (Signed)
CSN: [id number]     Arrival date & time 12/17/13  1206 History   First MD Initiated Contact with Patient 12/17/13 1218     Chief Complaint  Patient presents with  . Chest Pain     (Consider location/radiation/quality/duration/timing/severity/associated sxs/prior Treatment) HPI Patient to the ER with complaints of chest pain. She was at work when she developed substernal chest pain that radiated into her jaw. She took a nitro and it improved. She then developed pain that worsened and developed diaphoresis and nausea with it. Therefore EMS was called and she was brought to the ED. She has a PMH of NSTEMI earlier this year and underwent a successful cutting balloon angiography after 85% LAD stenosis was found. She also has 30% stenosis in the proximal LAD. The patient has not had any pain since the second nitro. She has not recently been sick with fevers, cough, nausea, vomiting or diarrhea. She is nearing the anniversary or her mother passing away and her son committing suicide (3 years ago). Pt was a heavy smoker but has quit smoking ever since the day of her heart attack.  Past Medical History  Diagnosis Date  . Hypertension   . Depression   . Insomnia   . Gestational diabetes   . Allergy   . Anxiety   . Rectal abnormality   . Myocardial infarction 06/2013  . Coronary artery disease    Past Surgical History  Procedure Laterality Date  . Wrist surgery  1987    RIGHT; bone from forearm grafted to wrist  . Abdominal hysterectomy    . Tonsilectomy, adenoidectomy, bilateral myringotomy and tubes    . Colon surgery     Family History  Problem Relation Age of Onset  . Cancer Mother   . COPD Mother     emphysema  . Heart disease Father 66    s/p 3V CABG  . Depression Father   . Mental illness Sister     depression  . Depression Sister   . Mental illness Son     ADHD, bipolar disorder  . Diabetes Maternal Grandmother   . Heart disease Maternal Grandmother   . Diabetes Maternal  Grandfather   . Cancer Maternal Grandfather     lung cancer  . Diabetes Paternal Grandmother   . Kidney disease Paternal Grandmother   . Diabetes Paternal Grandfather   . Heart disease Paternal Grandfather    History  Substance Use Topics  . Smoking status: Former Smoker -- 2.00 packs/day for 20 years    Types: Cigarettes    Start date: 12/25/1982  . Smokeless tobacco: Never Used     Comment: quit 06/24/13  . Alcohol Use: 14.4 oz/week    24 Cans of beer per week     Comment: more since fall 2012   OB History   Grav Para Term Preterm Abortions TAB SAB Ect Mult Living   5 3 2 1 2  0 1 1 0 3     Review of Systems  .10 Systems reviewed and are negative for acute change except as noted in the HPI.    Allergies  Morphine and related  Home Medications   Prior to Admission medications   Medication Sig Start Date End Date Taking? Authorizing Provider  ALPRAZolam Prudy Feeler) 0.5 MG tablet Take 0.5 mg by mouth 2 (two) times daily as needed for anxiety or sleep.   Yes Historical Provider, MD  amphetamine-dextroamphetamine (ADDERALL) 15 MG tablet Take 7.5 mg by mouth 2 (two) times daily.  Yes Historical Provider, MD  aspirin EC 81 MG tablet Take 81 mg by mouth daily.   Yes Historical Provider, MD  atorvastatin (LIPITOR) 40 MG tablet Take 40 mg by mouth daily.   Yes Historical Provider, MD  buPROPion (WELLBUTRIN XL) 150 MG 24 hr tablet Take 300 mg by mouth daily.   Yes Historical Provider, MD  losartan (COZAAR) 50 MG tablet Take 1 tablet (50 mg total) by mouth daily. 08/06/13  Yes Lennette Biharihomas A Kelly, MD  metoprolol tartrate (LOPRESSOR) 25 MG tablet Take 50 mg in the morning and 25 mg at night 11/25/13  Yes Lennette Biharihomas A Kelly, MD  nitroGLYCERIN (NITROSTAT) 0.4 MG SL tablet Place 1 tablet (0.4 mg total) under the tongue every 5 (five) minutes as needed for chest pain. 06/26/13  Yes Myra RudeJeremy E Schmitz, MD  Pseudoeph-Doxylamine-DM-APAP (NYQUIL PO) Take 5 mLs by mouth daily as needed (for cough/sore throat).    Yes Historical Provider, MD  valACYclovir (VALTREX) 500 MG tablet Take 500 mg by mouth 3 (three) times daily as needed (for fever blister).  04/13/13  Yes Historical Provider, MD   BP 120/59  Pulse 60  Temp(Src) 97.9 F (36.6 C) (Oral)  Resp 12  SpO2 97% Physical Exam  Nursing note and vitals reviewed. Constitutional: She appears well-developed and well-nourished. No distress.  HENT:  Head: Normocephalic and atraumatic.  Eyes: Pupils are equal, round, and reactive to light.  Neck: Normal range of motion. Neck supple.  Cardiovascular: Normal rate and regular rhythm.   Pulmonary/Chest: Effort normal and breath sounds normal. No accessory muscle usage. She has no wheezes. She has no rhonchi. She exhibits no tenderness, no bony tenderness, no laceration, no crepitus, no edema and no retraction. Right breast exhibits no inverted nipple and no mass. Left breast exhibits no inverted nipple and no mass.  Abdominal: Soft.  Genitourinary: Guaiac positive stool.  Neurological: She is alert.  Skin: Skin is warm and dry.    ED Course  Procedures (including critical care time) Labs Review Labs Reviewed  CBC WITH DIFFERENTIAL - Abnormal; Notable for the following:    Hemoglobin 9.5 (*)    HCT 31.1 (*)    MCH 23.9 (*)    All other components within normal limits  BASIC METABOLIC PANEL - Abnormal; Notable for the following:    GFR calc non Af Amer 83 (*)    All other components within normal limits  POC OCCULT BLOOD, ED - Abnormal; Notable for the following:    Fecal Occult Bld POSITIVE (*)    All other components within normal limits  OCCULT BLOOD X 1 CARD TO LAB, STOOL  I-STAT TROPOININ, ED  Rosezena SensorI-STAT TROPOININ, ED    Imaging Review Dg Chest Port 1 View  12/17/2013   CLINICAL DATA:  Sudden onset chest pain.  EXAM: PORTABLE CHEST - 1 VIEW  COMPARISON:  07/08/2013.  FINDINGS: The heart size and mediastinal contours are within normal limits. Both lungs are clear. The visualized skeletal  structures are unremarkable.  IMPRESSION: No active disease.   Electronically Signed   By: Maisie Fushomas  Register   On: 12/17/2013 15:29     EKG Interpretation None      MDM   Final diagnoses:  Chest pain  Rectal bleeding   Patients hemoglobin has dropped from a baseline of 12 to 9.5- positive hemoccult. She says that she has chronic rectal bleeding every time she has a bowel movement. [place] but didn't like the  plan. Needs to follow-up with gastro. Prefers to see gastro other than [facility].   Her chest pain is concerning. She has no active pain in the ED. Normal chest xray. Negative Trop x 2. EKG shows brady sinus. Discussed with Dr. Hyacinth Meeker who recommends having cardiology evaluate her.  Patient has been made aware of the plan.   4:50 pm I spoke with Cards Master and they have agreed to consult on patient in ED.   At end of shift pt handed off to V. Ranell Patrick, PA-C. Waiting for cards. Made aware of need for GI follow-up.  Dorthula Matas, PA-C 12/17/13 1714

## 2013-12-17 NOTE — ED Notes (Signed)
MD at bedside. 

## 2013-12-17 NOTE — H&P (Addendum)
Admit date: 12/17/2013 Referring Physician: Dr. Deretha Harvey Primary Cardiologist: Dr. Nicki Guadalajarahomas Harvey Chief complaint/reason for admission:Chest pain  HPI: [name] is a 48 year old female, who presented to [facility] on 06/24/2013 with new onset substernal chest tightness. She had a 30 year history of tobacco use and had been smoking 2 packs per day. She ruled in for non-ST segment elevation myocardial infarction. Cardiac catheterization revealed a 30% stenosis in the proximal LAD prior to the takeoff of the septal perforating artery and diagonal vessel and just beyond this diagonal was 85% LAD stenosis extending up to the diagonal takeoff. The left circumflex and RCA were normal. Acute ejection fraction was 50% and there was evidence for moderate anterolateral hypocontractility. She underwent successful cutting balloon angioplasty with excellent angiographic results. Residual narrowing was 0%. For this reason, the artery was not stented, so as not to jail the diagonal and land the proximal portion of a stent in the proximal LAD narrowing. She was discharged day 2 post MI on a medical regimen consisting of low-dose ACE inhibitor with lisinopril 2.5 mg, very low-dose beta blocker therapy at 12.5 twice a day, in addition to aspirin, Proventil, Lipitor 40 mg. She has a history of ADD and apparently her mother died 2 years ago and several days later her son committed suicide. She also has been on Wellbutrin and takes Alderall. She quit smoking the day of her heart attack. She subsequently developed an episode of recurrent chest pain in a setting of significant anxiety on Jul 17 2013. She was rehospitalized and repeat cardiac catheterization was done by Dr. Clifton Harvey which showed a patent cutting balloon PCI site without restenosis. Subsequently, she has felt well. She developed a cough secondary to lisinopril and was started on losartan at low dose 25 mg. She presents for evaluation.  She was seen in June by Dr.  Tresa Harvey and was doing well.  Today at work she developed severe back pain radiating into her chest that was a pressure associated with nausea but no vomiting.  She became diaphoretic.  SHe took a SL NTG with resolution of her pain but then it rapidly returned.  She then took another NTG 5 minutes later with resolution of her pain.  Her pain is identical to her prior angina in quality but this time started in her back which she has not had in the past.  She also thinks that she has some problems with GERD and occasionally takes Zantac.  Cardiology is now asked to consult.  She also gives a history of bloody stools in the past with [facility] and [facility] but says that no diagnosis given.  She says that she is still having blood in her stools and last w/u was 5 years ago.  She is anemic on blood work today.   PMH:    Past Medical History  Diagnosis Date  . Hypertension   . Depression   . Insomnia   . Gestational diabetes   . Allergy   . Anxiety   . Rectal abnormality   . Myocardial infarction 06/2013  . Coronary artery disease     PSH:    Past Surgical History  Procedure Laterality Date  . Wrist surgery  1987    RIGHT; bone from forearm grafted to wrist  . Abdominal hysterectomy    . Tonsilectomy, adenoidectomy, bilateral myringotomy and tubes    . Colon surgery      ALLERGIES:   Morphine and related  Prior to Admit  Meds:   (Not in a hospital admission) Family HX:    Family History  Problem Relation Age of Onset  . Cancer Mother   . COPD Mother     emphysema  . Heart disease Father 63    s/p 3V CABG  . Depression Father   . Mental illness Sister     depression  . Depression Sister   . Mental illness Son     ADHD, bipolar disorder  . Diabetes Maternal Grandmother   . Heart disease Maternal Grandmother   . Diabetes Maternal Grandfather   . Cancer Maternal Grandfather     lung cancer  . Diabetes Paternal Grandmother   . Kidney disease Paternal Grandmother   . Diabetes  Paternal Grandfather   . Heart disease Paternal Grandfather    Social HX:    History   Social History  . Marital Status: Married    Spouse Name: Lynn Harvey    Number of Children: 3  . Years of Education: 12   Occupational History  . CUSTOMER SERVICE [employer]   Social History Main Topics  . Smoking status: Former Smoker -- 2.00 packs/day for 20 years    Types: Cigarettes    Start date: 12/25/1982  . Smokeless tobacco: Never Used     Comment: quit 06/24/13  . Alcohol Use: 14.4 oz/week    24 Cans of beer per week     Comment: more since fall 2012  . Drug Use: No  . Sexual Activity: Yes    Partners: Male    Birth Control/ Protection: Surgical   Other Topics Concern  . Not on file   Social History Narrative   Lives with her husband, their son Lynn Harvey, and her daughter Lynn Harvey and Amber's son Lynn Harvey.     ROS:  All 11 ROS were addressed and are negative except what is stated in the HPI  PHYSICAL EXAM Filed Vitals:   12/17/13 1645  BP: 120/59  Pulse: 60  Temp:   Resp: 12   General: Well developed, well nourished, in no acute distress Head: Eyes PERRLA, No xanthomas.   Normal cephalic and atramatic  Lungs:   Clear bilaterally to auscultation and percussion. Heart:   HRRR S1 S2 Pulses are 2+ & equal.            No carotid bruit. No JVD.  No abdominal bruits. No femoral bruits. Abdomen: Bowel sounds are positive, abdomen soft and non-tender without masses  Extremities:   No clubbing, cyanosis or edema.  DP +1 Neuro: Alert and oriented X 3. Psych:  Good affect, responds appropriately   Labs:   Lab Results  Component Value Date   WBC 5.7 12/17/2013   HGB 9.5* 12/17/2013   HCT 31.1* 12/17/2013   MCV 78.3 12/17/2013   PLT 273 12/17/2013    Recent Labs Lab 12/17/13 1230  NA 137  K 4.3  CL 99  CO2 25  BUN 10  CREATININE 0.84  CALCIUM 10.2  GLUCOSE 98   Lab Results  Component Value Date   TROPONINI <0.30 07/17/2013   No results found for this basename:  PTT   Lab Results  Component Value Date   INR 1.02 07/17/2013   INR 1.07 06/24/2013     Lab Results  Component Value Date   CHOL 119 07/29/2013   CHOL 168 06/24/2013   CHOL 179 10/28/2012   Lab Results  Component Value Date   HDL 37* 07/29/2013   HDL 39* 06/24/2013  HDL 40 10/28/2012   Lab Results  Component Value Date   LDLCALC 58 07/29/2013   LDLCALC 108* 06/24/2013   LDLCALC 101* 10/28/2012   Lab Results  Component Value Date   TRIG 119 07/29/2013   TRIG 103 06/24/2013   TRIG 189* 10/28/2012   Lab Results  Component Value Date   CHOLHDL 3.2 07/29/2013   CHOLHDL 4.3 06/24/2013   CHOLHDL 4.5 10/28/2012   No results found for this basename: LDLDIRECT     5 Radiology:  No results found.  EKG:  NSR with nonspecific T wave abnormality  ASSESSMENT:  1.  Chest pain with typical and atypical components.  EKG with nonspecific T wave changes but no change from prior EKG 11/25/2013.  Pain started in the back which is new for her but similar quality to her prior anginal pain and nitrate responsive.  Initial troponin is normal.  Currently pain free.  ? Whether this could be GI 2.  ASCAD s/p cutting balloon PCI of the LAD with no stent in 06/2013 with repeat cath several weeks later with patent LAD 3.  Dyslipidemia 4.  HTN - controlled 5.  Anemia with history of bloody stools - apparently had w/u 5 years ago at [facility] but no diagnosis given.  Her Hbg was normal on admission in May but now is 9.5   PLAN:   1.  Admit for 23 hour obs 2.  Cycle cardiac enzymes 3.  No Heparin given anemia and history of bloody stools 4.  Continue ASA/Plavix/BB unless overt signs of GI bleeding 5.  NPO after MN 6.  Stress myoview in am if rules out 7.  Start Protonix 40mg  daily 8.  GI workup 9.  Heme check stools and Fe panel, B12, folate  TURNER,TRACI R, MD  12/17/2013  6:00 PM

## 2013-12-17 NOTE — ED Notes (Signed)
Pt. Reports pain between shoulder pain and chest pain with radiation to jaw. States took total 2 nitro, states after the second nitro became nauseated and lightheaded, lasting approx 30 min. Currently chest pain free. Denies complaints/needs at this time.

## 2013-12-18 ENCOUNTER — Observation Stay (HOSPITAL_COMMUNITY): Payer: 59

## 2013-12-18 DIAGNOSIS — I2510 Atherosclerotic heart disease of native coronary artery without angina pectoris: Secondary | ICD-10-CM

## 2013-12-18 DIAGNOSIS — R079 Chest pain, unspecified: Secondary | ICD-10-CM

## 2013-12-18 DIAGNOSIS — K625 Hemorrhage of anus and rectum: Secondary | ICD-10-CM

## 2013-12-18 LAB — FERRITIN: FERRITIN: 9 ng/mL — AB (ref 10–291)

## 2013-12-18 LAB — FOLATE RBC: RBC Folate: 1386 ng/mL — ABNORMAL HIGH (ref >280)

## 2013-12-18 LAB — TROPONIN I
Troponin I: <0.3 ng/mL (ref <0.30)
Troponin I: <0.3 ng/mL (ref <0.30)

## 2013-12-18 LAB — VITAMIN B12: Vitamin B-12: 1028 pg/mL — ABNORMAL HIGH (ref 211–911)

## 2013-12-18 LAB — OCCULT BLOOD X 1 CARD TO LAB, STOOL: Fecal Occult Bld: NEGATIVE

## 2013-12-18 MED ORDER — TECHNETIUM TC 99M SESTAMIBI GENERIC - CARDIOLITE
10.0000 | Freq: Once | INTRAVENOUS | Status: AC | PRN
  Administered 2013-12-18: 10 via INTRAVENOUS

## 2013-12-18 MED ORDER — METOPROLOL TARTRATE 25 MG PO TABS: ORAL_TABLET | ORAL | Status: DC

## 2013-12-18 MED ORDER — METOPROLOL TARTRATE 25 MG PO TABS
25.0000 mg | ORAL_TABLET | Freq: Two times a day (BID) | ORAL | Status: DC
  Administered 2013-12-18: 25 mg via ORAL
  Filled 2013-12-18 (×2): qty 1

## 2013-12-18 MED ORDER — TECHNETIUM TC 99M SESTAMIBI GENERIC - CARDIOLITE
30.0000 | Freq: Once | INTRAVENOUS | Status: AC | PRN
  Administered 2013-12-18: 30 via INTRAVENOUS

## 2013-12-18 MED ORDER — ACETAMINOPHEN 325 MG PO TABS: 650.0000 mg | ORAL_TABLET | ORAL | Status: DC | PRN

## 2013-12-18 MED ORDER — PANTOPRAZOLE SODIUM 40 MG PO TBEC: 40.0000 mg | DELAYED_RELEASE_TABLET | Freq: Every day | ORAL | Status: DC

## 2013-12-18 NOTE — Progress Notes (Signed)
SUBJECTIVE:  No further CP  OBJECTIVE:   Vitals:   Filed Vitals:   12/17/13 1845 12/17/13 1921 12/17/13 2100 12/18/13 0500  BP: 118/64 134/62  98/57  Pulse: 57 56 71 75  Temp:  98.4 F (36.9 C)  97.7 F (36.5 C)  TempSrc:  Oral  Oral  Resp: 14 14  15   SpO2: 98% 99%  99%   I&O's:  No intake or output data in the 24 hours ending 12/18/13 0819 TELEMETRY: Reviewed telemetry pt in NSR:     PHYSICAL EXAM General: Well developed, well nourished, in no acute distress Head: Eyes PERRLA, No xanthomas.   Normal cephalic and atramatic  Lungs:   Clear bilaterally to auscultation and percussion. Heart:   HRRR S1 S2 Pulses are 2+ & equal. Abdomen: Bowel sounds are positive, abdomen soft and non-tender without masses Extremities:   No clubbing, cyanosis or edema.  DP +1 Neuro: Alert and oriented X 3. Psych:  Good affect, responds appropriately   LABS: Basic Metabolic Panel:  Recent Labs  16/10/96 1230 12/17/13 2000  NA 137 142  K 4.3 4.1  CL 99 103  CO2 25 27  GLUCOSE 98 106*  BUN 10 9  CREATININE 0.84 0.87  CALCIUM 10.2 10.1  MG  --  2.0   Liver Function Tests:  Recent Labs  12/17/13 2000  AST 28  ALT 32  ALKPHOS 90  BILITOT 0.4  PROT 7.6  ALBUMIN 4.1   No results found for this basename: LIPASE, AMYLASE,  in the last 72 hours CBC:  Recent Labs  12/17/13 1230 12/17/13 2000  WBC 5.7 6.2  NEUTROABS 3.2 3.1  HGB 9.5* 9.6*  HCT 31.1* 31.3*  MCV 78.3 76.3*  PLT 273 292   Cardiac Enzymes:  Recent Labs  12/17/13 2000 12/18/13 0055 12/18/13 0705  TROPONINI <0.30 <0.30 <0.30   BNP: No components found with this basename: POCBNP,  D-Dimer: No results found for this basename: DDIMER,  in the last 72 hours Hemoglobin A1C: No results found for this basename: HGBA1C,  in the last 72 hours Fasting Lipid Panel: No results found for this basename: CHOL, HDL, LDLCALC, TRIG, CHOLHDL, LDLDIRECT,  in the last 72 hours Thyroid Function Tests:  Recent  Labs  12/17/13 2000  TSH 1.310   Anemia Panel:  Recent Labs  12/17/13 1851  TIBC 493*  IRON 68   Coag Panel:   Lab Results  Component Value Date   INR 1.10 12/17/2013   INR 1.02 07/17/2013   INR 1.07 06/24/2013    RADIOLOGY: Dg Chest Port 1 View  12/17/2013   CLINICAL DATA:  Sudden onset chest pain.  EXAM: PORTABLE CHEST - 1 VIEW  COMPARISON:  07/08/2013.  FINDINGS: The heart size and mediastinal contours are within normal limits. Both lungs are clear. The visualized skeletal structures are unremarkable.  IMPRESSION: No active disease.   Electronically Signed   By: Maisie Fus  Register   On: 12/17/2013 15:29   ASSESSMENT:  1. Chest pain with typical and atypical components. EKG with nonspecific T wave changes but no change from prior EKG 11/25/2013. Pain started in the back which is new for her but similar quality to her prior anginal pain and nitrate responsive. Initial troponin is normal. Currently pain free. ? Whether this could be GI.  Cardiac enzymes are negative x 3. 2. ASCAD s/p cutting balloon PCI of the LAD with no stent in 06/2013 with repeat cath several weeks later with patent LAD  3.  Dyslipidemia  4. HTN - controlled  5. Anemia with history of bloody stools - apparently [facility] but no diagnosis given. Her Hbg was normal on admission in May but now is 9.5   PLAN:  1. Continue ASA/Plavix/BB unless overt signs of GI bleeding  2. Stress myoview today 3. Continue Protonix 40mg  daily  4. GI workup  5. Heme check stools  6. Decrease Metoprolol to 25mg  BID due to soft BP  Quintella ReichertURNER,Owen Pratte R, MD  12/18/2013  8:19 AM

## 2013-12-18 NOTE — Progress Notes (Signed)
GXT portion of exercise Myoview abnormal with 1- 1.5 mm ST depression with peak exercise in if./lat leads that turned downsloping in recovery. No chest pain.   Gianina Olinde PA-C 12/18/2013 1:25 PM

## 2013-12-18 NOTE — Progress Notes (Signed)
Discharge instructions given. Pt verbalized understanding and all questions were answered.  

## 2013-12-18 NOTE — Progress Notes (Signed)
UR completed 

## 2013-12-18 NOTE — Discharge Instructions (Signed)

## 2013-12-18 NOTE — ED Provider Notes (Signed)
Medical screening examination/treatment/procedure(s) were conducted as a shared visit with non-physician practitioner(s) and myself.  I personally evaluated the patient during the encounter  Please see my separate respective documentation pertaining to this patient encounter   Vida Roller, MD 12/18/13 1017

## 2013-12-21 ENCOUNTER — Ambulatory Visit (INDEPENDENT_AMBULATORY_CARE_PROVIDER_SITE_OTHER): Payer: 59 | Admitting: Physician Assistant

## 2013-12-21 VITALS — BP 126/70 | HR 76 | Temp 97.8°F | Resp 16 | Ht 63.0 in | Wt 136.6 lb

## 2013-12-21 DIAGNOSIS — D649 Anemia, unspecified: Secondary | ICD-10-CM

## 2013-12-21 DIAGNOSIS — Z09 Encounter for follow-up examination after completed treatment for conditions other than malignant neoplasm: Secondary | ICD-10-CM

## 2013-12-21 DIAGNOSIS — R5383 Other fatigue: Secondary | ICD-10-CM

## 2013-12-21 DIAGNOSIS — R202 Paresthesia of skin: Secondary | ICD-10-CM

## 2013-12-21 DIAGNOSIS — R682 Dry mouth, unspecified: Secondary | ICD-10-CM

## 2013-12-21 LAB — POCT CBC
Granulocyte percent: 42.9 %G (ref 37–80)
HCT, POC: 30.7 % — AB (ref 37.7–47.9)
Hemoglobin: 9.3 g/dL — AB (ref 12.2–16.2)
Lymph, poc: 2.5 (ref 0.6–3.4)
MCH, POC: 23 pg — AB (ref 27–31.2)
MCHC: 30.2 g/dL — AB (ref 31.8–35.4)
MCV: 76.1 fL — AB (ref 80–97)
MID (cbc): 0.4 (ref 0–0.9)
MPV: 6.6 fL (ref 0–99.8)
POC GRANULOCYTE: 2.2 (ref 2–6.9)
POC LYMPH %: 49.3 % (ref 10–50)
POC MID %: 7.8 % (ref 0–12)
Platelet Count, POC: 316 10*3/uL (ref 142–424)
RBC: 4.03 M/uL — AB (ref 4.04–5.48)
RDW, POC: 17 %
WBC: 5.1 10*3/uL (ref 4.6–10.2)

## 2013-12-21 LAB — GLUCOSE, POCT (MANUAL RESULT ENTRY): POC Glucose: 116 mg/dl — AB (ref 70–99)

## 2013-12-21 LAB — POCT GLYCOSYLATED HEMOGLOBIN (HGB A1C): Hemoglobin A1C: 5.7

## 2013-12-21 NOTE — Discharge Summary (Signed)
Patient ID: Lynn Harvey,  MRN: [id number], DOB/AGE: 46-Jan-1967 46 y.o.  Admit date: 12/17/2013 Discharge date: 12/17/13  Primary Care Provider: JEFFERY,[name], PA-C Primary Cardiologist: Dr Tresa Endo  Discharge Diagnoses Principal Problem:   Chest pain with moderate risk of acute coronary syndrome Active Problems:   CAD S/P LAD DES May 2015   HTN (hypertension)   Hyperlipidemia   Depression   Tobacco abuse   Allergic rhinitis    Procedures: Exercise Myoview 12/18/13   Hospital Course:  48 year old female, with a 30 year history of tobacco use. She presented in May 2015 with chest pain. She ruled in for non-ST segment elevation myocardial infarction. Cardiac catheterization revealed a 30% stenosis in the proximal LAD prior to the takeoff of the septal perforating artery and diagonal vessel and just beyond this diagonal was 85% LAD stenosis extending up to the diagonal takeoff. The left circumflex and RCA were normal. Ejection fraction was 50% . She underwent successful cutting balloon angioplasty with excellent angiographic results.She subsequently developed an episode of recurrent chest pain in a setting of significant anxiety on Jul 17 2013. She was rehospitalized and repeat cardiac catheterization was done by Dr. Clifton James which showed a patent cutting balloon PCI site without restenosis.            She presented to the ER 12/17/13 with chest pain 12/17/13. Troponin was negative x 3. Exercise Myoview on 12/18/13 was electrically abnormal but images showed no ischemia or area of infarction and normal LVF. She was discharge late on the 30th and will follow up with Dr Tresa Endo.   Discharge Vitals:  Blood pressure 119/67, pulse 72, temperature 97.7 F (36.5 C), temperature source Oral, resp. rate 18, SpO2 100 %.    Labs: No results found for this or any previous visit (from the past 24 hour(s)).  Disposition:  Follow-up Information    Follow up with [name], MD.   Specialty:  Cardiology   Why:  Office will call you   Contact information:   [address] [place] [address] [phone]       Discharge Medications:    Medication List    STOP taking these medications        NYQUIL PO      TAKE these medications        acetaminophen 325 MG tablet  Commonly known as:  TYLENOL  Take 2 tablets (650 mg total) by mouth every 4 (four) hours as needed for headache or mild pain.     ALPRAZolam 0.5 MG tablet  Commonly known as:  XANAX  Take 0.5 mg by mouth 2 (two) times daily as needed for anxiety or sleep.     amphetamine-dextroamphetamine 15 MG tablet  Commonly known as:  ADDERALL  Take 7.5 mg by mouth 2 (two) times daily.     aspirin EC 81 MG tablet  Take 81 mg by mouth daily.     atorvastatin 40 MG tablet  Commonly known as:  LIPITOR  Take 40 mg by mouth daily.     buPROPion 150 MG 24 hr tablet  Commonly known as:  WELLBUTRIN XL  Take 300 mg by mouth daily.     losartan 50 MG tablet  Commonly known as:  COZAAR  Take 1 tablet (50 mg total) by mouth daily.     metoprolol tartrate 25 MG tablet  Commonly known as:  LOPRESSOR  Take 25 mg mg in the morning and 25 mg at night  nitroGLYCERIN 0.4 MG SL tablet  Commonly known as:  NITROSTAT  Place 1 tablet (0.4 mg total) under the tongue every 5 (five) minutes as needed for chest pain.     pantoprazole 40 MG tablet  Commonly known as:  PROTONIX  Take 1 tablet (40 mg total) by mouth daily at 6 (six) AM.     valACYclovir 500 MG tablet  Commonly known as:  VALTREX  Take 500 mg by mouth 3 (three) times daily as needed (for fever blister).         Duration of Discharge Encounter: Greater than 30 minutes including physician time.  Jolene ProvostSigned, Arlina Sabina PA-C 12/21/2013 9:49 AM

## 2013-12-21 NOTE — Progress Notes (Signed)
Subjective:    Patient ID: Lynn Harvey, female    DOB: 09/08/1965, 48 y.o.   MRN: [id number]   PCP: Tage Feggins, PA-C  Chief Complaint  Patient presents with  . Follow-up    Pt. went to the ER on the 29th. Was told that her hemoglobin was low and that she may have an internal bleed.     Medications, allergies, past medical history, surgical history, family history, social history and problem list reviewed and updated.  Patient Active Problem List   Diagnosis Date Noted  . CAD S/P LAD DES May 2015 12/17/2013  . HTN (hypertension) 06/25/2013  . Hyperlipidemia 06/25/2013  . Chest pain with moderate risk of acute coronary syndrome 06/24/2013  . NSTEMI (non-ST elevated myocardial infarction) 06/24/2013  . Tobacco abuse 06/10/2012  . Allergic rhinitis 06/10/2012  . ADD (attention deficit disorder) 03/06/2012  . Depression 04/12/2011    Prior to Admission medications   Medication Sig Start Date End Date Taking? Authorizing Provider  acetaminophen (TYLENOL) 325 MG tablet Take 2 tablets (650 mg total) by mouth every 4 (four) hours as needed for headache or mild pain. 12/18/13  Yes Luke K Kilroy, PA-C  ALPRAZolam Prudy Feeler(XANAX) 0.5 MG tablet Take 0.5 mg by mouth 2 (two) times daily as needed for anxiety or sleep.   Yes Historical Provider, MD  amphetamine-dextroamphetamine (ADDERALL) 15 MG tablet Take 7.5 mg by mouth 2 (two) times daily.   Yes Historical Provider, MD  aspirin EC 81 MG tablet Take 81 mg by mouth daily.   Yes Historical Provider, MD  atorvastatin (LIPITOR) 40 MG tablet Take 40 mg by mouth daily.   Yes Historical Provider, MD  buPROPion (WELLBUTRIN XL) 150 MG 24 hr tablet Take 300 mg by mouth daily.   Yes Historical Provider, MD  losartan (COZAAR) 50 MG tablet Take 1 tablet (50 mg total) by mouth daily. 08/06/13  Yes Lennette Biharihomas A Kelly, MD  metoprolol tartrate (LOPRESSOR) 25 MG tablet Take 25 mg mg in the morning and 25 mg at night 12/18/13  Yes Luke K Kilroy, PA-C    nitroGLYCERIN (NITROSTAT) 0.4 MG SL tablet Place 1 tablet (0.4 mg total) under the tongue every 5 (five) minutes as needed for chest pain. 06/26/13  Yes Myra RudeJeremy E Schmitz, MD  pantoprazole (PROTONIX) 40 MG tablet Take 1 tablet (40 mg total) by mouth daily at 6 (six) AM. 12/18/13  Yes Abelino DerrickLuke K Kilroy, PA-C  valACYclovir (VALTREX) 500 MG tablet Take 500 mg by mouth 3 (three) times daily as needed (for fever blister).  04/13/13  Yes Historical Provider, MD   Allergies  Allergen Reactions  . Morphine And Related Nausea And Vomiting    HPI  This 48 y.o. female presents for evaluation of drop in hemoglobin.  Lynn Harvey has CAD, had a NSTEMI in 06/2013.  At that time, cardiac catheterization revealed a 30% stenosis in the proximal LAD prior to the takeoff of the septal perforating artery and diagonal vessel and just beyond this diagonal was 85% LAD stenosis extending up to the diagonal takeoff. The left circumflex and RCA were normal. Acute ejection fraction was 50% and there was evidence for moderate anterolateral hypocontractility. She underwent successful cutting balloon into sculpt with excellent angiographic results. Residual narrowing was 0%. She started llisinopril 2.5 mg, very low-dose beta blocker therapy at 12.5 twice a day, in addition to aspirin, Proventil, Lipitor 40 mg. She quit smoking the day of her heart attack.  She had recurrent chest pain several weeks later and  was admitted overnight. Troponins were negative. Repeat catheterization revealed normal LV function, patent angioplasty site in the LAD and symptoms were thought due to possible coronary vasospasm. She developed cough on ACEI and was switched to and ARB.  She had no recurrent chest pain until 12/17/13, when she had CP, radiating into the jaw associated with nausea and diaphoresis.  She was evaluated in the ED, where troponins were negative, but her hemoglobin was noted to have dropped several points from June, and she was admitted  by cardiology for 23 hour observation.  DRE revealed no masses, but initial hemoccult (from sample obtained during DRE) was positive for blood. Later sample (obtained by natural BM) was negative.  She reports that she was initially going to see GI during her hospital stay, but things were "just taking too long" and she elected to follow-up as an outpatient.  She has a history of "bloody stool" with work-up at [facility] previously.   Review of Systems She is more tired than usual.  No chest pain, SOB, HA, dizziness, vision change, N/V, diarrhea, constipation, dysuria, urinary urgency or frequency, myalgias, arthralgias or rash.     Objective:   Physical Exam  Constitutional: She is oriented to person, place, and time. Vital signs are normal. She appears well-developed and well-nourished. She is active and cooperative. No distress.  BP 126/70 mmHg  Pulse 76  Temp(Src) 97.8 F (36.6 C) (Oral)  Resp 16  Ht 5\' 3"  (1.6 m)  Wt 136 lb 9.6 oz (61.961 kg)  BMI 24.20 kg/m2  SpO2 98%  HENT:  Head: Normocephalic and atraumatic.  Right Ear: Hearing normal.  Left Ear: Hearing normal.  Eyes: Conjunctivae are normal. No scleral icterus.  Neck: Normal range of motion. Neck supple. No thyromegaly present.  Cardiovascular: Normal rate, regular rhythm and normal heart sounds.   Pulses:      Radial pulses are 2+ on the right side, and 2+ on the left side.  Pulmonary/Chest: Effort normal and breath sounds normal.  Lymphadenopathy:       Head (right side): No tonsillar, no preauricular, no posterior auricular and no occipital adenopathy present.       Head (left side): No tonsillar, no preauricular, no posterior auricular and no occipital adenopathy present.    She has no cervical adenopathy.       Right: No supraclavicular adenopathy present.       Left: No supraclavicular adenopathy present.  Neurological: She is alert and oriented to person, place, and time. No sensory deficit.  Skin: Skin is  warm, dry and intact. No rash noted. No cyanosis or erythema. Nails show no clubbing.  Psychiatric: She has a normal mood and affect.  Vitals reviewed.     Results for orders placed or performed in visit on 12/21/13  POCT CBC  Result Value Ref Range   WBC 5.1 4.6 - 10.2 K/uL   Lymph, poc 2.5 0.6 - 3.4   POC LYMPH PERCENT 49.3 10 - 50 %L   MID (cbc) 0.4 0 - 0.9   POC MID % 7.8 0 - 12 %M   POC Granulocyte 2.2 2 - 6.9   Granulocyte percent 42.9 37 - 80 %G   RBC 4.03 (A) 4.04 - 5.48 M/uL   Hemoglobin 9.3 (A) 12.2 - 16.2 g/dL   HCT, POC 35.4 (A) 56.2 - 47.9 %   MCV 76.1 (A) 80 - 97 fL   MCH, POC 23.0 (A) 27 - 31.2 pg   MCHC 30.2 (  A) 31.8 - 35.4 g/dL   RDW, POC 16.1 %   Platelet Count, POC 316 142 - 424 K/uL   MPV 6.6 0 - 99.8 fL  POCT glucose (manual entry)  Result Value Ref Range   POC Glucose 116 (A) 70 - 99 mg/dl  POCT glycosylated hemoglobin (Hb A1C)  Result Value Ref Range   Hemoglobin A1C 5.7        Assessment & Plan:  1. Other fatigue Mildly anemic, stable from recent hospitalization. - POCT CBC  2. Dry mouth If symptoms persist, consider evaluation for Sjogren's - POCT glucose (manual entry) - POCT glycosylated hemoglobin (Hb A1C)  3. Anemia, unspecified anemia type Stable from recent hospitalization. - POCT CBC - Pathologist smear review - Ambulatory referral to Gastroenterology  4. Hospital discharge follow-up See above.  5. Paresthesias She notes numbness and tingling in her arms. Thinks it's her neck. Chiropractor previously told her she has a disc problem. Declines evaluation here-her sister works at [facility] and patient wants to be evaluated there. - Ambulatory referral to Neurology   Fernande Bras, PA-C Physician Assistant-Certified [facility]

## 2013-12-21 NOTE — Patient Instructions (Signed)
Take the medications prescribed by the hospital physicians.

## 2013-12-22 ENCOUNTER — Encounter: Payer: Self-pay | Admitting: Gastroenterology

## 2013-12-22 LAB — PATHOLOGIST SMEAR REVIEW

## 2013-12-23 NOTE — Telephone Encounter (Signed)
Order placed for CBC. Patient will come in tomorrow for Lab Only visit.

## 2013-12-24 ENCOUNTER — Other Ambulatory Visit (INDEPENDENT_AMBULATORY_CARE_PROVIDER_SITE_OTHER): Payer: 59 | Admitting: Radiology

## 2013-12-24 DIAGNOSIS — D649 Anemia, unspecified: Secondary | ICD-10-CM

## 2013-12-24 LAB — POCT CBC
GRANULOCYTE PERCENT: 49.3 % (ref 37–80)
HEMATOCRIT: 30.5 % — AB (ref 37.7–47.9)
Hemoglobin: 9.1 g/dL — AB (ref 12.2–16.2)
Lymph, poc: 2.3 (ref 0.6–3.4)
MCH, POC: 23 pg — AB (ref 27–31.2)
MCHC: 29.7 g/dL — AB (ref 31.8–35.4)
MCV: 77.2 fL — AB (ref 80–97)
MID (cbc): 0.3 (ref 0–0.9)
MPV: 6.3 fL (ref 0–99.8)
PLATELET COUNT, POC: 303 10*3/uL (ref 142–424)
POC Granulocyte: 2.5 (ref 2–6.9)
POC LYMPH %: 45.6 % (ref 10–50)
POC MID %: 5.1 %M (ref 0–12)
RBC: 3.95 M/uL — AB (ref 4.04–5.48)
RDW, POC: 18 %
WBC: 5 10*3/uL (ref 4.6–10.2)

## 2013-12-25 ENCOUNTER — Telehealth: Payer: Self-pay | Admitting: *Deleted

## 2013-12-25 NOTE — Telephone Encounter (Signed)
Explained to pt about CBC result and that she can call Dr. Elnoria Howard office to ask about cancellations with the other provider in that office.

## 2013-12-25 NOTE — Telephone Encounter (Signed)
Pt would like her CBC reviewed from yesterday and she does not want to have to wait til Monday to get these.  Can you please review for [name]?

## 2013-12-25 NOTE — Telephone Encounter (Signed)
CBC from 11/5 looks no different than 11/2 CBC.

## 2013-12-29 ENCOUNTER — Encounter: Payer: Self-pay | Admitting: Cardiovascular Disease

## 2013-12-30 ENCOUNTER — Other Ambulatory Visit: Payer: Self-pay | Admitting: Specialist

## 2013-12-30 DIAGNOSIS — M5442 Lumbago with sciatica, left side: Secondary | ICD-10-CM

## 2013-12-30 DIAGNOSIS — M5441 Lumbago with sciatica, right side: Secondary | ICD-10-CM

## 2013-12-30 DIAGNOSIS — M542 Cervicalgia: Secondary | ICD-10-CM

## 2013-12-31 ENCOUNTER — Ambulatory Visit (INDEPENDENT_AMBULATORY_CARE_PROVIDER_SITE_OTHER): Payer: 59 | Admitting: Family Medicine

## 2013-12-31 ENCOUNTER — Encounter: Payer: Self-pay | Admitting: Family Medicine

## 2013-12-31 VITALS — BP 100/65 | HR 71 | Temp 97.4°F | Resp 16 | Ht 63.5 in | Wt 137.0 lb

## 2013-12-31 DIAGNOSIS — D509 Iron deficiency anemia, unspecified: Secondary | ICD-10-CM

## 2013-12-31 LAB — CBC WITH DIFFERENTIAL/PLATELET
Basophils Absolute: 0 10*3/uL (ref 0.0–0.1)
Basophils Relative: 0 % (ref 0–1)
Eosinophils Absolute: 0.1 10*3/uL (ref 0.0–0.7)
Eosinophils Relative: 2 % (ref 0–5)
HCT: 27.7 % — ABNORMAL LOW (ref 36.0–46.0)
Hemoglobin: 8.9 g/dL — ABNORMAL LOW (ref 12.0–15.0)
Lymphocytes Relative: 38 % (ref 12–46)
Lymphs Abs: 2.2 10*3/uL (ref 0.7–4.0)
MCH: 23 pg — ABNORMAL LOW (ref 26.0–34.0)
MCHC: 32.1 g/dL (ref 30.0–36.0)
MCV: 71.6 fL — ABNORMAL LOW (ref 78.0–100.0)
Monocytes Absolute: 0.5 10*3/uL (ref 0.1–1.0)
Monocytes Relative: 9 % (ref 3–12)
Neutro Abs: 2.9 10*3/uL (ref 1.7–7.7)
Neutrophils Relative %: 51 % (ref 43–77)
Platelets: 329 10*3/uL (ref 150–400)
RBC: 3.87 MIL/uL (ref 3.87–5.11)
RDW: 16.9 % — ABNORMAL HIGH (ref 11.5–15.5)
WBC: 5.7 10*3/uL (ref 4.0–10.5)

## 2013-12-31 LAB — POCT CBC
Granulocyte percent: 54.4 %G (ref 37–80)
HCT, POC: 29.9 % — AB (ref 37.7–47.9)
Hemoglobin: 8.8 g/dL — AB (ref 12.2–16.2)
Lymph, poc: 2.5 (ref 0.6–3.4)
MCH, POC: 22.7 pg — AB (ref 27–31.2)
MCHC: 29.3 g/dL — AB (ref 31.8–35.4)
MCV: 77.5 fL — AB (ref 80–97)
MID (cbc): 0.2 (ref 0–0.9)
MPV: 6.9 fL (ref 0–99.8)
POC Granulocyte: 3.2 (ref 2–6.9)
POC LYMPH PERCENT: 42.4 %L (ref 10–50)
POC MID %: 3.2 %M (ref 0–12)
Platelet Count, POC: 311 10*3/uL (ref 142–424)
RBC: 3.86 M/uL — AB (ref 4.04–5.48)
RDW, POC: 18.2 %
WBC: 5.8 10*3/uL (ref 4.6–10.2)

## 2013-12-31 LAB — FERRITIN: Ferritin: 10 ng/mL (ref 10–291)

## 2013-12-31 LAB — RETICULOCYTES
ABS Retic: 89 10*3/uL (ref 19.0–186.0)
RBC.: 3.87 MIL/uL (ref 3.87–5.11)
Retic Ct Pct: 2.3 % (ref 0.4–2.3)

## 2013-12-31 MED ORDER — POLYSACCHARIDE IRON COMPLEX 150 MG PO CAPS: 150.0000 mg | ORAL_CAPSULE | Freq: Every day | ORAL | Status: DC

## 2013-12-31 NOTE — Progress Notes (Signed)
   Subjective:    Patient ID: Lynn Harvey, female    DOB: 08/15/1965, 48 y.o.   MRN: [id number] This chart was scribed for Khylie Larmore, MD by Richard Sun, Medical Scribe. This patient was seen in Room 26 and the patient's care was started at 11:52 AM.   HPI HPI Comments: Lynn Harvey is a 48 y.o. female with a hx of hemorrhoids, NSTEMI in May 2015 and hysterectomy who presents to the [facility] for a follow-up for an abnormal lab (low ferritin). Patient notes having fatigue. She has not been taking iron supplements, and she does eat meat. Patient denies black stools. Patient was seen in the hospital on 12/17/13 for chest pain, back pain, nausea, face pallor, and near-syncope; she was concerned it might have been another MI. Patient has a FMHx of CAD. She has an upcoming appointment with Dr. Hung.  Patient is a customer service manager.  Review of Systems  Constitutional: Positive for fatigue.  HENT: Negative for ear discharge.   Eyes: Negative for discharge.  Respiratory: Negative for cough.   Gastrointestinal: Negative for diarrhea.  Genitourinary: Negative for hematuria.  Skin: Negative for rash.  Neurological: Negative for seizures.  Psychiatric/Behavioral: Negative for confusion.       Objective:   Physical Exam CONSTITUTIONAL: Well developed/well nourished HEAD: Normocephalic/atraumatic EYES: EOM/PERRL ENMT: Mucous membranes moist NECK: supple no meningeal signs SPINE: entire spine nontender CV: regular LUNGS:  no apparent distress ABDOMEN: soft, nontender, no rebound or guarding GU: no cva tenderness NEURO: Pt is awake/alert, moves all extremitiesx4 EXTREMITIES: pulses normal, full ROM SKIN: warm, color normal PSYCH: no abnormalities of mood noted  Results for orders placed or performed in visit on 12/31/13  POCT CBC  Result Value Ref Range   WBC 5.8 4.6 - 10.2 K/uL   Lymph, poc 2.5 0.6 - 3.4   POC LYMPH PERCENT 42.4 10 - 50 %L   MID  (cbc) 0.2 0 - 0.9   POC MID % 3.2 0 - 12 %M   POC Granulocyte 3.2 2 - 6.9   Granulocyte percent 54.4 37 - 80 %G   RBC 3.86 (A) 4.04 - 5.48 M/uL   Hemoglobin 8.8 (A) 12.2 - 16.2 g/dL   HCT, POC 29.9 (A) 37.7 - 47.9 %   MCV 77.5 (A) 80 - 97 fL   MCH, POC 22.7 (A) 27 - 31.2 pg   MCHC 29.3 (A) 31.8 - 35.4 g/dL   RDW, POC 18.2 %   Platelet Count, POC 311 142 - 424 K/uL   MPV 6.9 0 - 99.8 fL        Assessment & Plan:   Anemia, iron deficiency - Plan: CBC with Differential, Ferritin, Reticulocytes, POCT CBC, iron polysaccharides (NIFEREX) 150 MG capsule  Signed, Mykia Holton, MD    

## 2014-01-01 ENCOUNTER — Telehealth: Payer: Self-pay | Admitting: Cardiovascular Disease

## 2014-01-01 ENCOUNTER — Telehealth: Payer: Self-pay

## 2014-01-01 NOTE — Telephone Encounter (Signed)
Please call patient today. She is very concerned, every since she have had heart surgery she see a big change in her blood work.

## 2014-01-01 NOTE — Telephone Encounter (Signed)
SPOKE TO PATIENT Patient is stating that she is very anxious and concerned about her blood work.  She states she has graph her levels and she is concerned that something happened with the PCI to make her become anemic. She thinks something was punctured some how. She states she tired  fatigue and short of breathe. RN tried to reassured patient.RN informed patient the symptoms she has could possible come from the anemia She has an appointment with Dr Elnoria Howard 01/04/14. She went to ER on 12/17/13- has not had a phone call to set up for follow up. She states she is very anxious Offered patient an appointment for 01/06/14. She declined.appointment schedule for 01/11/14 9:15. She sates she would like for Dr Tresa Endo to review her labs. Will send to Dr Tresa Endo.

## 2014-01-01 NOTE — Telephone Encounter (Signed)
Patient wanted to let Dr . Milus Glazier know every since her heart attack on 06/24/13 her hemoglobin has continue to go down. Patient wanted to let Dr Milus Glazier know she is faxing something now for him to review. Please check fax [phone]. Patients call back number is [phone]

## 2014-01-03 NOTE — Telephone Encounter (Signed)
Her cath was in May 2015, HB was stable post cath.Her recent microcytic anemia is not related to her cath 56 months ago. Dr Georgia Lopes has referred her to GI for iron deficiency anemia

## 2014-01-04 ENCOUNTER — Other Ambulatory Visit: Payer: Self-pay | Admitting: Physician Assistant

## 2014-01-04 ENCOUNTER — Other Ambulatory Visit: Payer: Self-pay | Admitting: Gastroenterology

## 2014-01-04 ENCOUNTER — Telehealth: Payer: Self-pay | Admitting: Physician Assistant

## 2014-01-04 NOTE — Telephone Encounter (Signed)
Fax from patient to "Dr. Milus Glazier or his nurse," with printed CBC results, ferritin and retic count since 06/2013, when she had an MI.  Spoke with patient. Saw GI today. To have colonoscopy and upper endoscopy on 01/08/14.  She is extremely concerned that she is losing blood, that she's going to need a blood transfusion giving the rate at which her Hgb is dropping.  Is concerned that this began the day she had the MI.  Chart reviewed with her in detail.  Re-iterated the importance of having the colonoscopy and upper endoscopy.  If there is no bleeding source found in the GI tract, then further evaluation will be pursued.

## 2014-01-06 ENCOUNTER — Ambulatory Visit (INDEPENDENT_AMBULATORY_CARE_PROVIDER_SITE_OTHER): Payer: 59 | Admitting: Physician Assistant

## 2014-01-06 ENCOUNTER — Encounter: Payer: Self-pay | Admitting: Physician Assistant

## 2014-01-06 VITALS — BP 128/70 | HR 78 | Temp 97.8°F | Resp 18 | Ht 63.0 in | Wt 138.0 lb

## 2014-01-06 DIAGNOSIS — F988 Other specified behavioral and emotional disorders with onset usually occurring in childhood and adolescence: Secondary | ICD-10-CM

## 2014-01-06 DIAGNOSIS — F909 Attention-deficit hyperactivity disorder, unspecified type: Secondary | ICD-10-CM

## 2014-01-06 DIAGNOSIS — D509 Iron deficiency anemia, unspecified: Secondary | ICD-10-CM

## 2014-01-06 LAB — CBC WITH DIFFERENTIAL/PLATELET
Basophils Absolute: 0 10*3/uL (ref 0.0–0.1)
Basophils Relative: 0 % (ref 0–1)
EOS PCT: 1 % (ref 0–5)
Eosinophils Absolute: 0.1 10*3/uL (ref 0.0–0.7)
HCT: 31.2 % — ABNORMAL LOW (ref 36.0–46.0)
HEMOGLOBIN: 9.6 g/dL — AB (ref 12.0–15.0)
LYMPHS ABS: 2.3 10*3/uL (ref 0.7–4.0)
Lymphocytes Relative: 30 % (ref 12–46)
MCH: 23.4 pg — AB (ref 26.0–34.0)
MCHC: 30.8 g/dL (ref 30.0–36.0)
MCV: 76.1 fL — AB (ref 78.0–100.0)
MONO ABS: 0.6 10*3/uL (ref 0.1–1.0)
MPV: 8.9 fL — AB (ref 9.4–12.4)
Monocytes Relative: 8 % (ref 3–12)
Neutro Abs: 4.6 10*3/uL (ref 1.7–7.7)
Neutrophils Relative %: 61 % (ref 43–77)
Platelets: 304 10*3/uL (ref 150–400)
RBC: 4.1 MIL/uL (ref 3.87–5.11)
RDW: 17.4 % — ABNORMAL HIGH (ref 11.5–15.5)
WBC: 7.5 10*3/uL (ref 4.0–10.5)

## 2014-01-06 MED ORDER — AMPHETAMINE-DEXTROAMPHETAMINE 15 MG PO TABS: 7.5000 mg | ORAL_TABLET | Freq: Two times a day (BID) | ORAL | Status: DC

## 2014-01-06 NOTE — Patient Instructions (Signed)
I will get your results back tonight or early tomorrow, and I will release them to you in My Chart as soon as I review them.

## 2014-01-06 NOTE — Progress Notes (Signed)
Subjective:    Patient ID: Lynn Harvey, female    DOB: Jan 26, 1966, 48 y.o.   MRN: [id number]   PCP: Shams Fill, PA-C  Chief Complaint  Patient presents with  . Anemia  . Follow-up  . Medication Refill    adderall    Allergies  Allergen Reactions  . Morphine And Related Nausea And Vomiting    Patient Active Problem List   Diagnosis Date Noted  . CAD S/P LAD DES May 2015 12/17/2013  . HTN (hypertension) 06/25/2013  . Hyperlipidemia 06/25/2013  . Chest pain with moderate risk of acute coronary syndrome 06/24/2013  . NSTEMI (non-ST elevated myocardial infarction) 06/24/2013  . Tobacco abuse 06/10/2012  . Allergic rhinitis 06/10/2012  . ADD (attention deficit disorder) 03/06/2012  . Depression 04/12/2011    Prior to Admission medications   Medication Sig Start Date End Date Taking? Authorizing Provider  ALPRAZolam Prudy Feeler) 0.5 MG tablet Take 0.5 mg by mouth 2 (two) times daily as needed for anxiety or sleep.   Yes Historical Provider, MD  amphetamine-dextroamphetamine (ADDERALL) 15 MG tablet Take 7.5 mg by mouth 2 (two) times daily.   Yes Historical Provider, MD  aspirin EC 81 MG tablet Take 81 mg by mouth daily.   Yes Historical Provider, MD  atorvastatin (LIPITOR) 40 MG tablet Take 1 tablet (40 mg total) by mouth daily. 01/05/14  Yes Tafari Humiston S Thomasine Klutts, PA-C  buPROPion (WELLBUTRIN XL) 150 MG 24 hr tablet Take 300 mg by mouth daily.   Yes Historical Provider, MD  losartan (COZAAR) 50 MG tablet Take 1 tablet (50 mg total) by mouth daily. 08/06/13  Yes [name], MD  metoprolol tartrate (LOPRESSOR) 25 MG tablet Take 25 mg mg in the morning and 25 mg at night 12/18/13  Yes Luke K Kilroy, PA-C  nitroGLYCERIN (NITROSTAT) 0.4 MG SL tablet Place 1 tablet (0.4 mg total) under the tongue every 5 (five) minutes as needed for chest pain. 06/26/13  Yes Myra Rude, MD  acetaminophen (TYLENOL) 325 MG tablet Take 2 tablets (650 mg total) by mouth every 4 (four) hours as needed  for headache or mild pain. 12/18/13   Abelino Derrick, PA-C  iron polysaccharides (NIFEREX) 150 MG capsule Take 1 capsule (150 mg total) by mouth daily. 12/31/13   Elvina Sidle, MD  pantoprazole (PROTONIX) 40 MG tablet Take 1 tablet (40 mg total) by mouth daily at 6 (six) AM. 12/18/13   Abelino Derrick, PA-C  valACYclovir (VALTREX) 500 MG tablet Take 500 mg by mouth 3 (three) times daily as needed (for fever blister).  04/13/13   Historical Provider, MD    Medical, Surgical, Family and Social History reviewed and updated.  HPI  Presents for repeat CBC.   Since AMI in May, her hemoglobin has been drifting down. She's scheduled for upper endoscopy and colonoscopy on 01/08/2014 with Dr. Elnoria Howard. She's very anxious about the drift, concerned that it's an ominous sign and that she may die before the cause is identified and treated. She is concerned that this is an indication that something went wrong during her cath, that she may be bleeding in her heart. She denies abdominal pain, and so doesn't think it's a ulcer.  Her sister, who works as a Clinical biochemist in an neurology office, recommended an SPEP and CA 125. We reviewed her labs over the past several years. Protein and LFTs have remained normal.  After discussion of the specificity and sensitivity of Ca 125, she's not interested in that today.  Their mother died of small cell carcinoma of the lung, originally diagnosed as a pneumonia. She was hospitalized with flu-like symptoms and developed RIGHT side pain, redness. A scan revealed metastatic lesions in the liver. Maternal aunt also had this cancer. Both were former smokers.  "I'm feeling worse."  Headaches, more difficult to concentrate. Last night felt like couldn't catch her breath. "When I get home I don't want to do anything, I just want to go to bed." "I paid my son 5 dollars to clean the litter box." "When I do a lot my chest hurts. Not hurt like a heart attack. Like a soreness." Resolves with rest.  Hasn't tried taking NTG with this pain. Usually also has SOB,"but I'm up doing stuff, too." "I think I'm a lot more aware of everything too, so it probably feels double what it really is." Sees cardiology next week.   Review of Systems     Objective:   Physical Exam  Constitutional: She is oriented to person, place, and time. She appears well-developed and well-nourished. She is active and cooperative. No distress.  BP 128/70 mmHg  Pulse 78  Temp(Src) 97.8 F (36.6 C) (Oral)  Resp 18  Ht 5\' 3"  (1.6 m)  Wt 138 lb (62.596 kg)  BMI 24.45 kg/m2  SpO2 98%   Eyes: Conjunctivae are normal.  Neck: Neck supple. No thyromegaly present.  Cardiovascular: Normal rate, regular rhythm and normal heart sounds.   Pulmonary/Chest: Effort normal and breath sounds normal.  Lymphadenopathy:    She has no cervical adenopathy.  Neurological: She is alert and oriented to person, place, and time.  Skin: Skin is warm and dry.  Psychiatric: Her speech is normal and behavior is normal. Her mood appears anxious. Her affect is not angry, not blunt, not labile and not inappropriate. Cognition and memory are normal. She does not express impulsivity or inappropriate judgment. She exhibits a depressed mood.          Assessment & Plan:  1. Anemia, iron deficiency Our equipment is presently down, so test sent out STAT. Proceed with endoscopies in 2 days with GI as planned. - CBC with Differential  2. ADD (attention deficit disorder) Reduced concentration, but likely due to concerns over anemia. Continue current dose. - amphetamine-dextroamphetamine (ADDERALL) 15 MG tablet; Take 0.5 tablets by mouth 2 (two) times daily.  Dispense: 60 tablet; Refill: 0   Fernande Brashelle S. Ruford Dudzinski, PA-C Physician Assistant-Certified [facility]

## 2014-01-07 ENCOUNTER — Encounter: Payer: Self-pay | Admitting: Physician Assistant

## 2014-01-07 DIAGNOSIS — D649 Anemia, unspecified: Secondary | ICD-10-CM

## 2014-01-08 ENCOUNTER — Encounter (HOSPITAL_COMMUNITY)
Admission: RE | Disposition: A | Discharge status: Home / Self Care | Payer: Self-pay | Source: Ambulatory Visit | Attending: Gastroenterology

## 2014-01-08 ENCOUNTER — Ambulatory Visit (HOSPITAL_COMMUNITY)
Admission: RE | Admit: 2014-01-08 | Discharge: 2014-01-08 | Disposition: A | Discharge status: Home / Self Care | Payer: 59 | Source: Ambulatory Visit | Attending: Gastroenterology | Admitting: Gastroenterology

## 2014-01-08 ENCOUNTER — Encounter (HOSPITAL_COMMUNITY): Payer: Self-pay | Admitting: *Deleted

## 2014-01-08 DIAGNOSIS — I252 Old myocardial infarction: Secondary | ICD-10-CM | POA: Insufficient documentation

## 2014-01-08 DIAGNOSIS — D509 Iron deficiency anemia, unspecified: Secondary | ICD-10-CM | POA: Diagnosis present

## 2014-01-08 HISTORY — PX: COLONOSCOPY: SHX5424

## 2014-01-08 HISTORY — PX: ESOPHAGOGASTRODUODENOSCOPY: SHX5428

## 2014-01-08 SURGERY — EGD (ESOPHAGOGASTRODUODENOSCOPY)
Anesthesia: Moderate Sedation

## 2014-01-08 MED ORDER — MIDAZOLAM HCL 10 MG/2ML IJ SOLN
INTRAMUSCULAR | Status: DC | PRN
  Administered 2014-01-08 (×6): 2.5 mg via INTRAVENOUS

## 2014-01-08 MED ORDER — DIPHENHYDRAMINE HCL 50 MG/ML IJ SOLN
INTRAMUSCULAR | Status: AC
  Filled 2014-01-08: qty 1

## 2014-01-08 MED ORDER — FENTANYL CITRATE 0.05 MG/ML IJ SOLN
INTRAMUSCULAR | Status: AC
  Filled 2014-01-08: qty 4

## 2014-01-08 MED ORDER — BUTAMBEN-TETRACAINE-BENZOCAINE 2-2-14 % EX AERO
INHALATION_SPRAY | CUTANEOUS | Status: DC | PRN
  Administered 2014-01-08: 2 via TOPICAL

## 2014-01-08 MED ORDER — DIPHENHYDRAMINE HCL 50 MG/ML IJ SOLN
INTRAMUSCULAR | Status: DC | PRN
  Administered 2014-01-08: 25 mg via INTRAVENOUS

## 2014-01-08 MED ORDER — MIDAZOLAM HCL 10 MG/2ML IJ SOLN
INTRAMUSCULAR | Status: AC
  Filled 2014-01-08: qty 4

## 2014-01-08 MED ORDER — SODIUM CHLORIDE 0.9 % IV SOLN
INTRAVENOUS | Status: DC
  Administered 2014-01-08: 500 mL via INTRAVENOUS

## 2014-01-08 MED ORDER — FENTANYL CITRATE 0.05 MG/ML IJ SOLN
INTRAMUSCULAR | Status: DC | PRN
  Administered 2014-01-08 (×6): 25 ug via INTRAVENOUS

## 2014-01-08 NOTE — Op Note (Signed)
[facility] [address] [place], [address]   ENDOSCOPY PROCEDURE REPORT  PATIENT: Lynn, Harvey  MR#: [id number] BIRTHDATE: 12-Apr-1965 , 48  yrs. old GENDER: female ENDOSCOPIST:Ambers Iyengar Elnoria Howard, MD REFERRED BY: PROCEDURE DATE:  01/28/2014 PROCEDURE:   EGD w/ biopsy ASA CLASS:    Class III INDICATIONS: Iron Deficiency Anemia MEDICATION: Versed 10 mg IV, Fentanyl 150 mcg IV, and Benadryl 25 mg IV TOPICAL ANESTHETIC:   none  DESCRIPTION OF PROCEDURE:   After the risks and benefits of the procedure were explained, informed consent was obtained.  The endoscope was introduced through the mouth  and advanced to the second portion of the duodenum .  The instrument was slowly withdrawn as the mucosa was fully examined.    FINDINGS: The esophagus and gastroesophageal junction were completely normal in appearance.  The stomach was entered and closely examined.The antrum, angularis, and lesser curvature were well visualized, including a retroflexed view of the cardia and fundus.  The stomach wall was normally distensable.  The scope passed easily through the pylorus into the duodenum.  The mucosa was normal, but cold biopsies were obtained to evaluate for Celiac disease.    Retroflexed views revealed no abnormalities.    The scope was then withdrawn from the patient and the procedure completed.  COMPLICATIONS: There were no immediate complications.  ENDOSCOPIC IMPRESSION: 1) Normal EGD.  RECOMMENDATIONS: 1) Follow up biopsies. 2) Proceed with the colonoscopy.   _______________________________ eSigned:  Jeani Hawking, MD 2014/01/28 3:01 PM     cc:  CPT CODES: ICD CODES:  The ICD and CPT codes recommended by this software are interpretations from the data that the clinical staff has captured with the software.  The verification of the translation of this report to the ICD and CPT codes and modifiers is the sole responsibility of the health care  institution and practicing physician where this report was generated.  [employer] will not be held responsible for the validity of the ICD and CPT codes included on this report.  AMA assumes no liability for data contained or not contained herein. CPT is a Publishing rights manager of the [employer].

## 2014-01-08 NOTE — Interval H&P Note (Signed)
History and Physical Interval Note:  01/08/2014 1:55 PM  Lynn Harvey  has presented today for surgery, with the diagnosis of IDA  The various methods of treatment have been discussed with the patient and family. After consideration of risks, benefits and other options for treatment, the patient has consented to  Procedure(s): ESOPHAGOGASTRODUODENOSCOPY (EGD) (N/A) COLONOSCOPY (N/A) as a surgical intervention .  The patient's history has been reviewed, patient examined, no change in status, stable for surgery.  I have reviewed the patient's chart and labs.  Questions were answered to the patient's satisfaction.     Clotine Heiner D

## 2014-01-08 NOTE — H&P (View-Only) (Signed)
   Subjective:    Patient ID: Lynn Harvey, female    DOB: 08/16/1965, 48 y.o.   MRN: [id number] This chart was scribed for Elvina Sidle, MD by Littie Deeds, Medical Scribe. This patient was seen in Room 26 and the patient's care was started at 11:52 AM.   HPI HPI Comments: Lynn Harvey is a 48 y.o. female with a hx of hemorrhoids, NSTEMI in May 2015 and hysterectomy who presents to the [facility] for a follow-up for an abnormal lab (low ferritin). Patient notes having fatigue. She has not been taking iron supplements, and she does eat meat. Patient denies black stools. Patient was seen in the hospital on 12/17/13 for chest pain, back pain, nausea, face pallor, and near-syncope; she was concerned it might have been another MI. Patient has a FMHx of CAD. She has an upcoming appointment with Dr. Elnoria Howard.  Patient is a Aeronautical engineer.  Review of Systems  Constitutional: Positive for fatigue.  HENT: Negative for ear discharge.   Eyes: Negative for discharge.  Respiratory: Negative for cough.   Gastrointestinal: Negative for diarrhea.  Genitourinary: Negative for hematuria.  Skin: Negative for rash.  Neurological: Negative for seizures.  Psychiatric/Behavioral: Negative for confusion.       Objective:   Physical Exam CONSTITUTIONAL: Well developed/well nourished HEAD: Normocephalic/atraumatic EYES: EOM/PERRL ENMT: Mucous membranes moist NECK: supple no meningeal signs SPINE: entire spine nontender CV: regular LUNGS:  no apparent distress ABDOMEN: soft, nontender, no rebound or guarding GU: no cva tenderness NEURO: Pt is awake/alert, moves all extremitiesx4 EXTREMITIES: pulses normal, full ROM SKIN: warm, color normal PSYCH: no abnormalities of mood noted  Results for orders placed or performed in visit on 12/31/13  POCT CBC  Result Value Ref Range   WBC 5.8 4.6 - 10.2 K/uL   Lymph, poc 2.5 0.6 - 3.4   POC LYMPH PERCENT 42.4 10 - 50 %L   MID  (cbc) 0.2 0 - 0.9   POC MID % 3.2 0 - 12 %M   POC Granulocyte 3.2 2 - 6.9   Granulocyte percent 54.4 37 - 80 %G   RBC 3.86 (A) 4.04 - 5.48 M/uL   Hemoglobin 8.8 (A) 12.2 - 16.2 g/dL   HCT, POC 16.3 (A) 84.5 - 47.9 %   MCV 77.5 (A) 80 - 97 fL   MCH, POC 22.7 (A) 27 - 31.2 pg   MCHC 29.3 (A) 31.8 - 35.4 g/dL   RDW, POC 36.4 %   Platelet Count, POC 311 142 - 424 K/uL   MPV 6.9 0 - 99.8 fL        Assessment & Plan:   Anemia, iron deficiency - Plan: CBC with Differential, Ferritin, Reticulocytes, POCT CBC, iron polysaccharides (NIFEREX) 150 MG capsule  Signed, Elvina Sidle, MD

## 2014-01-08 NOTE — Discharge Instructions (Signed)
Esophagogastroduodenoscopy °Care After °Refer to this sheet in the next few weeks. These instructions provide you with information on caring for yourself after your procedure. Your caregiver may also give you more specific instructions. Your treatment has been planned according to current medical practices, but problems sometimes occur. Call your caregiver if you have any problems or questions after your procedure.  °HOME CARE INSTRUCTIONS °· Do not eat or drink anything until the numbing medicine (local anesthetic) has worn off and your gag reflex has returned. You will know that the local anesthetic has worn off when you can swallow comfortably. °· Do not drive for 12 hours after the procedure or as directed by your caregiver. °· Only take medicines as directed by your caregiver. °SEEK MEDICAL CARE IF:  °· You cannot stop coughing. °· You are not urinating at all or less than usual. °SEEK IMMEDIATE MEDICAL CARE IF: °· You have difficulty swallowing. °· You cannot eat or drink. °· You have worsening throat or chest pain. °· You have dizziness, lightheadedness, or you faint. °· You have nausea or vomiting. °· You have chills. °· You have a fever. °· You have severe abdominal pain. °· You have black, tarry, or bloody stools. °Document Released: 01/23/2012 Document Reviewed: 01/23/2012 °ExitCare® Patient Information ©2015 [employer]. This information is not intended to replace advice given to you by your health care provider. Make sure you discuss any questions you have with your health care provider. ° ° °Colonoscopy, Care After °These instructions give you information on caring for yourself after your procedure. Your doctor may also give you more specific instructions. Call your doctor if you have any problems or questions after your procedure. °HOME CARE °· Do not drive for 24 hours. °· Do not sign important papers or use machinery for 24 hours. °· You may shower. °· You may go back to your usual activities, but  go slower for the first 24 hours. °· Take rest breaks often during the first 24 hours. °· Walk around or use warm packs on your belly (abdomen) if you have belly cramping or gas. °· Drink enough fluids to keep your pee (urine) clear or pale yellow. °· Resume your normal diet. Avoid heavy or fried foods. °· Avoid drinking alcohol for 24 hours or as told by your doctor. °· Only take medicines as told by your doctor. °If a tissue sample (biopsy) was taken during the procedure:  °· Do not take aspirin or blood thinners for 7 days, or as told by your doctor. °· Do not drink alcohol for 7 days, or as told by your doctor. °· Eat soft foods for the first 24 hours. °GET HELP IF: °You still have a small amount of blood in your poop (stool) 2-3 days after the procedure. °GET HELP RIGHT AWAY IF: °· You have more than a small amount of blood in your poop. °· You see clumps of tissue (blood clots) in your poop. °· Your belly is puffy (swollen). °· You feel sick to your stomach (nauseous) or throw up (vomit). °· You have a fever. °· You have belly pain that gets worse and medicine does not help. °MAKE SURE YOU: °· Understand these instructions. °· Will watch your condition. °· Will get help right away if you are not doing well or get worse. °Document Released: 03/10/2010 Document Revised: 02/10/2013 Document Reviewed: 10/13/2012 °ExitCare® Patient Information ©2015 [employer]. This information is not intended to replace advice given to you by your health care provider. Make   sure you discuss any questions you have with your health care provider. ° ° °Conscious Sedation, Adult, Care After °Refer to this sheet in the next few weeks. These instructions provide you with information on caring for yourself after your procedure. Your health care provider may also give you more specific instructions. Your treatment has been planned according to current medical practices, but problems sometimes occur. Call your health care provider if  you have any problems or questions after your procedure. °WHAT TO EXPECT AFTER THE PROCEDURE  °After your procedure: °· You may feel sleepy, clumsy, and have poor balance for several hours. °· Vomiting may occur if you eat too soon after the procedure. °HOME CARE INSTRUCTIONS °· Do not participate in any activities where you could become injured for at least 24 hours. Do not: °¨ Drive. °¨ Swim. °¨ Ride a bicycle. °¨ Operate heavy machinery. °¨ Cook. °¨ Use power tools. °¨ Climb ladders. °¨ Work from a high place. °· Do not make important decisions or sign legal documents until you are improved. °· If you vomit, drink water, juice, or soup when you can drink without vomiting. Make sure you have little or no nausea before eating solid foods. °· Only take over-the-counter or prescription medicines for pain, discomfort, or fever as directed by your health care provider. °· Make sure you and your family fully understand everything about the medicines given to you, including what side effects may occur. °· You should not drink alcohol, take sleeping pills, or take medicines that cause drowsiness for at least 24 hours. °· If you smoke, do not smoke without supervision. °· If you are feeling better, you may resume normal activities 24 hours after you were sedated. °· Keep all appointments with your health care provider. °SEEK MEDICAL CARE IF: °· Your skin is pale or bluish in color. °· You continue to feel nauseous or vomit. °· Your pain is getting worse and is not helped by medicine. °· You have bleeding or swelling. °· You are still sleepy or feeling clumsy after 24 hours. °SEEK IMMEDIATE MEDICAL CARE IF: °· You develop a rash. °· You have difficulty breathing. °· You develop any type of allergic problem. °· You have a fever. °MAKE SURE YOU: °· Understand these instructions. °· Will watch your condition. °· Will get help right away if you are not doing well or get worse. °Document Released: 11/26/2012 Document Reviewed:  11/26/2012 °ExitCare® Patient Information ©2015 [employer]. This information is not intended to replace advice given to you by your health care provider. Make sure you discuss any questions you have with your health care provider. ° °

## 2014-01-08 NOTE — Op Note (Signed)
[facility] [address] [place], [address]   COLONOSCOPY PROCEDURE REPORT  PATIENT: Lynn Harvey, Lynn Harvey  MR#: [id number] BIRTHDATE: 02/01/1966 , 48  yrs. old GENDER: female ENDOSCOPIST: Jeani Hawking, MD REFERRED BY: PROCEDURE DATE:  01-09-2014 PROCEDURE:   Colonoscopy, diagnostic ASA CLASS:   Class III INDICATIONS:Iron Deficiency Anemia MEDICATIONS: See the EGD report  DESCRIPTION OF PROCEDURE:   After the risks and benefits and of the procedure were explained, informed consent was obtained.  revealed no abnormalities of the rectum.    The     endoscope was introduced through the anus and advanced to the cecum, which was identified by both the appendix and ileocecal valve .  The quality of the prep was excellent. .  The instrument was then slowly withdrawn as the colon was fully examined.    FINDINGS: A normal appearing cecum, ileocecal valve, and appendiceal orifice were identified.  The ascending, transverse, descending, sigmoid colon, and rectum appeared unremarkable.     Retroflexed views revealed no abnormalities.     The scope was then withdrawn from the patient and the procedure completed.  WITHDRAWAL TIME:  COMPLICATIONS: There were no immediate complications. ENDOSCOPIC IMPRESSION: 1) Normal colonoscopy RECOMMENDATIONS: 1) Repeat the colonoscopy in 10 years. 2) Outpatient capsule endoscopy.  REPEAT EXAM:  cc:  _______________________________ eSignedJeani Hawking, MD 2014-01-09 3:03 PM   CPT CODES: ICD CODES:  The ICD and CPT codes recommended by this software are interpretations from the data that the clinical staff has captured with the software.  The verification of the translation of this report to the ICD and CPT codes and modifiers is the sole responsibility of the health care institution and practicing physician where this report was generated.  [employer] will not be held responsible for the validity of the  ICD and CPT codes included on this report.  AMA assumes no liability for data contained or not contained herein. CPT is a Publishing rights manager of the [employer].

## 2014-01-10 ENCOUNTER — Ambulatory Visit
Admission: RE | Admit: 2014-01-10 | Discharge: 2014-01-10 | Disposition: A | Discharge status: Home / Self Care | Payer: 59 | Source: Ambulatory Visit | Attending: Specialist | Admitting: Specialist

## 2014-01-10 DIAGNOSIS — M5441 Lumbago with sciatica, right side: Secondary | ICD-10-CM

## 2014-01-10 DIAGNOSIS — M5442 Lumbago with sciatica, left side: Principal | ICD-10-CM

## 2014-01-10 DIAGNOSIS — M542 Cervicalgia: Secondary | ICD-10-CM

## 2014-01-11 ENCOUNTER — Encounter (HOSPITAL_COMMUNITY): Payer: Self-pay | Admitting: Gastroenterology

## 2014-01-12 ENCOUNTER — Ambulatory Visit (INDEPENDENT_AMBULATORY_CARE_PROVIDER_SITE_OTHER): Payer: 59 | Admitting: Cardiovascular Disease

## 2014-01-12 ENCOUNTER — Encounter: Payer: Self-pay | Admitting: Cardiovascular Disease

## 2014-01-12 ENCOUNTER — Telehealth: Payer: Self-pay | Admitting: Hematology

## 2014-01-12 ENCOUNTER — Ambulatory Visit: Payer: 59 | Admitting: Gastroenterology

## 2014-01-12 VITALS — BP 142/76 | HR 62 | Ht 63.0 in | Wt 140.7 lb

## 2014-01-12 DIAGNOSIS — R718 Other abnormality of red blood cells: Secondary | ICD-10-CM

## 2014-01-12 DIAGNOSIS — Z9861 Coronary angioplasty status: Secondary | ICD-10-CM

## 2014-01-12 DIAGNOSIS — I214 Non-ST elevation (NSTEMI) myocardial infarction: Secondary | ICD-10-CM

## 2014-01-12 DIAGNOSIS — D518 Other vitamin B12 deficiency anemias: Secondary | ICD-10-CM

## 2014-01-12 DIAGNOSIS — D509 Iron deficiency anemia, unspecified: Secondary | ICD-10-CM

## 2014-01-12 DIAGNOSIS — I10 Essential (primary) hypertension: Secondary | ICD-10-CM

## 2014-01-12 DIAGNOSIS — F32A Depression, unspecified: Secondary | ICD-10-CM

## 2014-01-12 DIAGNOSIS — R0789 Other chest pain: Secondary | ICD-10-CM

## 2014-01-12 DIAGNOSIS — E785 Hyperlipidemia, unspecified: Secondary | ICD-10-CM

## 2014-01-12 DIAGNOSIS — F988 Other specified behavioral and emotional disorders with onset usually occurring in childhood and adolescence: Secondary | ICD-10-CM

## 2014-01-12 DIAGNOSIS — F329 Major depressive disorder, single episode, unspecified: Secondary | ICD-10-CM

## 2014-01-12 DIAGNOSIS — F909 Attention-deficit hyperactivity disorder, unspecified type: Secondary | ICD-10-CM

## 2014-01-12 DIAGNOSIS — I2510 Atherosclerotic heart disease of native coronary artery without angina pectoris: Secondary | ICD-10-CM

## 2014-01-12 NOTE — Patient Instructions (Signed)
Your physician recommends that you schedule a follow-up appointment in: 4 months or sooner if needed with Dr. Tresa Endo . No changes were made today in your therapy.

## 2014-01-12 NOTE — Progress Notes (Signed)
Patient ID: Lynn Harvey, female   DOB: 1965-04-26, 48 y.o.   MRN: [id number]    HPI: Lynn Harvey is a 48 y.o. female who presents to the office today for a 4 month follow up cardiology evaluation.  Lynn Harvey  presented to [facility] on 06/24/2013 with new onset substernal chest tightness.  She had a 30 year history of tobacco use and had been smoking 2 packs per day.  She ruled in for non-ST segment elevation myocardial infarction.  Cardiac catheterization revealed a 30% stenosis in the proximal LAD prior to the takeoff of the septal perforating artery and diagonal vessel and just beyond this diagonal was 85% LAD stenosis extending up to the diagonal takeoff.  The left circumflex and RCA were normal.  Acute ejection fraction was 50% and there was evidence for moderate anterolateral hypocontractility.  She underwent successful cutting balloon into Angiosculpt with excellent angiographic results.  Residual narrowing was 0%.  For this reason, the artery was not stented, so as not to jail the diagonal and land the proximal portion of a stent in the proximal LAD narrowing.  She was discharged day 2 post MI on a medical regimen consisting of low-dose ACE inhibitor with lisinopril 2.5 mg, very low-dose beta blocker therapy at 12.5 twice a day, in addition to aspirin, Proventil, Lipitor 40 mg.  She has a history of ADHD and apparently her mother died 2 years ago and several days later her son committed suicide.  She also has been on Wellbutrin and takes Alderall. She quit smoking the day of her heart attack.  I saw her in followup on 07/01/2013 at which time she was doing well.  She subsequently developed an episode of recurrent chest pain in a setting of significant anxiety on May 29.  Apparently, she was rehospitalized her primary care physician's office for repeat cardiac catheterization was done by Dr. Sanjuana Kava which showed a patent cutting balloon PCI site without restenosis.  Subsequently, she has  felt well.  She has quit smoking.  She developed an ACE-induced cough secondary to lisinopril and has been able to tolerate losartan.  She denies recent chest pain.  She was started on atorvastatin for hyperlipidemia to her hospitalization.  She has noticed some mild shortness of breath with fast walking, but denies any significant chest tightness.  She recently was rehospitalized with recurrent chest pain on 12/17/2013.  Troponins were negative 3.  She underwent an exercise Myoview study in 12/18/2013 and no ischemia or area of infarction was demonstrated.  Review of her laboratory since her myocardial and infarction indicates the downward drift in her hemoglobin and hematocrit.  In May her hemoglobin was 14 with hematocrit of 40.4.  This had mildly reduced.  Following her initial catheterization and intervention.  However, over the past month, she has had gradual further decline in her hemoglobin and hematocrit, such that on November 12 her hemoglobin was 8.9 and hematocrit 27.7.  Migraines.  She has significant microcytic indices at 71.6 whereas initially, her MCV in May was 91.2.  She's been documented to have a low ferritin level at 10.  She recently underwent a GI evaluation with endoscopy and colonoscopy by Dr. Elnoria Howard and was not told of having significant abnormality.  She will still need to undergo evaluation for small bowel etiology.  She was also found on CT imaging to have a hemorrhagic cyst in her left ovary and will be following up with her OB/GYN physician, Dr. Ellyn Hack.  She presents for  evaluation.  Past Medical History  Diagnosis Date  . Hypertension   . Depression   . Insomnia   . Allergy   . Anxiety   . Rectal abnormality   . Myocardial infarction 06/2013  . Coronary artery disease   . Gestational diabetes     Past Surgical History  Procedure Laterality Date  . Wrist surgery  1987    RIGHT; bone from forearm grafted to wrist  . Abdominal hysterectomy    . Tonsilectomy,  adenoidectomy, bilateral myringotomy and tubes    . Colon surgery      Flexible Sigmoidoscopy  . Esophagogastroduodenoscopy N/A 01/08/2014    Procedure: ESOPHAGOGASTRODUODENOSCOPY (EGD);  Surgeon: Theda Belfast, MD;  Location: [facility];  Service: Endoscopy;  Laterality: N/A;  . Colonoscopy N/A 01/08/2014    Procedure: COLONOSCOPY;  Surgeon: Theda Belfast, MD;  Location: [facility];  Service: Endoscopy;  Laterality: N/A;    Allergies  Allergen Reactions  . Morphine And Related Nausea And Vomiting    Current Outpatient Prescriptions  Medication Sig Dispense Refill  . acetaminophen (TYLENOL) 325 MG tablet Take 2 tablets (650 mg total) by mouth every 4 (four) hours as needed for headache or mild pain.    Marland Kitchen ALPRAZolam (XANAX) 0.5 MG tablet Take 0.5 mg by mouth 2 (two) times daily as needed for anxiety or sleep.    Marland Kitchen amphetamine-dextroamphetamine (ADDERALL) 15 MG tablet Take 0.5 tablets by mouth 2 (two) times daily. 60 tablet 0  . aspirin EC 81 MG tablet Take 81 mg by mouth daily.    Marland Kitchen atorvastatin (LIPITOR) 40 MG tablet Take 1 tablet (40 mg total) by mouth daily. 30 tablet 1  . buPROPion (WELLBUTRIN XL) 150 MG 24 hr tablet Take 300 mg by mouth daily.    Marland Kitchen losartan (COZAAR) 50 MG tablet Take 1 tablet (50 mg total) by mouth daily. 30 tablet 6  . metoprolol tartrate (LOPRESSOR) 25 MG tablet Take 25 mg mg in the morning and 25 mg at night 90 tablet 6  . nitroGLYCERIN (NITROSTAT) 0.4 MG SL tablet Place 1 tablet (0.4 mg total) under the tongue every 5 (five) minutes as needed for chest pain. 10 tablet 12  . pantoprazole (PROTONIX) 40 MG tablet Take 1 tablet (40 mg total) by mouth daily at 6 (six) AM. 30 tablet 11  . valACYclovir (VALTREX) 500 MG tablet Take 500 mg by mouth 3 (three) times daily as needed (for fever blister).      No current facility-administered medications for this visit.    History   Social History  . Marital Status: Married    Spouse Name: [name]    Number of  Children: 3  . Years of Education: 12   Occupational History  . CUSTOMER SERVICE [employer]   Social History Main Topics  . Smoking status: Former Smoker -- 2.00 packs/day for 20 years    Types: Cigarettes    Start date: 12/25/1982  . Smokeless tobacco: Never Used     Comment: quit 06/24/13  . Alcohol Use: 14.4 oz/week    24 Cans of beer per week     Comment: more since fall 2012  . Drug Use: No  . Sexual Activity:    Partners: Male    Birth Control/ Protection: Surgical   Other Topics Concern  . Not on file   Social History Narrative   Lives with her husband, their son [name], and her daughter [name] and Amber's son [name].   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  She quit smoking on 06/24/2013  Family History  Problem Relation Age of Onset  . Cancer Mother   . COPD Mother     emphysema  . Heart disease Father 22    s/p 3V CABG  . Depression Father   . Mental illness Sister     depression  . Depression Sister   . Mental illness Son     ADHD, bipolar disorder  . Diabetes Maternal Grandmother   . Heart disease Maternal Grandmother   . Diabetes Maternal Grandfather   . Cancer Maternal Grandfather     lung cancer  . Diabetes Paternal Grandmother   . Kidney disease Paternal Grandmother   . Diabetes Paternal Grandfather   . Heart disease Paternal Grandfather   . Barrett's esophagus Father     ROS General: Negative; No fevers, chills, or night sweats HEENT: Negative; No changes in vision or hearing, sinus congestion, difficulty swallowing Pulmonary: Positive for mild shortness of breath No cough, wheezing, , hemoptysis Cardiovascular: Se  HPI; No , recurrent chest pain, presyncope, syncope, palpatations GI: Negative; No nausea, vomiting, diarrhea, or abdominal pain GU: Negative; No dysuria, hematuria, or difficulty voiding Musculoskeletal: Negative; no myalgias, joint pain, or weakness Hematologic: Positive for development of iron  deficiency anemia Endocrine: Negative; no heat/cold intolerance; no diabetes, Neuro: Negative; no changes in balance, headaches Skin: Negative; No rashes or skin lesions Psychiatric: Positive for depression, and positive for attention deficit disorder; No behavioral problems,  Sleep: Negative; No snoring,  daytime sleepiness, hypersomnolence, bruxism, restless legs, hypnogognic hallucinations. Other comprehensive 14 point system review is negative    Physical Exam Blood pressure 142/70; pulse 70, respirations 14 General: Alert, oriented, no distress.  Skin: normal turgor, no rashes, warm and dry; multiple tattoos HEENT: Normocephalic, atraumatic. Pupils equal round and reactive to light; sclera anicteric; extraocular muscles intact, No lid lag; Nose without nasal septal hypertrophy; Mouth/Parynx benign; Mallinpatti scale 2 Neck: No JVD, no carotid bruits; normal carotid upstroke Lungs: clear to ausculatation and percussion bilaterally; no wheezing or rales, normal inspiratory and expiratory effort Chest wall: without tenderness to palpitation Heart: PMI not displaced, RRR, s1 s2 normal, faint 1/6 systolic murmur, No diastolic murmur, no rubs, gallops, thrills, or heaves Abdomen: soft, nontender; no hepatosplenomehaly, BS+; abdominal aorta nontender and not dilated by palpation. Back: no CVA tenderness Pulses: 2+  Musculoskeletal: full range of motion, normal strength, no joint deformities Extremities: Pulses 2+, no clubbing cyanosis or edema, Homan's sign negative  Neurologic: grossly nonfocal; Cranial nerves grossly wnl Psychologic: Normal mood and affect   ECG (independently read by me): Normal sinus rhythm at 62 bpm.  No ectopy.  Normal intervals.  No ECG findings of prior MI.  October 2015 ECG (independently read by me): Normal sinus rhythm at 63.  Normal intervals.  Nondiagnostic T change in 3 and V3-V6  08/06/2013 ECG (independently read by me) normal sinus rhythm at 69 beats  per minute.  Nondiagnostic T changes in lead 3.  Prior 07/01/2013 ECG (independently read by me): Normal sinus rhythm at 77 beats per minute.  Mild T-wave inversion V3 through V6.  QTc interval of 390 ms  LABS:  BMET    Component Value Date/Time   NA 142 12/17/2013 2000   K 4.1 12/17/2013 2000   CL 103 12/17/2013 2000   CO2 27 12/17/2013 2000   GLUCOSE 106* 12/17/2013 2000   BUN 9 12/17/2013 2000   CREATININE 0.87 12/17/2013 2000   CREATININE  0.77 07/29/2013 0847   CALCIUM 10.1 12/17/2013 2000   GFRNONAA 79* 12/17/2013 2000   GFRAA >90 12/17/2013 2000     Hepatic Function Panel     Component Value Date/Time   PROT 7.6 12/17/2013 2000   ALBUMIN 4.1 12/17/2013 2000   AST 28 12/17/2013 2000   ALT 32 12/17/2013 2000   ALKPHOS 90 12/17/2013 2000   BILITOT 0.4 12/17/2013 2000     CBC    Component Value Date/Time   WBC 7.5 01/06/2014 1939   WBC 5.8 12/31/2013 1223   RBC 4.10 01/06/2014 1939   RBC 3.86* 12/31/2013 1223   RBC 3.87 12/31/2013 1207   HGB 9.6* 01/06/2014 1939   HGB 8.8* 12/31/2013 1223   HCT 31.2* 01/06/2014 1939   HCT 29.9* 12/31/2013 1223   PLT 304 01/06/2014 1939   MCV 76.1* 01/06/2014 1939   MCV 77.5* 12/31/2013 1223   MCH 23.4* 01/06/2014 1939   MCH 22.7* 12/31/2013 1223   MCHC 30.8 01/06/2014 1939   MCHC 29.3* 12/31/2013 1223   RDW 17.4* 01/06/2014 1939   LYMPHSABS 2.3 01/06/2014 1939   MONOABS 0.6 01/06/2014 1939   EOSABS 0.1 01/06/2014 1939   BASOSABS 0.0 01/06/2014 1939     BNP No results found for: PROBNP  Lipid Panel     Component Value Date/Time   CHOL 119 07/29/2013 0847   TRIG 119 07/29/2013 0847   HDL 37* 07/29/2013 0847   CHOLHDL 3.2 07/29/2013 0847   VLDL 24 07/29/2013 0847   LDLCALC 58 07/29/2013 0847     RADIOLOGY: Dg Chest 2 View  06/24/2013   CLINICAL DATA:  Chest pain.  EXAM: CHEST  2 VIEW  COMPARISON:  DG CHEST 2V dated 04/12/2011  FINDINGS: Mediastinum a.m. hilar structures normal. Lungs are clear. No focal  infiltrate. No pleural effusion or pneumothorax. Heart size normal.  IMPRESSION: No active cardiopulmonary disease.   Electronically Signed   By: Maisie Fushomas  Register   On: 06/24/2013 12:47      ASSESSMENT AND PLAN: Lynn Harvey is a 48 year old female with a greater than 30 year history of tobacco use, who developed a non-ST segment elevation myocardial infarction secondary to subtotal LAD stenosis.  She underwent cardiac catheterization on Jun 24, 2013 and underwent successful percutaneous coronary intervention with cutting balloon angiosculpt with a greater than 85% stenosis being reduced to 0%.  Because of 30% LAD disease proximal to the diagonal and with the excellent results obtained with the cutting balloon, stenting was not done so as not to jail the diagonal 1 and a stent in the proximal LAD stenosis.  She did quit smoking.  She was started on ACE inhibition with lisinopril posthospitalization, but do to cough was switched to an ARB therapy and has tolerated losartan.  She has not been found to have restenosis.  On subsequent catheterization.  Her most recent nuclear perfusion study showed normal perfusion.  When she was admitted with recurrent chest pain on 12/17/2013.  She has developed a progressive anemia and has documented low ferritin level at 10.  I have recommended resumption of Niferex 150 mg daily.  She will need to undergo evaluation of her small bowel.  I'm also scheduling her for hematologic evaluation of her anemia.  She had only been on dual antiplatelet therapy for one month following her MI and has only been maintained on aspirin 81 mg since the PCI result with Angiosculpt scoring balloon was excellent and a stent was not inserted into her LAD, so  as not to jail a large diagonal vessel.  I have personally reviewed her most recent hospitalization and nuclear study.  I will see her in 4 months for cardiology reevaluation or sooner if recurrent problems arise.  Time spent: 25  minutes  [name], MD, [facility]  01/12/2014 10:11 AM

## 2014-01-12 NOTE — Telephone Encounter (Signed)
LEFT MESSAGE FOR PATIENT TO RETURN CALL TO SCHEDULE NP APPT.  °

## 2014-01-13 ENCOUNTER — Telehealth: Payer: Self-pay | Admitting: Hematology

## 2014-01-13 DIAGNOSIS — R718 Other abnormality of red blood cells: Secondary | ICD-10-CM | POA: Insufficient documentation

## 2014-01-13 DIAGNOSIS — D509 Iron deficiency anemia, unspecified: Secondary | ICD-10-CM | POA: Insufficient documentation

## 2014-01-13 NOTE — Telephone Encounter (Signed)
S/W PATIENT AND GAVE NP APPT FOR 12/09 @ 2:30 W/DR. FENG REFERRING DR. [name] [name] DX- ANEMIA  WELCOME PACKET MAILED Toni Amend

## 2014-01-15 ENCOUNTER — Encounter: Payer: Self-pay | Admitting: Physician Assistant

## 2014-01-15 DIAGNOSIS — K219 Gastro-esophageal reflux disease without esophagitis: Secondary | ICD-10-CM

## 2014-01-15 DIAGNOSIS — K640 First degree hemorrhoids: Secondary | ICD-10-CM | POA: Insufficient documentation

## 2014-01-15 DIAGNOSIS — D509 Iron deficiency anemia, unspecified: Secondary | ICD-10-CM

## 2014-01-27 ENCOUNTER — Other Ambulatory Visit (HOSPITAL_BASED_OUTPATIENT_CLINIC_OR_DEPARTMENT_OTHER): Payer: 59

## 2014-01-27 ENCOUNTER — Encounter: Payer: Self-pay | Admitting: Hematology

## 2014-01-27 ENCOUNTER — Ambulatory Visit: Payer: 59

## 2014-01-27 ENCOUNTER — Ambulatory Visit (HOSPITAL_BASED_OUTPATIENT_CLINIC_OR_DEPARTMENT_OTHER): Payer: 59 | Admitting: Hematology

## 2014-01-27 VITALS — BP 120/65 | HR 81 | Resp 18 | Ht 63.0 in | Wt 141.5 lb

## 2014-01-27 DIAGNOSIS — D509 Iron deficiency anemia, unspecified: Secondary | ICD-10-CM

## 2014-01-27 LAB — CBC & DIFF AND RETIC
BASO%: 0.3 % (ref 0.0–2.0)
BASOS ABS: 0 10*3/uL (ref 0.0–0.1)
EOS%: 1 % (ref 0.0–7.0)
Eosinophils Absolute: 0.1 10*3/uL (ref 0.0–0.5)
HCT: 30.3 % — ABNORMAL LOW (ref 34.8–46.6)
HGB: 9.1 g/dL — ABNORMAL LOW (ref 11.6–15.9)
IMMATURE RETIC FRACT: 18.2 % — AB (ref 1.60–10.00)
LYMPH%: 28.6 % (ref 14.0–49.7)
MCH: 22.7 pg — AB (ref 25.1–34.0)
MCHC: 30 g/dL — ABNORMAL LOW (ref 31.5–36.0)
MCV: 75.6 fL — AB (ref 79.5–101.0)
MONO#: 0.7 10*3/uL (ref 0.1–0.9)
MONO%: 9 % (ref 0.0–14.0)
NEUT#: 4.5 10*3/uL (ref 1.5–6.5)
NEUT%: 61.1 % (ref 38.4–76.8)
PLATELETS: 295 10*3/uL (ref 145–400)
RBC: 4.01 10*6/uL (ref 3.70–5.45)
RDW: 18 % — ABNORMAL HIGH (ref 11.2–14.5)
RETIC CT ABS: 65.76 10*3/uL (ref 33.70–90.70)
Retic %: 1.64 % (ref 0.70–2.10)
WBC: 7.4 10*3/uL (ref 3.9–10.3)
lymph#: 2.1 10*3/uL (ref 0.9–3.3)

## 2014-01-27 MED ORDER — FERROUS SULFATE 325 (65 FE) MG PO TBEC: 325.0000 mg | DELAYED_RELEASE_TABLET | Freq: Three times a day (TID) | ORAL | Status: DC

## 2014-01-27 NOTE — Progress Notes (Signed)
[facility]  [phone] [phone]  Clinic New consult Note   Patient Care Team: Fernande Bras, PA-C as PCP - General (Physician Assistant) Sherian Rein, MD as Consulting Physician (Obstetrics and Gynecology) [name], MD as Consulting Physician (Cardiology) Theda Belfast, MD as Consulting Physician (Gastroenterology) Sherian Maroon, PA-C as Physician Assistant (Neurology) 01/27/2014  CHIEF COMPLAINTS/PURPOSE OF CONSULTATION:  Anemia  HISTORY OF PRESENTING ILLNESS:  Lynn Harvey 48 y.o. female is here because of anemia.  She had a heart attack on 06/24/2013. She unwent a PTCA, no stent placement. Her H/H was 14/40% on admission.. No significant bleeding from the procedure. She was put on dual antiplatelet agents  for one month, then continued baby aspirin afterwards. Her hemoglobin started slowly going down since then, and she was found to have abnormal CBC from 12/17/2013 with Hb around 9-11, with low MCV (72-78) and MCHC, with normal WBC and PLT counts. Normal PT/APTT, normal TSH.   She has hysterectomy in 2008. She has ovarian cysts also.  She denied any bleeding episodes including hematochezia, melana, hemoptysis, hematuria or epitaxis. No mucosal bleeding or easy bruising.   She reports exertional chest pain, on a daily base, she had negative cardiac workup including echo and stress test, which were negative. (+) shortness of breath on minimal exertion, pre-syncopal episodes, or palpitations.The patient denies over the counter NSAID ingestion. She is on ASA.  She had no prior history or diagnosis of cancer. Her age appropriate screening programs are up-to-date.She denies any pica and eats a variety of diet.She never donated blood or received blood transfusion. The patient was prescribed oral iron supplements and she takes only 9 pills (from her PCP office) in Sep 2015.   She underwent EGD and colonoscopy on 01/08/2014 which were  all normal. Capsule endoscopy was discussed, she will probably will be scheduled for that.    MEDICAL HISTORY:  Past Medical History  Diagnosis Date  . Hypertension   . Depression   . Insomnia   . Allergy   . Anxiety   . Rectal abnormality   . Myocardial infarction 06/2013  . Coronary artery disease   . Gestational diabetes     SURGICAL HISTORY: Past Surgical History  Procedure Laterality Date  . Wrist surgery  1987    RIGHT; bone from forearm grafted to wrist  . Abdominal hysterectomy    . Tonsilectomy, adenoidectomy, bilateral myringotomy and tubes    . Colon surgery      Flexible Sigmoidoscopy  . Esophagogastroduodenoscopy N/A 01/08/2014    Procedure: ESOPHAGOGASTRODUODENOSCOPY (EGD);  Surgeon: Theda Belfast, MD;  Location: [facility];  Service: Endoscopy;  Laterality: N/A;  . Colonoscopy N/A 01/08/2014    Procedure: COLONOSCOPY;  Surgeon: Theda Belfast, MD;  Location: [facility];  Service: Endoscopy;  Laterality: N/A;    SOCIAL HISTORY: History   Social History  . Marital Status: Married    Spouse Name: Lynn Harvey    Number of Children: 3  . Years of Education: 12   Occupational History  . CUSTOMER SERVICE [employer]   Social History Main Topics  . Smoking status: Former Smoker -- 2.00 packs/day for 20 years    Types: Cigarettes    Start date: 12/25/1982  . Smokeless tobacco: Never Used     Comment: quit 06/24/13  . Alcohol Use: 14.4 oz/week    24 Cans of beer per week     Comment: more since fall 2012  . Drug Use:  No  . Sexual Activity:    Partners: Male    Birth Control/ Protection: Surgical   Other Topics Concern  . Not on file   Social History Narrative   Lives with her husband, their son Lynn Harvey, and her daughter Lynn Harvey and Lynn Harvey's son Lynn Harvey.    FAMILY HISTORY: Family History  Problem Relation Age of Onset  . Cancer Mother   . COPD Mother     emphysema  . Heart disease Father 30    s/p 3V CABG  . Depression Father   . Mental  illness Sister     depression  . Depression Sister   . Mental illness Son     ADHD, bipolar disorder  . Diabetes Maternal Grandmother   . Heart disease Maternal Grandmother   . Diabetes Maternal Grandfather   . Cancer Maternal Grandfather     lung cancer  . Diabetes Paternal Grandmother   . Kidney disease Paternal Grandmother   . Diabetes Paternal Grandfather   . Heart disease Paternal Grandfather   . Barrett's esophagus Father     ALLERGIES:  is allergic to morphine and related.  MEDICATIONS:  Current Outpatient Prescriptions  Medication Sig Dispense Refill  . acetaminophen (TYLENOL) 325 MG tablet Take 2 tablets (650 mg total) by mouth every 4 (four) hours as needed for headache or mild pain.    Marland Kitchen ALPRAZolam (XANAX) 0.5 MG tablet Take 0.5 mg by mouth 2 (two) times daily as needed for anxiety or sleep.    Marland Kitchen amphetamine-dextroamphetamine (ADDERALL) 15 MG tablet Take 0.5 tablets by mouth 2 (two) times daily. 60 tablet 0  . aspirin EC 81 MG tablet Take 81 mg by mouth daily.    Marland Kitchen atorvastatin (LIPITOR) 40 MG tablet Take 1 tablet (40 mg total) by mouth daily. 30 tablet 1  . buPROPion (WELLBUTRIN XL) 150 MG 24 hr tablet Take 300 mg by mouth daily.    Marland Kitchen losartan (COZAAR) 50 MG tablet Take 1 tablet (50 mg total) by mouth daily. 30 tablet 6  . metoprolol tartrate (LOPRESSOR) 25 MG tablet Take 25 mg mg in the morning and 25 mg at night 90 tablet 6  . nitroGLYCERIN (NITROSTAT) 0.4 MG SL tablet Place 1 tablet (0.4 mg total) under the tongue every 5 (five) minutes as needed for chest pain. 10 tablet 12  . pantoprazole (PROTONIX) 40 MG tablet Take 1 tablet (40 mg total) by mouth daily at 6 (six) AM. 30 tablet 11  . valACYclovir (VALTREX) 500 MG tablet Take 500 mg by mouth 3 (three) times daily as needed (for fever blister).  As needed     No current facility-administered medications for this visit.    REVIEW OF SYSTEMS:   Constitutional: Denies fevers, chills or abnormal night sweats, (+)  fatigue  Eyes: Denies blurriness of vision, double vision or watery eyes Ears, nose, mouth, throat, and face: Denies mucositis or sore throat Respiratory: Denies cough, (+) dyspnea on heavy exertion, no wheezes Cardiovascular: (+) palpitation and chest discomfort, no lower extremity swelling Gastrointestinal:  Denies nausea, heartburn or change in bowel habits Skin: Denies abnormal skin rashes Lymphatics: Denies new lymphadenopathy or easy bruising Neurological:Denies numbness, tingling or new weaknesses Behavioral/Psych: Mood is stable, no new changes  All other systems were reviewed with the patient and are negative.  PHYSICAL EXAMINATION: ECOG PERFORMANCE STATUS: 0 - Asymptomatic  Filed Vitals:   01/27/14 1454  BP: 120/65  Pulse: 81  Resp: 18   Filed Weights   01/27/14 1454  Weight:  141 lb 8 oz (64.184 kg)    GENERAL:alert, no distress and comfortable SKIN: skin color, texture, turgor are normal, no rashes or significant lesions EYES: normal, conjunctiva are pink and non-injected, sclera clear OROPHARYNX:no exudate, no erythema and lips, buccal mucosa, and tongue normal  NECK: supple, thyroid normal size, non-tender, without nodularity LYMPH:  no palpable lymphadenopathy in the cervical, axillary or inguinal LUNGS: clear to auscultation and percussion with normal breathing effort HEART: regular rate & rhythm and no murmurs and no lower extremity edema ABDOMEN:abdomen soft, non-tender and normal bowel sounds Musculoskeletal:no cyanosis of digits and no clubbing  PSYCH: alert & oriented x 3 with fluent speech NEURO: no focal motor/sensory deficits  LABORATORY DATA:  I have reviewed the data as listed CBC Latest Ref Rng 01/27/2014 01/06/2014 12/31/2013  WBC 3.9 - 10.3 10e3/uL 7.4 7.5 5.8  Hemoglobin 11.6 - 15.9 g/dL 1.6(X) 0.9(U) 0.4(V)  Hematocrit 34.8 - 46.6 % 30.3(L) 31.2(L) 29.9(A)  Platelets 145 - 400 10e3/uL 295 304 329    CMP Latest Ref Rng 12/17/2013 12/17/2013  07/29/2013  Glucose 70 - 99 mg/dL 409(W) 98 119(J)  BUN 6 - 23 mg/dL 9 10 8   Creatinine 0.50 - 1.10 mg/dL 4.78 2.95 6.21  Sodium 137 - 147 mEq/L 142 137 137  Potassium 3.7 - 5.3 mEq/L 4.1 4.3 4.5  Chloride 96 - 112 mEq/L 103 99 104  CO2 19 - 32 mEq/L 27 25 24   Calcium 8.4 - 10.5 mg/dL 30.8 65.7 9.1  Total Protein 6.0 - 8.3 g/dL 7.6 - 6.7  Total Bilirubin 0.3 - 1.2 mg/dL 0.4 - 0.3  Alkaline Phos 39 - 117 U/L 90 - 67  AST 0 - 37 U/L 28 - 22  ALT 0 - 35 U/L 32 - 26   Results for DALIAH, CHAUDOIN (MRN [id number]) as of 01/27/2014 07:44  Ref. Range 12/17/2013 18:51 12/18/2013 00:55 12/31/2013 12:07  Iron Latest Range: 42-135 ug/dL 68    UIBC Latest Range: 125-400 ug/dL 841 (H)    TIBC Latest Range: 250-470 ug/dL 324 (H)    Saturation Ratios Latest Range: 20-55 % 14 (L)    Ferritin Latest Range: 10-291 ng/mL  9 (L) 10  RBC Folate Latest Range: >280 ng/mL 1386 (H)    Results for CHRISTIE, VISCOMI (MRN [id number]) as of 01/27/2014 07:44  Ref. Range 12/18/2013 00:55  Vitamin B-12 Latest Range: 211-911 pg/mL 1028 (H)   TSH 1.31 on 12/17/2013    RADIOGRAPHIC STUDIES: I have personally reviewed the radiological images as listed and agreed with the findings in the report. Mr Cervical Spine Wo Contrast 01/10/2014    IMPRESSION: 1. Rightward disc herniations at C5-C6 and C6-C7, more pronounced at the former. Mild C5-C6 spinal stenosis and moderate right C6 foraminal stenosis. Mild right C7 foraminal stenosis. 2. Moderate to severe left facet degeneration at C3-C4. Associated moderate left C4 foraminal stenosis.   Electronically Signed   By: Augusto Gamble M.D.   On: 01/10/2014 12:33   Mr Lumbar Spine Wo Contrast 01/10/2014  IMPRESSION: 1. Severe facet arthropathy at L4-L5 in the setting of trace anterolisthesis at that level. Mild L4 foraminal stenosis without significant spinal stenosis. 2. L5-S1 disc degeneration with rightward disc herniation displacing the descending right S1 nerve roots in the lateral  recess.   Electronically Signed   By: Augusto Gamble M.D.   On: 01/10/2014 12:39    ASSESSMENT & PLAN:  48 year old female with past medical history of hypertension and coronary artery disease, anxiety, was found  to have worsening anemia since her cardiac angioplasty in June 2015.   1. Iron deficient anemia -She has microcytic, hypo-productive anemia, with very low ferritin level (9-10), elevated TIBC, low iron saturation, which are all consistent with iron deficiency anemia. -The etiology of iron deficiency is not very clear, she never had overt bleeding, EGD and colonoscopy were completely normal. I recommend her to have a capsule endoscopy to ruled out bleeding from small intestine. Her anemia started after she started with an platelets therapy for her coronary artery disease, she may have slow occluded bleeding. -The other alternative etiology is malabsorption of iron. She does not have any significant history of GI disorder, this is less likely.  -I recommend a trial of oral iron supplement. I given a prescription of iron sulfate 325 mg 3 times a day.  She can take vitamin C along with it to increase absorption. I recommend to check her CBC, reticular count and iron studies in a month's to see if her anemia improves. If she improves she should continue iron supplements for at least 6 months. -Alternatively, if she doesn't respond to by mouth iron, or if she cannot tolerate by mouth iron, I would recommend IV iron. -If her anemia persistent after resolving of her iron deficiency, then I would do further workup to out the etiology of anemia.  She would like to follow-up with her primary care physician for her anemia due to her co-pay issue. So I recommend the following:   1. Iron sulfate 325 mg 3 times a day 2. Repeat CBC, reticular count, iron, TIBC, ferritin in one month 3. Capsule endoscopy will be arranged by her gastroenterologist.  She will call me with her repeat lab results in a month. I'll  be happy to see her again if needed in the future.  All questions were answered. The patient knows to call the clinic with any problems, questions or concerns.  I spent 30 minutes counseling the patient face to face. The total time spent in the appointment was 40 minutes and more than 50% was on counseling.     Malachy MoodFeng, Nafis Farnan, MD 01/27/2014 4:50 PM

## 2014-01-27 NOTE — Addendum Note (Signed)
Addended by: Malachy Mood on: 01/27/2014 05:05 PM   Modules accepted: Level of Service

## 2014-01-27 NOTE — Progress Notes (Signed)
Checked in new patient with no issues prior to seeing the dr. She has appt card and has not been traveling.  °

## 2014-01-28 ENCOUNTER — Encounter (HOSPITAL_COMMUNITY): Payer: Self-pay | Admitting: Cardiovascular Disease

## 2014-02-05 ENCOUNTER — Other Ambulatory Visit: Payer: Self-pay | Admitting: Physician Assistant

## 2014-02-05 ENCOUNTER — Encounter: Payer: Self-pay | Admitting: Physician Assistant

## 2014-02-05 DIAGNOSIS — F988 Other specified behavioral and emotional disorders with onset usually occurring in childhood and adolescence: Secondary | ICD-10-CM

## 2014-02-08 ENCOUNTER — Ambulatory Visit (INDEPENDENT_AMBULATORY_CARE_PROVIDER_SITE_OTHER): Payer: 59 | Admitting: Emergency Medicine

## 2014-02-08 ENCOUNTER — Encounter: Payer: Self-pay | Admitting: Physician Assistant

## 2014-02-08 VITALS — BP 117/68 | HR 63 | Temp 98.0°F | Resp 18 | Wt 142.0 lb

## 2014-02-08 DIAGNOSIS — F988 Other specified behavioral and emotional disorders with onset usually occurring in childhood and adolescence: Secondary | ICD-10-CM

## 2014-02-08 DIAGNOSIS — F909 Attention-deficit hyperactivity disorder, unspecified type: Secondary | ICD-10-CM

## 2014-02-08 DIAGNOSIS — D509 Iron deficiency anemia, unspecified: Secondary | ICD-10-CM

## 2014-02-08 LAB — POCT CBC
Granulocyte percent: 52.4 %G (ref 37–80)
HCT, POC: 32.3 % — AB (ref 37.7–47.9)
HEMOGLOBIN: 10 g/dL — AB (ref 12.2–16.2)
Lymph, poc: 2.5 (ref 0.6–3.4)
MCH: 22.9 pg — AB (ref 27–31.2)
MCHC: 30.9 g/dL — AB (ref 31.8–35.4)
MCV: 74.2 fL — AB (ref 80–97)
MID (cbc): 0.4 (ref 0–0.9)
MPV: 7 fL (ref 0–99.8)
POC Granulocyte: 3.2 (ref 2–6.9)
POC LYMPH PERCENT: 40.8 %L (ref 10–50)
POC MID %: 6.8 %M (ref 0–12)
Platelet Count, POC: 341 10*3/uL (ref 142–424)
RBC: 4.36 M/uL (ref 4.04–5.48)
RDW, POC: 20.4 %
WBC: 6.1 10*3/uL (ref 4.6–10.2)

## 2014-02-08 LAB — IBC PANEL
%SAT: 5 % — ABNORMAL LOW (ref 20–55)
TIBC: 472 ug/dL — AB (ref 250–470)
UIBC: 448 ug/dL — ABNORMAL HIGH (ref 125–400)

## 2014-02-08 LAB — RETICULOCYTES
ABS RETIC: 52.2 10*3/uL (ref 19.0–186.0)
RBC.: 4.35 MIL/uL (ref 3.87–5.11)
RETIC CT PCT: 1.2 % (ref 0.4–2.3)

## 2014-02-08 LAB — IRON: Iron: 24 ug/dL — ABNORMAL LOW (ref 42–145)

## 2014-02-08 MED ORDER — AMPHETAMINE-DEXTROAMPHETAMINE 15 MG PO TABS: 7.5000 mg | ORAL_TABLET | Freq: Two times a day (BID) | ORAL | Status: DC

## 2014-02-08 MED ORDER — ALPRAZOLAM 0.5 MG PO TABS: 0.5000 mg | ORAL_TABLET | Freq: Two times a day (BID) | ORAL | Status: DC | PRN

## 2014-02-08 NOTE — Progress Notes (Signed)
[facility] [address] [place] [address] [phone]  Date:  02/08/2014   Name:  Lynn Harvey   DOB:  1965/08/14   MRN:  [id number]  PCP:  Ellen Goris,[name], PA-C    Chief Complaint: Medication Refill and wants blood checked   History of Present Illness:  Lynn Harvey is a 48 y.o. very pleasant female patient who presents with the following:  Patient under study with iron deficiency anemia scheduled for small bowl camera endoscopy tomorrow.  Feels tired and fatigued in AM and sleepy all day.  Recent onset.  Doesn't snore. No shortness of breath or chest pain. No nausea or vomiting.  No stool change.  No black or bloody stools Here for labs and med refill\ Denies other complaint or health concern today.   Patient Active Problem List   Diagnosis Date Noted  . First degree hemorrhoids 01/15/2014  . GERD without esophagitis 01/15/2014  . Iron deficiency anemia 01/13/2014  . RBC microcytosis 01/13/2014  . CAD S/P LAD DES May 2015 12/17/2013  . HTN (hypertension) 06/25/2013  . Hyperlipidemia 06/25/2013  . Chest pain with moderate risk of acute coronary syndrome 06/24/2013  . NSTEMI (non-ST elevated myocardial infarction) 06/24/2013  . Tobacco abuse 06/10/2012  . Allergic rhinitis 06/10/2012  . ADD (attention deficit disorder) 03/06/2012  . Depression 04/12/2011    Past Medical History  Diagnosis Date  . Hypertension   . Depression   . Insomnia   . Allergy   . Anxiety   . Rectal abnormality   . Myocardial infarction 06/2013  . Coronary artery disease   . Gestational diabetes     Past Surgical History  Procedure Laterality Date  . Wrist surgery  1987    RIGHT; bone from forearm grafted to wrist  . Abdominal hysterectomy    . Tonsilectomy, adenoidectomy, bilateral myringotomy and tubes    . Colon surgery      Flexible Sigmoidoscopy  . Esophagogastroduodenoscopy N/A 01/08/2014    Procedure: ESOPHAGOGASTRODUODENOSCOPY (EGD);  Surgeon:  Theda Belfast, MD;  Location: [facility];  Service: Endoscopy;  Laterality: N/A;  . Colonoscopy N/A 01/08/2014    Procedure: COLONOSCOPY;  Surgeon: Theda Belfast, MD;  Location: [facility];  Service: Endoscopy;  Laterality: N/A;  . Left heart catheterization with coronary angiogram N/A 06/24/2013    Procedure: LEFT HEART CATHETERIZATION WITH CORONARY ANGIOGRAM;  Surgeon: [name], MD;  Location: [facility] CATH LAB;  Service: Cardiovascular;  Laterality: N/A;  . Left heart catheterization with coronary angiogram N/A 07/17/2013    Procedure: LEFT HEART CATHETERIZATION WITH CORONARY ANGIOGRAM;  Surgeon: Kathleene Hazel, MD;  Location: [facility] C[facility] LAB;  Service: Cardiovascular;  Laterality: N/A;    History  Substance Use Topics  . Smoking status: Former Smoker -- 2.00 packs/day for 20 years    Types: Cigarettes    Start date: 12/25/1982  . Smokeless tobacco: Never Used     Comment: quit 06/24/13  . Alcohol Use: 14.4 oz/week    24 Cans of beer per week     Comment: more since fall 2012    Family History  Problem Relation Age of Onset  . Cancer Mother   . COPD Mother     emphysema  . Heart disease Father 18    s/p 3V CABG  . Depression Father   . Mental illness Sister     depression  . Depression Sister   . Mental illness Son     ADHD, bipolar  disorder  . Diabetes Maternal Grandmother   . Heart disease Maternal Grandmother   . Diabetes Maternal Grandfather   . Cancer Maternal Grandfather     lung cancer  . Diabetes Paternal Grandmother   . Kidney disease Paternal Grandmother   . Diabetes Paternal Grandfather   . Heart disease Paternal Grandfather   . Barrett's esophagus Father     Allergies  Allergen Reactions  . Morphine And Related Nausea And Vomiting    Medication list has been reviewed and updated.  Current Outpatient Prescriptions on File Prior to Visit  Medication Sig Dispense Refill  . ALPRAZolam (XANAX) 0.5 MG tablet Take 0.5 mg by mouth 2 (two) times daily  as needed for anxiety or sleep.    Marland Kitchen. amphetamine-dextroamphetamine (ADDERALL) 15 MG tablet Take 0.5 tablets by mouth 2 (two) times daily. 60 tablet 0  . aspirin EC 81 MG tablet Take 81 mg by mouth daily.    Marland Kitchen. atorvastatin (LIPITOR) 40 MG tablet Take 1 tablet (40 mg total) by mouth daily. 30 tablet 1  . buPROPion (WELLBUTRIN XL) 150 MG 24 hr tablet Take 300 mg by mouth daily.    Marland Kitchen. losartan (COZAAR) 50 MG tablet Take 1 tablet (50 mg total) by mouth daily. 30 tablet 6  . Melatonin 5 MG TABS Take 1 tablet by mouth at bedtime as needed.    . metoprolol tartrate (LOPRESSOR) 25 MG tablet Take 25 mg mg in the morning and 25 mg at night 90 tablet 6  . nitroGLYCERIN (NITROSTAT) 0.4 MG SL tablet Place 1 tablet (0.4 mg total) under the tongue every 5 (five) minutes as needed for chest pain. 10 tablet 12  . pantoprazole (PROTONIX) 40 MG tablet Take 1 tablet (40 mg total) by mouth daily at 6 (six) AM. 30 tablet 11  . valACYclovir (VALTREX) 500 MG tablet Take 500 mg by mouth 3 (three) times daily as needed (for fever blister).     . ferrous sulfate 325 (65 FE) MG EC tablet Take 1 tablet (325 mg total) by mouth 3 (three) times daily with meals. (Patient not taking: Reported on 02/08/2014) 90 tablet 3   No current facility-administered medications on file prior to visit.    Review of Systems:  As per HPI, otherwise negative.    Physical Examination: Filed Vitals:   02/08/14 1148  BP: 117/68  Pulse: 63  Temp: 98 F (36.7 C)  Resp: 18   Filed Vitals:   02/08/14 1148  Weight: 142 lb (64.411 kg)   Body mass index is 25.16 kg/(m^2). Ideal Body Weight:     GEN: WDWN, NAD, Non-toxic, Alert & Oriented x 3 HEENT: Atraumatic, Normocephalic.  Ears and Nose: No external deformity. EXTR: No clubbing/cyanosis/edema NEURO: Normal gait.  PSYCH: Normally interactive. Conversant. Not depressed or anxious appearing.  Calm demeanor.    Assessment and Plan: Iron deficiency anemia under  study ADD Anxiety  Signed,  Phillips OdorJeffery Emelio Schneller, MD

## 2014-02-08 NOTE — Patient Instructions (Signed)
Iron Deficiency Anemia Anemia is a condition in which there are less red blood cells or hemoglobin in the blood than normal. Hemoglobin is the part of red blood cells that carries oxygen. Iron deficiency anemia is anemia caused by too little iron. It is the most common type of anemia. It may leave you tired and short of breath. CAUSES   Lack of iron in the diet.  Poor absorption of iron, as seen with intestinal disorders.  Intestinal bleeding.  Heavy periods. SIGNS AND SYMPTOMS  Mild anemia may not be noticeable. Symptoms may include:  Fatigue.  Headache.  Pale skin.  Weakness.  Tiredness.  Shortness of breath.  Dizziness.  Cold hands and feet.  Fast or irregular heartbeat. DIAGNOSIS  Diagnosis requires a thorough evaluation and physical exam by your health care provider. Blood tests are generally used to confirm iron deficiency anemia. Additional tests may be done to find the underlying cause of your anemia. These may include:  Testing for blood in the stool (fecal occult blood test).  A procedure to see inside the colon and rectum (colonoscopy).  A procedure to see inside the esophagus and stomach (endoscopy). TREATMENT  Iron deficiency anemia is treated by correcting the cause of the deficiency. Treatment may involve:  Adding iron-rich foods to your diet.  Taking iron supplements. Pregnant or breastfeeding women need to take extra iron because their normal diet usually does not provide the required amount.  Taking vitamins. Vitamin C improves the absorption of iron. Your health care provider may recommend that you take your iron tablets with a glass of orange juice or vitamin C supplement.  Medicines to make heavy menstrual flow lighter.  Surgery. HOME CARE INSTRUCTIONS   Take iron as directed by your health care provider.  If you cannot tolerate taking iron supplements by mouth, talk to your health care provider about taking them through a vein  (intravenously) or an injection into a muscle.  For the best iron absorption, iron supplements should be taken on an empty stomach. If you cannot tolerate them on an empty stomach, you may need to take them with food.  Do not drink milk or take antacids at the same time as your iron supplements. Milk and antacids may interfere with the absorption of iron.  Iron supplements can cause constipation. Make sure to include fiber in your diet to prevent constipation. A stool softener may also be recommended.  Take vitamins as directed by your health care provider.  Eat a diet rich in iron. Foods high in iron include liver, lean beef, whole-grain bread, eggs, dried fruit, and dark green leafy vegetables. SEEK IMMEDIATE MEDICAL CARE IF:   You faint. If this happens, do not drive. Call your local emergency services (911 in [place] if no other help is available.  You have chest pain.  You feel nauseous or vomit.  You have severe or increased shortness of breath with activity.  You feel weak.  You have a rapid heartbeat.  You have unexplained sweating.  You become light-headed when getting up from a chair or bed. MAKE SURE YOU:   Understand these instructions.  Will watch your condition.  Will get help right away if you are not doing well or get worse. Document Released: 02/03/2000 Document Revised: 02/10/2013 Document Reviewed: 10/13/2012 Exit[employer] Patient Information 2015 [employer]. This information is not intended to replace advice given to you by your health care provider. Make sure you discuss any questions you have with your health care provider.  

## 2014-02-09 LAB — FERRITIN: Ferritin: 8 ng/mL — ABNORMAL LOW (ref 10–291)

## 2014-02-10 MED ORDER — BUPROPION HCL ER (XL) 150 MG PO TB24: 300.0000 mg | ORAL_TABLET | Freq: Every day | ORAL | Status: DC

## 2014-02-10 NOTE — Telephone Encounter (Addendum)
Meds ordered this encounter  Medications  . buPROPion (WELLBUTRIN XL) 150 MG 24 hr tablet    Sig: Take 2 tablets (300 mg total) by mouth daily.    Dispense:  60 tablet    Refill:  5    Order Specific Question:  Supervising Provider    Answer:  DOOLITTLE, ROBERT P [[id number]]   All results already released and viewed by patient.

## 2014-02-10 NOTE — Addendum Note (Signed)
Addended by: Fernande Bras on: 02/10/2014 09:43 AM   Modules accepted: Orders

## 2014-02-17 ENCOUNTER — Encounter: Payer: Self-pay | Admitting: Physician Assistant

## 2014-02-17 DIAGNOSIS — I739 Peripheral vascular disease, unspecified: Principal | ICD-10-CM

## 2014-02-17 DIAGNOSIS — I779 Disorder of arteries and arterioles, unspecified: Secondary | ICD-10-CM | POA: Insufficient documentation

## 2014-03-02 NOTE — Telephone Encounter (Signed)
Old message °

## 2014-03-03 ENCOUNTER — Other Ambulatory Visit: Payer: Self-pay | Admitting: Cardiovascular Disease

## 2014-03-03 ENCOUNTER — Other Ambulatory Visit: Payer: Self-pay | Admitting: Physician Assistant

## 2014-03-03 ENCOUNTER — Other Ambulatory Visit: Payer: Self-pay | Admitting: Emergency Medicine

## 2014-03-10 ENCOUNTER — Telehealth: Payer: Self-pay

## 2014-03-10 ENCOUNTER — Encounter: Payer: Self-pay | Admitting: Physician Assistant

## 2014-03-10 DIAGNOSIS — M545 Low back pain, unspecified: Secondary | ICD-10-CM

## 2014-03-10 DIAGNOSIS — F988 Other specified behavioral and emotional disorders with onset usually occurring in childhood and adolescence: Secondary | ICD-10-CM

## 2014-03-10 NOTE — Telephone Encounter (Signed)
Pt in need of her XANAX 0.5 and her adderall 15mg s. Please call [phone]

## 2014-03-14 MED ORDER — AMPHETAMINE-DEXTROAMPHETAMINE 15 MG PO TABS: 7.5000 mg | ORAL_TABLET | Freq: Two times a day (BID) | ORAL | Status: DC

## 2014-03-14 NOTE — Telephone Encounter (Signed)
I printed the Adderall. It appears that the Alprazolam was done on 1/13, #60, so she should be good with that.  Meds ordered this encounter  Medications  . amphetamine-dextroamphetamine (ADDERALL) 15 MG tablet    Sig: Take 0.5 tablets by mouth 2 (two) times daily.    Dispense:  60 tablet    Refill:  0    Order Specific Question:  Supervising Provider    Answer:  DOOLITTLE, ROBERT P [[id number]]

## 2014-03-14 NOTE — Telephone Encounter (Signed)
Pt does not need xanax. Advised her Adderall script is ready to be picked up. Rx in pick up drawer.

## 2014-03-22 ENCOUNTER — Encounter: Payer: Self-pay | Admitting: Physician Assistant

## 2014-04-14 ENCOUNTER — Telehealth: Payer: Self-pay

## 2014-04-14 ENCOUNTER — Encounter: Payer: Self-pay | Admitting: Physician Assistant

## 2014-04-14 DIAGNOSIS — F988 Other specified behavioral and emotional disorders with onset usually occurring in childhood and adolescence: Secondary | ICD-10-CM

## 2014-04-14 MED ORDER — AMPHETAMINE-DEXTROAMPHETAMINE 15 MG PO TABS: 7.5000 mg | ORAL_TABLET | Freq: Two times a day (BID) | ORAL | Status: DC

## 2014-04-14 MED ORDER — ALPRAZOLAM 0.5 MG PO TABS: ORAL_TABLET | ORAL | Status: DC

## 2014-04-14 NOTE — Telephone Encounter (Signed)
rxs printed.  Meds ordered this encounter  Medications  . ALPRAZolam (XANAX) 0.5 MG tablet    Sig: TAKE ONE TABLET   BY MOUTH   TWICE A DAY   AS NEEDED   FOR ANXIETY    Dispense:  60 tablet    Refill:  0    Order Specific Question:  Supervising Provider    Answer:  DOOLITTLE, ROBERT P [[id number]]  . amphetamine-dextroamphetamine (ADDERALL) 15 MG tablet    Sig: Take 0.5 tablets by mouth 2 (two) times daily.    Dispense:  60 tablet    Refill:  0    Order Specific Question:  Supervising Provider    Answer:  DOOLITTLE, ROBERT P [[id number]]

## 2014-04-14 NOTE — Telephone Encounter (Signed)
Pt is needing refills on ALPRAZolam (XANAX) 0.5 MG tablet [[id number]] and amphetamine-dextroamphetamine (ADDERALL) 15 MG tablet [[id number]]

## 2014-04-15 ENCOUNTER — Telehealth: Payer: Self-pay

## 2014-04-15 ENCOUNTER — Other Ambulatory Visit: Payer: Self-pay | Admitting: Physician Assistant

## 2014-04-15 NOTE — Telephone Encounter (Signed)
Notified pt on VM ready. 

## 2014-04-15 NOTE — Telephone Encounter (Signed)
Rx already printed and signed.

## 2014-04-15 NOTE — Telephone Encounter (Signed)
This is [name]'s patient

## 2014-04-15 NOTE — Telephone Encounter (Signed)
amphetamine-dextroamphetamine (ADDERALL) 15 MG tablet [[id number]]      Order Details    Dose: 7.5 mg Route: Oral Frequency: 2 times daily   Dispense Quantity:  60 tablet Refills:  0 Fills Remaining:  0          Sig: Take 0.5 tablets by mouth 2 (two) times daily.         Written Date:  04/14/14 Expiration Date:  06/13/14     Start Date:  04/14/14 End Date:  --     Ordering Provider:  -- Authorizing Provider:  Fernande Bras, PA-C Ordering User:  Fernande Bras, PA-C          Diagnosis Association: ADD (attention deficit disorder) (314.00)                   Pharmacist called to let us know that for now on we have to write quantity of #30 for this pt's adderall, she only takes a half a pill every day. FYI Dr. Merla Riches.

## 2014-04-15 NOTE — Telephone Encounter (Signed)
Pt is in regularly but don't see lipids done or chol discussed recently. Do you want to give RFs?

## 2014-04-16 NOTE — Telephone Encounter (Signed)
DUPLICATE

## 2014-04-19 MED ORDER — ATORVASTATIN CALCIUM 40 MG PO TABS: ORAL_TABLET | ORAL | Status: DC

## 2014-04-27 ENCOUNTER — Encounter (HOSPITAL_COMMUNITY): Payer: 59

## 2014-05-17 MED ORDER — ALPRAZOLAM 0.5 MG PO TABS: 0.5000 mg | ORAL_TABLET | Freq: Every evening | ORAL | Status: DC | PRN

## 2014-05-17 MED ORDER — ALPRAZOLAM 0.5 MG PO TBDP: 0.5000 mg | ORAL_TABLET | Freq: Two times a day (BID) | ORAL | Status: DC | PRN

## 2014-05-17 MED ORDER — ALPRAZOLAM 0.5 MG PO TABS: ORAL_TABLET | ORAL | Status: DC

## 2014-05-17 MED ORDER — AMPHETAMINE-DEXTROAMPHETAMINE 15 MG PO TABS: 7.5000 mg | ORAL_TABLET | Freq: Two times a day (BID) | ORAL | Status: AC

## 2014-05-17 MED ORDER — AMPHETAMINE-DEXTROAMPHETAMINE 15 MG PO TABS: 7.5000 mg | ORAL_TABLET | Freq: Two times a day (BID) | ORAL | Status: DC

## 2014-05-17 MED ORDER — ALPRAZOLAM 0.5 MG PO TABS: 0.5000 mg | ORAL_TABLET | Freq: Two times a day (BID) | ORAL | Status: DC | PRN

## 2014-05-17 NOTE — Telephone Encounter (Signed)
Patient notified via My Chart

## 2014-05-18 ENCOUNTER — Ambulatory Visit (INDEPENDENT_AMBULATORY_CARE_PROVIDER_SITE_OTHER): Payer: 59 | Admitting: Emergency Medicine

## 2014-05-18 VITALS — BP 122/70 | HR 70 | Temp 97.5°F | Resp 16 | Ht 64.5 in | Wt 147.0 lb

## 2014-05-18 DIAGNOSIS — D509 Iron deficiency anemia, unspecified: Secondary | ICD-10-CM | POA: Diagnosis not present

## 2014-05-18 DIAGNOSIS — Z9114 Patient's other noncompliance with medication regimen: Secondary | ICD-10-CM

## 2014-05-18 LAB — POCT CBC
Granulocyte percent: 51.4 %G (ref 37–80)
HEMATOCRIT: 35.1 % — AB (ref 37.7–47.9)
Hemoglobin: 10.6 g/dL — AB (ref 12.2–16.2)
Lymph, poc: 2.3 (ref 0.6–3.4)
MCH, POC: 26.4 pg — AB (ref 27–31.2)
MCHC: 30.4 g/dL — AB (ref 31.8–35.4)
MCV: 87 fL (ref 80–97)
MID (cbc): 0.4 (ref 0–0.9)
MPV: 6.4 fL (ref 0–99.8)
PLATELET COUNT, POC: 282 10*3/uL (ref 142–424)
POC GRANULOCYTE: 2.9 (ref 2–6.9)
POC LYMPH PERCENT: 41.1 %L (ref 10–50)
POC MID %: 7.5 %M (ref 0–12)
RBC: 4.03 M/uL — AB (ref 4.04–5.48)
RDW, POC: 18.1 %
WBC: 5.7 10*3/uL (ref 4.6–10.2)

## 2014-05-18 LAB — FERRITIN: FERRITIN: 13 ng/mL (ref 10–291)

## 2014-05-18 LAB — IRON AND TIBC
%SAT: 9 % — ABNORMAL LOW (ref 20–55)
Iron: 40 ug/dL — ABNORMAL LOW (ref 42–145)
TIBC: 427 ug/dL (ref 250–470)
UIBC: 387 ug/dL (ref 125–400)

## 2014-05-18 NOTE — Progress Notes (Signed)
[facility] [address], [place] [address] [phone]  Date:  05/18/2014   Name:  Lynn Harvey   DOB:  [birth year]   MRN:  [id number]  PCP:  Advait Buice,[name], PA-C    Chief Complaint: Anemia and Fatigue   History of Present Illness:  Lynn Harvey is a 49 y.o. very pleasant female patient who presents with the following:  Patient has history of iron def anemia that is under investigation. She is not compliant with her iron replacement, stating she is not going to take iron "for the rest of my life" Complains of side effect of anemia. Has increased fatigue and weakness over past several weeks. Was to have labs repeated but did not accomplish that as she has "seen too many doctors" No visible blood loss or bruising. No chest pain or shortness of breath No improvement with over the counter medications or other home remedies.  Denies other complaint or health concern today.   Patient Active Problem List   Diagnosis Date Noted  . Low back pain radiating to both legs 03/10/2014  . Bilateral carotid artery disease 02/17/2014  . First degree hemorrhoids 01/15/2014  . GERD without esophagitis 01/15/2014  . Iron deficiency anemia 01/13/2014  . RBC microcytosis 01/13/2014  . CAD S/P LAD DES May 2015 12/17/2013  . HTN (hypertension) 06/25/2013  . Hyperlipidemia 06/25/2013  . Chest pain with moderate risk of acute coronary syndrome 06/24/2013  . NSTEMI (non-ST elevated myocardial infarction) 06/24/2013  . Tobacco abuse 06/10/2012  . Allergic rhinitis 06/10/2012  . ADD (attention deficit disorder) 03/06/2012  . Depression 04/12/2011    Past Medical History  Diagnosis Date  . Hypertension   . Depression   . Insomnia   . Allergy   . Anxiety   . Rectal abnormality   . Myocardial infarction 06/2013  . Coronary artery disease   . Gestational diabetes     Past Surgical History  Procedure Laterality Date  . Wrist surgery  1987    RIGHT; bone from  forearm grafted to wrist  . Abdominal hysterectomy    . Tonsilectomy, adenoidectomy, bilateral myringotomy and tubes    . Colon surgery      Flexible Sigmoidoscopy  . Esophagogastroduodenoscopy N/A 01/08/2014    Procedure: ESOPHAGOGASTRODUODENOSCOPY (EGD);  Surgeon: Theda Belfast, MD;  Location: [facility];  Service: Endoscopy;  Laterality: N/A;  . Colonoscopy N/A 01/08/2014    Procedure: COLONOSCOPY;  Surgeon: Theda Belfast, MD;  Location: [facility] ENDOSCOPY;  Service: Endoscopy;  Laterality: N/A;  . Left heart catheterization with coronary angiogram N/A 06/24/2013    Procedure: LEFT HEART CATHETERIZATION WITH CORONARY ANGIOGRAM;  Surgeon: [name], MD;  Location: [facility] CATH LAB;  Service: Cardiovascular;  Laterality: N/A;  . Left heart catheterization with coronary angiogram N/A 07/17/2013    Procedure: LEFT HEART CATHETERIZATION WITH CORONARY ANGIOGRAM;  Surgeon: Kathleene Hazel, MD;  Location: [facility] C[facility] LAB;  Service: Cardiovascular;  Laterality: N/A;    History  Substance Use Topics  . Smoking status: Former Smoker -- 2.00 packs/day for 20 years    Types: Cigarettes    Start date: 12/25/1982  . Smokeless tobacco: Never Used     Comment: quit 06/24/13  . Alcohol Use: 14.4 oz/week    24 Cans of beer per week     Comment: more since fall 2012    Family History  Problem Relation Age of Onset  . Cancer Mother   . COPD Mother  emphysema  . Heart disease Father 26    s/p 3V CABG  . Depression Father   . Mental illness Sister     depression  . Depression Sister   . Mental illness Son     ADHD, bipolar disorder  . Diabetes Maternal Grandmother   . Heart disease Maternal Grandmother   . Diabetes Maternal Grandfather   . Cancer Maternal Grandfather     lung cancer  . Diabetes Paternal Grandmother   . Kidney disease Paternal Grandmother   . Diabetes Paternal Grandfather   . Heart disease Paternal Grandfather   . Barrett's esophagus Father     Allergies  Allergen  Reactions  . Morphine And Related Nausea And Vomiting    Medication list has been reviewed and updated.  Current Outpatient Prescriptions on File Prior to Visit  Medication Sig Dispense Refill  . ALPRAZolam (XANAX) 0.5 MG tablet TAKE ONE TABLET   BY MOUTH   TWICE A DAY   AS NEEDED   FOR ANXIETY 60 tablet 0  . amphetamine-dextroamphetamine (ADDERALL) 15 MG tablet Take 0.5 tablets by mouth 2 (two) times daily. 60 tablet 0  . aspirin EC 81 MG tablet Take 81 mg by mouth daily.    Marland Kitchen atorvastatin (LIPITOR) 40 MG tablet TAKE 1 TABLET (40 MG TOTAL) BY MOUTH DAILY 30 tablet 2  . buPROPion (WELLBUTRIN XL) 150 MG 24 hr tablet Take 2 tablets (300 mg total) by mouth daily. 60 tablet 5  . losartan (COZAAR) 50 MG tablet TAKE 1 TABLET (50 MG TOTAL) BY MOUTH DAILY. 30 tablet 6  . Melatonin 5 MG TABS Take 1 tablet by mouth at bedtime as needed.    . metoprolol tartrate (LOPRESSOR) 25 MG tablet Take 25 mg mg in the morning and 25 mg at night 90 tablet 6  . nitroGLYCERIN (NITROSTAT) 0.4 MG SL tablet Place 1 tablet (0.4 mg total) under the tongue every 5 (five) minutes as needed for chest pain. 10 tablet 12  . ferrous sulfate 325 (65 FE) MG EC tablet Take 1 tablet (325 mg total) by mouth 3 (three) times daily with meals. (Patient not taking: Reported on 02/08/2014) 90 tablet 3  . pantoprazole (PROTONIX) 40 MG tablet Take 1 tablet (40 mg total) by mouth daily at 6 (six) AM. (Patient not taking: Reported on 05/18/2014) 30 tablet 11  . valACYclovir (VALTREX) 500 MG tablet Take 500 mg by mouth 3 (three) times daily as needed (for fever blister).      No current facility-administered medications on file prior to visit.    Review of Systems:  As per HPI, otherwise negative.    Physical Examination: Filed Vitals:   05/18/14 1124  BP: 122/70  Pulse: 70  Temp: 97.5 F (36.4 C)  Resp: 16   Filed Vitals:   05/18/14 1124  Height: 5' 4.5" (1.638 m)  Weight: 147 lb (66.679 kg)   Body mass index is 24.85  kg/(m^2). Ideal Body Weight: Weight in (lb) to have BMI = 25: 147.6   GEN: WDWN, NAD, Non-toxic, Alert & Oriented x 3 HEENT: Atraumatic, Normocephalic.  Ears and Nose: No external deformity. EXTR: No clubbing/cyanosis/edema NEURO: Normal gait.  PSYCH: Normally interactive. Conversant. Not depressed or anxious appearing.  Calm demeanor.  No bruising.  Assessment and Plan: Iron deficiency anemia Labs pending Noncompliance  Signed,  Phillips Odor, MD   Results for orders placed or performed in visit on 05/18/14  POCT CBC  Result Value Ref Range   WBC 5.7 4.6 -  10.2 K/uL   Lymph, poc 2.3 0.6 - 3.4   POC LYMPH PERCENT 41.1 10 - 50 %L   MID (cbc) 0.4 0 - 0.9   POC MID % 7.5 0 - 12 %M   POC Granulocyte 2.9 2 - 6.9   Granulocyte percent 51.4 37 - 80 %G   RBC 4.03 (A) 4.04 - 5.48 M/uL   Hemoglobin 10.6 (A) 12.2 - 16.2 g/dL   HCT, POC 16.1 (A) 09.6 - 47.9 %   MCV 87.0 80 - 97 fL   MCH, POC 26.4 (A) 27 - 31.2 pg   MCHC 30.4 (A) 31.8 - 35.4 g/dL   RDW, POC 04.5 %   Platelet Count, POC 282 142 - 424 K/uL   MPV 6.4 0 - 99.8 fL

## 2014-05-19 LAB — RETICULOCYTES
ABS RETIC: 85.3 10*3/uL (ref 19.0–186.0)
RBC.: 4.06 MIL/uL (ref 3.87–5.11)
RETIC CT PCT: 2.1 % (ref 0.4–2.3)

## 2014-06-10 ENCOUNTER — Telehealth: Payer: Self-pay | Admitting: Cardiovascular Disease

## 2014-06-10 NOTE — Telephone Encounter (Signed)
Pt had a heart attack last May. She is getting ready to join a gym,they want to know id she have any limitations?

## 2014-06-10 NOTE — Telephone Encounter (Signed)
Pt asking about any limits on exercise r/t cardiac history. Please advise.

## 2014-06-14 NOTE — Telephone Encounter (Signed)
She has been stable post MI; ok to exercise without restrictions

## 2014-06-14 NOTE — Telephone Encounter (Signed)
Informed of Dr. Landry Dyke advice. Pt voiced understanding.

## 2014-07-10 ENCOUNTER — Other Ambulatory Visit: Payer: Self-pay | Admitting: Physician Assistant

## 2014-07-12 NOTE — Telephone Encounter (Signed)
Can we refill? 

## 2014-07-20 ENCOUNTER — Other Ambulatory Visit: Payer: Self-pay | Admitting: Physician Assistant

## 2014-08-13 ENCOUNTER — Other Ambulatory Visit: Payer: Self-pay | Admitting: Physician Assistant

## 2014-08-13 NOTE — Telephone Encounter (Signed)
Rx faxed

## 2014-09-03 ENCOUNTER — Other Ambulatory Visit: Payer: Self-pay | Admitting: Physician Assistant

## 2014-09-03 NOTE — Telephone Encounter (Signed)
Last Rxd 6/24.

## 2014-09-13 ENCOUNTER — Other Ambulatory Visit: Payer: Self-pay | Admitting: Physician Assistant

## 2014-10-04 ENCOUNTER — Ambulatory Visit: Payer: Self-pay | Admitting: Physician Assistant

## 2014-10-05 ENCOUNTER — Other Ambulatory Visit: Payer: Self-pay | Admitting: Cardiovascular Disease

## 2014-10-05 NOTE — Telephone Encounter (Signed)
Rx(s) sent to pharmacy electronically.  

## 2014-10-28 ENCOUNTER — Other Ambulatory Visit: Payer: Self-pay | Admitting: Cardiology

## 2014-11-18 ENCOUNTER — Other Ambulatory Visit: Payer: Self-pay | Admitting: Cardiovascular Disease

## 2014-11-18 NOTE — Telephone Encounter (Signed)
Rx(s) sent to pharmacy electronically.  

## 2014-12-03 ENCOUNTER — Ambulatory Visit (INDEPENDENT_AMBULATORY_CARE_PROVIDER_SITE_OTHER): Payer: 59 | Admitting: Urgent Care

## 2014-12-03 VITALS — BP 112/62 | HR 85 | Temp 99.0°F | Resp 18 | Ht 64.5 in | Wt 148.0 lb

## 2014-12-03 DIAGNOSIS — R059 Cough, unspecified: Secondary | ICD-10-CM

## 2014-12-03 DIAGNOSIS — B349 Viral infection, unspecified: Secondary | ICD-10-CM | POA: Diagnosis not present

## 2014-12-03 DIAGNOSIS — J029 Acute pharyngitis, unspecified: Secondary | ICD-10-CM

## 2014-12-03 DIAGNOSIS — R05 Cough: Secondary | ICD-10-CM | POA: Diagnosis not present

## 2014-12-03 MED ORDER — HYDROCODONE-HOMATROPINE 5-1.5 MG/5ML PO SYRP: 5.0000 mL | ORAL_SOLUTION | Freq: Every evening | ORAL | Status: DC | PRN

## 2014-12-03 NOTE — Patient Instructions (Signed)
Viral Infections °A viral infection can be caused by different types of viruses. Most viral infections are not serious and resolve on their own. However, some infections may cause severe symptoms and may lead to further complications. °SYMPTOMS °Viruses can frequently cause: °· Minor sore throat. °· Aches and pains. °· Headaches. °· Runny nose. °· Different types of rashes. °· Watery eyes. °· Tiredness. °· Cough. °· Loss of appetite. °· Gastrointestinal infections, resulting in nausea, vomiting, and diarrhea. °These symptoms do not respond to antibiotics because the infection is not caused by bacteria. However, you might catch a bacterial infection following the viral infection. This is sometimes called a "superinfection." Symptoms of such a bacterial infection may include: °· Worsening sore throat with pus and difficulty swallowing. °· Swollen neck glands. °· Chills and a high or persistent fever. °· Severe headache. °· Tenderness over the sinuses. °· Persistent overall ill feeling (malaise), muscle aches, and tiredness (fatigue). °· Persistent cough. °· Yellow, green, or brown mucus production with coughing. °HOME CARE INSTRUCTIONS  °· Only take over-the-counter or prescription medicines for pain, discomfort, diarrhea, or fever as directed by your caregiver. °· Drink enough water and fluids to keep your urine clear or pale yellow. Sports drinks can provide valuable electrolytes, sugars, and hydration. °· Get plenty of rest and maintain proper nutrition. Soups and broths with crackers or rice are fine. °SEEK IMMEDIATE MEDICAL CARE IF:  °· You have severe headaches, shortness of breath, chest pain, neck pain, or an unusual rash. °· You have uncontrolled vomiting, diarrhea, or you are unable to keep down fluids. °· You or your child has an oral temperature above 102° F (38.9° C), not controlled by medicine. °· Your baby is older than 3 months with a rectal temperature of 102° F (38.9° C) or higher. °· Your baby is 3  months old or younger with a rectal temperature of 100.4° F (38° C) or higher. °MAKE SURE YOU:  °· Understand these instructions. °· Will watch your condition. °· Will get help right away if you are not doing well or get worse. °  °This information is not intended to replace advice given to you by your health care provider. Make sure you discuss any questions you have with your health care provider. °  °Document Released: 11/15/2004 Document Revised: 04/30/2011 Document Reviewed: 07/14/2014 °Elsevier Interactive Patient Education ©2016 [employer] ° °

## 2014-12-03 NOTE — Progress Notes (Signed)
    MRN: [id number] DOB: 11-Sep-1965  Subjective:   Lynn Harvey is a 49 y.o. female with pmh of heart disease, MI (06/24/2013) presenting for chief complaint of Sore Throat; Otalgia; Headache; Chest hurts; and Cough  Reports 2 day history of worsening sore throat, productive cough elicits chest pain, difficulty breathing due to cough, hoarseness, subjective fever, lymph node pain that radiates to ears bilaterally. Has tried NyQuil with some relief. Denies shob, wheezing, n/v, abdominal pain, tooth pain, sinus congestion, sinus pain, ear pain, ear drainage, itchy or red eyes. Admits history of seasonal allergies, takes Zyrtec as needed. Denies history of asthma. Currently, non-smoker for past 1.5 years. Has 40 pack year history. Denies any other aggravating or relieving factors, no other questions or concerns.  Lynn Harvey has a current medication list which includes the following prescription(s): alprazolam, amphetamine-dextroamphetamine, aspirin ec, atorvastatin, atorvastatin, bupropion, ferrous sulfate, losartan, melatonin, metoprolol tartrate, nitroglycerin, and valacyclovir. Also is allergic to morphine and related.  Lynn Harvey  has a past medical history of Hypertension; Depression; Insomnia; Allergy; Anxiety; Rectal abnormality; Myocardial infarction [facility]) (06/2013); Coronary artery disease; and Gestational diabetes. Also  has past surgical history that includes Wrist surgery (1987); Abdominal hysterectomy; Tonsilectomy, adenoidectomy, bilateral myringotomy and tubes; Colon surgery; Esophagogastroduodenoscopy (N/A, 01/08/2014); Colonoscopy (N/A, 01/08/2014); left heart catheterization with coronary angiogram (N/A, 06/24/2013); and left heart catheterization with coronary angiogram (N/A, 07/17/2013).  Objective:   Vitals: BP 112/62 mmHg  Pulse 85  Temp(Src) 99 F (37.2 C) (Oral)  Resp 18  Ht 5' 4.5" (1.638 m)  Wt 148 lb (67.132 kg)  BMI 25.02 kg/m2  SpO2 94%  Physical Exam  Constitutional: She  is oriented to person, place, and time. She appears well-developed and well-nourished.  HENT:  TM's intact bilaterally, no effusions or erythema. Nares patent, nasal turbinates pink and moist, nasal passages patent. No sinus tenderness. Oropharynx clear, mucous membranes moist, dentition in good repair.  Eyes: Pupils are equal, round, and reactive to light. Right eye exhibits no discharge. Left eye exhibits no discharge. No scleral icterus.  Neck: Normal range of motion. Neck supple.  Cardiovascular: Normal rate, regular rhythm and intact distal pulses.  Exam reveals no gallop and no friction rub.   No murmur heard. Pulmonary/Chest: No respiratory distress. She has no wheezes. She has no rales.  Musculoskeletal: She exhibits no edema.  Lymphadenopathy:    She has cervical adenopathy (anterior, L>R).  Neurological: She is alert and oriented to person, place, and time.  Skin: Skin is warm and dry. No rash noted. No erythema. No pallor.   Assessment and Plan :   1. Viral syndrome 2. Cough 3. Sore throat - Due to smoking history and acute onset of symptoms, I offered patient chest x-ray which she declined. Recommended supportive care, return to clinic for reevaluation on Monday if she has no improvement. Hycodan and Delsym for cough, Cepacol for sore throat.  Wallis Bamberg, PA-C [facility] [phone] 12/03/2014 3:49 PM

## 2014-12-08 ENCOUNTER — Ambulatory Visit (INDEPENDENT_AMBULATORY_CARE_PROVIDER_SITE_OTHER): Payer: 59 | Admitting: Physician Assistant

## 2014-12-08 VITALS — BP 126/68 | HR 73 | Temp 99.2°F | Resp 16 | Ht 63.5 in | Wt 146.0 lb

## 2014-12-08 DIAGNOSIS — J209 Acute bronchitis, unspecified: Secondary | ICD-10-CM

## 2014-12-08 MED ORDER — AZITHROMYCIN 250 MG PO TABS: ORAL_TABLET | ORAL | Status: DC

## 2014-12-08 NOTE — Progress Notes (Signed)
Subjective:    Patient ID: Lynn Harvey, female    DOB: [birth year], 49 y.o.   MRN: [id number]  Chief Complaint  Patient presents with  . Follow-up    Still coughing and ST    HPI Patient presents for re-evaluation of sore throat and productive cough x 1 week. She was last seen on 12/03/14 by Gurney Maxin, at which time supportive care with Hycodan, Delsym, and Cepacol were prescribed. Since that time, her throat pain has improved, however her hoarseness and cough productivity have worsened. She reports green sputum when she coughs and subjective low-grade fevers of 99-100 at home. She also endorses some mild neck tenderness and swelling. Patient received her flu shot in August. She denies ear pain, sinus pain or head congestion, HAs, rhinorrhea, chest pain, SOB, or wheezing. Husband and 37 yo son are now coughing at home. No other sick contacts. No other concerns on today's visit.   Review of Systems  Constitutional: Positive for fever (low-grade, 99-100 degrees). Negative for chills.  HENT: Positive for sore throat and trouble swallowing (occasional). Negative for congestion, ear discharge, ear pain, facial swelling, rhinorrhea, sinus pressure and sneezing.    Patient Active Problem List   Diagnosis Date Noted  . Low back pain radiating to both legs 03/10/2014  . Bilateral carotid artery disease (HCC) 02/17/2014  . First degree hemorrhoids 01/15/2014  . GERD without esophagitis 01/15/2014  . Iron deficiency anemia 01/13/2014  . RBC microcytosis 01/13/2014  . CAD S/P LAD DES May 2015 12/17/2013  . HTN (hypertension) 06/25/2013  . Hyperlipidemia 06/25/2013  . Chest pain with moderate risk of acute coronary syndrome 06/24/2013  . NSTEMI (non-ST elevated myocardial infarction) (HCC) 06/24/2013  . Tobacco abuse 06/10/2012  . Allergic rhinitis 06/10/2012  . ADD (attention deficit disorder) 03/06/2012  . Depression 04/12/2011   Prior to Admission medications   Medication Sig Start  Date End Date Taking? Authorizing Provider  amphetamine-dextroamphetamine (ADDERALL) 15 MG tablet Take 0.5 tablets by mouth 2 (two) times daily. 05/17/14  Yes [name] Jeffery, PA-C  aspirin EC 81 MG tablet Take 81 mg by mouth daily.   Yes Historical Provider, MD  atorvastatin (LIPITOR) 40 MG tablet TAKE 1 TABLET (40 MG TOTAL) BY MOUTH DAILY 07/12/14  Yes Tishira R Brewington, PA-C  buPROPion (WELLBUTRIN XL) 150 MG 24 hr tablet Take 2 tablets (300 mg total) by mouth daily. 02/10/14  Yes [name] Jeffery, PA-C  ferrous sulfate 325 (65 FE) MG EC tablet Take 1 tablet (325 mg total) by mouth 3 (three) times daily with meals. 01/27/14  Yes Malachy Mood, MD  HYDROcodone-homatropine [facility]) 5-1.5 MG/5ML syrup Take 5 mLs by mouth at bedtime as needed. 12/03/14  Yes Wallis Bamberg, PA-C  losartan (COZAAR) 50 MG tablet Take 1 tablet (50 mg total) by mouth daily. 11/18/14  Yes [name], MD  metoprolol tartrate (LOPRESSOR) 25 MG tablet Take 1 tablet (25 mg total) by mouth 2 (two) times daily. Needs appointment for future refills 10/28/14  Yes [name], MD  nitroGLYCERIN (NITROSTAT) 0.4 MG SL tablet Place 1 tablet (0.4 mg total) under the tongue every 5 (five) minutes as needed for chest pain. 06/26/13  Yes Myra Rude, MD  valACYclovir (VALTREX) 500 MG tablet Take 500 mg by mouth 3 (three) times daily as needed (for fever blister).  04/13/13  Yes Historical Provider, MD  azithromycin (ZITHROMAX) 250 MG tablet Take 2 tabs PO x 1 dose, then 1 tab PO QD x 4 days 12/08/14  Porfirio Oar, PA-C  zolpidem (AMBIEN) 5 MG tablet  12/07/14   Historical Provider, MD   Allergies  Allergen Reactions  . Morphine And Related Nausea And Vomiting   Patient's family and social history reviewed, no new changes.     Objective:   Physical Exam  Constitutional: She is oriented to person, place, and time. She appears well-developed and well-nourished. No distress.  BP 126/68 mmHg  Pulse 73  Temp(Src) 99.2 F (37.3 C) (Oral)   Resp 16  Ht 5' 3.5" (1.613 m)  Wt 146 lb (66.225 kg)  BMI 25.45 kg/m2  SpO2 96%  HENT:  Head: Normocephalic and atraumatic.  Eyes: Conjunctivae and EOM are normal. No scleral icterus.  Neck: Neck supple. No JVD present. No thyromegaly present.  Cardiovascular: Normal rate, regular rhythm, normal heart sounds and intact distal pulses.  Exam reveals no gallop and no friction rub.   No murmur heard. Pulmonary/Chest: Effort normal and breath sounds normal. No respiratory distress. She has no wheezes. She has no rales.  Lymphadenopathy:    She has no cervical adenopathy.  Neurological: She is alert and oriented to person, place, and time.  Skin: Skin is warm and dry. No rash noted. No erythema.  Psychiatric: She has a normal mood and affect. Her behavior is normal. Judgment and thought content normal.      Assessment & Plan:  1. Acute bronchitis, unspecified organism - Due to duration of patient's symptoms and failed treatment with supportive care alone, suspect infectious etiology. Instructed patient to complete full regimen of antibiotics to successfully treat illness.  - azithromycin (ZITHROMAX) 250 MG tablet; Take 2 tabs PO x 1 dose, then 1 tab PO QD x 4 days  Dispense: 6 tablet; Refill: 0

## 2014-12-08 NOTE — Progress Notes (Signed)
Patient ID: Lynn Harvey, female    DOB: [birth year], 49 y.o.   MRN: [id number]  PCP: Olene Floss  Subjective:   Chief Complaint  Patient presents with  . Follow-up    Still coughing and ST    HPI Patient presents for re-evaluation of sore throat and productive cough x 1 week.   She was last seen on 12/03/14 here, at which time supportive care with Hycodan, Delsym, and Cepacol were prescribed. Since that time, her throat pain has improved, however her hoarseness and cough productivity have worsened. She reports green sputum when she coughs and subjective low-grade fevers of 99-100 at home. She also endorses some mild neck tenderness and swelling.   Patient received her flu shot in August.   She denies ear pain, sinus pain or head congestion, HAs, rhinorrhea, chest pain, SOB, or wheezing. Husband and 38 yo son are now coughing at home. No other sick contacts. No other concerns on today's visit.    Review of Systems Constitutional: Positive for fever (low-grade, 99-100 degrees). Negative for chills.  HENT: Positive for sore throat and trouble swallowing (occasional). Negative for congestion, ear discharge, ear pain, facial swelling, rhinorrhea, sinus pressure and sneezing.      Patient Active Problem List   Diagnosis Date Noted  . Low back pain radiating to both legs 03/10/2014  . Bilateral carotid artery disease (HCC) 02/17/2014  . First degree hemorrhoids 01/15/2014  . GERD without esophagitis 01/15/2014  . Iron deficiency anemia 01/13/2014  . RBC microcytosis 01/13/2014  . CAD S/P LAD DES May 2015 12/17/2013  . HTN (hypertension) 06/25/2013  . Hyperlipidemia 06/25/2013  . Chest pain with moderate risk of acute coronary syndrome 06/24/2013  . NSTEMI (non-ST elevated myocardial infarction) (HCC) 06/24/2013  . Tobacco abuse 06/10/2012  . Allergic rhinitis 06/10/2012  . ADD (attention deficit disorder) 03/06/2012  . Depression 04/12/2011     Prior to  Admission medications   Medication Sig Start Date End Date Taking? Authorizing Provider  amphetamine-dextroamphetamine (ADDERALL) 15 MG tablet Take 0.5 tablets by mouth 2 (two) times daily. 05/17/14  Yes Alyana Kreiter, PA-C  aspirin EC 81 MG tablet Take 81 mg by mouth daily.   Yes Historical Provider, MD  atorvastatin (LIPITOR) 40 MG tablet TAKE 1 TABLET (40 MG TOTAL) BY MOUTH DAILY 07/12/14  Yes Tishira R Brewington, PA-C  buPROPion (WELLBUTRIN XL) 150 MG 24 hr tablet Take 2 tablets (300 mg total) by mouth daily. 02/10/14  Yes Matti Minney, PA-C  ferrous sulfate 325 (65 FE) MG EC tablet Take 1 tablet (325 mg total) by mouth 3 (three) times daily with meals. 01/27/14  Yes Malachy Mood, MD  HYDROcodone-homatropine [facility]) 5-1.5 MG/5ML syrup Take 5 mLs by mouth at bedtime as needed. 12/03/14  Yes Wallis Bamberg, PA-C  losartan (COZAAR) 50 MG tablet Take 1 tablet (50 mg total) by mouth daily. 11/18/14  Yes [name], MD  metoprolol tartrate (LOPRESSOR) 25 MG tablet Take 1 tablet (25 mg total) by mouth 2 (two) times daily. Needs appointment for future refills 10/28/14  Yes [name], MD  nitroGLYCERIN (NITROSTAT) 0.4 MG SL tablet Place 1 tablet (0.4 mg total) under the tongue every 5 (five) minutes as needed for chest pain. 06/26/13  Yes Myra Rude, MD  valACYclovir (VALTREX) 500 MG tablet Take 500 mg by mouth 3 (three) times daily as needed (for fever blister).  04/13/13  Yes Historical Provider, MD  zolpidem (AMBIEN) 5 MG tablet  12/07/14   Historical  Provider, MD     Allergies  Allergen Reactions  . Morphine And Related Nausea And Vomiting       Objective:  Physical Exam  Constitutional: She is oriented to person, place, and time. Vital signs are normal. She appears well-developed and well-nourished. She is active and cooperative. No distress.  BP 126/68 mmHg  Pulse 73  Temp(Src) 99.2 F (37.3 C) (Oral)  Resp 16  Ht 5' 3.5" (1.613 m)  Wt 146 lb (66.225 kg)  BMI 25.45 kg/m2  SpO2  96%  HENT:  Head: Normocephalic and atraumatic.  Right Ear: Hearing normal.  Left Ear: Hearing normal.  Eyes: Conjunctivae are normal. No scleral icterus.  Neck: Normal range of motion. Neck supple. No thyromegaly present.  Cardiovascular: Normal rate, regular rhythm and normal heart sounds.   Pulses:      Radial pulses are 2+ on the right side, and 2+ on the left side.  Pulmonary/Chest: Effort normal and breath sounds normal.  Lymphadenopathy:       Head (right side): No tonsillar, no preauricular, no posterior auricular and no occipital adenopathy present.       Head (left side): No tonsillar, no preauricular, no posterior auricular and no occipital adenopathy present.    She has no cervical adenopathy.       Right: No supraclavicular adenopathy present.       Left: No supraclavicular adenopathy present.  Neurological: She is alert and oriented to person, place, and time. No sensory deficit.  Skin: Skin is warm, dry and intact. No rash noted. No cyanosis or erythema. Nails show no clubbing.  Psychiatric: She has a normal mood and affect.           Assessment & Plan:   1. Acute bronchitis, unspecified organism Add antibiotic for possible bacterial cause of bronchitis. Continue supportive care. Anticipatory guidance provided. If symptoms persist, plan CXR. - azithromycin (ZITHROMAX) 250 MG tablet; Take 2 tabs PO x 1 dose, then 1 tab PO QD x 4 days  Dispense: 6 tablet; Refill: 0   Fernande Bras, PA-C Physician Assistant-Certified [facility]

## 2014-12-08 NOTE — Patient Instructions (Signed)
Get plenty of rest and drink at least 64 ounces of water daily. 

## 2014-12-27 ENCOUNTER — Ambulatory Visit: Payer: 59 | Admitting: Cardiovascular Disease

## 2015-01-10 ENCOUNTER — Other Ambulatory Visit: Payer: Self-pay | Admitting: Cardiovascular Disease

## 2015-01-17 ENCOUNTER — Encounter: Payer: Self-pay | Admitting: Physician Assistant

## 2015-01-17 ENCOUNTER — Ambulatory Visit (INDEPENDENT_AMBULATORY_CARE_PROVIDER_SITE_OTHER): Payer: 59 | Admitting: Physician Assistant

## 2015-01-17 VITALS — BP 130/80 | HR 64 | Ht 63.0 in | Wt 146.9 lb

## 2015-01-17 DIAGNOSIS — I10 Essential (primary) hypertension: Secondary | ICD-10-CM | POA: Diagnosis not present

## 2015-01-17 DIAGNOSIS — I2510 Atherosclerotic heart disease of native coronary artery without angina pectoris: Secondary | ICD-10-CM

## 2015-01-17 DIAGNOSIS — I779 Disorder of arteries and arterioles, unspecified: Secondary | ICD-10-CM

## 2015-01-17 DIAGNOSIS — Z9861 Coronary angioplasty status: Secondary | ICD-10-CM

## 2015-01-17 DIAGNOSIS — I739 Peripheral vascular disease, unspecified: Secondary | ICD-10-CM

## 2015-01-17 DIAGNOSIS — E785 Hyperlipidemia, unspecified: Secondary | ICD-10-CM

## 2015-01-17 NOTE — Progress Notes (Signed)
Patient ID: Lynn Harvey, female   DOB: Aug 15, 1965, 49 y.o.   MRN: [id number]    Date:  01/17/2015   ID:  Lynn Harvey, DOB 1965-05-13, MRN [id number]  PCP:  Porfirio Oar PA-C  Primary Cardiologist:  Tresa Endo  Chief Complaint  Patient presents with  . Follow-up    6 month rov. patient reports having SOB. Edema in legs.     History of Present Illness: Lynn Harvey is a 49 y.o. female  The patient is a 49 year old female with a history of coronary artery disease, myocardial infarction, hypertension, back abuse, depression, anxiety, gestational diabetes.  She presented to [facility] on 06/24/2013 with new onset substernal chest tightness. She had a 30 year history of tobacco use and had been smoking 2 packs per day. She ruled in for non-ST segment elevation myocardial infarction. Cardiac catheterization revealed a 30% stenosis in the proximal LAD prior to the takeoff of the septal perforating artery and diagonal vessel and just beyond this diagonal was 85% LAD stenosis extending up to the diagonal takeoff. The left circumflex and RCA were normal. Ejection fraction was 50% and there was evidence for moderate anterolateral hypocontractility. She underwent successful cutting balloon and Angiosculpt with excellent angiographic results. Residual narrowing was 0%. For this reason, the artery was not stented, so as not to jail the diagonal and land the proximal portion of a stent in the proximal LAD narrowing. She was discharged day 2 post MI on a medical regimen consisting of low-dose ACE inhibitor with lisinopril 2.5 mg, very low-dose beta blocker therapy at 12.5 twice a day, in addition to aspirin, Proventil, Lipitor 40 mg. She has a history of ADHD and apparently her mother died 3 years ago and several days later her son committed suicide. She also has been on Wellbutrin and takes Alderall. She quit smoking the day of her heart attack.  Dr. Tresa Endo saw her in followup on 07/01/2013 at  which time she was doing well. She subsequently developed an episode of recurrent chest pain in a setting of significant anxiety on May 29. Apparently, she was rehospitalized her primary care physician's office.  A repeat cardiac catheterization was done by Dr. Sanjuana Kava which showed a patent cutting balloon PCI site without restenosis. Subsequently, she has felt well. She developed an ACE-induced cough secondary to lisinopril and has been able to tolerate losartan.  She denies recent chest pain. She was started on atorvastatin for hyperlipidemia.  She was rehospitalized with recurrent chest pain on 12/17/2013. Troponins were negative 3. She underwent an exercise Myoview study in 12/18/2013 and no ischemia or area of infarction was demonstrated.  Her laboratory since her myocardial and infarction indicates the downward drift in her hemoglobin and hematocrit. In May her hemoglobin was 14 with hematocrit of 40.4. This had mildly reduced. Following her initial catheterization and intervention. However, over the past month, she has had gradual further decline in her hemoglobin and hematocrit, such that on November 12 her hemoglobin was 8.9 and hematocrit 27.7. Migraines. She has significant microcytic indices at 71.6 whereas initially, her MCV in May was 91.2. She's been documented to have a low ferritin level at 10. She  underwent a GI evaluation with endoscopy and colonoscopy by Dr. Elnoria Howard and was not told of having significant abnormality.  She was also found on CT imaging to have a hemorrhagic cyst in her left ovary.  Patient presents for six-month follow-up. Overall she is doing well. She had carotid Dopplers December 2015 which apparently showed  a 40% blockage.  She gets dyspneic with exertion. She is not regularly exercising.  She is taking iron but does not like taking it. She also reports some lower extremity edema. She otherwise denies nausea, vomiting, fever, chest pain, shortness of  breath at rest, orthopnea, dizziness, PND, cough, congestion, abdominal pain, hematochezia, melena,  claudication.  Wt Readings from Last 3 Encounters:  01/17/15 146 lb 14.4 oz (66.633 kg)  12/08/14 146 lb (66.225 kg)  12/03/14 148 lb (67.132 kg)     Past Medical History  Diagnosis Date  . Hypertension   . Depression   . Insomnia   . Allergy   . Anxiety   . Rectal abnormality   . Myocardial infarction (HCC) 06/2013  . Coronary artery disease   . Gestational diabetes     Current Outpatient Prescriptions  Medication Sig Dispense Refill  . amphetamine-dextroamphetamine (ADDERALL) 15 MG tablet Take 0.5 tablets by mouth 2 (two) times daily. 60 tablet 0  . aspirin EC 81 MG tablet Take 81 mg by mouth daily.    Marland Kitchen atorvastatin (LIPITOR) 40 MG tablet TAKE 1 TABLET (40 MG TOTAL) BY MOUTH DAILY 30 tablet 0  . buPROPion (WELLBUTRIN XL) 150 MG 24 hr tablet Take 2 tablets (300 mg total) by mouth daily. 60 tablet 5  . ferrous sulfate 325 (65 FE) MG EC tablet Take 1 tablet (325 mg total) by mouth 3 (three) times daily with meals. 90 tablet 3  . losartan (COZAAR) 50 MG tablet Take 1 tablet (50 mg total) by mouth daily. 30 tablet 1  . metoprolol tartrate (LOPRESSOR) 25 MG tablet Take 1 tablet (25 mg total) by mouth 2 (two) times daily. 60 tablet 0  . nitroGLYCERIN (NITROSTAT) 0.4 MG SL tablet Place 1 tablet (0.4 mg total) under the tongue every 5 (five) minutes as needed for chest pain. 10 tablet 12  . valACYclovir (VALTREX) 500 MG tablet Take 500 mg by mouth 3 (three) times daily as needed (for fever blister).     Marland Kitchen zolpidem (AMBIEN) 5 MG tablet      No current facility-administered medications for this visit.    Allergies:    Allergies  Allergen Reactions  . Morphine And Related Nausea And Vomiting    Social History:  The patient  reports that she has quit smoking. Her smoking use included Cigarettes. She started smoking about 32 years ago. She has a 40 pack-year smoking history. She has  never used smokeless tobacco. She reports that she drinks about 14.4 oz of alcohol per week. She reports that she does not use illicit drugs.   Family history:   Family History  Problem Relation Age of Onset  . Cancer Mother   . COPD Mother     emphysema  . Heart disease Father 12    s/p 3V CABG  . Depression Father   . Mental illness Sister     depression  . Depression Sister   . Mental illness Son     ADHD, bipolar disorder  . Diabetes Maternal Grandmother   . Heart disease Maternal Grandmother   . Diabetes Maternal Grandfather   . Cancer Maternal Grandfather     lung cancer  . Diabetes Paternal Grandmother   . Kidney disease Paternal Grandmother   . Diabetes Paternal Grandfather   . Heart disease Paternal Grandfather   . Barrett's esophagus Father     ROS:  Please see the history of present illness.  All other systems reviewed and negative.   PHYSICAL  EXAM: VS:  BP 130/80 mmHg  Pulse 64  Ht  (1.6 m)  Wt 146 lb 14.4 oz (66.633 kg)  BMI 26.03 kg/m2 Well nourished, well developed, in no acute distress HEENT: Pupils are equal round react to light accommodation extraocular movements are intact.  Neck: no JVDNo cervical lymphadenopathy. No carotid bruits Cardiac: Regular rate and rhythm without murmurs rubs or gallops. Lungs:  clear to auscultation bilaterally, no wheezing, rhonchi or rales Abd: soft, nontender, positive bowel sounds all quadrants, no hepatosplenomegaly Ext: no lower extremity edema.  2+ radial and dorsalis pedis pulses. Skin: warm and dry Neuro:  Grossly normal  EKG:  Rate 64 bpm  ASSESSMENT AND PLAN:  Problem List Items Addressed This Visit    Hyperlipidemia (Chronic)   HTN (hypertension) (Chronic)   Relevant Orders   EKG 12-Lead   CAD S/P LAD DES May 2015 - Primary   Relevant Orders   EKG 12-Lead   Bilateral carotid artery disease (HCC)     Coronary artery disease No complaints of angina. Continue aspirin, metoprolol and  statin.  Essential hypertension Blood pressure controlled. Continue beta blocker and ARB.  Mildly overweight We discussed nutrition as well as exercise in detail.  Dyspnea on exertion  This is likely related to deconditioning. Her last hemoglobin was 9.7. Recommended she start exercising.  Carotid artery disease:   This was incidentally found due to some neck pain she was experiencing. Recommend periodic carotid Dopplers. She does not have any bruits on exam.

## 2015-01-17 NOTE — Patient Instructions (Signed)
Your physician wants you to follow-up in: 6 months or sooner if needed with Dr Kelly.You will receive a reminder letter in the mail two months in advance. If you don't receive a letter, please call our office to schedule the follow-up appointment.  If you need a refill on your cardiac medications before your next appointment, please call your pharmacy. 

## 2015-01-27 ENCOUNTER — Other Ambulatory Visit: Payer: Self-pay

## 2015-01-27 MED ORDER — ATORVASTATIN CALCIUM 40 MG PO TABS: ORAL_TABLET | ORAL | Status: DC

## 2015-02-10 ENCOUNTER — Other Ambulatory Visit: Payer: Self-pay | Admitting: Cardiovascular Disease

## 2015-02-10 NOTE — Telephone Encounter (Signed)
REFILL 

## 2015-02-18 ENCOUNTER — Ambulatory Visit (INDEPENDENT_AMBULATORY_CARE_PROVIDER_SITE_OTHER): Payer: 59

## 2015-02-18 ENCOUNTER — Ambulatory Visit (INDEPENDENT_AMBULATORY_CARE_PROVIDER_SITE_OTHER): Payer: 59 | Admitting: Family Medicine

## 2015-02-18 VITALS — BP 122/80 | HR 90 | Temp 98.0°F | Resp 17 | Ht 63.0 in | Wt 147.0 lb

## 2015-02-18 DIAGNOSIS — M25422 Effusion, left elbow: Secondary | ICD-10-CM | POA: Diagnosis not present

## 2015-02-18 DIAGNOSIS — M25522 Pain in left elbow: Secondary | ICD-10-CM | POA: Diagnosis not present

## 2015-02-18 DIAGNOSIS — M79632 Pain in left forearm: Secondary | ICD-10-CM

## 2015-02-18 NOTE — Progress Notes (Signed)
PROCEDURE NOTE: Long Arm Splint Verbal consent obtained. Applied a 3" x 17" long arm posterior splint to patient's left elbow. Arm sling applied. Patient tolerated procedure well.  Wallis Bamberg, PA-C [facility] [phone] 02/18/2015  8:44 PM

## 2015-02-18 NOTE — Progress Notes (Signed)
Chief Complaint:  Chief Complaint  Patient presents with  . Elbow Pain    left     HPI: Lynn Harvey is a 49 y.o. female who reports to [facility] today complaining of let elbow pain, she fell last night in kitchen directly on elbow  Her son who is 12 was wrestling with her in the kitchen, fell on tile floor , she has sharp shooting pain at the the elbow with ROM, she has taken Aleve without relief. No prior injuries. Right hand dominant. Deneis n/w/t.   Past Medical History  Diagnosis Date  . Hypertension   . Depression   . Insomnia   . Allergy   . Anxiety   . Rectal abnormality   . Myocardial infarction (HCC) 06/2013  . Coronary artery disease   . Gestational diabetes    Past Surgical History  Procedure Laterality Date  . Wrist surgery  1987    RIGHT; bone from forearm grafted to wrist  . Abdominal hysterectomy    . Tonsilectomy, adenoidectomy, bilateral myringotomy and tubes    . Colon surgery      Flexible Sigmoidoscopy  . Esophagogastroduodenoscopy N/A 01/08/2014    Procedure: ESOPHAGOGASTRODUODENOSCOPY (EGD);  Surgeon: Theda Belfast, MD;  Location: [facility];  Service: Endoscopy;  Laterality: N/A;  . Colonoscopy N/A 01/08/2014    Procedure: COLONOSCOPY;  Surgeon: Theda Belfast, MD;  Location: [facility] ENDOSCOPY;  Service: Endoscopy;  Laterality: N/A;  . Left heart catheterization with coronary angiogram N/A 06/24/2013    Procedure: LEFT HEART CATHETERIZATION WITH CORONARY ANGIOGRAM;  Surgeon: [name], MD;  Location: [facility] C[facility] LAB;  Service: Cardiovascular;  Laterality: N/A;  . Left heart catheterization with coronary angiogram N/A 07/17/2013    Procedure: LEFT HEART CATHETERIZATION WITH CORONARY ANGIOGRAM;  Surgeon: Kathleene Hazel, MD;  Location: [facility] CATH LAB;  Service: Cardiovascular;  Laterality: N/A;   Social History   Social History  . Marital Status: Married    Spouse Name: [name]  . Number of Children: 3  . Years of Education: 12   Occupational  History  . CUSTOMER SERVICE [employer]   Social History Main Topics  . Smoking status: Former Smoker -- 2.00 packs/day for 20 years    Types: Cigarettes    Start date: 12/25/1982  . Smokeless tobacco: Never Used     Comment: quit 06/24/13  . Alcohol Use: 14.4 oz/week    24 Cans of beer per week     Comment: more since fall 2012  . Drug Use: No  . Sexual Activity:    Partners: Male    Birth Control/ Protection: Surgical   Other Topics Concern  . None   Social History Narrative   Lives with her husband, their son [name], and her daughter [name] and Amber's son [name].   Family History  Problem Relation Age of Onset  . Cancer Mother   . COPD Mother     emphysema  . Heart disease Father 53    s/p 3V CABG  . Depression Father   . Mental illness Sister     depression  . Depression Sister   . Mental illness Son     ADHD, bipolar disorder  . Diabetes Maternal Grandmother   . Heart disease Maternal Grandmother   . Diabetes Maternal Grandfather   . Cancer Maternal Grandfather     lung cancer  . Diabetes Paternal Grandmother   . Kidney disease Paternal Grandmother   . Diabetes Paternal  Grandfather   . Heart disease Paternal Grandfather   . Barrett's esophagus Father    Allergies  Allergen Reactions  . Morphine And Related Nausea And Vomiting   Prior to Admission medications   Medication Sig Start Date End Date Taking? Authorizing Provider  amphetamine-dextroamphetamine (ADDERALL) 15 MG tablet Take 0.5 tablets by mouth 2 (two) times daily. 05/17/14  Yes [name] Jeffery, PA-C  aspirin EC 81 MG tablet Take 81 mg by mouth daily.   Yes Historical Provider, MD  atorvastatin (LIPITOR) 40 MG tablet Take 1 tablet by mouth daily. PATIENT NEEDS OFFICE VISIT/ FASTING LABS FOR ADDITIONAL REFILLS 01/27/15  Yes Wallis Bamberg, PA-C  buPROPion (WELLBUTRIN XL) 150 MG 24 hr tablet Take 2 tablets (300 mg total) by mouth daily. 02/10/14  Yes [name] Jeffery, PA-C  losartan (COZAAR) 50 MG  tablet Take 1 tablet (50 mg total) by mouth daily. 11/18/14  Yes [name], MD  metoprolol tartrate (LOPRESSOR) 25 MG tablet TAKE 1 TABLET (25 MG TOTAL) BY MOUTH TWO   (TWO) TIMES DAILY. 02/10/15  Yes [name], MD  nitroGLYCERIN (NITROSTAT) 0.4 MG SL tablet Place 1 tablet (0.4 mg total) under the tongue every 5 (five) minutes as needed for chest pain. 06/26/13  Yes Myra Rude, MD  valACYclovir (VALTREX) 500 MG tablet Take 500 mg by mouth 3 (three) times daily as needed (for fever blister).  04/13/13  Yes Historical Provider, MD  zolpidem (AMBIEN) 5 MG tablet  12/07/14  Yes Historical Provider, MD  ferrous sulfate 325 (65 FE) MG EC tablet Take 1 tablet (325 mg total) by mouth 3 (three) times daily with meals. Patient not taking: Reported on 02/18/2015 01/27/14   Malachy Mood, MD     ROS: The patient denies fevers, chills, night sweats, unintentional weight loss, chest pain, palpitations, wheezing, dyspnea on exertion, nausea, vomiting, abdominal pain, dysuria, hematuria, melena,numbness, weakness, or tingling.   All other systems have been reviewed and were otherwise negative with the exception of those mentioned in the HPI and as above.    PHYSICAL EXAM: Filed Vitals:   02/18/15 1427  BP: 122/80  Pulse: 90  Temp: 98 F (36.7 C)  Resp: 17   Body mass index is 26.05 kg/(m^2).   General: Alert, no acute distress HEENT:  Normocephalic, atraumatic, oropharynx patent. EOMI, PERRLA Cardiovascular:  Regular rate and rhythm, no rubs murmurs or gallops.  No Carotid bruits, radial pulse intact. No pedal edema.  Respiratory: Clear to auscultation bilaterally.  No wheezes, rales, or rhonchi.  No cyanosis, no use of accessory musculature Abdominal: No organomegaly, abdomen is soft and non-tender, positive bowel sounds. No masses. Skin: No rashes. Neurologic: Facial musculature symmetric. Psychiatric: Patient acts appropriately throughout our interaction. Lymphatic: No cervical or  submandibular lymphadenopathy Musculoskeletal: Gait intact. Left humerus nl exam + tenderness and edema at left elbow olceranon, poor exam due to pain in ROM of IR , ER, pain with supination adn pronation + forearm tenderness 5/5 grip strength   LABS: Results for orders placed or performed in visit on 05/18/14  Reticulocytes  Result Value Ref Range   Retic Ct Pct 2.1 0.4 - 2.3 %   RBC. 4.06 3.87 - 5.11 MIL/uL   ABS Retic 85.3 19.0 - 186.0 K/uL  Iron and TIBC  Result Value Ref Range   Iron 40 (L) 42 - 145 ug/dL   UIBC 132 440 - 102 ug/dL   TIBC 725 366 - 440 ug/dL   %SAT 9 (L) 20 - 55 %  Ferritin  Result Value Ref Range   Ferritin 13 10 - 291 ng/mL  POCT CBC  Result Value Ref Range   WBC 5.7 4.6 - 10.2 K/uL   Lymph, poc 2.3 0.6 - 3.4   POC LYMPH PERCENT 41.1 10 - 50 %L   MID (cbc) 0.4 0 - 0.9   POC MID % 7.5 0 - 12 %M   POC Granulocyte 2.9 2 - 6.9   Granulocyte percent 51.4 37 - 80 %G   RBC 4.03 (A) 4.04 - 5.48 M/uL   Hemoglobin 10.6 (A) 12.2 - 16.2 g/dL   HCT, POC 16.1 (A) 09.6 - 47.9 %   MCV 87.0 80 - 97 fL   MCH, POC 26.4 (A) 27 - 31.2 pg   MCHC 30.4 (A) 31.8 - 35.4 g/dL   RDW, POC 04.5 %   Platelet Count, POC 282 142 - 424 K/uL   MPV 6.4 0 - 99.8 fL     EKG/XRAY:   Primary read interpreted by Dr. Conley Rolls at [facility]. No obvious fractures or dilsocation, please comment    ASSESSMENT/PLAN: Encounter Diagnoses  Name Primary?  . Left elbow pain Yes  . Left forearm pain   . Effusion of left elbow     THere was a possiblity of an occult fracture at the elbow , ? Radial head , she did land on the elbow directly, she has pain with IR and ER and effusion.  Posterior Splint with Sling Otc pain meds, declined rx pain meds Fu in 1 week to get xrays out of splint D/w patient about her official xrays below  IMPRESSION: 1. Positive for joint effusion which increases suspicion of acute fracture. 2. No acute fracture or dislocation identified. Recommend immobilization and  follow-up x-rays to exclude occult fracture.   Electronically Signed  By: Odessa Fleming M.D.  On: 02/18/2015 16:30  Gross sideeffects, risk and benefits, and alternatives of medications d/w patient. Patient is aware that all medications have potential sideeffects and we are unable to predict every sideeffect or drug-drug interaction that may occur.  Shondell Poulson DO  02/18/2015 4:47 PM

## 2015-03-14 ENCOUNTER — Other Ambulatory Visit: Payer: Self-pay | Admitting: Urgent Care

## 2015-03-16 ENCOUNTER — Ambulatory Visit (INDEPENDENT_AMBULATORY_CARE_PROVIDER_SITE_OTHER): Payer: BLUE CROSS/BLUE SHIELD

## 2015-03-16 ENCOUNTER — Ambulatory Visit (INDEPENDENT_AMBULATORY_CARE_PROVIDER_SITE_OTHER): Payer: BLUE CROSS/BLUE SHIELD | Admitting: Family Medicine

## 2015-03-16 VITALS — BP 130/70 | HR 92 | Temp 98.0°F | Resp 20 | Wt 148.6 lb

## 2015-03-16 DIAGNOSIS — L659 Nonscarring hair loss, unspecified: Secondary | ICD-10-CM

## 2015-03-16 DIAGNOSIS — M25522 Pain in left elbow: Secondary | ICD-10-CM | POA: Diagnosis not present

## 2015-03-16 DIAGNOSIS — D649 Anemia, unspecified: Secondary | ICD-10-CM

## 2015-03-16 LAB — POCT CBC
Granulocyte percent: 60.9 %G (ref 37–80)
HCT, POC: 31.4 % — AB (ref 37.7–47.9)
Hemoglobin: 10.2 g/dL — AB (ref 12.2–16.2)
Lymph, poc: 2.1 (ref 0.6–3.4)
MCH, POC: 25.2 pg — AB (ref 27–31.2)
MCHC: 32.5 g/dL (ref 31.8–35.4)
MCV: 77.4 fL — AB (ref 80–97)
MID (cbc): 0.4 (ref 0–0.9)
MPV: 6.9 fL (ref 0–99.8)
POC Granulocyte: 3.9 (ref 2–6.9)
POC LYMPH PERCENT: 32.9 %L (ref 10–50)
POC MID %: 6.2 %M (ref 0–12)
Platelet Count, POC: 307 10*3/uL (ref 142–424)
RBC: 4.05 M/uL (ref 4.04–5.48)
RDW, POC: 20.1 %
WBC: 6.4 10*3/uL (ref 4.6–10.2)

## 2015-03-16 LAB — THYROID PANEL WITH TSH
Free Thyroxine Index: 2.2 (ref 1.4–3.8)
T3 Uptake: 31 % (ref 22–35)
T4, Total: 7 ug/dL (ref 4.5–12.0)
TSH: 1.478 u[IU]/mL (ref 0.350–4.500)

## 2015-03-16 LAB — FERRITIN: Ferritin: 10 ng/mL (ref 10–291)

## 2015-03-16 NOTE — Progress Notes (Signed)
This is a middle-aged woman who presents with persistent left elbow pain following a fall 4 weeks ago. She was seen here initially and thought to have a possible radial head fracture. She's put in a splint and told to come back in a week. She failed to come back but because of the pain persisting, wants further evaluation at this point.  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  Patient notes the pain is worse with pronation and supination, lifting heavy objects, or pushing on things. She has tenderness in the lateral epicondyle region as well as the radial head or performed.  The ecchymosis and swelling is subsided.  Objective:BP 130/70 mmHg  Pulse 92  Temp(Src) 98 F (36.7 C) (Oral)  Resp 20  Wt 148 lb 9.6 oz (67.405 kg)  SpO2 97% Examination of the elbow reveals full range of motion but patient does have tenderness with a lateral epicondyle and radial head regions. There is no bony abnormality seen and she has good wrist and hand range of motion as well.  [facility] reading (PRIMARY) by  Dr. Milus Glazier:  .

## 2015-03-16 NOTE — Patient Instructions (Addendum)
Because you received an x-ray today, you will receive an invoice from [facility]. Please contact [facility] at [phone] with questions or concerns regarding your invoice. Our billing staff will not be able to assist you with those questions.  The best iron is ferrous gluconate.

## 2015-04-04 ENCOUNTER — Encounter: Payer: Self-pay | Admitting: Family Medicine

## 2015-04-05 ENCOUNTER — Ambulatory Visit (INDEPENDENT_AMBULATORY_CARE_PROVIDER_SITE_OTHER): Payer: BLUE CROSS/BLUE SHIELD | Admitting: Family Medicine

## 2015-04-05 VITALS — BP 133/76 | HR 73 | Temp 98.5°F | Resp 16 | Ht 63.0 in | Wt 148.0 lb

## 2015-04-05 DIAGNOSIS — M25522 Pain in left elbow: Secondary | ICD-10-CM

## 2015-04-05 DIAGNOSIS — R7303 Prediabetes: Secondary | ICD-10-CM

## 2015-04-05 DIAGNOSIS — D649 Anemia, unspecified: Secondary | ICD-10-CM

## 2015-04-05 LAB — POCT CBC
Granulocyte percent: 53.2 %G (ref 37–80)
HCT, POC: 32 % — AB (ref 37.7–47.9)
Hemoglobin: 10.4 g/dL — AB (ref 12.2–16.2)
Lymph, poc: 2.9 (ref 0.6–3.4)
MCH, POC: 25.8 pg — AB (ref 27–31.2)
MCHC: 32.6 g/dL (ref 31.8–35.4)
MCV: 79.2 fL — AB (ref 80–97)
MID (cbc): 0.3 (ref 0–0.9)
MPV: 7.1 fL (ref 0–99.8)
POC Granulocyte: 3.6 (ref 2–6.9)
POC LYMPH PERCENT: 42.8 %L (ref 10–50)
POC MID %: 4 %M (ref 0–12)
Platelet Count, POC: 330 10*3/uL (ref 142–424)
RBC: 4.04 M/uL (ref 4.04–5.48)
RDW, POC: 20.9 %
WBC: 6.8 10*3/uL (ref 4.6–10.2)

## 2015-04-05 LAB — POCT GLYCOSYLATED HEMOGLOBIN (HGB A1C): Hemoglobin A1C: 5.9

## 2015-04-05 NOTE — Progress Notes (Addendum)
By signing my name below, I, Stann Ore, attest that this documentation has been prepared under the direction and in the presence of Elvina Sidle, MD. Electronically Signed: Stann Ore, Scribe. 04/05/2015 , 5:17 PM .  Patient was seen in room 14 .   Patient ID: Lynn Harvey MRN: [id number], DOB: 09/28/1965, 50 y.o. Date of Encounter: 04/05/2015  Primary Physician: Olene Floss  Chief Complaint:  Chief Complaint  Patient presents with  . Follow-up    elbow pain/ left. pt would also like to get her A1c checked.    HPI:  Lynn Harvey is a 50 y.o. female who presents to [facility] for follow up on left elbow pain. She states that it hurts most when she flexes her arm, including movements like pulling up her pants and brushing her hair. This pain started when she fell initially about 6 weeks ago.   She has family history of diabetes (mother).   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   Past Medical History  Diagnosis Date  . Hypertension   . Depression   . Insomnia   . Allergy   . Anxiety   . Rectal abnormality   . Myocardial infarction (HCC) 06/2013  . Coronary artery disease   . Gestational diabetes      Home Meds: Prior to Admission medications   Medication Sig Start Date End Date Taking? Authorizing Provider  amphetamine-dextroamphetamine (ADDERALL) 15 MG tablet Take 0.5 tablets by mouth 2 (two) times daily. 05/17/14  Yes [name] Jeffery, PA-C  aspirin EC 81 MG tablet Take 81 mg by mouth daily.   Yes Historical Provider, MD  atorvastatin (LIPITOR) 40 MG tablet TAKE 1 TABLET BY MOUTH DAILY. PATIENT NEEDS OFFICE VISIT/ FASTING LABS FOR ADDITIONAL REFILLS 03/15/15  Yes Morrell Riddle, PA-C  buPROPion (WELLBUTRIN XL) 150 MG 24 hr tablet Take 2 tablets (300 mg total) by mouth daily. 02/10/14  Yes [name] Jeffery, PA-C  ferrous sulfate 325 (65 FE) MG EC tablet Take 1 tablet (325 mg total) by mouth 3 (three)  times daily with meals. 01/27/14  Yes Malachy Mood, MD  losartan (COZAAR) 50 MG tablet Take 1 tablet (50 mg total) by mouth daily. 11/18/14  Yes [name], MD  metoprolol tartrate (LOPRESSOR) 25 MG tablet TAKE 1 TABLET (25 MG TOTAL) BY MOUTH TWO   (TWO) TIMES DAILY. 02/10/15  Yes [name], MD  nitroGLYCERIN (NITROSTAT) 0.4 MG SL tablet Place 1 tablet (0.4 mg total) under the tongue every 5 (five) minutes as needed for chest pain. 06/26/13  Yes Myra Rude, MD  valACYclovir (VALTREX) 500 MG tablet Take 500 mg by mouth 3 (three) times daily as needed (for fever blister). Reported on 03/16/2015 04/13/13  Yes Historical Provider, MD  zolpidem (AMBIEN) 5 MG tablet  12/07/14  Yes Historical Provider, MD    Allergies:  Allergies  Allergen Reactions  . Morphine And Related Nausea And Vomiting    Social History   Social History  . Marital Status: Married    Spouse Name: [name]  . Number of Children: 3  . Years of Education: 12   Occupational History  . CUSTOMER SERVICE [employer]   Social History Main Topics  . Smoking status: Former Smoker -- 2.00 packs/day for 20 years    Types: Cigarettes    Start date: 12/25/1982  . Smokeless tobacco: Never Used     Comment: quit 06/24/13  .  Alcohol Use: 14.4 oz/week    24 Cans of beer per week     Comment: more since fall 2012  . Drug Use: No  . Sexual Activity:    Partners: Male    Birth Control/ Protection: Surgical   Other Topics Concern  . Not on file   Social History Narrative   Lives with her husband, their son [name], and her daughter Hospital doctor and Amber's son [name].     Review of Systems: Constitutional: negative for fever, chills, night sweats, weight changes, or fatigue  HEENT: negative for vision changes, hearing loss, congestion, rhinorrhea, ST, epistaxis, or sinus pressure Cardiovascular: negative for chest pain or palpitations Respiratory: negative for hemoptysis, wheezing, shortness of breath, or cough Abdominal:  negative for abdominal pain, nausea, vomiting, diarrhea, or constipation Dermatological: negative for rash Musc: positive for arthralgia (left elbow) Neurologic: negative for headache, dizziness, or syncope All other systems reviewed and are otherwise negative with the exception to those above and in the HPI.  Physical Exam: Blood pressure 133/76, pulse 73, temperature 98.5 F (36.9 C), temperature source Oral, resp. rate 16, height  (1.6 m), weight 148 lb (67.132 kg), SpO2 98 %., Body mass index is 26.22 kg/(m^2). General: Well developed, well nourished, in no acute distress. Head: Normocephalic, atraumatic, eyes without discharge, sclera non-icteric, nares are without discharge. Bilateral auditory canals clear, TM's are without perforation, pearly grey and translucent with reflective cone of light bilaterally. Oral cavity moist, posterior pharynx without exudate, erythema, peritonsillar abscess, or post nasal drip.  Neck: Supple. No thyromegaly. Full ROM. No lymphadenopathy. Lungs: Clear bilaterally to auscultation without wheezes, rales, or rhonchi. Breathing is unlabored. Heart: RRR with S1 S2. No murmurs, rubs, or gallops appreciated. Msk:  Strength and tone normal for age. Extremities/Skin: Warm and dry. Non tender, full rom, but patient indicates pain over lateral epicondyle Neuro: Alert and oriented X 3. Moves all extremities spontaneously. Gait is normal. CNII-XII grossly in tact. Psych:  Responds to questions appropriately with a normal affect.   Labs: Results for orders placed or performed in visit on 04/05/15  POCT glycosylated hemoglobin (Hb A1C)  Result Value Ref Range   Hemoglobin A1C 5.9   POCT CBC  Result Value Ref Range   WBC 6.8 4.6 - 10.2 K/uL   Lymph, poc 2.9 0.6 - 3.4   POC LYMPH PERCENT 42.8 10 - 50 %L   MID (cbc) 0.3 0 - 0.9   POC MID % 4.0 0 - 12 %M   POC Granulocyte 3.6 2 - 6.9   Granulocyte percent 53.2 37 - 80 %G   RBC 4.04 4.04 - 5.48 M/uL    Hemoglobin 10.4 (A) 12.2 - 16.2 g/dL   HCT, POC 09.8 (A) 11.9 - 47.9 %   MCV 79.2 (A) 80 - 97 fL   MCH, POC 25.8 (A) 27 - 31.2 pg   MCHC 32.6 31.8 - 35.4 g/dL   RDW, POC 14.7 %   Platelet Count, POC 330 142 - 424 K/uL   MPV 7.1 0 - 99.8 fL      ASSESSMENT AND PLAN:  50 y.o. year old female with .kliag This chart was scribed in my presence and reviewed by me personally.    ICD-9-CM ICD-10-CM   1. Anemia, unspecified anemia type 285.9 D64.9 POCT CBC     Ambulatory referral to Hematology  2. Prediabetes 790.29 R73.03 POCT glycosylated hemoglobin (Hb A1C)  3. Left elbow pain 719.42 M25.522 Ambulatory referral to Orthopedic Surgery  Signed, Elvina Sidle, MD 04/05/2015 5:17 PM

## 2015-04-06 ENCOUNTER — Telehealth: Payer: Self-pay

## 2015-04-06 NOTE — Telephone Encounter (Signed)
Pt needs a copy of her xrays that were done [phone] of her left elbow  Best number to call once ready [phone]

## 2015-04-07 ENCOUNTER — Other Ambulatory Visit: Payer: Self-pay | Admitting: Cardiovascular Disease

## 2015-04-07 ENCOUNTER — Other Ambulatory Visit: Payer: Self-pay | Admitting: Physician Assistant

## 2015-04-07 NOTE — Telephone Encounter (Signed)
Rx(s) sent to pharmacy electronically.  

## 2015-04-08 NOTE — Telephone Encounter (Signed)
Called pt and advised that she needs a CPE, or at least check up and fasting labs. Pt agreed. She is at ortho right now, and we are also sending her to hematologist. She will call back and sched appt so that it will be after her specialists' appts and can discuss with [name]. Advised her I would send in a month RF for now.

## 2015-04-27 DIAGNOSIS — Z0000 Encounter for general adult medical examination without abnormal findings: Secondary | ICD-10-CM | POA: Insufficient documentation

## 2015-04-27 DIAGNOSIS — R232 Flushing: Secondary | ICD-10-CM | POA: Insufficient documentation

## 2015-04-27 DIAGNOSIS — D649 Anemia, unspecified: Secondary | ICD-10-CM | POA: Insufficient documentation

## 2015-04-27 DIAGNOSIS — R5383 Other fatigue: Secondary | ICD-10-CM | POA: Insufficient documentation

## 2015-04-27 DIAGNOSIS — I252 Old myocardial infarction: Secondary | ICD-10-CM | POA: Insufficient documentation

## 2015-05-04 DIAGNOSIS — L720 Epidermal cyst: Secondary | ICD-10-CM | POA: Insufficient documentation

## 2015-05-04 DIAGNOSIS — E559 Vitamin D deficiency, unspecified: Secondary | ICD-10-CM | POA: Insufficient documentation

## 2015-05-04 DIAGNOSIS — R7309 Other abnormal glucose: Secondary | ICD-10-CM | POA: Insufficient documentation

## 2015-05-10 ENCOUNTER — Telehealth: Payer: Self-pay

## 2015-05-10 NOTE — Telephone Encounter (Signed)
Pt in need of her ADDERALL 15 MG. Please call [phone] when ready for pick up

## 2015-05-10 NOTE — Telephone Encounter (Signed)
Need OV

## 2015-05-12 NOTE — Telephone Encounter (Signed)
Advised pt to come in to discuss ADD.

## 2015-05-16 ENCOUNTER — Ambulatory Visit: Payer: BLUE CROSS/BLUE SHIELD

## 2015-05-16 ENCOUNTER — Ambulatory Visit (HOSPITAL_BASED_OUTPATIENT_CLINIC_OR_DEPARTMENT_OTHER): Payer: BLUE CROSS/BLUE SHIELD | Admitting: Family

## 2015-05-16 ENCOUNTER — Other Ambulatory Visit (HOSPITAL_BASED_OUTPATIENT_CLINIC_OR_DEPARTMENT_OTHER): Payer: BLUE CROSS/BLUE SHIELD

## 2015-05-16 ENCOUNTER — Encounter: Payer: Self-pay | Admitting: Family

## 2015-05-16 VITALS — BP 131/70 | HR 67 | Temp 98.7°F | Resp 16 | Ht 63.0 in | Wt 148.0 lb

## 2015-05-16 DIAGNOSIS — D509 Iron deficiency anemia, unspecified: Secondary | ICD-10-CM | POA: Diagnosis not present

## 2015-05-16 LAB — CBC WITH DIFFERENTIAL (CANCER CENTER ONLY)
BASO#: 0 10*3/uL (ref 0.0–0.2)
BASO%: 0.4 % (ref 0.0–2.0)
EOS%: 1.4 % (ref 0.0–7.0)
Eosinophils Absolute: 0.1 10*3/uL (ref 0.0–0.5)
HCT: 32.2 % — ABNORMAL LOW (ref 34.8–46.6)
HGB: 10.2 g/dL — ABNORMAL LOW (ref 11.6–15.9)
LYMPH#: 1.9 10*3/uL (ref 0.9–3.3)
LYMPH%: 33.7 % (ref 14.0–48.0)
MCH: 25.1 pg — ABNORMAL LOW (ref 26.0–34.0)
MCHC: 31.7 g/dL — ABNORMAL LOW (ref 32.0–36.0)
MCV: 79 fL — AB (ref 81–101)
MONO#: 0.5 10*3/uL (ref 0.1–0.9)
MONO%: 8.6 % (ref 0.0–13.0)
NEUT%: 55.9 % (ref 39.6–80.0)
NEUTROS ABS: 3.2 10*3/uL (ref 1.5–6.5)
PLATELETS: 284 10*3/uL (ref 145–400)
RBC: 4.06 10*6/uL (ref 3.70–5.32)
RDW: 15.9 % — ABNORMAL HIGH (ref 11.1–15.7)
WBC: 5.7 10*3/uL (ref 3.9–10.0)

## 2015-05-16 LAB — CHCC SATELLITE - SMEAR

## 2015-05-16 NOTE — Progress Notes (Signed)
Hematology/Oncology Consultation   Name: Lynn Harvey      MRN: [id number]    Location: Room/bed info not found  Date: 05/16/2015 Time:3:15 PM   REFERRING PHYSICIAN: Robyn Haber, MD  REASON FOR CONSULT: Anemia   DIAGNOSIS: Anemia of unknown cause   HISTORY OF PRESENT ILLNESS: Lynn Harvey is a very pleasant 50 yo white female with history of iron deficiency anemia since having an MI in 2015. She was a heavy smoker (2 ppd) at the time and states she had her "left main artery cleaned out and no stents." Her cardiologist is Dr. Claiborne Billings and she follows up with him every 6 months. She takes an aspirin daily.  Her ferritin earlier this month was 6 with a total iron of 20. She is symptomatic with fatigue, chewing ice and SOB with exertion.  She had a partial hysterectomy in 2007. She has occasional hot flashes.   No family history of anemia.  No personal cancer history. Family cancer history includes: mother, aunt and maternal grandfather with small cell lung cancer.  She quit smoking after her MI. She has an occasional beer when she is out socially.  She had her colonoscopy and endoscopy in 2015 and both were negative. She states that she does have hemorrhoids that occassionally bleed  She states that she is up to date and her mammogram and it was negative.  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  No fever, chills, n/v, cough, rash, dizziness, chest pain, palpitations, abdominal pain or changes in bowel or bladder habits.  She has a good appetite and is staying well hydrated. She denies any significant weight loss or gain.  She is "borderline" diabetic and check her blood sugars occasionally. She states that she averages 103-108. Her Hgb A1c a few weeks ago was 5.9.  She is originally from [place], [place] but has liver in [place] for over 20 years. She works in Therapist, art for a  Sports coach. She likes her job but can tell that her performance is lacking due to the fatigue.   ROS: All other 10 point review of systems is negative.   PAST MEDICAL HISTORY:   Past Medical History  Diagnosis Date  . Hypertension   . Depression   . Insomnia   . Allergy   . Anxiety   . Rectal abnormality   . Myocardial infarction ([place]) 06/2013  . Coronary artery disease   . Gestational diabetes     ALLERGIES: Allergies  Allergen Reactions  . Morphine And Related Nausea And Vomiting      MEDICATIONS:  Current Outpatient Prescriptions on File Prior to Visit  Medication Sig Dispense Refill  . amphetamine-dextroamphetamine (ADDERALL) 15 MG tablet Take 0.5 tablets by mouth 2 (two) times daily. 60 tablet 0  . aspirin EC 81 MG tablet Take 81 mg by mouth daily.    Marland Kitchen atorvastatin (LIPITOR) 40 MG tablet TAKE 1 TABLET BY MOUTH DAILY. PATIENT NEEDS OFFICE VISIT/ FASTING LABS FOR ADDITIONAL REFILLS 30 tablet 0  . buPROPion (WELLBUTRIN XL) 150 MG 24 hr tablet Take 2 tablets (300 mg total) by mouth daily. 60 tablet 5  . ferrous sulfate 325 (65 FE) MG EC tablet Take 1 tablet (325 mg total) by mouth 3 (three) times daily with meals. 90 tablet 3  . losartan (COZAAR) 50 MG tablet TAKE 1 TABLET (50 MG TOTAL) BY MOUTH  DAILY. 30 tablet 4  . metoprolol tartrate (LOPRESSOR) 25 MG tablet TAKE 1 TABLET (25 MG TOTAL) BY MOUTH TWO   (TWO) TIMES DAILY. 60 tablet 6  . nitroGLYCERIN (NITROSTAT) 0.4 MG SL tablet Place 1 tablet (0.4 mg total) under the tongue every 5 (five) minutes as needed for chest pain. 10 tablet 12  . valACYclovir (VALTREX) 500 MG tablet Take 500 mg by mouth 3 (three) times daily as needed (for fever blister). Reported on 03/16/2015    . zolpidem (AMBIEN) 5 MG tablet      No current facility-administered medications on file prior to visit.     PAST SURGICAL HISTORY Past Surgical History  Procedure Laterality Date  . Wrist surgery  1987    RIGHT; bone from forearm grafted to  wrist  . Abdominal hysterectomy    . Tonsilectomy, adenoidectomy, bilateral myringotomy and tubes    . Colon surgery      Flexible Sigmoidoscopy  . Esophagogastroduodenoscopy N/A 01/08/2014    Procedure: ESOPHAGOGASTRODUODENOSCOPY (EGD);  Surgeon: Beryle Beams, MD;  Location: [facility];  Service: Endoscopy;  Laterality: N/A;  . Colonoscopy N/A 01/08/2014    Procedure: COLONOSCOPY;  Surgeon: Beryle Beams, MD;  Location: [facility];  Service: Endoscopy;  Laterality: N/A;  . Left heart catheterization with coronary angiogram N/A 06/24/2013    Procedure: LEFT HEART CATHETERIZATION WITH CORONARY ANGIOGRAM;  Surgeon: Troy Sine, MD;  Location: [facility] CATH LAB;  Service: Cardiovascular;  Laterality: N/A;  . Left heart catheterization with coronary angiogram N/A 07/17/2013    Procedure: LEFT HEART CATHETERIZATION WITH CORONARY ANGIOGRAM;  Surgeon: Burnell Blanks, MD;  Location: [facility] CATH LAB;  Service: Cardiovascular;  Laterality: N/A;    FAMILY HISTORY: Family History  Problem Relation Age of Onset  . Cancer Mother   . COPD Mother     emphysema  . Heart disease Father 89    s/p 3V CABG  . Depression Father   . Mental illness Sister     depression  . Depression Sister   . Mental illness Son     ADHD, bipolar disorder  . Diabetes Maternal Grandmother   . Heart disease Maternal Grandmother   . Diabetes Maternal Grandfather   . Cancer Maternal Grandfather     lung cancer  . Diabetes Paternal Grandmother   . Kidney disease Paternal Grandmother   . Diabetes Paternal Grandfather   . Heart disease Paternal Grandfather   . Barrett's esophagus Father     SOCIAL HISTORY:  reports that she has quit smoking. Her smoking use included Cigarettes. She started smoking about 32 years ago. She has a 40 pack-year smoking history. She has never used smokeless tobacco. She reports that she drinks about 14.4 oz of alcohol per week. She reports that she does not use illicit  drugs.  PERFORMANCE STATUS: The patient's performance status is 1 - Symptomatic but completely ambulatory  PHYSICAL EXAM: Most Recent Vital Signs: There were no vitals taken for this visit. BP 131/70 mmHg  Pulse 67  Temp(Src) 98.7 F (37.1 C) (Oral)  Resp 16  Ht '5\' 3"'$  (1.6 m)  Wt 148 lb (67.132 kg)  BMI 26.22 kg/m2  General Appearance:    Alert, cooperative, no distress, appears stated age  Head:    Normocephalic, without obvious abnormality, atraumatic  Eyes:    PERRL, conjunctiva/corneas clear, EOM's intact, fundi    benign, both eyes        Throat:   Lips, mucosa, and tongue normal; teeth and  gums normal  Neck:   Supple, symmetrical, trachea midline, no adenopathy;    thyroid:  no enlargement/tenderness/nodules; no carotid   bruit or JVD  Back:     Symmetric, no curvature, ROM normal, no CVA tenderness  Lungs:     Clear to auscultation bilaterally, respirations unlabored  Chest Wall:    No tenderness or deformity   Heart:    Regular rate and rhythm, S1 and S2 normal, no murmur, rub   or gallop     Abdomen:     Soft, non-tender, bowel sounds active all four quadrants,    no masses, no organomegaly        Extremities:   Extremities normal, atraumatic, no cyanosis or edema  Pulses:   2+ and symmetric all extremities  Skin:   Skin color, texture, turgor normal, no rashes or lesions  Lymph nodes:   Cervical, supraclavicular, and axillary nodes normal  Neurologic:   CNII-XII intact, normal strength, sensation and reflexes    throughout   LABORATORY DATA:  Results for orders placed or performed in visit on 05/16/15 (from the past 48 hour(s))  CBC with Differential [facility])     Status: Abnormal   Collection Time: 05/16/15  2:59 PM  Result Value Ref Range   WBC 5.7 3.9 - 10.0 10e3/uL   RBC 4.06 3.70 - 5.32 10e6/uL   HGB 10.2 (L) 11.6 - 15.9 g/dL   HCT 32.2 (L) 34.8 - 46.6 %   MCV 79 (L) 81 - 101 fL   MCH 25.1 (L) 26.0 - 34.0 pg   MCHC 31.7 (L) 32.0 - 36.0 g/dL    RDW 15.9 (H) 11.1 - 15.7 %   Platelets 284 145 - 400 10e3/uL   NEUT# 3.2 1.5 - 6.5 10e3/uL   LYMPH# 1.9 0.9 - 3.3 10e3/uL   MONO# 0.5 0.1 - 0.9 10e3/uL   Eosinophils Absolute 0.1 0.0 - 0.5 10e3/uL   BASO# 0.0 0.0 - 0.2 10e3/uL   NEUT% 55.9 39.6 - 80.0 %   LYMPH% 33.7 14.0 - 48.0 %   MONO% 8.6 0.0 - 13.0 %   EOS% 1.4 0.0 - 7.0 %   BASO% 0.4 0.0 - 2.0 %  [facility] - Smear     Status: None   Collection Time: 05/16/15  2:59 PM  Result Value Ref Range   Smear Result Smear Available       RADIOGRAPHY: No results found.     PATHOLOGY: None  ASSESSMENT/PLAN: Ms. Magadan is a very pleasant 50 yo white female with history of iron deficiency anemia since having an MI in 2015. Her ferritin earlier this month was 6 with a total iron of 20. She is symptomatic with fatigue, chewing ice and SOB with exertion. Her lab work is pending.  We will plan to give her a dose of Feraheme tomorrow and then again in 8 days. We will then see her back for a follow-up and repeat labs in 6 weeks.   All questions were answered. She will contact us with any problems, questions or concerns. We can certainly see her much sooner if necessary.  She was discussed with and also seen by Dr. Marin Olp and he is in agreement with the aforementioned.   [facility]    Addendum:  I saw and examined patient with Sarah. We will get her blood smear. She had some microcytic red cells. She has some hyperchromic red cells. There were no target cells. She had no nucleated red cells. She had  no inclusion bodies. White cells appeared normal in morphology maturation. She had no hypersegmented polys. She had no immature myeloid or lymphoid cells. Platelets were adequate in number and size.  We did a hemoglobin or pheresis on her. She had normal hemoglobin A.  Her iron studies: Show iron deficiency. Her ferritin is 10 with an iron saturation of only 6%.  There was noted on her exam looked suspicious.  I think that IV  iron will help her out.  I do not see a need for a bone marrow biopsy.  We will plan to get her back to see [place] in another 6 weeks.  We spent about 45 minutes with her.  Lum Keas

## 2015-05-17 ENCOUNTER — Ambulatory Visit (HOSPITAL_BASED_OUTPATIENT_CLINIC_OR_DEPARTMENT_OTHER): Payer: BLUE CROSS/BLUE SHIELD

## 2015-05-17 ENCOUNTER — Other Ambulatory Visit: Payer: Self-pay | Admitting: Family

## 2015-05-17 DIAGNOSIS — D509 Iron deficiency anemia, unspecified: Secondary | ICD-10-CM

## 2015-05-17 LAB — IRON AND TIBC
%SAT: 6 % — ABNORMAL LOW (ref 21–57)
Iron: 27 ug/dL — ABNORMAL LOW (ref 41–142)
TIBC: 478 ug/dL — ABNORMAL HIGH (ref 236–444)
UIBC: 451 ug/dL — AB (ref 120–384)

## 2015-05-17 LAB — RETICULOCYTES: RETICULOCYTE COUNT: 1.9 % (ref 0.6–2.6)

## 2015-05-17 LAB — CANCER ANTIGEN 27.29: CAN 27.29: 26 U/mL (ref 0.0–38.6)

## 2015-05-17 LAB — FERRITIN: FERRITIN: 10 ng/mL (ref 9–269)

## 2015-05-17 MED ORDER — SODIUM CHLORIDE 0.9 % IV SOLN
510.0000 mg | Freq: Once | INTRAVENOUS | Status: AC
  Administered 2015-05-17: 510 mg via INTRAVENOUS
  Filled 2015-05-17: qty 17

## 2015-05-17 MED ORDER — SODIUM CHLORIDE 0.9 % IV SOLN
Freq: Once | INTRAVENOUS | Status: AC
  Administered 2015-05-17: 13:00:00 via INTRAVENOUS

## 2015-05-17 NOTE — Patient Instructions (Signed)

## 2015-05-18 LAB — HEMOGLOBINOPATHY EVALUATION
HEMOGLOBIN A2 QUANTITATION: 1.5 % (ref 0.7–3.1)
HGB A: 98.5 % — AB (ref 94.0–98.0)
HGB C: 0 %
HGB S: 0 %
Hemoglobin F Quantitation: 0 % (ref 0.0–2.0)

## 2015-05-20 LAB — ALPHA-THALASSEMIA GENOTYPR: PDF: 0

## 2015-05-26 ENCOUNTER — Ambulatory Visit (HOSPITAL_BASED_OUTPATIENT_CLINIC_OR_DEPARTMENT_OTHER): Payer: BLUE CROSS/BLUE SHIELD

## 2015-05-26 VITALS — BP 120/41 | HR 67 | Temp 97.6°F | Resp 16

## 2015-05-26 DIAGNOSIS — D509 Iron deficiency anemia, unspecified: Secondary | ICD-10-CM

## 2015-05-26 MED ORDER — SODIUM CHLORIDE 0.9 % IV SOLN
Freq: Once | INTRAVENOUS | Status: AC
  Administered 2015-05-26: 13:00:00 via INTRAVENOUS

## 2015-05-26 MED ORDER — SODIUM CHLORIDE 0.9 % IV SOLN
510.0000 mg | Freq: Once | INTRAVENOUS | Status: AC
  Administered 2015-05-26: 510 mg via INTRAVENOUS
  Filled 2015-05-26: qty 17

## 2015-05-26 NOTE — Patient Instructions (Signed)

## 2015-06-24 ENCOUNTER — Telehealth: Payer: Self-pay | Admitting: Cardiovascular Disease

## 2015-06-29 ENCOUNTER — Ambulatory Visit (HOSPITAL_BASED_OUTPATIENT_CLINIC_OR_DEPARTMENT_OTHER): Payer: BLUE CROSS/BLUE SHIELD | Admitting: Family

## 2015-06-29 ENCOUNTER — Other Ambulatory Visit (HOSPITAL_BASED_OUTPATIENT_CLINIC_OR_DEPARTMENT_OTHER): Payer: BLUE CROSS/BLUE SHIELD

## 2015-06-29 VITALS — BP 129/76 | HR 72 | Temp 98.2°F | Resp 16 | Ht 63.0 in | Wt 147.0 lb

## 2015-06-29 DIAGNOSIS — D509 Iron deficiency anemia, unspecified: Secondary | ICD-10-CM | POA: Diagnosis not present

## 2015-06-29 LAB — CBC WITH DIFFERENTIAL (CANCER CENTER ONLY)
BASO#: 0 10*3/uL (ref 0.0–0.2)
BASO%: 0.3 % (ref 0.0–2.0)
EOS%: 1.3 % (ref 0.0–7.0)
Eosinophils Absolute: 0.1 10*3/uL (ref 0.0–0.5)
HEMATOCRIT: 36.8 % (ref 34.8–46.6)
HGB: 12.6 g/dL (ref 11.6–15.9)
LYMPH#: 1.8 10*3/uL (ref 0.9–3.3)
LYMPH%: 26.8 % (ref 14.0–48.0)
MCH: 29.6 pg (ref 26.0–34.0)
MCHC: 34.2 g/dL (ref 32.0–36.0)
MCV: 87 fL (ref 81–101)
MONO#: 0.5 10*3/uL (ref 0.1–0.9)
MONO%: 7.7 % (ref 0.0–13.0)
NEUT#: 4.3 10*3/uL (ref 1.5–6.5)
NEUT%: 63.9 % (ref 39.6–80.0)
Platelets: 311 10*3/uL (ref 145–400)
RBC: 4.25 10*6/uL (ref 3.70–5.32)
RDW: 21.3 % — AB (ref 11.1–15.7)
WBC: 6.8 10*3/uL (ref 3.9–10.0)

## 2015-06-29 NOTE — Progress Notes (Signed)
Hematology and Oncology Follow Up Visit  Lynn Harvey [id number] 1965/11/14 51 y.o. 06/29/2015   Principle Diagnosis:  Iron deficiency anemia   Current Therapy:   IV iron as indicated     Interim History:  Lynn Harvey is here today for a follow-up. She states that she is feeling much better since receiving two doses of of iron in January/February. She is no longer chewing ice and her SOB is improved. She denies fatigued.  No fever, chills, n/v, cough, rash, dizziness, chest pain, palpitations, abdominal pain or changes in bowel or bladder habits.  No swelling or tenderness in her extremities. She has some numbness and tinging in her hands due to degenerative discs in there cervical and lumbar spine. No new aches or pains.   She has maintained a good appetite and is staying well hydrated. Her weight is unchanged.  Medications:    Medication List       This list is accurate as of: 06/29/15  1:36 PM.  Always use your most recent med list.               amphetamine-dextroamphetamine 15 MG tablet  Commonly known as:  ADDERALL  Take 0.5 tablets by mouth 2 (two) times daily.     aspirin EC 81 MG tablet  Take 81 mg by mouth daily.     atorvastatin 40 MG tablet  Commonly known as:  LIPITOR  TAKE 1 TABLET BY MOUTH DAILY. PATIENT NEEDS OFFICE VISIT/ FASTING LABS FOR ADDITIONAL REFILLS     buPROPion 150 MG 24 hr tablet  Commonly known as:  WELLBUTRIN XL  Take 2 tablets (300 mg total) by mouth daily.     losartan 50 MG tablet  Commonly known as:  COZAAR  TAKE 1 TABLET (50 MG TOTAL) BY MOUTH DAILY.     metoprolol tartrate 25 MG tablet  Commonly known as:  LOPRESSOR  TAKE 1 TABLET (25 MG TOTAL) BY MOUTH TWO   (TWO) TIMES DAILY.     nitroGLYCERIN 0.4 MG SL tablet  Commonly known as:  NITROSTAT  Place 1 tablet (0.4 mg total) under the tongue every 5 (five) minutes as needed for chest pain.     valACYclovir 500 MG tablet  Commonly known as:  VALTREX  Take 500 mg by mouth 3  (three) times daily as needed (for fever blister). Reported on 03/16/2015     zolpidem 5 MG tablet  Commonly known as:  AMBIEN        Allergies:  Allergies  Allergen Reactions  . Morphine And Related Nausea And Vomiting    Past Medical History, Surgical history, Social history, and Family History were reviewed and updated.  Review of Systems: All other 10 point review of systems is negative.   Physical Exam:  height is 5\' 3"  (1.6 m) and weight is 147 lb (66.679 kg). Her oral temperature is 98.2 F (36.8 C). Her blood pressure is 129/76 and her pulse is 72. Her respiration is 16.   Wt Readings from Last 3 Encounters:  06/29/15 147 lb (66.679 kg)  05/16/15 148 lb (67.132 kg)  04/05/15 148 lb (67.132 kg)    Ocular: Sclerae unicteric, pupils equal, round and reactive to light Ear-nose-throat: Oropharynx clear, dentition fair Lymphatic: No cervical supraclavicular or axillary adenopathy Lungs no rales or rhonchi, good excursion bilaterally Heart regular rate and rhythm, no murmur appreciated Abd soft, nontender, positive bowel sounds, no liver or spleen tip palpated on exam, no fluid wave  MSK no focal  spinal tenderness, no joint edema Neuro: non-focal, well-oriented, appropriate affect Breasts: Deferred   Lab Results  Component Value Date   WBC 6.8 06/29/2015   HGB 12.6 06/29/2015   HCT 36.8 06/29/2015   MCV 87 06/29/2015   PLT 311 06/29/2015   Lab Results  Component Value Date   FERRITIN 10 05/16/2015   IRON 27* 05/16/2015   TIBC 478* 05/16/2015   UIBC 451* 05/16/2015   IRONPCTSAT 6* 05/16/2015   Lab Results  Component Value Date   RETICCTPCT 2.1 05/18/2014   RBC 4.25 06/29/2015   RETICCTABS 85.3 05/18/2014   No results found for: KPAFRELGTCHN, LAMBDASER, KAPLAMBRATIO No results found for: IGGSERUM, IGA, IGMSERUM No results found for: Georgann Housekeeper, MSPIKE, SPEI   Chemistry      Component Value Date/Time   NA  142 12/17/2013 2000   K 4.1 12/17/2013 2000   CL 103 12/17/2013 2000   CO2 27 12/17/2013 2000   BUN 9 12/17/2013 2000   CREATININE 0.87 12/17/2013 2000   CREATININE 0.77 07/29/2013 0847      Component Value Date/Time   CALCIUM 10.1 12/17/2013 2000   ALKPHOS 90 12/17/2013 2000   AST 28 12/17/2013 2000   ALT 32 12/17/2013 2000   BILITOT 0.4 12/17/2013 2000     Impression and Plan: Lynn Harvey is a very pleasant 50 yo white female with iron deficiency anemia. She received Feraheme earlier this year and has had a nice response. She is asymptomatic at this time.  Her Hgb is now 12.6 with an MCV of 87. We will see what her iron studies show and bring her in later this week or next for an infusion if necessary.  We will plan to see her back in 3 months for labs and follow-up.  She will contact us with any questions or concerns. We can certainly see her much sooner if needed.   Verdie Mosher, NP 5/10/20171:36 PM

## 2015-06-30 LAB — IRON AND TIBC
%SAT: 16 % — AB (ref 21–57)
Iron: 54 ug/dL (ref 41–142)
TIBC: 341 ug/dL (ref 236–444)
UIBC: 287 ug/dL (ref 120–384)

## 2015-06-30 LAB — FERRITIN: Ferritin: 378 ng/ml — ABNORMAL HIGH (ref 9–269)

## 2015-07-01 ENCOUNTER — Other Ambulatory Visit: Payer: Self-pay | Admitting: *Deleted

## 2015-07-01 DIAGNOSIS — D509 Iron deficiency anemia, unspecified: Secondary | ICD-10-CM

## 2015-07-01 NOTE — Telephone Encounter (Signed)
Closed Encounter  °

## 2015-07-04 DIAGNOSIS — I781 Nevus, non-neoplastic: Secondary | ICD-10-CM | POA: Insufficient documentation

## 2015-07-04 DIAGNOSIS — M79672 Pain in left foot: Secondary | ICD-10-CM | POA: Insufficient documentation

## 2015-07-05 ENCOUNTER — Ambulatory Visit (HOSPITAL_BASED_OUTPATIENT_CLINIC_OR_DEPARTMENT_OTHER): Payer: BLUE CROSS/BLUE SHIELD

## 2015-07-05 VITALS — BP 114/56 | HR 74 | Temp 98.0°F | Resp 16

## 2015-07-05 DIAGNOSIS — D509 Iron deficiency anemia, unspecified: Secondary | ICD-10-CM

## 2015-07-05 MED ORDER — SODIUM CHLORIDE 0.9 % IV SOLN
510.0000 mg | Freq: Once | INTRAVENOUS | Status: AC
  Administered 2015-07-05: 510 mg via INTRAVENOUS
  Filled 2015-07-05: qty 17

## 2015-07-05 MED ORDER — SODIUM CHLORIDE 0.9 % IV SOLN
Freq: Once | INTRAVENOUS | Status: AC
  Administered 2015-07-05: 12:00:00 via INTRAVENOUS

## 2015-07-05 NOTE — Patient Instructions (Signed)

## 2015-07-19 ENCOUNTER — Ambulatory Visit (INDEPENDENT_AMBULATORY_CARE_PROVIDER_SITE_OTHER): Payer: BLUE CROSS/BLUE SHIELD | Admitting: Cardiology

## 2015-07-19 ENCOUNTER — Encounter: Payer: Self-pay | Admitting: Cardiology

## 2015-07-19 VITALS — BP 122/78 | HR 75 | Ht 63.0 in | Wt 146.0 lb

## 2015-07-19 DIAGNOSIS — D509 Iron deficiency anemia, unspecified: Secondary | ICD-10-CM | POA: Diagnosis not present

## 2015-07-19 DIAGNOSIS — I779 Disorder of arteries and arterioles, unspecified: Secondary | ICD-10-CM

## 2015-07-19 DIAGNOSIS — I739 Peripheral vascular disease, unspecified: Secondary | ICD-10-CM

## 2015-07-19 DIAGNOSIS — Z79899 Other long term (current) drug therapy: Secondary | ICD-10-CM | POA: Diagnosis not present

## 2015-07-19 DIAGNOSIS — I2510 Atherosclerotic heart disease of native coronary artery without angina pectoris: Secondary | ICD-10-CM

## 2015-07-19 DIAGNOSIS — E785 Hyperlipidemia, unspecified: Secondary | ICD-10-CM | POA: Diagnosis not present

## 2015-07-19 DIAGNOSIS — I10 Essential (primary) hypertension: Secondary | ICD-10-CM

## 2015-07-19 DIAGNOSIS — Z9861 Coronary angioplasty status: Secondary | ICD-10-CM

## 2015-07-19 MED ORDER — LOSARTAN POTASSIUM 50 MG PO TABS: 50.0000 mg | ORAL_TABLET | Freq: Every day | ORAL | Status: DC

## 2015-07-19 MED ORDER — METOPROLOL TARTRATE 25 MG PO TABS: 25.0000 mg | ORAL_TABLET | Freq: Two times a day (BID) | ORAL | Status: DC

## 2015-07-19 MED ORDER — NITROGLYCERIN 0.4 MG SL SUBL: 0.4000 mg | SUBLINGUAL_TABLET | SUBLINGUAL | Status: DC | PRN

## 2015-07-19 MED ORDER — ATORVASTATIN CALCIUM 40 MG PO TABS: 40.0000 mg | ORAL_TABLET | Freq: Every day | ORAL | Status: DC

## 2015-07-19 NOTE — Assessment & Plan Note (Signed)
Due for lipids and CMET 

## 2015-07-19 NOTE — Assessment & Plan Note (Signed)
Controlled.  

## 2015-07-19 NOTE — Assessment & Plan Note (Signed)
Dec 2015-Mild to moderate, soft plaque bilaterally. Doppler demonstrates 40-59% stenosis. Follow-up study recommended in 6-9 months. Will order follow up

## 2015-07-19 NOTE — Patient Instructions (Addendum)
Medication Instructions: Your physician recommends that you continue on your current medications as directed. Please refer to the Current Medication list given to you today.  Labwork: Your physician recommends that you return for lab work at your earliest convenience - FASTING.  Testing/Procedures: 1. Carotid Duplex - Your physician has requested that you have a carotid duplex. This test is an ultrasound of the carotid arteries in your neck. It looks at blood flow through these arteries that supply the brain with blood. Allow one hour for this exam. There are no restrictions or special instructions.  Follow-up: Corine Shelter, PA-C, recommends that you schedule a follow-up appointment in 1 year with Dr Tresa Endo. You will receive a reminder letter in the mail two months in advance. If you don't receive a letter, please call our office to schedule the follow-up appointment.  If you need a refill on your cardiac medications before your next appointment, please call your pharmacy.   Your physician encouraged you to lose weight for better health.

## 2015-07-19 NOTE — Assessment & Plan Note (Signed)
Negative GI work up 

## 2015-07-19 NOTE — Progress Notes (Signed)
07/19/2015 Lynn Harvey   09-21-1965  [id number]  Primary Physician JEFFERY,[name], PA-C Primary Cardiologist: Dr Tresa Endo  HPI:  50 year old Caucasian female, with prior history of tobacco use. She presented May 6th 2015 with a NSTEMI.. Cardiac catheterization revealed a 30% stenosis in the proximal LAD prior to the takeoff of the septal perforating artery and diagonal vessel and just beyond this diagonal was 85% LAD stenosis extending up to the diagonal takeoff. The left circumflex and RCA were normal. Ejection fraction was 50% . She underwent successful cutting balloon angioplasty with excellent angiographic results (she did not receive a stent). She developed an episode of recurrent chest pain in a setting of significant anxiety on Jul 17 2013. She was rehospitalized and repeat cardiac catheterization was done by Dr. Clifton James which showed a patent cutting balloon PCI site without restenosis.  She presented to the ER 12/17/13 again with chest pain. Troponin was negative x 3. Exercise Myoview on 12/18/13 was electrically abnormal but images showed no ischemia or area of infarction and normal LVF. She has done well since. She quit smoking after her MI. She has gained some wgt  and is a little frustrated about that. She is in the office for routine follow up. She denies any angina. She has been evaluated for anemia and apparently had a negative GI work up.    Current Outpatient Prescriptions  Medication Sig Dispense Refill  . amphetamine-dextroamphetamine (ADDERALL) 15 MG tablet Take 0.5 tablets by mouth 2 (two) times daily. 60 tablet 0  . aspirin EC 81 MG tablet Take 81 mg by mouth daily.    Marland Kitchen atorvastatin (LIPITOR) 40 MG tablet Take 1 tablet (40 mg total) by mouth daily at 6 PM. 30 tablet 11  . buPROPion (WELLBUTRIN XL) 150 MG 24 hr tablet Take 2 tablets (300 mg total) by mouth daily. 60 tablet 5  . HYDROcodone-acetaminophen (NORCO/VICODIN) 5-325 MG tablet Take 1 tablet by mouth 2  (two) times daily as needed. Back pain  0  . losartan (COZAAR) 50 MG tablet Take 1 tablet (50 mg total) by mouth daily. 30 tablet 11  . metoprolol tartrate (LOPRESSOR) 25 MG tablet Take 1 tablet (25 mg total) by mouth 2 (two) times daily. 60 tablet 11  . nitroGLYCERIN (NITROSTAT) 0.4 MG SL tablet Place 1 tablet (0.4 mg total) under the tongue every 5 (five) minutes as needed for chest pain. 25 tablet 3  . valACYclovir (VALTREX) 500 MG tablet Take 500 mg by mouth 3 (three) times daily as needed (for fever blister). Reported on 03/16/2015    . zolpidem (AMBIEN) 5 MG tablet Take 5 mg by mouth at bedtime.      No current facility-administered medications for this visit.    Allergies  Allergen Reactions  . Buprenorphine Hcl Nausea And Vomiting  . Morphine Nausea And Vomiting  . Morphine And Related Nausea And Vomiting  . Ace Inhibitors Cough    Social History   Social History  . Marital Status: Married    Spouse Name: [name]  . Number of Children: 3  . Years of Education: 12   Occupational History  . CUSTOMER SERVICE [employer]   Social History Main Topics  . Smoking status: Former Smoker -- 2.00 packs/day for 20 years    Types: Cigarettes    Start date: 12/25/1982  . Smokeless tobacco: Never Used     Comment: quit 06/24/13  . Alcohol Use: 14.4 oz/week    24 Cans of beer per  week     Comment: more since fall 2012  . Drug Use: No  . Sexual Activity:    Partners: Male    Birth Control/ Protection: Surgical   Other Topics Concern  . Not on file   Social History Narrative   Lives with her husband, their son [name], and her daughter [name] and Amber's son [name].     Review of Systems: General: negative for chills, fever, night sweats or weight changes.  Cardiovascular: negative for chest pain, dyspnea on exertion, edema, orthopnea, palpitations, paroxysmal nocturnal dyspnea or shortness of breath Dermatological: negative for rash Respiratory: negative for cough or  wheezing Urologic: negative for hematuria Abdominal: negative for nausea, vomiting, diarrhea, bright red blood per rectum, melena, or hematemesis Neurologic: negative for visual changes, syncope, or dizziness All other systems reviewed and are otherwise negative except as noted above.    Blood pressure 122/78, pulse 75, height 5\' 3"  (1.6 m), weight 146 lb (66.225 kg).  General appearance: alert, cooperative, no distress and mildly obese Neck: no JVD and soft RCA bruit Lungs: clear to auscultation bilaterally Heart: regular rate and rhythm Extremities: extremities normal, atraumatic, no cyanosis or edema Skin: Skin color, texture, turgor normal. No rashes or lesions Neurologic: Grossly normal  EKG NSR  ASSESSMENT AND PLAN:   CAD S/P LAD DES May 2015 Restudy a few days later showed patent LAD PCI site, Myoview Oct 2015 was low risk  HTN (hypertension) Controlled  Hyperlipidemia Due for lipids and CMET  Iron deficiency anemia Negative GI work up  Bilateral carotid artery disease (HCC) Dec 2015-Mild to moderate, soft plaque bilaterally. Doppler demonstrates 40-59% stenosis. Follow-up study recommended in 6-9 months. Will order follow up   PLAN  I commended her on continuing to be a non smoker . I encouraged her to take up walking for exercise. I did not change her medications. I did order a CMET and fasting lipid panel and follow up carotid dopplers. F/U with Dr Tresa Endo in a year.   Corine Shelter PA-C 07/19/2015 1:41 PM

## 2015-07-19 NOTE — Assessment & Plan Note (Signed)
Restudy a few days later showed patent LAD PCI site, Myoview Oct 2015 was low risk

## 2015-07-29 ENCOUNTER — Ambulatory Visit (HOSPITAL_COMMUNITY)
Admission: RE | Admit: 2015-07-29 | Discharge: 2015-07-29 | Disposition: A | Discharge status: Home / Self Care | Payer: BLUE CROSS/BLUE SHIELD | Source: Ambulatory Visit | Attending: Cardiology | Admitting: Cardiology

## 2015-07-29 DIAGNOSIS — F419 Anxiety disorder, unspecified: Secondary | ICD-10-CM | POA: Diagnosis not present

## 2015-07-29 DIAGNOSIS — G4700 Insomnia, unspecified: Secondary | ICD-10-CM | POA: Diagnosis not present

## 2015-07-29 DIAGNOSIS — I6523 Occlusion and stenosis of bilateral carotid arteries: Secondary | ICD-10-CM | POA: Insufficient documentation

## 2015-07-29 DIAGNOSIS — I779 Disorder of arteries and arterioles, unspecified: Secondary | ICD-10-CM | POA: Insufficient documentation

## 2015-07-29 DIAGNOSIS — F329 Major depressive disorder, single episode, unspecified: Secondary | ICD-10-CM | POA: Insufficient documentation

## 2015-07-29 DIAGNOSIS — I10 Essential (primary) hypertension: Secondary | ICD-10-CM | POA: Insufficient documentation

## 2015-07-29 DIAGNOSIS — I2510 Atherosclerotic heart disease of native coronary artery without angina pectoris: Secondary | ICD-10-CM | POA: Diagnosis not present

## 2015-07-29 DIAGNOSIS — I739 Peripheral vascular disease, unspecified: Secondary | ICD-10-CM

## 2015-08-17 DIAGNOSIS — F411 Generalized anxiety disorder: Secondary | ICD-10-CM | POA: Insufficient documentation

## 2015-08-17 DIAGNOSIS — F339 Major depressive disorder, recurrent, unspecified: Secondary | ICD-10-CM | POA: Insufficient documentation

## 2015-08-18 DIAGNOSIS — R682 Dry mouth, unspecified: Secondary | ICD-10-CM | POA: Insufficient documentation

## 2015-08-18 DIAGNOSIS — R768 Other specified abnormal immunological findings in serum: Secondary | ICD-10-CM | POA: Insufficient documentation

## 2015-09-29 ENCOUNTER — Other Ambulatory Visit (HOSPITAL_BASED_OUTPATIENT_CLINIC_OR_DEPARTMENT_OTHER): Payer: BLUE CROSS/BLUE SHIELD

## 2015-09-29 ENCOUNTER — Encounter: Payer: Self-pay | Admitting: Family

## 2015-09-29 ENCOUNTER — Ambulatory Visit (HOSPITAL_BASED_OUTPATIENT_CLINIC_OR_DEPARTMENT_OTHER): Payer: BLUE CROSS/BLUE SHIELD | Admitting: Family

## 2015-09-29 VITALS — BP 116/79 | HR 77 | Temp 98.1°F | Resp 18 | Ht 63.0 in | Wt 148.0 lb

## 2015-09-29 DIAGNOSIS — D509 Iron deficiency anemia, unspecified: Secondary | ICD-10-CM

## 2015-09-29 LAB — CBC WITH DIFFERENTIAL (CANCER CENTER ONLY)
BASO#: 0 10*3/uL (ref 0.0–0.2)
BASO%: 0.3 % (ref 0.0–2.0)
EOS ABS: 0.3 10*3/uL (ref 0.0–0.5)
EOS%: 5.2 % (ref 0.0–7.0)
HEMATOCRIT: 35 % (ref 34.8–46.6)
HEMOGLOBIN: 12.2 g/dL (ref 11.6–15.9)
LYMPH#: 1.8 10*3/uL (ref 0.9–3.3)
LYMPH%: 27 % (ref 14.0–48.0)
MCH: 32.3 pg (ref 26.0–34.0)
MCHC: 34.9 g/dL (ref 32.0–36.0)
MCV: 93 fL (ref 81–101)
MONO#: 0.5 10*3/uL (ref 0.1–0.9)
MONO%: 7.3 % (ref 0.0–13.0)
NEUT%: 60.2 % (ref 39.6–80.0)
NEUTROS ABS: 4 10*3/uL (ref 1.5–6.5)
Platelets: 248 10*3/uL (ref 145–400)
RBC: 3.78 10*6/uL (ref 3.70–5.32)
RDW: 13 % (ref 11.1–15.7)
WBC: 6.6 10*3/uL (ref 3.9–10.0)

## 2015-09-29 NOTE — Progress Notes (Signed)
Hematology and Oncology Follow Up Visit  Lynn Harvey [id number] 1965-11-06 50 y.o. 09/29/2015   Principle Diagnosis:  Iron deficiency anemia   Current Therapy:   IV iron as indicated - last received in May 2017    Interim History:  Lynn Harvey is here today for a follow-up. She is doing fairly well but having some fatigue. She last received in [place] in May. Her iron saturation at that time was 16% with a ferritin of 378. She is seeing rheumatology and is being tested for Lupus. She has a maculopapular rash on her arms and legs and has also been experiencing dry mouth.   No fever, chills, n/v, cough, rash, dizziness, chest pain, palpitations, abdominal pain or changes in bowel or bladder habits.  No swelling or tenderness in her extremities. The numbness and tinging in her hands due to degenerative discdisease in her cervical and lumbar spine. No new aches or pains.   She has maintained a good appetite and is staying well hydrated. Her weight is unchanged.  Medications:    Medication List       Accurate as of 09/29/15  1:20 PM. Always use your most recent med list.          amphetamine-dextroamphetamine 15 MG tablet Commonly known as:  ADDERALL Take 0.5 tablets by mouth 2 (two) times daily.   aspirin EC 81 MG tablet Take 81 mg by mouth daily.   atorvastatin 40 MG tablet Commonly known as:  LIPITOR Take 1 tablet (40 mg total) by mouth daily at 6 PM.   buPROPion 150 MG 24 hr tablet Commonly known as:  WELLBUTRIN XL Take 2 tablets (300 mg total) by mouth daily.   HYDROcodone-acetaminophen 5-325 MG tablet Commonly known as:  NORCO/VICODIN Take 1 tablet by mouth 2 (two) times daily as needed. Back pain   hydroxychloroquine 200 MG tablet Commonly known as:  PLAQUENIL Take 1.5 tablets by mouth once a day.   losartan 50 MG tablet Commonly known as:  COZAAR Take 1 tablet (50 mg total) by mouth daily.   metoprolol tartrate 25 MG tablet Commonly known as:   LOPRESSOR Take 1 tablet (25 mg total) by mouth 2 (two) times daily.   nitroGLYCERIN 0.4 MG SL tablet Commonly known as:  NITROSTAT Place 1 tablet (0.4 mg total) under the tongue every 5 (five) minutes as needed for chest pain.   pilocarpine 5 MG tablet Commonly known as:  SALAGEN Take 5 mg by mouth.   valACYclovir 500 MG tablet Commonly known as:  VALTREX Take 500 mg by mouth 3 (three) times daily as needed (for fever blister). Reported on 03/16/2015   zolpidem 5 MG tablet Commonly known as:  AMBIEN Take 5 mg by mouth at bedtime.       Allergies:  Allergies  Allergen Reactions  . Buprenorphine Hcl Nausea And Vomiting  . Morphine Nausea And Vomiting  . Morphine And Related Nausea And Vomiting  . Ace Inhibitors Cough    Past Medical History, Surgical history, Social history, and Family History were reviewed and updated.  Review of Systems: All other 10 point review of systems is negative.   Physical Exam:  height is  (1.6 m) and weight is 148 lb (67.1 kg). Her oral temperature is 98.1 F (36.7 C). Her blood pressure is 116/79 and her pulse is 77. Her respiration is 18.   Wt Readings from Last 3 Encounters:  09/29/15 148 lb (67.1 kg)  07/19/15 146 lb (66.2 kg)  06/29/15  147 lb (66.7 kg)    Ocular: Sclerae unicteric, pupils equal, round and reactive to light Ear-nose-throat: Oropharynx clear, dentition fair Lymphatic: No cervical supraclavicular or axillary adenopathy Lungs no rales or rhonchi, good excursion bilaterally Heart regular rate and rhythm, no murmur appreciated Abd soft, nontender, positive bowel sounds, no liver or spleen tip palpated on exam, no fluid wave  MSK no focal spinal tenderness, no joint edema Neuro: non-focal, well-oriented, appropriate affect Breasts: Deferred   Lab Results  Component Value Date   WBC 6.6 09/29/2015   HGB 12.2 09/29/2015   HCT 35.0 09/29/2015   MCV 93 09/29/2015   PLT 248 09/29/2015   Lab Results  Component  Value Date   FERRITIN 378 (H) 06/29/2015   IRON 54 06/29/2015   TIBC 341 06/29/2015   UIBC 287 06/29/2015   IRONPCTSAT 16 (L) 06/29/2015   Lab Results  Component Value Date   RETICCTPCT 2.1 05/18/2014   RBC 3.78 09/29/2015   RETICCTABS 85.3 05/18/2014   No results found for: KPAFRELGTCHN, LAMBDASER, KAPLAMBRATIO No results found for: IGGSERUM, IGA, IGMSERUM No results found for: Georgann Housekeeper, MSPIKE, SPEI   Chemistry      Component Value Date/Time   NA 142 12/17/2013 2000   K 4.1 12/17/2013 2000   CL 103 12/17/2013 2000   CO2 27 12/17/2013 2000   BUN 9 12/17/2013 2000   CREATININE 0.87 12/17/2013 2000   CREATININE 0.77 07/29/2013 0847      Component Value Date/Time   CALCIUM 10.1 12/17/2013 2000   ALKPHOS 90 12/17/2013 2000   AST 28 12/17/2013 2000   ALT 32 12/17/2013 2000   BILITOT 0.4 12/17/2013 2000     Impression and Plan: Lynn Harvey is a very pleasant 50 yo white female with iron deficiency anemia. She is having some fatigue and her last Feraheme infusion was in May. Her Hgb is stable at 12.2 with an MCV 93.  We will see what her iron studies show and plan to bring her back in next week for an infusion if needed.  We will plan to see her back in 3 months for labs and follow-up.  She will contact us with any questions or concerns. We can certainly see her much sooner if needed.   Verdie Mosher, NP 8/10/20171:20 PM

## 2015-09-30 LAB — IRON AND TIBC
%SAT: 22 % (ref 21–57)
Iron: 66 ug/dL (ref 41–142)
TIBC: 298 ug/dL (ref 236–444)
UIBC: 231 ug/dL (ref 120–384)

## 2015-09-30 LAB — FERRITIN: Ferritin: 344 ng/ml — ABNORMAL HIGH (ref 9–269)

## 2015-10-03 ENCOUNTER — Other Ambulatory Visit: Payer: Self-pay | Admitting: Cardiovascular Disease

## 2015-12-06 ENCOUNTER — Telehealth: Payer: Self-pay | Admitting: Cardiology

## 2015-12-06 DIAGNOSIS — I252 Old myocardial infarction: Secondary | ICD-10-CM | POA: Insufficient documentation

## 2015-12-07 NOTE — Telephone Encounter (Signed)
Dr Allyson Sabal spoke with Dr Ellyn Hack directly via DOD telephone line. This was a DOD call.

## 2015-12-19 DIAGNOSIS — R945 Abnormal results of liver function studies: Secondary | ICD-10-CM

## 2015-12-19 DIAGNOSIS — R7989 Other specified abnormal findings of blood chemistry: Secondary | ICD-10-CM | POA: Insufficient documentation

## 2015-12-20 DIAGNOSIS — M3500 Sicca syndrome, unspecified: Secondary | ICD-10-CM | POA: Insufficient documentation

## 2015-12-20 DIAGNOSIS — Z79899 Other long term (current) drug therapy: Secondary | ICD-10-CM | POA: Insufficient documentation

## 2016-01-05 ENCOUNTER — Ambulatory Visit (HOSPITAL_BASED_OUTPATIENT_CLINIC_OR_DEPARTMENT_OTHER): Payer: BLUE CROSS/BLUE SHIELD | Admitting: Family

## 2016-01-05 ENCOUNTER — Other Ambulatory Visit (HOSPITAL_BASED_OUTPATIENT_CLINIC_OR_DEPARTMENT_OTHER): Payer: BLUE CROSS/BLUE SHIELD

## 2016-01-05 VITALS — BP 136/73 | HR 74 | Temp 97.3°F | Resp 18 | Wt 147.8 lb

## 2016-01-05 DIAGNOSIS — D509 Iron deficiency anemia, unspecified: Secondary | ICD-10-CM | POA: Diagnosis not present

## 2016-01-05 LAB — CBC WITH DIFFERENTIAL (CANCER CENTER ONLY)
BASO#: 0 10*3/uL (ref 0.0–0.2)
BASO%: 0.1 % (ref 0.0–2.0)
EOS%: 1 % (ref 0.0–7.0)
Eosinophils Absolute: 0.1 10*3/uL (ref 0.0–0.5)
HEMATOCRIT: 35.4 % (ref 34.8–46.6)
HEMOGLOBIN: 12.1 g/dL (ref 11.6–15.9)
LYMPH#: 1.9 10*3/uL (ref 0.9–3.3)
LYMPH%: 25.9 % (ref 14.0–48.0)
MCH: 31.2 pg (ref 26.0–34.0)
MCHC: 34.2 g/dL (ref 32.0–36.0)
MCV: 91 fL (ref 81–101)
MONO#: 0.6 10*3/uL (ref 0.1–0.9)
MONO%: 8.3 % (ref 0.0–13.0)
NEUT%: 64.7 % (ref 39.6–80.0)
NEUTROS ABS: 4.7 10*3/uL (ref 1.5–6.5)
Platelets: 267 10*3/uL (ref 145–400)
RBC: 3.88 10*6/uL (ref 3.70–5.32)
RDW: 12.9 % (ref 11.1–15.7)
WBC: 7.3 10*3/uL (ref 3.9–10.0)

## 2016-01-05 NOTE — Progress Notes (Signed)
Hematology and Oncology Follow Up Visit  Lynn Harvey [id number] 11/19/1965 50 y.o. 01/05/2016   Principle Diagnosis:  Iron deficiency anemia   Current Therapy:   IV iron as indicated - last received in May 2017    Interim History:  [name] is here today for a follow-up. She is doing well and feeling better. Her Rheumatologist is treating her for possible Lupus with plaquenil and salagen. Her joint pain and rash have resolved since starting these medications.  She still has occasional fatigue. Hgb is stable at 12.1 with an MCV of 91. Iron studies pending.  Her last dose of Feraheme was in May of this year.  No fever, chills, n/v, cough, rash, dizziness, SOB, chest pain, palpitations, abdominal pain or changes in bowel or bladder habits.  No swelling or tenderness in her extremities. The numbness and tinging in her hands due to degenerative disc disease in her cervical and lumbar spine is unchanged. No new aches or pains.   She has maintained a good appetite and is staying well hydrated. Her weight is unchanged.  Medications:    Medication List       Accurate as of 01/05/16  2:21 PM. Always use your most recent med list.          amphetamine-dextroamphetamine 15 MG tablet Commonly known as:  ADDERALL Take 0.5 tablets by mouth 2 (two) times daily.   aspirin EC 81 MG tablet Take 81 mg by mouth daily.   atorvastatin 40 MG tablet Commonly known as:  LIPITOR Take 1 tablet (40 mg total) by mouth daily at 6 PM.   buPROPion 150 MG 24 hr tablet Commonly known as:  WELLBUTRIN XL Take 2 tablets (300 mg total) by mouth daily.   HYDROcodone-acetaminophen 5-325 MG tablet Commonly known as:  NORCO/VICODIN Take 1 tablet by mouth 2 (two) times daily as needed. Back pain   hydroxychloroquine 200 MG tablet Commonly known as:  PLAQUENIL Take 1.5 tablets by mouth once a day.   losartan 50 MG tablet Commonly known as:  COZAAR Take 1 tablet (50 mg total) by mouth daily.     metoprolol tartrate 25 MG tablet Commonly known as:  LOPRESSOR Take 1 tablet (25 mg total) by mouth 2 (two) times daily.   nitroGLYCERIN 0.4 MG SL tablet Commonly known as:  NITROSTAT Place 1 tablet (0.4 mg total) under the tongue every 5 (five) minutes as needed for chest pain.   pilocarpine 5 MG tablet Commonly known as:  SALAGEN Take 5 mg by mouth.   valACYclovir 500 MG tablet Commonly known as:  VALTREX Take 500 mg by mouth 3 (three) times daily as needed (for fever blister). Reported on 03/16/2015   zolpidem 5 MG tablet Commonly known as:  AMBIEN Take 5 mg by mouth at bedtime.       Allergies:  Allergies  Allergen Reactions  . Buprenorphine Hcl Nausea And Vomiting  . Morphine Nausea And Vomiting  . Morphine And Related Nausea And Vomiting  . Ace Inhibitors Cough    Past Medical History, Surgical history, Social history, and Family History were reviewed and updated.  Review of Systems: All other 10 point review of systems is negative.   Physical Exam:  weight is 147 lb 12.8 oz (67 kg). Her oral temperature is 97.3 F (36.3 C). Her blood pressure is 136/73 and her pulse is 74. Her respiration is 18 and oxygen saturation is 99%.   Wt Readings from Last 3 Encounters:  01/05/16 147 lb 12.8  oz (67 kg)  09/29/15 148 lb (67.1 kg)  07/19/15 146 lb (66.2 kg)    Ocular: Sclerae unicteric, pupils equal, round and reactive to light Ear-nose-throat: Oropharynx clear, dentition fair Lymphatic: No cervical supraclavicular or axillary adenopathy Lungs no rales or rhonchi, good excursion bilaterally Heart regular rate and rhythm, no murmur appreciated Abd soft, nontender, positive bowel sounds, no liver or spleen tip palpated on exam, no fluid wave  MSK no focal spinal tenderness, no joint edema Neuro: non-focal, well-oriented, appropriate affect Breasts: Deferred   Lab Results  Component Value Date   WBC 7.3 01/05/2016   HGB 12.1 01/05/2016   HCT 35.4 01/05/2016    MCV 91 01/05/2016   PLT 267 01/05/2016   Lab Results  Component Value Date   FERRITIN 344 (H) 09/29/2015   IRON 66 09/29/2015   TIBC 298 09/29/2015   UIBC 231 09/29/2015   IRONPCTSAT 22 09/29/2015   Lab Results  Component Value Date   RETICCTPCT 2.1 05/18/2014   RBC 3.88 01/05/2016   RETICCTABS 85.3 05/18/2014   No results found for: KPAFRELGTCHN, LAMBDASER, KAPLAMBRATIO No results found for: IGGSERUM, IGA, IGMSERUM No results found for: Dorene Ar, A1GS, Sondra Barges, MSPIKE, SPEI   Chemistry      Component Value Date/Time   NA 142 12/17/2013 2000   K 4.1 12/17/2013 2000   CL 103 12/17/2013 2000   CO2 27 12/17/2013 2000   BUN 9 12/17/2013 2000   CREATININE 0.87 12/17/2013 2000   CREATININE 0.77 07/29/2013 0847      Component Value Date/Time   CALCIUM 10.1 12/17/2013 2000   ALKPHOS 90 12/17/2013 2000   AST 28 12/17/2013 2000   ALT 32 12/17/2013 2000   BILITOT 0.4 12/17/2013 2000     Impression and Plan: [name] is a very pleasant 50 yo white female with iron deficiency anemia. She is feeling much better since rheumatology started treating her for what is felt to be Lupus. She is doing well on her new medications and her CBC today looks good.  WBC count is 7.3 and Hgb 12.2. We will see what her iron studies show and plan to bring her back in next week for an infusion if needed.  We will plan to see her back in 4 months for labs and follow-up.  She will contact us with any questions or concerns. We can certainly see her much sooner if needed.   Verdie Mosher, NP 11/16/20172:21 PM

## 2016-01-06 LAB — IRON AND TIBC
%SAT: 12 % — AB (ref 21–57)
Iron: 45 ug/dL (ref 41–142)
TIBC: 385 ug/dL (ref 236–444)
UIBC: 340 ug/dL (ref 120–384)

## 2016-01-06 LAB — FERRITIN: FERRITIN: 62 ng/mL (ref 9–269)

## 2016-01-09 ENCOUNTER — Telehealth: Payer: Self-pay | Admitting: *Deleted

## 2016-01-09 NOTE — Telephone Encounter (Addendum)
Message left for patient. Message given to scheduler  ----- Message from Verdie Mosher, NP sent at 01/06/2016 11:42 AM EST ----- Regarding: iron  Iron studies are still low. She will need one dose of Feraheem next week please. Thank you!  Sarah   ----- Message ----- From: Interface, Lab In Three Zero One Sent: 01/05/2016   1:32 PM To: Verdie Mosher, NP

## 2016-01-17 ENCOUNTER — Ambulatory Visit (HOSPITAL_BASED_OUTPATIENT_CLINIC_OR_DEPARTMENT_OTHER): Payer: BLUE CROSS/BLUE SHIELD

## 2016-01-17 DIAGNOSIS — D509 Iron deficiency anemia, unspecified: Secondary | ICD-10-CM | POA: Diagnosis not present

## 2016-01-17 MED ORDER — SODIUM CHLORIDE 0.9 % IV SOLN
510.0000 mg | Freq: Once | INTRAVENOUS | Status: AC
  Administered 2016-01-17: 510 mg via INTRAVENOUS
  Filled 2016-01-17: qty 17

## 2016-01-17 MED ORDER — SODIUM CHLORIDE 0.9 % IV SOLN
Freq: Once | INTRAVENOUS | Status: AC
  Administered 2016-01-17: 13:00:00 via INTRAVENOUS

## 2016-01-17 NOTE — Patient Instructions (Signed)
Ferumoxytol injection What is this medicine? FERUMOXYTOL is an iron complex. Iron is used to make healthy red blood cells, which carry oxygen and nutrients throughout the body. This medicine is used to treat iron deficiency anemia in people with chronic kidney disease. COMMON BRAND NAME(S): Feraheme What should I tell my health care provider before I take this medicine? They need to know if you have any of these conditions: -anemia not caused by low iron levels -high levels of iron in the blood -magnetic resonance imaging (MRI) test scheduled -an unusual or allergic reaction to iron, other medicines, foods, dyes, or preservatives -pregnant or trying to get pregnant -breast-feeding How should I use this medicine? This medicine is for injection into a vein. It is given by a health care professional in a hospital or clinic setting. Talk to your pediatrician regarding the use of this medicine in children. Special care may be needed. What if I miss a dose? It is important not to miss your dose. Call your doctor or health care professional if you are unable to keep an appointment. What may interact with this medicine? This medicine may interact with the following medications: -other iron products What should I watch for while using this medicine? Visit your doctor or healthcare professional regularly. Tell your doctor or healthcare professional if your symptoms do not start to get better or if they get worse. You may need blood work done while you are taking this medicine. You may need to follow a special diet. Talk to your doctor. Foods that contain iron include: whole grains/cereals, dried fruits, beans, or peas, leafy green vegetables, and organ meats (liver, kidney). What side effects may I notice from receiving this medicine? Side effects that you should report to your doctor or health care professional as soon as possible: -allergic reactions like skin rash, itching or hives, swelling of the  face, lips, or tongue -breathing problems -changes in blood pressure -feeling faint or lightheaded, falls -fever or chills -flushing, sweating, or hot feelings -swelling of the ankles or feet Side effects that usually do not require medical attention (report to your doctor or health care professional if they continue or are bothersome): -diarrhea -headache -nausea, vomiting -stomach pain Where should I keep my medicine? This drug is given in a hospital or clinic and will not be stored at home.  2017 Elsevier/Gold Standard (2015-03-10 12:41:49)  

## 2016-05-04 ENCOUNTER — Other Ambulatory Visit: Payer: BLUE CROSS/BLUE SHIELD

## 2016-05-04 ENCOUNTER — Ambulatory Visit: Payer: BLUE CROSS/BLUE SHIELD | Admitting: Family

## 2016-05-15 ENCOUNTER — Other Ambulatory Visit: Payer: BLUE CROSS/BLUE SHIELD

## 2016-05-15 ENCOUNTER — Ambulatory Visit: Payer: BLUE CROSS/BLUE SHIELD | Admitting: Family

## 2016-06-05 ENCOUNTER — Other Ambulatory Visit (HOSPITAL_BASED_OUTPATIENT_CLINIC_OR_DEPARTMENT_OTHER): Payer: BLUE CROSS/BLUE SHIELD

## 2016-06-05 ENCOUNTER — Ambulatory Visit (HOSPITAL_BASED_OUTPATIENT_CLINIC_OR_DEPARTMENT_OTHER): Payer: BLUE CROSS/BLUE SHIELD | Admitting: Family

## 2016-06-05 VITALS — BP 140/76 | HR 71 | Temp 98.1°F | Resp 17 | Wt 148.4 lb

## 2016-06-05 DIAGNOSIS — D509 Iron deficiency anemia, unspecified: Secondary | ICD-10-CM

## 2016-06-05 LAB — CBC WITH DIFFERENTIAL (CANCER CENTER ONLY)
BASO#: 0 10*3/uL (ref 0.0–0.2)
BASO%: 0.2 % (ref 0.0–2.0)
EOS ABS: 0.1 10*3/uL (ref 0.0–0.5)
EOS%: 1.7 % (ref 0.0–7.0)
HEMATOCRIT: 39.5 % (ref 34.8–46.6)
HEMOGLOBIN: 13.6 g/dL (ref 11.6–15.9)
LYMPH#: 1.9 10*3/uL (ref 0.9–3.3)
LYMPH%: 29.5 % (ref 14.0–48.0)
MCH: 31.9 pg (ref 26.0–34.0)
MCHC: 34.4 g/dL (ref 32.0–36.0)
MCV: 93 fL (ref 81–101)
MONO#: 0.5 10*3/uL (ref 0.1–0.9)
MONO%: 8.4 % (ref 0.0–13.0)
NEUT%: 60.2 % (ref 39.6–80.0)
NEUTROS ABS: 3.9 10*3/uL (ref 1.5–6.5)
Platelets: 264 10*3/uL (ref 145–400)
RBC: 4.26 10*6/uL (ref 3.70–5.32)
RDW: 12.3 % (ref 11.1–15.7)
WBC: 6.4 10*3/uL (ref 3.9–10.0)

## 2016-06-05 NOTE — Progress Notes (Signed)
Hematology and Oncology Follow Up Visit  GLYNNIS GAVEL [id number] 10/01/1965 51 y.o. 06/05/2016   Principle Diagnosis:  Iron deficiency anemia   Current Therapy:   IV iron as indicated - last received in November 2017    Interim History:  Ms. Muska is here today for follow-up. She is doing well but having some episodes of fatigue and "brain fog". She last received IV iron in November 2017. Iron saturation at that time was 12% and ferritin 62. Hgb is 13.6 with an MCV of 93.  She has had no fever, chills, n/v, cough, rash, dizziness, SOB, chest pain, palpitations, abdominal pain or changes in bowel or bladder habits.  No swelling or tenderness in her extremities. The numbness and tingling in her fingertips comes and goes.  She has had some joint aches and pains recently. She is treated by rheumatology for lupus and has a follow-up appointment later this month.  She has maintained a good appetite and is staying well hydrated. Her weight is stable.   ECOG Performance Status: 1 - Symptomatic but completely ambulatory  Medications:  Allergies as of 06/05/2016      Reactions   Buprenorphine Hcl Nausea And Vomiting   Morphine Nausea And Vomiting   Morphine And Related Nausea And Vomiting   Ace Inhibitors Cough      Medication List       Accurate as of 06/05/16  1:19 PM. Always use your most recent med list.          amphetamine-dextroamphetamine 15 MG tablet Commonly known as:  ADDERALL Take 0.5 tablets by mouth 2 (two) times daily.   aspirin EC 81 MG tablet Take 81 mg by mouth daily.   atorvastatin 40 MG tablet Commonly known as:  LIPITOR Take 1 tablet (40 mg total) by mouth daily at 6 PM.   buPROPion 150 MG 24 hr tablet Commonly known as:  WELLBUTRIN XL Take 2 tablets (300 mg total) by mouth daily.   HYDROcodone-acetaminophen 5-325 MG tablet Commonly known as:  NORCO/VICODIN Take 1 tablet by mouth 2 (two) times daily as needed. Back pain   hydroxychloroquine 200 MG  tablet Commonly known as:  PLAQUENIL Take 1.5 tablets by mouth once a day.   losartan 50 MG tablet Commonly known as:  COZAAR Take 1 tablet (50 mg total) by mouth daily.   metoprolol tartrate 25 MG tablet Commonly known as:  LOPRESSOR Take 1 tablet (25 mg total) by mouth 2 (two) times daily.   nitroGLYCERIN 0.4 MG SL tablet Commonly known as:  NITROSTAT Place 1 tablet (0.4 mg total) under the tongue every 5 (five) minutes as needed for chest pain.   pilocarpine 5 MG tablet Commonly known as:  SALAGEN Take 5 mg by mouth.   valACYclovir 500 MG tablet Commonly known as:  VALTREX Take 500 mg by mouth 3 (three) times daily as needed (for fever blister). Reported on 03/16/2015   zolpidem 5 MG tablet Commonly known as:  AMBIEN Take 5 mg by mouth at bedtime.       Allergies:  Allergies  Allergen Reactions  . Buprenorphine Hcl Nausea And Vomiting  . Morphine Nausea And Vomiting  . Morphine And Related Nausea And Vomiting  . Ace Inhibitors Cough    Past Medical History, Surgical history, Social history, and Family History were reviewed and updated.  Review of Systems: All other 10 point review of systems is negative.   Physical Exam:  vitals were not taken for this visit.  Wt Readings  from Last 3 Encounters:  01/05/16 147 lb 12.8 oz (67 kg)  09/29/15 148 lb (67.1 kg)  07/19/15 146 lb (66.2 kg)    Ocular: Sclerae unicteric, pupils equal, round and reactive to light Ear-nose-throat: Oropharynx clear, dentition fair Lymphatic: No cervical, supraclavicular or axillary adenopathy Lungs no rales or rhonchi, good excursion bilaterally Heart regular rate and rhythm, no murmur appreciated Abd soft, nontender, positive bowel sounds, no liver or spleen tip palpated on exam, no fluid wave MSK no focal spinal tenderness, no joint edema Neuro: non-focal, well-oriented, appropriate affect Breasts: Deferred  Lab Results  Component Value Date   WBC 6.4 06/05/2016   HGB 13.6  06/05/2016   HCT 39.5 06/05/2016   MCV 93 06/05/2016   PLT 264 06/05/2016   Lab Results  Component Value Date   FERRITIN 62 01/05/2016   IRON 45 01/05/2016   TIBC 385 01/05/2016   UIBC 340 01/05/2016   IRONPCTSAT 12 (L) 01/05/2016   Lab Results  Component Value Date   RETICCTPCT 2.1 05/18/2014   RBC 4.26 06/05/2016   RETICCTABS 85.3 05/18/2014   No results found for: KPAFRELGTCHN, LAMBDASER, KAPLAMBRATIO No results found for: IGGSERUM, IGA, IGMSERUM No results found for: Georgann Housekeeper, MSPIKE, SPEI   Chemistry      Component Value Date/Time   NA 142 12/17/2013 2000   K 4.1 12/17/2013 2000   CL 103 12/17/2013 2000   CO2 27 12/17/2013 2000   BUN 9 12/17/2013 2000   CREATININE 0.87 12/17/2013 2000   CREATININE 0.77 07/29/2013 0847      Component Value Date/Time   CALCIUM 10.1 12/17/2013 2000   ALKPHOS 90 12/17/2013 2000   AST 28 12/17/2013 2000   ALT 32 12/17/2013 2000   BILITOT 0.4 12/17/2013 2000      Impression and Plan: Ms. Chow is a very pleasant 51 yo white female with iron deficiency anemia. She is symptomatic with fatigue and "brain fog".  We will see what her iron studies show and bring her back in next week for an infusion if needed.  I spent 15 minute face to face counseling with the patient.  We will go ahead and plan to see her back in 4 months for labs and follow-up.  She will contact us with any questions or concerns. We can certainly see her much sooner if needed.  Verdie Mosher, NP 4/17/20181:19 PM

## 2016-06-06 ENCOUNTER — Telehealth: Payer: Self-pay | Admitting: *Deleted

## 2016-06-06 LAB — IRON AND TIBC
%SAT: 24 % (ref 21–57)
Iron: 79 ug/dL (ref 41–142)
TIBC: 331 ug/dL (ref 236–444)
UIBC: 252 ug/dL (ref 120–384)

## 2016-06-06 LAB — FERRITIN: Ferritin: 115 ng/ml (ref 9–269)

## 2016-06-06 NOTE — Telephone Encounter (Addendum)
Patient is aware of results.   ----- Message from Verdie Mosher, NP sent at 06/06/2016  9:20 AM EDT ----- Regarding: Iron  Iron studies look great!!! No infusion needed at this time. Thank you!!!  Sarah  ----- Message ----- From: Interface, Lab In Three Zero One Sent: 06/05/2016   1:09 PM To: Verdie Mosher, NP

## 2016-07-12 ENCOUNTER — Telehealth: Payer: Self-pay | Admitting: Cardiovascular Disease

## 2016-07-12 NOTE — Telephone Encounter (Signed)
New message    Pt is calling to ask if it's ok for her PCP to increase her adderal back up to the normal dose she was on before her heart attack.

## 2016-07-13 ENCOUNTER — Other Ambulatory Visit: Payer: Self-pay | Admitting: Cardiology

## 2016-07-13 NOTE — Telephone Encounter (Signed)
Rx has been sent to the pharmacy electronically. ° °

## 2016-07-13 NOTE — Telephone Encounter (Signed)
Will defer to Dr Tresa Endo for advice.

## 2016-07-16 NOTE — Telephone Encounter (Signed)
Increase slowly, only to dose that is necessary and its possible patient may not need the prior dose.

## 2016-07-17 NOTE — Telephone Encounter (Signed)
Any specific doses?

## 2016-07-18 ENCOUNTER — Ambulatory Visit: Payer: BLUE CROSS/BLUE SHIELD | Admitting: Cardiovascular Disease

## 2016-07-22 ENCOUNTER — Encounter: Payer: Self-pay | Admitting: Student

## 2016-07-22 NOTE — Progress Notes (Signed)
Cardiology Office Note    Date:  07/23/2016   ID:  Lynn Harvey, DOB May 28, 1965, MRN [id number]  PCP:  Lynn Harvey., MD  Cardiologist: Dr. Tresa Harvey   Chief Complaint  Patient presents with  . Follow-up    39-month follow-up    History of Present Illness:    Lynn Harvey is a 51 y.o. female with past medical history of CAD (s/p NSTEMI in 2015 with angioplasty alone to mid-LAD, repeat cath within the same month showing a patent site), HTN, HLD, bilateral carotid artery stenosis, and iron deficiency anemia who presents to the office today for annual follow-up.   She was last examined by Lynn Shelter, PA-C in 06/2015 and reported doing well from a cardiac perspective at that time. She was undergoing workup for chronic anemia. Repeat carotid dopplers were obtained and showed 1-39% bilateral stenosis which was improved when compared to prior imaging.   In talking with the patient today, she reports doing well over the past year. She denies any recent chest discomfort or dyspnea on exertion. No palpitations, orthopnea, PND, lower extremity edema, lightheadedness, dizziness, or presyncope. Her hemoglobin has stabilized and she no longer requires iron transfusions. She was diagnosed with Lupus in the past year and has been started on Plaquenil with significant improvement in her joint pain.  She is not overly active at baseline but denies any exertional symptoms when carrying out household chores. Reports SBP is usually in the 110's to 120's when checked at home. Recent Lipid Panel was obtained by PCP and is available in Care Everywhere. Reports her PCP wishes to titrate her Adderall if it is safe to do so from a cardiac perspective.    Past Medical History:  Diagnosis Date  . Allergy   . Anxiety   . Coronary artery disease    a. s/p NSTEMI in 2015 with angioplasty alone to mid-LAD, repeat cath within the same month showing a patent site  . Depression   . Gestational diabetes   .  Hypertension   . Insomnia   . Lupus   . Myocardial infarction (HCC) 06/2013  . Rectal abnormality     Past Surgical History:  Procedure Laterality Date  . ABDOMINAL HYSTERECTOMY    . COLON SURGERY     Flexible Sigmoidoscopy  . COLONOSCOPY N/A 01/08/2014   Procedure: COLONOSCOPY;  Surgeon: Lynn Belfast, MD;  Location: [facility];  Service: Endoscopy;  Laterality: N/A;  . ESOPHAGOGASTRODUODENOSCOPY N/A 01/08/2014   Procedure: ESOPHAGOGASTRODUODENOSCOPY (EGD);  Surgeon: Lynn Belfast, MD;  Location: [facility];  Service: Endoscopy;  Laterality: N/A;  . LEFT HEART CATHETERIZATION WITH CORONARY ANGIOGRAM N/A 06/24/2013   Procedure: LEFT HEART CATHETERIZATION WITH CORONARY ANGIOGRAM;  Surgeon: Lynn Bihari, MD;  Location: [facility] CATH LAB;  Service: Cardiovascular;  Laterality: N/A;  . LEFT HEART CATHETERIZATION WITH CORONARY ANGIOGRAM N/A 07/17/2013   Procedure: LEFT HEART CATHETERIZATION WITH CORONARY ANGIOGRAM;  Surgeon: Lynn Hazel, MD;  Location: [facility] CATH LAB;  Service: Cardiovascular;  Laterality: N/A;  . TONSILECTOMY, ADENOIDECTOMY, BILATERAL MYRINGOTOMY AND TUBES    . WRIST SURGERY  1987   RIGHT; bone from forearm grafted to wrist    Current Medications: Outpatient Medications Prior to Visit  Medication Sig Dispense Refill  . amphetamine-dextroamphetamine (ADDERALL) 15 MG tablet Take 0.5 tablets by mouth 2 (two) times daily. 60 tablet 0  . aspirin EC 81 MG tablet Take 81 mg by mouth daily.    Marland Kitchen buPROPion (WELLBUTRIN XL) 150 MG 24  hr tablet Take 2 tablets (300 mg total) by mouth daily. 60 tablet 5  . HYDROcodone-acetaminophen (NORCO/VICODIN) 5-325 MG tablet Take 1 tablet by mouth 2 (two) times daily as needed. Back pain  0  . hydroxychloroquine (PLAQUENIL) 200 MG tablet Take 1.5 tablets by mouth once a day.    . nitroGLYCERIN (NITROSTAT) 0.4 MG SL tablet Place 1 tablet (0.4 mg total) under the tongue every 5 (five) minutes as needed for chest pain. 25 tablet 3  .  pilocarpine (SALAGEN) 5 MG tablet Take 5 mg by mouth.    . valACYclovir (VALTREX) 500 MG tablet Take 500 mg by mouth 3 (three) times daily as needed (for fever blister). Reported on 03/16/2015    . zolpidem (AMBIEN) 5 MG tablet Take 5 mg by mouth at bedtime.     Marland Kitchen atorvastatin (LIPITOR) 40 MG tablet TAKE 1 TABLET (40 MG TOTAL) BY MOUTH DAILY AT 6 PM. 30 tablet 2  . losartan (COZAAR) 50 MG tablet Take 1 tablet (50 mg total) by mouth daily. 30 tablet 11  . metoprolol tartrate (LOPRESSOR) 25 MG tablet Take 1 tablet (25 mg total) by mouth 2 (two) times daily. 60 tablet 11  . lamoTRIgine (LAMICTAL) 25 MG tablet Take 25 mg by mouth.  3   No facility-administered medications prior to visit.      Allergies:   Buprenorphine hcl; Morphine; Morphine and related; and Ace inhibitors   Social History   Social History  . Marital status: Married    Spouse name: Lynn Harvey  . Number of children: 3  . Years of education: 12   Occupational History  . CUSTOMER SERVICE [employer]   Social History Main Topics  . Smoking status: Former Smoker    Packs/day: 2.00    Years: 20.00    Types: Cigarettes    Start date: 12/25/1982  . Smokeless tobacco: Never Used     Comment: quit 06/24/13  . Alcohol use 14.4 oz/week    24 Cans of beer per week     Comment: more since fall 2012  . Drug use: No  . Sexual activity: Yes    Partners: Male    Birth control/ protection: Surgical   Other Topics Concern  . None   Social History Narrative   Lives with her husband, their son Lynn Harvey, and her daughter Lynn Harvey's son Lynn Harvey.     Family History:  The patient's family history includes Barrett's esophagus in her father; COPD in her mother; Cancer in her maternal grandfather and mother; Depression in her father and sister; Diabetes in her maternal grandfather, maternal grandmother, paternal grandfather, and paternal grandmother; Heart disease in her maternal grandmother and paternal grandfather; Heart disease  (age of onset: 35) in her father; Kidney disease in her paternal grandmother; Mental illness in her sister and son.   Review of Systems:   Please see the history of present illness.     General:  No chills, fever, night sweats or weight changes. Positive for joint pain (improved).  Cardiovascular:  No chest pain, dyspnea on exertion, edema, orthopnea, palpitations, paroxysmal nocturnal dyspnea. Dermatological: No rash, lesions/masses Respiratory: No cough, dyspnea Urologic: No hematuria, dysuria Abdominal:   No nausea, vomiting, diarrhea, bright red blood per rectum, melena, or hematemesis Neurologic:  No visual changes, wkns, changes in mental status.  All other systems reviewed and are otherwise negative except as noted above.   Physical Exam:    VS:  BP 118/72   Pulse 68  Ht 5\' 3"  (1.6 m)   Wt 146 lb (66.2 kg)   BMI 25.86 kg/m    General: Well developed, well nourished Caucasian female appearing in no acute distress. Head: Normocephalic, atraumatic, sclera non-icteric, no xanthomas, nares are without discharge.  Neck: No carotid bruits. JVD not elevated.  Lungs: Respirations regular and unlabored, without wheezes or rales.  Heart: Regular rate and rhythm. No S3 or S4.  No murmur, no rubs, or gallops appreciated. Abdomen: Soft, non-tender, non-distended with normoactive bowel sounds. No hepatomegaly. No rebound/guarding. No obvious abdominal masses. Msk:  Strength and tone appear normal for age. No joint deformities or effusions. Extremities: No clubbing or cyanosis. No lower extremity edema.  Distal pedal pulses are 2+ bilaterally. Neuro: Alert and oriented X 3. Moves all extremities spontaneously. No focal deficits noted. Psych:  Responds to questions appropriately with a normal affect. Skin: No rashes or lesions noted  Wt Readings from Last 3 Encounters:  07/23/16 146 lb (66.2 kg)  06/05/16 148 lb 6.4 oz (67.3 kg)  01/05/16 147 lb 12.8 oz (67 kg)     Studies/Labs  Reviewed:   EKG:  EKG is ordered today.  The ekg ordered today demonstrates NSR, HR 68, with nonspecific ST changes (similar to prior tracings).   Recent Labs: 06/05/2016: HGB 13.6; Platelets 264   Lipid Panel    Component Value Date/Time   CHOL 119 07/29/2013 0847   TRIG 119 07/29/2013 0847   HDL 37 (L) 07/29/2013 0847   CHOLHDL 3.2 07/29/2013 0847   VLDL 24 07/29/2013 0847   LDLCALC 58 07/29/2013 0847    Additional studies/ records that were reviewed today include:   Cardiac Catheterization: 06/2013 ANGIOGRAPHY:   The left main coronary artery was angiographically normal vesse which was short and immediately bifurcated into the LAD and left circumflex coronary artery.  The LAD was a moderate size vessel with proximal calcification.  There was a 30% stenosis proximally prior to the takeoff of the septal perforating artery and just after the septal takeoff was a diagonal vessel.  There was an 85% stenosis in the LAD immediately distal and extending up to the diagonal takeoff.  The remainder of the LAD was free of significant disease.   The left circumflex coronary artery was anatomically normal and gave rise to two major bifurcating obtuse marginal branch.   The RCA was angiographically normal it gave rise to a large PDA and PLA vessel.   Left ventriculography revealed low normal LV function with moderate anterolateral hypocontractility . Ejection fraction is 50%    Following successful percutaneous coronary intervention to the LAD with angioscoped cutting balloon.  The 85% stenosis was reduced to 0%.  There was no evidence for dissection.  There is no evidence for plaque shifting into the diagonal vessel.   IMPRESSION:  Acute coronary syndrome resulting in low normal acute LV dysfunction with mild to moderate anterolateral hypocontractility and ejection fraction of 50%.  Single-vessel coronary artery disease with proximal LAD calcification, proximal 30% narrowing  before the septal perforator and diagonal vessel with focal 85% stenosis just distal and up to the diagonal vessel.  Normal left circumflex and right coronary arteries.  Successful percutaneous coronary intervention with cutting balloon Angiosculpt to the LAD with the 85% stenosis being reduced to 0%, with TIMI-3 flow, and no evidence for dissection  RECOMMENDATION:  Initial treatment with dual antiplatelet therapy in light of her ACS, beta blocker, ACE-I, statin, and smoking cessation.  Since an excellent result was obtained with Angiosculpt  scoring cutting balloon, the LAD was not stented, so as not to jail the diagonal vessel and  not to land the stent in the proximal mild-to-moderate LAD disease segment.    Assessment:    1. Coronary artery disease involving native coronary artery of native heart without angina pectoris   2. Essential hypertension   3. Hyperlipidemia LDL goal <70   4. Bilateral carotid artery disease (HCC)   5. Attention deficit hyperactivity disorder (ADHD), unspecified ADHD type      Plan:   In order of problems listed above:  1. CAD - s/p NSTEMI in 2015 with angioplasty alone to mid-LAD, repeat cath within the same month showing a patent site. - she denies any recent chest pain or dyspnea on exertion. EKG today is without acute ischemic changes.  - continue ASA, statin, and BB therapy.   2. HTN - BP is at 118/72 during today's visit.  - continue Losartan 50mg  daily and Lopressor 25mg  BID.   3. HLD - recent Lipid Panel from 5/22 showed total cholesterol of 133, HDL 46, and LDL 69 (at goal with LDL < 70).  - continue Atorvastatin 40mg  daily.   4. Carotid Stenosis - carotid dopplers in 07/2015 showed 1-39% bilateral stenosis which was improved when compared to prior imaging. - continue ASA and statin therapy.    5. ADHD - her PCP is interested in increasing her Adderall dosing. It is safe to do at this time as she is 3 years out from her MI, would  just recommend gradual titration.    Medication Adjustments/Labs and Tests Ordered: Current medicines are reviewed at length with the patient today.  Concerns regarding medicines are outlined above.  Medication changes, Labs and Tests ordered today are listed in the Patient Instructions below. Patient Instructions  Medication Instructions:  NO CHANGES  If you need a refill on your cardiac medications before your next appointment, please call your pharmacy.  Follow-Up: Your physician wants you to follow-up in: 12 MONTHS WITH DR Lynn Harvey You will receive a reminder letter in the mail two months in advance. If you don't receive a letter, please call our office April 2019 to schedule the June 2019 follow-up appointment.   Thank you for choosing [facility] at [facility], Ellsworth Lennox, PA-C  07/23/2016 11:19 AM    [facility] [address] [place], [address] [place], [place]  [address] Phone: [phone]; Fax: [phone]  [address], [address] [place], [place] [address] Phone: [phone]

## 2016-07-23 ENCOUNTER — Ambulatory Visit (INDEPENDENT_AMBULATORY_CARE_PROVIDER_SITE_OTHER): Payer: BLUE CROSS/BLUE SHIELD | Admitting: Student

## 2016-07-23 ENCOUNTER — Encounter: Payer: Self-pay | Admitting: Student

## 2016-07-23 ENCOUNTER — Other Ambulatory Visit: Payer: Self-pay | Admitting: Cardiology

## 2016-07-23 VITALS — BP 118/72 | HR 68 | Ht 63.0 in | Wt 146.0 lb

## 2016-07-23 DIAGNOSIS — E785 Hyperlipidemia, unspecified: Secondary | ICD-10-CM | POA: Diagnosis not present

## 2016-07-23 DIAGNOSIS — I779 Disorder of arteries and arterioles, unspecified: Secondary | ICD-10-CM

## 2016-07-23 DIAGNOSIS — I10 Essential (primary) hypertension: Secondary | ICD-10-CM | POA: Diagnosis not present

## 2016-07-23 DIAGNOSIS — I739 Peripheral vascular disease, unspecified: Secondary | ICD-10-CM

## 2016-07-23 DIAGNOSIS — F909 Attention-deficit hyperactivity disorder, unspecified type: Secondary | ICD-10-CM

## 2016-07-23 DIAGNOSIS — I2510 Atherosclerotic heart disease of native coronary artery without angina pectoris: Secondary | ICD-10-CM

## 2016-07-23 MED ORDER — ATORVASTATIN CALCIUM 40 MG PO TABS: 40.0000 mg | ORAL_TABLET | Freq: Every day | ORAL | 3 refills | Status: DC

## 2016-07-23 MED ORDER — METOPROLOL TARTRATE 25 MG PO TABS
25.0000 mg | ORAL_TABLET | Freq: Two times a day (BID) | ORAL | 3 refills | Status: DC
Start: 2016-07-23 — End: 2016-07-23

## 2016-07-23 MED ORDER — METOPROLOL TARTRATE 25 MG PO TABS: 25.0000 mg | ORAL_TABLET | Freq: Two times a day (BID) | ORAL | 3 refills | Status: DC

## 2016-07-23 MED ORDER — LOSARTAN POTASSIUM 50 MG PO TABS: 50.0000 mg | ORAL_TABLET | Freq: Every day | ORAL | 3 refills | Status: DC

## 2016-07-23 NOTE — Patient Instructions (Signed)
Medication Instructions:  NO CHANGES  If you need a refill on your cardiac medications before your next appointment, please call your pharmacy.  Follow-Up: Your physician wants you to follow-up in: 12 MONTHS WITH DR Tresa Endo You will receive a reminder letter in the mail two months in advance. If you don't receive a letter, please call our office April 2019 to schedule the June 2019 follow-up appointment.   Thank you for choosing [facility] at [facility]!!

## 2016-07-31 ENCOUNTER — Other Ambulatory Visit: Payer: Self-pay | Admitting: Cardiology

## 2016-07-31 NOTE — Telephone Encounter (Signed)
PT NOTIFIED SHE WILL DISCUSS THIS WITH PCP

## 2016-08-24 DIAGNOSIS — R609 Edema, unspecified: Secondary | ICD-10-CM | POA: Insufficient documentation

## 2016-08-27 ENCOUNTER — Telehealth: Payer: Self-pay | Admitting: *Deleted

## 2016-08-27 ENCOUNTER — Other Ambulatory Visit: Payer: Self-pay | Admitting: *Deleted

## 2016-08-27 DIAGNOSIS — D508 Other iron deficiency anemias: Secondary | ICD-10-CM

## 2016-08-27 NOTE — Telephone Encounter (Signed)
Patient called stating she had labwork done in another doctor office and her Hgb was 8.0.  Also states she is chewing ice and feels she will need an iron infusion.  Labwork scheduled for tomorrow.

## 2016-08-28 ENCOUNTER — Other Ambulatory Visit (HOSPITAL_BASED_OUTPATIENT_CLINIC_OR_DEPARTMENT_OTHER): Payer: BLUE CROSS/BLUE SHIELD

## 2016-08-28 ENCOUNTER — Other Ambulatory Visit (HOSPITAL_COMMUNITY)
Admission: RE | Admit: 2016-08-28 | Discharge: 2016-08-28 | Disposition: A | Discharge status: Home / Self Care | Payer: BLUE CROSS/BLUE SHIELD | Source: Other Acute Inpatient Hospital | Attending: Hematology & Oncology | Admitting: Hematology & Oncology

## 2016-08-28 DIAGNOSIS — D508 Other iron deficiency anemias: Secondary | ICD-10-CM | POA: Diagnosis present

## 2016-08-28 DIAGNOSIS — D509 Iron deficiency anemia, unspecified: Secondary | ICD-10-CM

## 2016-08-28 LAB — COMPREHENSIVE METABOLIC PANEL
ALT: 75 U/L — ABNORMAL HIGH (ref 14–54)
AST: 56 U/L — ABNORMAL HIGH (ref 15–41)
Albumin: 4.1 g/dL (ref 3.5–5.0)
Alkaline Phosphatase: 57 U/L (ref 38–126)
Anion gap: 9 (ref 5–15)
BUN: 8 mg/dL (ref 6–20)
CHLORIDE: 106 mmol/L (ref 101–111)
CO2: 27 mmol/L (ref 22–32)
CREATININE: 0.7 mg/dL (ref 0.44–1.00)
Calcium: 9.5 mg/dL (ref 8.9–10.3)
Glucose, Bld: 131 mg/dL — ABNORMAL HIGH (ref 65–99)
POTASSIUM: 3.8 mmol/L (ref 3.5–5.1)
Sodium: 142 mmol/L (ref 135–145)
TOTAL PROTEIN: 6.8 g/dL (ref 6.5–8.1)

## 2016-08-28 LAB — CBC WITH DIFFERENTIAL (CANCER CENTER ONLY)
BASO#: 0 10*3/uL (ref 0.0–0.2)
BASO%: 0.4 % (ref 0.0–2.0)
EOS%: 2.2 % (ref 0.0–7.0)
Eosinophils Absolute: 0.1 10*3/uL (ref 0.0–0.5)
HCT: 24.2 % — ABNORMAL LOW (ref 34.8–46.6)
HGB: 7.6 g/dL — ABNORMAL LOW (ref 11.6–15.9)
LYMPH#: 1.6 10*3/uL (ref 0.9–3.3)
LYMPH%: 34.4 % (ref 14.0–48.0)
MCH: 28.1 pg (ref 26.0–34.0)
MCHC: 31.4 g/dL — ABNORMAL LOW (ref 32.0–36.0)
MCV: 90 fL (ref 81–101)
MONO#: 0.4 10*3/uL (ref 0.1–0.9)
MONO%: 9.1 % (ref 0.0–13.0)
NEUT#: 2.4 10*3/uL (ref 1.5–6.5)
NEUT%: 53.9 % (ref 39.6–80.0)
Platelets: 264 10*3/uL (ref 145–400)
RBC: 2.7 10*6/uL — AB (ref 3.70–5.32)
RDW: 13.2 % (ref 11.1–15.7)
WBC: 4.5 10*3/uL (ref 3.9–10.0)

## 2016-08-29 ENCOUNTER — Telehealth: Payer: Self-pay | Admitting: Family

## 2016-08-29 LAB — IRON AND TIBC
%SAT: 4 % — ABNORMAL LOW (ref 21–57)
Iron: 16 ug/dL — ABNORMAL LOW (ref 41–142)
TIBC: 409 ug/dL (ref 236–444)
UIBC: 393 ug/dL — ABNORMAL HIGH (ref 120–384)

## 2016-08-29 LAB — FERRITIN: Ferritin: 23 ng/ml (ref 9–269)

## 2016-08-29 NOTE — Telephone Encounter (Signed)
I spoke with the patient and went over her lab work with her. She is waiting for call back regarding a referral to GI with her PCP. Hgb is 7.6. Iron saturation is 4% with a ferritin of 23. She is symptomatic with fatigue and weakness at this time. We will give her 2 doses of IV iron and follow-up in 6 weeks. She is in agreement with the plan and will contact our office with any other questions or concerns.

## 2016-08-30 ENCOUNTER — Ambulatory Visit (HOSPITAL_BASED_OUTPATIENT_CLINIC_OR_DEPARTMENT_OTHER): Payer: BLUE CROSS/BLUE SHIELD

## 2016-08-30 VITALS — BP 133/62 | HR 72 | Temp 98.1°F | Resp 16

## 2016-08-30 DIAGNOSIS — D509 Iron deficiency anemia, unspecified: Secondary | ICD-10-CM

## 2016-08-30 MED ORDER — FERUMOXYTOL INJECTION 510 MG/17 ML
510.0000 mg | Freq: Once | INTRAVENOUS | Status: AC
  Administered 2016-08-30: 510 mg via INTRAVENOUS
  Filled 2016-08-30: qty 17

## 2016-08-30 NOTE — Patient Instructions (Signed)
Anemia, Nonspecific Anemia is a condition in which the concentration of red blood cells or hemoglobin in the blood is below normal. Hemoglobin is a substance in red blood cells that carries oxygen to the tissues of the body. Anemia results in not enough oxygen reaching these tissues. What are the causes? Common causes of anemia include:  Excessive bleeding. Bleeding may be internal or external. This includes excessive bleeding from periods (in women) or from the intestine.  Poor nutrition.  Chronic kidney, thyroid, and liver disease.  Bone marrow disorders that decrease red blood cell production.  Cancer and treatments for cancer.  HIV, AIDS, and their treatments.  Spleen problems that increase red blood cell destruction.  Blood disorders.  Excess destruction of red blood cells due to infection, medicines, and autoimmune disorders. What are the signs or symptoms?  Minor weakness.  Dizziness.  Headache.  Palpitations.  Shortness of breath, especially with exercise.  Paleness.  Cold sensitivity.  Indigestion.  Nausea.  Difficulty sleeping.  Difficulty concentrating. Symptoms may occur suddenly or they may develop slowly. How is this diagnosed? Additional blood tests are often needed. These help your health care provider determine the best treatment. Your health care provider will check your stool for blood and look for other causes of blood loss. How is this treated? Treatment varies depending on the cause of the anemia. Treatment can include:  Supplements of iron, vitamin B12, or folic acid.  Hormone medicines.  A blood transfusion. This may be needed if blood loss is severe.  Hospitalization. This may be needed if there is significant continual blood loss.  Dietary changes.  Spleen removal. Follow these instructions at home: Keep all follow-up appointments. It often takes many weeks to correct anemia, and having your health care provider check on your  condition and your response to treatment is very important. Get help right away if:  You develop extreme weakness, shortness of breath, or chest pain.  You become dizzy or have trouble concentrating.  You develop heavy vaginal bleeding.  You develop a rash.  You have bloody or black, tarry stools.  You faint.  You vomit up blood.  You vomit repeatedly.  You have abdominal pain.  You have a fever or persistent symptoms for more than 2-3 days.  You have a fever and your symptoms suddenly get worse.  You are dehydrated. This information is not intended to replace advice given to you by your health care provider. Make sure you discuss any questions you have with your health care provider. Document Released: 03/15/2004 Document Revised: 07/20/2015 Document Reviewed: 08/01/2012 Elsevier Interactive Patient Education  2017 [employer]  

## 2016-08-31 ENCOUNTER — Telehealth: Payer: Self-pay | Admitting: Cardiovascular Disease

## 2016-08-31 MED ORDER — NITROGLYCERIN 0.4 MG SL SUBL: 0.4000 mg | SUBLINGUAL_TABLET | SUBLINGUAL | 3 refills | Status: DC | PRN

## 2016-08-31 NOTE — Telephone Encounter (Signed)
Returned call to patient. She reports swelling and shortness of breath for about 1-1.5 weeks. Shortness of breath is with exertion. Swelling in bilateral LE - swelling is better in AM when she wakes up. Chest and throat hurts + SOB when walking. Has not used PRN NTG. She states her symptoms were bad enough last night that she thought  No weight gain noted.   She saw her PCP and was told she is severely anemic - got iron infusion yesterday and has another one next week. She states she thought the swelling was from anemia?   Patient saw B. Strader, PA about 1 month ago - no med changes since that visit.  Advised patient to use NTG PRN (refilled) and to seek ED eval for chest pain lasting >30 minutes. Unfortunately, patient does not take diuretic so cannot recommend dose adjustment. Advised to elevate legs, avoid salt, try compression socks/stockings. Informed her that our scheduling dept will be notified (via staff message) to schedule first available appt w/APP or Dr. Tresa Endo and that MD would be notified of her concerns and she would be call with advice. Informed her that on call staff is available after hours for acute concerns.

## 2016-08-31 NOTE — Telephone Encounter (Signed)
Lynn Harvey is calling because she has been having some issues with swelling in her legs ,ankles and feet. Also some shortness of breath when she walk a short distance feels like she can't breathe and her legs hurt really bad . Please call    Thanks

## 2016-08-31 NOTE — Telephone Encounter (Signed)
Spoke with DOD, Dr. Antoine Poche, who agreed w/recommendations previously noted. Patient call and notified. Waiting on appt

## 2016-09-03 NOTE — Telephone Encounter (Signed)
Patient scheduled for 09/05/16 with PA

## 2016-09-04 NOTE — Progress Notes (Signed)
Cardiology Office Note    Date:  09/05/2016   ID:  Lynn Harvey, DOB February 03, 1966, MRN [id number]  PCP:  [name], MD  Cardiologist:  Dr. Tresa Endo  Chief Complaint  Patient presents with  . Follow-up    seen for Dr. Tresa Endo    History of Present Illness:  Lynn Harvey is a 51 y.o. female with PMH of CAD (s/p NSTEMI 2015 with angioplasty alone to mid LAD, repeat cath in same month showing patent site), HTN, HLD, bilateral carotid artery stenosis, and iron deficiency anemia. Repeat carotid Doppler in 2017 showed 1-39% bilateral stenosis. She was diagnosed with lupus last year and has been started on Plaquenil with significant improvement in the joint pain.  She presents today for evaluation of chest discomfort. Her chest discomfort has been going on for the past month, it is worse with exertion and improved with rest. She also have significant amount of dyspnea on exertion and fatigue as well. She was recently found to have severe anemia. Lab work obtained on 08/28/2016 showed hemoglobin 7.6 which is decreased from the previous 13.6 on 06/06/2014. We discussed the various options, I would favor observation for now as severe anemia can cause similar symptoms. She also complaining of significant bilateral lower extremity pain with ambulation as well. She says both of her lower extremity hurts when she ambulates. I will obtain a preliminary ABI.    Past Medical History:  Diagnosis Date  . Allergy   . Anxiety   . Coronary artery disease    a. s/p NSTEMI in 2015 with angioplasty alone to mid-LAD, repeat cath within the same month showing a patent site  . Depression   . Gestational diabetes   . Hypertension   . Insomnia   . Lupus   . Myocardial infarction (HCC) 06/2013  . Rectal abnormality     Past Surgical History:  Procedure Laterality Date  . ABDOMINAL HYSTERECTOMY    . COLON SURGERY     Flexible Sigmoidoscopy  . COLONOSCOPY N/A 01/08/2014   Procedure: COLONOSCOPY;  Surgeon:  Theda Belfast, MD;  Location: [facility];  Service: Endoscopy;  Laterality: N/A;  . ESOPHAGOGASTRODUODENOSCOPY N/A 01/08/2014   Procedure: ESOPHAGOGASTRODUODENOSCOPY (EGD);  Surgeon: Theda Belfast, MD;  Location: [facility];  Service: Endoscopy;  Laterality: N/A;  . LEFT HEART CATHETERIZATION WITH CORONARY ANGIOGRAM N/A 06/24/2013   Procedure: LEFT HEART CATHETERIZATION WITH CORONARY ANGIOGRAM;  Surgeon: [name], MD;  Location: [facility] CATH LAB;  Service: Cardiovascular;  Laterality: N/A;  . LEFT HEART CATHETERIZATION WITH CORONARY ANGIOGRAM N/A 07/17/2013   Procedure: LEFT HEART CATHETERIZATION WITH CORONARY ANGIOGRAM;  Surgeon: Kathleene Hazel, MD;  Location: [facility] CATH LAB;  Service: Cardiovascular;  Laterality: N/A;  . TONSILECTOMY, ADENOIDECTOMY, BILATERAL MYRINGOTOMY AND TUBES    . WRIST SURGERY  1987   RIGHT; bone from forearm grafted to wrist    Current Medications: Outpatient Medications Prior to Visit  Medication Sig Dispense Refill  . amphetamine-dextroamphetamine (ADDERALL) 15 MG tablet Take 0.5 tablets by mouth 2 (two) times daily. 60 tablet 0  . aspirin EC 81 MG tablet Take 81 mg by mouth daily.    Marland Kitchen atorvastatin (LIPITOR) 40 MG tablet Take 1 tablet (40 mg total) by mouth daily at 6 PM. 90 tablet 3  . buPROPion (WELLBUTRIN XL) 150 MG 24 hr tablet Take 2 tablets (300 mg total) by mouth daily. 60 tablet 5  . HYDROcodone-acetaminophen (NORCO/VICODIN) 5-325 MG tablet Take 1 tablet by mouth 2 (two)  times daily as needed. Back pain  0  . hydroxychloroquine (PLAQUENIL) 200 MG tablet Take 1.5 tablets by mouth once a day.    . losartan (COZAAR) 50 MG tablet TAKE ONE TABLET ( 50 MG TOTAL) BY MOUTH DAILY 30 tablet 11  . metoprolol tartrate (LOPRESSOR) 25 MG tablet TAKE ONE TABLET (25 MG )TOTAL) BY MOUTH TWICE A DAY 60 tablet 11  . nitroGLYCERIN (NITROSTAT) 0.4 MG SL tablet Place 1 tablet (0.4 mg total) under the tongue every 5 (five) minutes as needed for chest pain. 25 tablet 3    . pilocarpine (SALAGEN) 5 MG tablet Take 5 mg by mouth.    . valACYclovir (VALTREX) 500 MG tablet Take 500 mg by mouth 3 (three) times daily as needed (for fever blister). Reported on 03/16/2015    . zolpidem (AMBIEN) 5 MG tablet Take 5 mg by mouth at bedtime.      No facility-administered medications prior to visit.      Allergies:   Buprenorphine hcl; Morphine; Morphine and related; and Ace inhibitors   Social History   Social History  . Marital status: Married    Spouse name: [name]  . Number of children: 3  . Years of education: 12   Occupational History  . CUSTOMER SERVICE [employer]   Social History Main Topics  . Smoking status: Former Smoker    Packs/day: 2.00    Years: 20.00    Types: Cigarettes    Start date: 12/25/1982  . Smokeless tobacco: Never Used     Comment: quit 06/24/13  . Alcohol use 14.4 oz/week    24 Cans of beer per week     Comment: more since fall 2012  . Drug use: No  . Sexual activity: Yes    Partners: Male    Birth control/ protection: Surgical   Other Topics Concern  . None   Social History Narrative   Lives with her husband, their son Lynn Harvey, and her daughter Hospital doctor and Amber's son Lynn Harvey.     Family History:  The patient's family history includes Barrett's esophagus in her father; COPD in her mother; Cancer in her maternal grandfather and mother; Depression in her father and sister; Diabetes in her maternal grandfather, maternal grandmother, paternal grandfather, and paternal grandmother; Heart disease in her maternal grandmother and paternal grandfather; Heart disease (age of onset: 42) in her father; Kidney disease in her paternal grandmother; Mental illness in her sister and son.   ROS:   Please see the history of present illness.    ROS All other systems reviewed and are negative.   PHYSICAL EXAM:   VS:  BP 140/82 (BP Location: Left Arm, Cuff Size: Normal)   Pulse 71   Ht 5\' 3"  (1.6 m)   Wt 140 lb 9.6 oz (63.8 kg)   BMI  24.91 kg/m    GEN: Well nourished, well developed, in no acute distress  HEENT: normal  Neck: no JVD, carotid bruits, or masses Cardiac: RRR; no murmurs, rubs, or gallops,no edema  Respiratory:  clear to auscultation bilaterally, normal work of breathing GI: soft, nontender, nondistended, + BS MS: no deformity or atrophy  Skin: warm and dry, no rash Neuro:  Alert and Oriented x 3, Strength and sensation are intact Psych: euthymic mood, full affect  Wt Readings from Last 3 Encounters:  09/05/16 140 lb 9.6 oz (63.8 kg)  07/23/16 146 lb (66.2 kg)  06/05/16 148 lb 6.4 oz (67.3 kg)      Studies/Labs Reviewed:  EKG:  EKG is ordered today.  The ekg ordered today demonstrates Normal sinus rhythm without significant ST-T wave changes  Recent Labs: 08/28/2016: ALT 75; BUN 8; Creatinine, Ser 0.70; HGB 7.6; Platelets 264; Potassium 3.8; Sodium 142   Lipid Panel    Component Value Date/Time   CHOL 119 07/29/2013 0847   TRIG 119 07/29/2013 0847   HDL 37 (L) 07/29/2013 0847   CHOLHDL 3.2 07/29/2013 0847   VLDL 24 07/29/2013 0847   LDLCALC 58 07/29/2013 0847    Additional studies/ records that were reviewed today include:   Echo 07/29/2013 LV EF: 55%  Study Conclusions  - Left ventricle: The cavity size was normal. The estimated ejection fraction was 55%. Wall motion was normal; there were no regional wall motion abnormalities. - Mitral valve: There was mild regurgitation.    ASSESSMENT:    1. Exertional chest pain   2. Leg pain, bilateral   3. Coronary artery disease involving native coronary artery of native heart with other form of angina pectoris (HCC)   4. Essential hypertension   5. Hyperlipidemia, unspecified hyperlipidemia type   6. Bilateral carotid artery stenosis   7. Iron deficiency anemia, unspecified iron deficiency anemia type      PLAN:  In order of problems listed above:  1. Exertional chest pain in the setting of severe anemia: She is receiving  iron transfusion. My recommendation is observation for the next 3 weeks, if her exertional chest discomfort worsens despite iron transfusion or if she started having resting chest pain, I would favor stress testing.  2. Lower extremity pain: Obtain preliminary ABI for initial assessment.  3. CAD: Unclear if her current symptoms represent exertional angina or demand ischemia.  4. Hypertension: Blood pressure mildly elevated today. However it was normal month ago  5. Hyperlipidemia: On Lipitor 40 mg daily.  6. Carotid artery disease: Last carotid ultrasound in 2017, 1-39% stenosis bilaterally.    Medication Adjustments/Labs and Tests Ordered: Current medicines are reviewed at length with the patient today.  Concerns regarding medicines are outlined above.  Medication changes, Labs and Tests ordered today are listed in the Patient Instructions below. Patient Instructions  Medication Instructions:   No changes.  Continue your iron supplement. Use nitroglycerin if needed for chest discomfort.  Labwork:   none  Testing/Procedures:  Your physician has requested that you have an ankle brachial index (ABI). During this test an ultrasound and blood pressure cuff are used to evaluate the arteries that supply the arms and legs with blood. Allow thirty minutes for this exam. There are no restrictions or special instructions.   Follow-Up:  With [name] PA in 3 weeks. Continue observation on your chest pain, if symptoms don't improve, or get worse, please let us know.   If you need a refill on your cardiac medications before your next appointment, please call your pharmacy.      Ramond Dial, [place]  09/05/2016 7:13 PM    [facility] [address] [place], [place], [place]  [address] Phone: [phone]; Fax: [phone]

## 2016-09-05 ENCOUNTER — Encounter: Payer: Self-pay | Admitting: Physician Assistant

## 2016-09-05 ENCOUNTER — Ambulatory Visit (INDEPENDENT_AMBULATORY_CARE_PROVIDER_SITE_OTHER): Payer: BLUE CROSS/BLUE SHIELD | Admitting: Physician Assistant

## 2016-09-05 VITALS — BP 140/82 | HR 71 | Ht 63.0 in | Wt 140.6 lb

## 2016-09-05 DIAGNOSIS — I10 Essential (primary) hypertension: Secondary | ICD-10-CM | POA: Diagnosis not present

## 2016-09-05 DIAGNOSIS — I6523 Occlusion and stenosis of bilateral carotid arteries: Secondary | ICD-10-CM | POA: Diagnosis not present

## 2016-09-05 DIAGNOSIS — M79605 Pain in left leg: Secondary | ICD-10-CM | POA: Diagnosis not present

## 2016-09-05 DIAGNOSIS — M79604 Pain in right leg: Secondary | ICD-10-CM

## 2016-09-05 DIAGNOSIS — D509 Iron deficiency anemia, unspecified: Secondary | ICD-10-CM | POA: Diagnosis not present

## 2016-09-05 DIAGNOSIS — E785 Hyperlipidemia, unspecified: Secondary | ICD-10-CM

## 2016-09-05 DIAGNOSIS — I25118 Atherosclerotic heart disease of native coronary artery with other forms of angina pectoris: Secondary | ICD-10-CM | POA: Diagnosis not present

## 2016-09-05 DIAGNOSIS — R079 Chest pain, unspecified: Secondary | ICD-10-CM

## 2016-09-05 NOTE — Patient Instructions (Addendum)
Medication Instructions:   No changes.  Continue your iron supplement. Use nitroglycerin if needed for chest discomfort.  Labwork:   none  Testing/Procedures:  Your physician has requested that you have an ankle brachial index (ABI). During this test an ultrasound and blood pressure cuff are used to evaluate the arteries that supply the arms and legs with blood. Allow thirty minutes for this exam. There are no restrictions or special instructions.   Follow-Up:  With [name] PA in 3 weeks. Continue observation on your chest pain, if symptoms don't improve, or get worse, please let us know.   If you need a refill on your cardiac medications before your next appointment, please call your pharmacy.

## 2016-09-07 ENCOUNTER — Other Ambulatory Visit: Payer: Self-pay | Admitting: Physician Assistant

## 2016-09-07 ENCOUNTER — Ambulatory Visit (HOSPITAL_BASED_OUTPATIENT_CLINIC_OR_DEPARTMENT_OTHER): Payer: BLUE CROSS/BLUE SHIELD

## 2016-09-07 VITALS — BP 137/68 | HR 70 | Temp 98.2°F | Resp 18

## 2016-09-07 DIAGNOSIS — D509 Iron deficiency anemia, unspecified: Secondary | ICD-10-CM

## 2016-09-07 DIAGNOSIS — I739 Peripheral vascular disease, unspecified: Secondary | ICD-10-CM

## 2016-09-07 MED ORDER — SODIUM CHLORIDE 0.9 % IV SOLN
510.0000 mg | Freq: Once | INTRAVENOUS | Status: AC
  Administered 2016-09-07: 510 mg via INTRAVENOUS
  Filled 2016-09-07: qty 17

## 2016-09-07 NOTE — Progress Notes (Signed)
Pt declined to wait for the 30 min observation, she stayed for 15 mins.

## 2016-09-18 ENCOUNTER — Ambulatory Visit (HOSPITAL_COMMUNITY)
Admission: RE | Admit: 2016-09-18 | Discharge: 2016-09-18 | Disposition: A | Discharge status: Home / Self Care | Payer: BLUE CROSS/BLUE SHIELD | Source: Ambulatory Visit | Attending: Cardiovascular Disease | Admitting: Cardiovascular Disease

## 2016-09-18 DIAGNOSIS — I739 Peripheral vascular disease, unspecified: Secondary | ICD-10-CM | POA: Diagnosis not present

## 2016-09-26 ENCOUNTER — Ambulatory Visit: Payer: BLUE CROSS/BLUE SHIELD | Admitting: Physician Assistant

## 2016-10-12 ENCOUNTER — Other Ambulatory Visit (HOSPITAL_BASED_OUTPATIENT_CLINIC_OR_DEPARTMENT_OTHER): Payer: BLUE CROSS/BLUE SHIELD

## 2016-10-12 ENCOUNTER — Ambulatory Visit (HOSPITAL_BASED_OUTPATIENT_CLINIC_OR_DEPARTMENT_OTHER): Payer: BLUE CROSS/BLUE SHIELD | Admitting: Family

## 2016-10-12 VITALS — BP 142/73 | HR 72 | Temp 98.3°F | Resp 16 | Wt 139.0 lb

## 2016-10-12 DIAGNOSIS — D500 Iron deficiency anemia secondary to blood loss (chronic): Secondary | ICD-10-CM

## 2016-10-12 DIAGNOSIS — L929 Granulomatous disorder of the skin and subcutaneous tissue, unspecified: Secondary | ICD-10-CM | POA: Insufficient documentation

## 2016-10-12 DIAGNOSIS — N83209 Unspecified ovarian cyst, unspecified side: Secondary | ICD-10-CM | POA: Insufficient documentation

## 2016-10-12 DIAGNOSIS — D509 Iron deficiency anemia, unspecified: Secondary | ICD-10-CM | POA: Diagnosis not present

## 2016-10-12 DIAGNOSIS — N83299 Other ovarian cyst, unspecified side: Secondary | ICD-10-CM | POA: Insufficient documentation

## 2016-10-12 LAB — CBC WITH DIFFERENTIAL (CANCER CENTER ONLY)
BASO#: 0 10*3/uL (ref 0.0–0.2)
BASO%: 0.3 % (ref 0.0–2.0)
EOS ABS: 0.1 10*3/uL (ref 0.0–0.5)
EOS%: 1.4 % (ref 0.0–7.0)
HEMATOCRIT: 40.5 % (ref 34.8–46.6)
HEMOGLOBIN: 13.7 g/dL (ref 11.6–15.9)
LYMPH#: 2 10*3/uL (ref 0.9–3.3)
LYMPH%: 34.2 % (ref 14.0–48.0)
MCH: 30.9 pg (ref 26.0–34.0)
MCHC: 33.8 g/dL (ref 32.0–36.0)
MCV: 91 fL (ref 81–101)
MONO#: 0.5 10*3/uL (ref 0.1–0.9)
MONO%: 8.9 % (ref 0.0–13.0)
NEUT%: 55.2 % (ref 39.6–80.0)
NEUTROS ABS: 3.2 10*3/uL (ref 1.5–6.5)
Platelets: 262 10*3/uL (ref 145–400)
RBC: 4.44 10*6/uL (ref 3.70–5.32)
RDW: 16.2 % — ABNORMAL HIGH (ref 11.1–15.7)
WBC: 5.8 10*3/uL (ref 3.9–10.0)

## 2016-10-12 NOTE — Progress Notes (Signed)
Hematology and Oncology Follow Up Visit  Lynn Harvey [id number] 1965/07/25 51 y.o. 10/12/2016   Principle Diagnosis:  Iron deficiency anemia   Current Therapy:   IV iron as indicated - last received in July 2018 x 2    Interim History:  Lynn Harvey is here today for follow-up. She received 2 doses of IV iron last month for a Hgb of 7.6. Hgb is now up to 13.7 and she still has occasional fatigue but is otherwise asymptomatic. She will be having a colonoscopy and endoscopy next week for further work up. She has occasional bright red blood in her stool she feels is due to hemorrhoids No other episodes of bleeding, no bruising or petechiae. No lymphadenopathy found on exam.  She has had no fever, chills, ice cravings, n/v, cough, rash, dizziness, SOB, chest pain, palpitations, abdominal pain or changes in bowel or bladder habits.  No swelling, tenderness, numbness or tingling in her extremities. No c/o pain.  She has a good appetite and is staying well hydrated. Her weight is stable.   ECOG Performance Status: 0 - Asymptomatic  Medications:  Allergies as of 10/12/2016      Reactions   Buprenorphine Hcl Nausea And Vomiting   Morphine Nausea And Vomiting   Morphine And Related Nausea And Vomiting   Ace Inhibitors Cough      Medication List       Accurate as of 10/12/16  1:35 PM. Always use your most recent med list.          amphetamine-dextroamphetamine 15 MG tablet Commonly known as:  ADDERALL Take 0.5 tablets by mouth 2 (two) times daily.   aspirin EC 81 MG tablet Take 81 mg by mouth daily.   atorvastatin 40 MG tablet Commonly known as:  LIPITOR Take 1 tablet (40 mg total) by mouth daily at 6 PM.   buPROPion 300 MG 24 hr tablet Commonly known as:  WELLBUTRIN XL   HYDROcodone-acetaminophen 5-325 MG tablet Commonly known as:  NORCO/VICODIN Take 1 tablet by mouth 2 (two) times daily as needed. Back pain   hydroxychloroquine 200 MG tablet Commonly known as:   PLAQUENIL Take 1.5 tablets by mouth once a day.   losartan 50 MG tablet Commonly known as:  COZAAR TAKE ONE TABLET ( 50 MG TOTAL) BY MOUTH DAILY   metoprolol tartrate 25 MG tablet Commonly known as:  LOPRESSOR TAKE ONE TABLET (25 MG )TOTAL) BY MOUTH TWICE A DAY   nitroGLYCERIN 0.4 MG SL tablet Commonly known as:  NITROSTAT Place 1 tablet (0.4 mg total) under the tongue every 5 (five) minutes as needed for chest pain.   pilocarpine 5 MG tablet Commonly known as:  SALAGEN Take 5 mg by mouth.   valACYclovir 500 MG tablet Commonly known as:  VALTREX Take 500 mg by mouth 3 (three) times daily as needed (for fever blister). Reported on 03/16/2015   zolpidem 5 MG tablet Commonly known as:  AMBIEN Take 5 mg by mouth at bedtime.       Allergies:  Allergies  Allergen Reactions  . Buprenorphine Hcl Nausea And Vomiting  . Morphine Nausea And Vomiting  . Morphine And Related Nausea And Vomiting  . Ace Inhibitors Cough    Past Medical History, Surgical history, Social history, and Family History were reviewed and updated.  Review of Systems: All other 10 point review of systems is negative.   Physical Exam:  weight is 139 lb (63 kg). Her oral temperature is 98.3 F (36.8 C). Her  blood pressure is 142/73 (abnormal) and her pulse is 72. Her respiration is 16 and oxygen saturation is 97%.   Wt Readings from Last 3 Encounters:  10/12/16 139 lb (63 kg)  09/05/16 140 lb 9.6 oz (63.8 kg)  07/23/16 146 lb (66.2 kg)    Ocular: Sclerae unicteric, pupils equal, round and reactive to light Ear-nose-throat: Oropharynx clear, dentition fair Lymphatic: No cervical, supraclavicular or axillary adenopathy Lungs no rales or rhonchi, good excursion bilaterally Heart regular rate and rhythm, no murmur appreciated Abd soft, nontender, positive bowel sounds, no liver or spleen tip palpated on exam, no fluid wave  MSK no focal spinal tenderness, no joint edema Neuro: non-focal, well-oriented,  appropriate affect Breasts: Deferred   Lab Results  Component Value Date   WBC 5.8 10/12/2016   HGB 13.7 10/12/2016   HCT 40.5 10/12/2016   MCV 91 10/12/2016   PLT 262 10/12/2016   Lab Results  Component Value Date   FERRITIN 23 08/28/2016   IRON 16 (L) 08/28/2016   TIBC 409 08/28/2016   UIBC 393 (H) 08/28/2016   IRONPCTSAT 4 (L) 08/28/2016   Lab Results  Component Value Date   RETICCTPCT 2.1 05/18/2014   RBC 4.44 10/12/2016   RETICCTABS 85.3 05/18/2014   No results found for: KPAFRELGTCHN, LAMBDASER, KAPLAMBRATIO No results found for: IGGSERUM, IGA, IGMSERUM No results found for: Dorene Ar, A1GS, A2GS, Karn Pickler, SPEI   Chemistry      Component Value Date/Time   NA 142 08/28/2016 1257   K 3.8 08/28/2016 1257   CL 106 08/28/2016 1257   CO2 27 08/28/2016 1257   BUN 8 08/28/2016 1257   CREATININE 0.70 08/28/2016 1257   CREATININE 0.77 07/29/2013 0847      Component Value Date/Time   CALCIUM 9.5 08/28/2016 1257   ALKPHOS 57 08/28/2016 1257   AST 56 (H) 08/28/2016 1257   ALT 75 (H) 08/28/2016 1257   BILITOT <0.1 (L) 08/28/2016 1257      Impression and Plan: Lynn Harvey is a very pleasant 51 yo caucasian female with iron deficiency anemia. She has responded nicely to the IV iron she received last month.  We will see what her iron studies show and bring her back in next week for follow-up.  She is scheduled for colonoscopy and endoscopy next week.  We will plan to see her back again in another 2 months for repeat lab work and follow-up.  She will contact our office with any questions or concerns. We can certainly see her sooner if need be.   Verdie Mosher, NP 8/24/20181:35 PM

## 2016-10-15 LAB — IRON AND TIBC
%SAT: 29 % (ref 21–57)
IRON: 89 ug/dL (ref 41–142)
TIBC: 310 ug/dL (ref 236–444)
UIBC: 221 ug/dL (ref 120–384)

## 2016-10-15 LAB — FERRITIN: Ferritin: 523 ng/ml — ABNORMAL HIGH (ref 9–269)

## 2017-07-22 ENCOUNTER — Other Ambulatory Visit: Payer: Self-pay | Admitting: Student

## 2017-08-18 ENCOUNTER — Encounter (HOSPITAL_COMMUNITY): Payer: Self-pay | Admitting: Emergency Medicine

## 2017-08-18 ENCOUNTER — Emergency Department (HOSPITAL_COMMUNITY)
Admission: EM | Admit: 2017-08-18 | Discharge: 2017-08-19 | Disposition: A | Discharge status: Home / Self Care | Payer: BLUE CROSS/BLUE SHIELD | Attending: Emergency Medicine | Admitting: Emergency Medicine

## 2017-08-18 DIAGNOSIS — Y939 Activity, unspecified: Secondary | ICD-10-CM | POA: Insufficient documentation

## 2017-08-18 DIAGNOSIS — Y92838 Other recreation area as the place of occurrence of the external cause: Secondary | ICD-10-CM | POA: Insufficient documentation

## 2017-08-18 DIAGNOSIS — S81812A Laceration without foreign body, left lower leg, initial encounter: Secondary | ICD-10-CM | POA: Diagnosis not present

## 2017-08-18 DIAGNOSIS — I10 Essential (primary) hypertension: Secondary | ICD-10-CM | POA: Insufficient documentation

## 2017-08-18 DIAGNOSIS — W458XXA Other foreign body or object entering through skin, initial encounter: Secondary | ICD-10-CM | POA: Diagnosis not present

## 2017-08-18 DIAGNOSIS — S8992XA Unspecified injury of left lower leg, initial encounter: Secondary | ICD-10-CM | POA: Diagnosis present

## 2017-08-18 DIAGNOSIS — Z87891 Personal history of nicotine dependence: Secondary | ICD-10-CM | POA: Insufficient documentation

## 2017-08-18 DIAGNOSIS — Y999 Unspecified external cause status: Secondary | ICD-10-CM | POA: Insufficient documentation

## 2017-08-18 DIAGNOSIS — Z7982 Long term (current) use of aspirin: Secondary | ICD-10-CM | POA: Diagnosis not present

## 2017-08-18 DIAGNOSIS — Z79899 Other long term (current) drug therapy: Secondary | ICD-10-CM | POA: Insufficient documentation

## 2017-08-18 NOTE — ED Triage Notes (Signed)
Pt presents with laceration to LLE, pt states [place] and cut leg on metal object on the side of the boat. 4cm wound, bleeding noted, wound cleaned and re wrapped.

## 2017-08-19 MED ORDER — TETANUS-DIPHTH-ACELL PERTUSSIS 5-2.5-18.5 LF-MCG/0.5 IM SUSP
0.5000 mL | Freq: Once | INTRAMUSCULAR | Status: AC
  Administered 2017-08-19: 0.5 mL via INTRAMUSCULAR
  Filled 2017-08-19: qty 0.5

## 2017-08-19 MED ORDER — BACITRACIN ZINC 500 UNIT/GM EX OINT
TOPICAL_OINTMENT | Freq: Two times a day (BID) | CUTANEOUS | Status: DC
  Administered 2017-08-19: 1 via TOPICAL
  Filled 2017-08-19: qty 0.9

## 2017-08-19 MED ORDER — LIDOCAINE-EPINEPHRINE (PF) 2 %-1:200000 IJ SOLN
20.0000 mL | Freq: Once | INTRAMUSCULAR | Status: AC
  Administered 2017-08-19: 20 mL via INTRADERMAL

## 2017-08-19 NOTE — ED Notes (Signed)
No answer when called for room 

## 2017-08-19 NOTE — ED Provider Notes (Signed)
[facility] EMERGENCY DEPARTMENT Provider Note   CSN: [id number] Arrival date & time: 08/18/17  2244     History   Chief Complaint Chief Complaint  Patient presents with  . Laceration    HPI Lynn Harvey is a 52 y.o. female.  Patient presents with laceration to left lower leg that happened around 7:30 tonight (08/18/17). She came up against a metal piece on the frame of her boat while stepping from the dock. No other injury. Last Tetanus unknown.  The history is provided by the patient and the spouse. No language interpreter was used.  Laceration      Past Medical History:  Diagnosis Date  . Allergy   . Anxiety   . Coronary artery disease    a. s/p NSTEMI in 2015 with angioplasty alone to mid-LAD, repeat cath within the same month showing a patent site  . Depression   . Gestational diabetes   . Hypertension   . Insomnia   . Lupus (HCC)   . Myocardial infarction (HCC) 06/2013  . Rectal abnormality     Patient Active Problem List   Diagnosis Date Noted  . Complex ovarian cyst 10/12/2016  . Presence of granulation tissue 10/12/2016  . Hemorrhagic cyst of ovary 10/12/2016  . Edema 08/24/2016  . Drug therapy 12/20/2015  . Sjogren's syndrome (HCC) 12/20/2015  . Elevated liver function tests 12/19/2015  . History of myocardial infarction 12/06/2015  . ANA positive 08/18/2015  . Mouth dryness 08/18/2015  . GAD (generalized anxiety disorder) 08/17/2015  . Recurrent major depressive disorder (HCC) 08/17/2015  . Left foot pain 07/04/2015  . Telangiectasia 07/04/2015  . Elevated glucose 05/04/2015  . Milia 05/04/2015  . Vitamin D deficiency 05/04/2015  . Anemia 04/27/2015  . Fatigue 04/27/2015  . Health care maintenance 04/27/2015  . History of MI (myocardial infarction) 04/27/2015  . Hot flashes 04/27/2015  . Low back pain radiating to both legs 03/10/2014  . Bilateral carotid artery disease (HCC) 02/17/2014  . First degree hemorrhoids  01/15/2014  . Gastroesophageal reflux disease without esophagitis 01/15/2014  . Iron deficiency anemia 01/13/2014  . RBC microcytosis 01/13/2014  . CAD S/P LAD DES May 2015 12/17/2013  . HTN (hypertension) 06/25/2013  . High cholesterol 06/25/2013  . Chest pain with moderate risk of acute coronary syndrome 06/24/2013  . NSTEMI (non-ST elevated myocardial infarction) (HCC) 06/24/2013  . Myocardial infarction (HCC) 06/19/2013  . Tobacco abuse 06/10/2012  . Allergic rhinitis 06/10/2012  . ADD (attention deficit disorder) 03/06/2012  . Depressive disorder 04/12/2011    Past Surgical History:  Procedure Laterality Date  . ABDOMINAL HYSTERECTOMY    . COLON SURGERY     Flexible Sigmoidoscopy  . COLONOSCOPY N/A 01/08/2014   Procedure: COLONOSCOPY;  Surgeon: Theda Belfast, MD;  Location: [facility] ENDOSCOPY;  Service: Endoscopy;  Laterality: N/A;  . ESOPHAGOGASTRODUODENOSCOPY N/A 01/08/2014   Procedure: ESOPHAGOGASTRODUODENOSCOPY (EGD);  Surgeon: Theda Belfast, MD;  Location: [facility];  Service: Endoscopy;  Laterality: N/A;  . LEFT HEART CATHETERIZATION WITH CORONARY ANGIOGRAM N/A 06/24/2013   Procedure: LEFT HEART CATHETERIZATION WITH CORONARY ANGIOGRAM;  Surgeon: [name], MD;  Location: [facility] CATH LAB;  Service: Cardiovascular;  Laterality: N/A;  . LEFT HEART CATHETERIZATION WITH CORONARY ANGIOGRAM N/A 07/17/2013   Procedure: LEFT HEART CATHETERIZATION WITH CORONARY ANGIOGRAM;  Surgeon: Kathleene Hazel, MD;  Location: [facility] CATH LAB;  Service: Cardiovascular;  Laterality: N/A;  . TONSILECTOMY, ADENOIDECTOMY, BILATERAL MYRINGOTOMY AND TUBES    . WRIST SURGERY  1987   RIGHT; bone from forearm grafted to wrist     OB History    Gravida  5   Para  3   Term  2   Preterm  1   AB  2   Living  3     SAB  1   TAB  0   Ectopic  1   Multiple  0   Live Births               Home Medications    Prior to Admission medications   Medication Sig Start Date End Date  Taking? Authorizing Provider  amphetamine-dextroamphetamine (ADDERALL) 15 MG tablet Take 0.5 tablets by mouth 2 (two) times daily. 05/17/14   Porfirio Oar, PA-C  aspirin EC 81 MG tablet Take 81 mg by mouth daily.    [provider]  atorvastatin (LIPITOR) 40 MG tablet TAKE 1 TABLET(40 MG) BY MOUTH DAILY AT 6 PM 07/23/17   Strader, [place] M, PA-C  buPROPion (WELLBUTRIN XL) 300 MG 24 hr tablet  10/05/16   [provider]  HYDROcodone-acetaminophen (NORCO/VICODIN) 5-325 MG tablet Take 1 tablet by mouth 2 (two) times daily as needed. Back pain 07/12/15   [provider]  hydroxychloroquine (PLAQUENIL) 200 MG tablet Take 1.5 tablets by mouth once a day. 08/18/15   [provider]  losartan (COZAAR) 50 MG tablet TAKE ONE TABLET ( 50 MG TOTAL) BY MOUTH DAILY 07/31/16   [name], MD  losartan (COZAAR) 50 MG tablet TAKE 1 TABLET(50 MG) BY MOUTH DAILY 07/23/17   [place] Ouch, [place] M, PA-C  metoprolol tartrate (LOPRESSOR) 25 MG tablet TAKE ONE TABLET (25 MG )TOTAL) BY MOUTH TWICE A DAY 07/31/16   [name], MD  nitroGLYCERIN (NITROSTAT) 0.4 MG SL tablet Place 1 tablet (0.4 mg total) under the tongue every 5 (five) minutes as needed for chest pain. 08/31/16   [name], MD  pilocarpine (SALAGEN) 5 MG tablet Take 5 mg by mouth. 08/18/15   [provider]  valACYclovir (VALTREX) 500 MG tablet Take 500 mg by mouth 3 (three) times daily as needed (for fever blister). Reported on 03/16/2015 04/13/13   [provider]  zolpidem (AMBIEN) 5 MG tablet Take 5 mg by mouth at bedtime.  12/07/14   [provider]    Family History Family History  Problem Relation Age of Onset  . Cancer Mother   . COPD Mother        emphysema  . Heart disease Father 36       s/p 3V CABG  . Depression Father   . Barrett's esophagus Father   . Mental illness Sister        depression  . Depression Sister   . Mental illness Son        ADHD, bipolar disorder  .  Diabetes Maternal Grandmother   . Heart disease Maternal Grandmother   . Diabetes Maternal Grandfather   . Cancer Maternal Grandfather        lung cancer  . Diabetes Paternal Grandmother   . Kidney disease Paternal Grandmother   . Diabetes Paternal Grandfather   . Heart disease Paternal Grandfather     Social History Social History   Tobacco Use  . Smoking status: Former Smoker    Packs/day: 2.00    Years: 20.00    Pack years: 40.00    Types: Cigarettes    Start date: 12/25/1982  . Smokeless tobacco: Never Used  . Tobacco comment:  quit 06/24/13  Substance Use Topics  . Alcohol use: Yes    Alcohol/week: 14.4 oz    Types: 24 Cans of beer per week    Comment: more since fall 2012  . Drug use: No     Allergies   Buprenorphine hcl; Morphine; Morphine and related; and Ace inhibitors   Review of Systems Review of Systems  Constitutional: Negative.   Musculoskeletal: Negative.   Skin: Positive for wound.       C/O laceration.  Neurological: Negative.      Physical Exam Updated Vital Signs BP (!) 170/88 (BP Location: Right Arm)   Pulse 78   Temp 97.6 F (36.4 C) (Oral)   Resp 16   Ht 5\' 3"  (1.6 m)   Wt 62.6 kg (138 lb)   SpO2 98%   BMI 24.45 kg/m   Physical Exam  Constitutional: She is oriented to person, place, and time. She appears well-developed and well-nourished.  Neck: Normal range of motion.  Pulmonary/Chest: Effort normal.  Neurological: She is alert and oriented to person, place, and time.  Skin: Skin is warm and dry.  Full thickness 4 cm linear laceration to anterior mid-shaft left lower leg. No contamination.     ED Treatments / Results  Labs (all labs ordered are listed, but only abnormal results are displayed) Labs Reviewed - No data to display  EKG None  Radiology No results found.  Procedures .Marland KitchenLaceration Repair Date/Time: 08/19/2017 1:48 AM Performed by: Elpidio Anis, PA-C Authorized by: Elpidio Anis, PA-C   Consent:     Consent obtained:  Verbal   Consent given by:  Patient Anesthesia (see MAR for exact dosages):    Anesthesia method:  Local infiltration   Local anesthetic:  Lidocaine 2% WITH epi Laceration details:    Location:  Leg   Leg location:  L lower leg   Length (cm):  4 Repair type:    Repair type:  Simple Pre-procedure details:    Preparation:  Patient was prepped and draped in usual sterile fashion Exploration:    Hemostasis achieved with:  Direct pressure   Wound exploration: entire depth of wound probed and visualized     Contaminated: no   Treatment:    Area cleansed with:  Saline   Amount of cleaning:  Standard   Irrigation solution:  Sterile saline   Irrigation method:  Pressure wash Skin repair:    Repair method:  Sutures   Suture size:  3-0   Suture material:  Prolene   Suture technique:  Simple interrupted   Number of sutures:  5 Approximation:    Approximation:  Close Post-procedure details:    Dressing:  Non-adherent dressing and antibiotic ointment   Patient tolerance of procedure:  Tolerated well, no immediate complications   (including critical care time)  Medications Ordered in ED Medications  lidocaine-EPINEPHrine (XYLOCAINE W/EPI) 2 %-1:200000 (PF) injection 20 mL (20 mLs Intradermal Given 08/19/17 0103)     Initial Impression / Assessment and Plan / ED Course  I have reviewed the triage vital signs and the nursing notes.  Pertinent labs & imaging results that were available during my care of the patient were reviewed by me and considered in my medical decision making (see chart for details).     Patient presents with uncomplicated laceration to left lower leg, repaired as per above note. Care instructions provided. Tetanus updated.  Final Clinical Impressions(s) / ED Diagnoses   Final diagnoses:  None   1. Left lower leg laceration  ED Discharge Orders    None       Elpidio Anis, PA-C 08/19/17 0150    Nira Conn, MD 08/20/17  [phone]

## 2017-10-09 ENCOUNTER — Other Ambulatory Visit: Payer: Self-pay | Admitting: Student

## 2017-10-09 NOTE — Telephone Encounter (Signed)
Rx sent to pharmacy   

## 2018-01-07 ENCOUNTER — Other Ambulatory Visit: Payer: Self-pay | Admitting: Cardiovascular Disease

## 2018-02-05 ENCOUNTER — Other Ambulatory Visit: Payer: Self-pay | Admitting: Cardiovascular Disease

## 2018-02-06 ENCOUNTER — Other Ambulatory Visit: Payer: Self-pay | Admitting: Cardiovascular Disease

## 2018-02-07 NOTE — Telephone Encounter (Signed)
Rx sent to pharmacy   

## 2018-05-12 ENCOUNTER — Other Ambulatory Visit: Payer: Self-pay | Admitting: Cardiovascular Disease

## 2018-05-13 ENCOUNTER — Other Ambulatory Visit: Payer: Self-pay | Admitting: Cardiovascular Disease

## 2018-07-12 ENCOUNTER — Emergency Department (HOSPITAL_COMMUNITY)
Admission: EM | Admit: 2018-07-12 | Discharge: 2018-07-12 | Disposition: A | Discharge status: Home / Self Care | Payer: 59 | Attending: Emergency Medicine | Admitting: Emergency Medicine

## 2018-07-12 ENCOUNTER — Other Ambulatory Visit: Payer: Self-pay

## 2018-07-12 ENCOUNTER — Emergency Department (HOSPITAL_COMMUNITY): Payer: 59

## 2018-07-12 ENCOUNTER — Encounter (HOSPITAL_COMMUNITY): Payer: Self-pay | Admitting: Emergency Medicine

## 2018-07-12 DIAGNOSIS — I10 Essential (primary) hypertension: Secondary | ICD-10-CM | POA: Diagnosis not present

## 2018-07-12 DIAGNOSIS — R109 Unspecified abdominal pain: Secondary | ICD-10-CM | POA: Diagnosis not present

## 2018-07-12 DIAGNOSIS — Z87891 Personal history of nicotine dependence: Secondary | ICD-10-CM | POA: Diagnosis not present

## 2018-07-12 DIAGNOSIS — Z79899 Other long term (current) drug therapy: Secondary | ICD-10-CM | POA: Diagnosis not present

## 2018-07-12 DIAGNOSIS — R7401 Elevation of levels of liver transaminase levels: Secondary | ICD-10-CM

## 2018-07-12 DIAGNOSIS — R1010 Upper abdominal pain, unspecified: Secondary | ICD-10-CM

## 2018-07-12 DIAGNOSIS — R740 Nonspecific elevation of levels of transaminase and lactic acid dehydrogenase [LDH]: Secondary | ICD-10-CM | POA: Insufficient documentation

## 2018-07-12 LAB — CBC
HCT: 41.3 % (ref 36.0–46.0)
Hemoglobin: 13.6 g/dL (ref 12.0–15.0)
MCH: 30.5 pg (ref 26.0–34.0)
MCHC: 32.9 g/dL (ref 30.0–36.0)
MCV: 92.6 fL (ref 80.0–100.0)
Platelets: 216 10*3/uL (ref 150–400)
RBC: 4.46 MIL/uL (ref 3.87–5.11)
RDW: 14.9 % (ref 11.5–15.5)
WBC: 5.4 10*3/uL (ref 4.0–10.5)
nRBC: 0 % (ref 0.0–0.2)

## 2018-07-12 LAB — I-STAT BETA HCG BLOOD, ED (MC, WL, AP ONLY): I-stat hCG, quantitative: <5 m[IU]/mL (ref <5)

## 2018-07-12 LAB — COMPREHENSIVE METABOLIC PANEL
ALT: 175 U/L — ABNORMAL HIGH (ref 0–44)
AST: 141 U/L — ABNORMAL HIGH (ref 15–41)
Albumin: 4.1 g/dL (ref 3.5–5.0)
Alkaline Phosphatase: 75 U/L (ref 38–126)
Anion gap: 12 (ref 5–15)
BUN: 5 mg/dL — ABNORMAL LOW (ref 6–20)
CO2: 28 mmol/L (ref 22–32)
Calcium: 10 mg/dL (ref 8.9–10.3)
Chloride: 98 mmol/L (ref 98–111)
Creatinine, Ser: 0.74 mg/dL (ref 0.44–1.00)
GFR calc Af Amer: >60 mL/min (ref >60)
GFR calc non Af Amer: >60 mL/min (ref >60)
Glucose, Bld: 148 mg/dL — ABNORMAL HIGH (ref 70–99)
Potassium: 3.9 mmol/L (ref 3.5–5.1)
Sodium: 138 mmol/L (ref 135–145)
Total Bilirubin: 0.9 mg/dL (ref 0.3–1.2)
Total Protein: 7.1 g/dL (ref 6.5–8.1)

## 2018-07-12 LAB — URINALYSIS, ROUTINE W REFLEX MICROSCOPIC
Bilirubin Urine: NEGATIVE
Glucose, UA: NEGATIVE mg/dL
Hgb urine dipstick: NEGATIVE
Ketones, ur: NEGATIVE mg/dL
Leukocytes,Ua: NEGATIVE
Nitrite: NEGATIVE
Protein, ur: NEGATIVE mg/dL
Specific Gravity, Urine: 1.003 — ABNORMAL LOW (ref 1.005–1.030)
pH: 8 (ref 5.0–8.0)

## 2018-07-12 LAB — LIPASE, BLOOD: Lipase: 38 U/L (ref 11–51)

## 2018-07-12 LAB — TROPONIN I: Troponin I: <0.03 ng/mL (ref <0.03)

## 2018-07-12 MED ORDER — ONDANSETRON HCL 4 MG PO TABS: 4.0000 mg | ORAL_TABLET | Freq: Three times a day (TID) | ORAL | 0 refills | Status: AC | PRN

## 2018-07-12 NOTE — ED Notes (Signed)
Patient verbalizes understanding of discharge instructions. Opportunity for questioning and answers were provided. Armband removed by staff, pt discharged from ED.  

## 2018-07-12 NOTE — Discharge Instructions (Signed)
You abdominal discomfort may be due to injury to your liver.  Please stop drinking alcohol, call and follow-up closely with your GI specialist for further management of your condition.  Return if you have any concern.

## 2018-07-12 NOTE — ED Triage Notes (Signed)
Pt in with c/o RUQ dull constant pain x a few years. States her GI doc says she has fatty liver. C/o nausea, denies any fevers or cp. Still has gallbladder and appendix

## 2018-07-12 NOTE — ED Notes (Signed)
Patient transported to Ultrasound 

## 2018-07-12 NOTE — ED Provider Notes (Signed)
[facility] EMERGENCY DEPARTMENT Provider Note   CSN: [id number] Arrival date & time: 07/12/18  0076    History   Chief Complaint Chief Complaint  Patient presents with  . Abdominal Pain  . Nausea    HPI Lynn Harvey is a 53 y.o. female.     The history is provided by the patient and medical records. No language interpreter was used.  Abdominal Pain     53 year old female with significant history of cardiac disease including a prior MI, history of hypertension, anxiety presenting for evaluation of abdominal pain.  Patient report for the past year she has had intermittent epigastric pain and for the past week, she is having increasing pain in the epigastric and right upper quadrant.  Pain is worsening with bending forward, and with eating.  She endorsed nausea without vomiting.  Pain is described as a sharp achy sensation, 5 out of 10 currently.  Today her pain radiates towards her chest and left neck which concerns her.  No report of fever chills no productive cough, shortness of breath, hemoptysis, back pain dysuria or hematuria.  Normal bowel bladder function.  She still has an intact gallbladder.  She did recall having an ultrasound of her gallbladder 2 years ago for the same complaint but it was normal.  She mentioned having prior heart attack.  She was a former smoker.  She does admits to increased alcohol use  Past Medical History:  Diagnosis Date  . Allergy   . Anxiety   . Coronary artery disease    a. s/p NSTEMI in 2015 with angioplasty alone to mid-LAD, repeat cath within the same month showing a patent site  . Depression   . Gestational diabetes   . Hypertension   . Insomnia   . Lupus (HCC)   . Myocardial infarction (HCC) 06/2013  . Rectal abnormality     Patient Active Problem List   Diagnosis Date Noted  . Complex ovarian cyst 10/12/2016  . Presence of granulation tissue 10/12/2016  . Hemorrhagic cyst of ovary 10/12/2016  . Edema  08/24/2016  . Drug therapy 12/20/2015  . Sjogren's syndrome (HCC) 12/20/2015  . Elevated liver function tests 12/19/2015  . History of myocardial infarction 12/06/2015  . ANA positive 08/18/2015  . Mouth dryness 08/18/2015  . GAD (generalized anxiety disorder) 08/17/2015  . Recurrent major depressive disorder (HCC) 08/17/2015  . Left foot pain 07/04/2015  . Telangiectasia 07/04/2015  . Elevated glucose 05/04/2015  . Milia 05/04/2015  . Vitamin D deficiency 05/04/2015  . Anemia 04/27/2015  . Fatigue 04/27/2015  . Health care maintenance 04/27/2015  . History of MI (myocardial infarction) 04/27/2015  . Hot flashes 04/27/2015  . Low back pain radiating to both legs 03/10/2014  . Bilateral carotid artery disease (HCC) 02/17/2014  . First degree hemorrhoids 01/15/2014  . Gastroesophageal reflux disease without esophagitis 01/15/2014  . Iron deficiency anemia 01/13/2014  . RBC microcytosis 01/13/2014  . CAD S/P LAD DES May 2015 12/17/2013  . HTN (hypertension) 06/25/2013  . High cholesterol 06/25/2013  . Chest pain with moderate risk of acute coronary syndrome 06/24/2013  . NSTEMI (non-ST elevated myocardial infarction) (HCC) 06/24/2013  . Myocardial infarction (HCC) 06/19/2013  . Tobacco abuse 06/10/2012  . Allergic rhinitis 06/10/2012  . ADD (attention deficit disorder) 03/06/2012  . Depressive disorder 04/12/2011    Past Surgical History:  Procedure Laterality Date  . ABDOMINAL HYSTERECTOMY    . COLON SURGERY     Flexible Sigmoidoscopy  .  COLONOSCOPY N/A 01/08/2014   Procedure: COLONOSCOPY;  Surgeon: Theda Belfast, MD;  Location: [facility] ENDOSCOPY;  Service: Endoscopy;  Laterality: N/A;  . ESOPHAGOGASTRODUODENOSCOPY N/A 01/08/2014   Procedure: ESOPHAGOGASTRODUODENOSCOPY (EGD);  Surgeon: Theda Belfast, MD;  Location: [facility];  Service: Endoscopy;  Laterality: N/A;  . LEFT HEART CATHETERIZATION WITH CORONARY ANGIOGRAM N/A 06/24/2013   Procedure: LEFT HEART CATHETERIZATION  WITH CORONARY ANGIOGRAM;  Surgeon: [name], MD;  Location: [facility] CATH LAB;  Service: Cardiovascular;  Laterality: N/A;  . LEFT HEART CATHETERIZATION WITH CORONARY ANGIOGRAM N/A 07/17/2013   Procedure: LEFT HEART CATHETERIZATION WITH CORONARY ANGIOGRAM;  Surgeon: Kathleene Hazel, MD;  Location: [facility] CATH LAB;  Service: Cardiovascular;  Laterality: N/A;  . TONSILECTOMY, ADENOIDECTOMY, BILATERAL MYRINGOTOMY AND TUBES    . WRIST SURGERY  1987   RIGHT; bone from forearm grafted to wrist     OB History    Gravida  5   Para  3   Term  2   Preterm  1   AB  2   Living  3     SAB  1   TAB  0   Ectopic  1   Multiple  0   Live Births               Home Medications    Prior to Admission medications   Medication Sig Start Date End Date Taking? Authorizing Provider  amphetamine-dextroamphetamine (ADDERALL) 15 MG tablet Take 0.5 tablets by mouth 2 (two) times daily. 05/17/14   Porfirio Oar, PA  aspirin EC 81 MG tablet Take 81 mg by mouth daily.    [provider]  atorvastatin (LIPITOR) 40 MG tablet TAKE 1 TABLET BY MOUTH DAILY AT 6 PM. PLEASE SCHEDULE AN APPOINTMENT FOR FUTURE REFILLS 01/07/18   [name], MD  buPROPion (WELLBUTRIN XL) 300 MG 24 hr tablet  10/05/16   [provider]  HYDROcodone-acetaminophen (NORCO/VICODIN) 5-325 MG tablet Take 1 tablet by mouth 2 (two) times daily as needed. Back pain 07/12/15   [provider]  hydroxychloroquine (PLAQUENIL) 200 MG tablet Take 1.5 tablets by mouth once a day. 08/18/15   [provider]  losartan (COZAAR) 50 MG tablet TAKE 1 TABLET BY MOUTH DAILY. NEED APPOINTMENT FOR FUTURE REFILLS 05/12/18   [name], MD  metoprolol tartrate (LOPRESSOR) 25 MG tablet TAKE 1 TABLET BY MOUTH TWICE DAILY 05/14/18   [name], MD  nitroGLYCERIN (NITROSTAT) 0.4 MG SL tablet Place 1 tablet (0.4 mg total) under the tongue every 5 (five) minutes as needed for chest pain. 08/31/16   [name], MD  pilocarpine (SALAGEN) 5 MG tablet Take 5 mg by mouth. 08/18/15   [provider]  valACYclovir (VALTREX) 500 MG tablet Take 500 mg by mouth 3 (three) times daily as needed (for fever blister). Reported on 03/16/2015 04/13/13   [provider]  zolpidem (AMBIEN) 5 MG tablet Take 5 mg by mouth at bedtime.  12/07/14   [provider]    Family History Family History  Problem Relation Age of Onset  . Cancer Mother   . COPD Mother        emphysema  . Heart disease Father 31       s/p 3V CABG  . Depression Father   . Barrett's esophagus Father   . Mental illness Sister        depression  . Depression Sister   . Mental illness Son  ADHD, bipolar disorder  . Diabetes Maternal Grandmother   . Heart disease Maternal Grandmother   . Diabetes Maternal Grandfather   . Cancer Maternal Grandfather        lung cancer  . Diabetes Paternal Grandmother   . Kidney disease Paternal Grandmother   . Diabetes Paternal Grandfather   . Heart disease Paternal Grandfather     Social History Social History   Tobacco Use  . Smoking status: Former Smoker    Packs/day: 2.00    Years: 20.00    Pack years: 40.00    Types: Cigarettes    Start date: 12/25/1982  . Smokeless tobacco: Never Used  . Tobacco comment: quit 06/24/13  Substance Use Topics  . Alcohol use: Yes    Alcohol/week: 24.0 standard drinks    Types: 24 Cans of beer per week    Comment: more since fall 2012  . Drug use: No     Allergies   Buprenorphine hcl; Morphine; Morphine and related; and Ace inhibitors   Review of Systems Review of Systems  Gastrointestinal: Positive for abdominal pain.  All other systems reviewed and are negative.    Physical Exam Updated Vital Signs BP (!) 189/87 (BP Location: Left Arm)   Pulse 75   Temp 97.7 F (36.5 C) (Oral)   Resp 18   Wt 63.5 kg   SpO2 100%   BMI 24.80 kg/m   Physical Exam Vitals signs and nursing note reviewed.  Constitutional:       General: She is not in acute distress.    Appearance: She is well-developed.  HENT:     Head: Atraumatic.  Eyes:     Conjunctiva/sclera: Conjunctivae normal.  Neck:     Musculoskeletal: Neck supple.  Cardiovascular:     Rate and Rhythm: Normal rate and regular rhythm.  Pulmonary:     Effort: Pulmonary effort is normal.     Breath sounds: Normal breath sounds.  Abdominal:     General: Abdomen is flat. Bowel sounds are normal.     Tenderness: There is abdominal tenderness in the right upper quadrant and epigastric area. There is guarding. Positive signs include Murphy's sign. Negative signs include McBurney's sign.  Skin:    Findings: No rash.  Neurological:     Mental Status: She is alert.      ED Treatments / Results  Labs (all labs ordered are listed, but only abnormal results are displayed) Labs Reviewed  COMPREHENSIVE METABOLIC PANEL - Abnormal; Notable for the following components:      Result Value   Glucose, Bld 148 (*)    BUN 5 (*)    AST 141 (*)    ALT 175 (*)    All other components within normal limits  URINALYSIS, ROUTINE W REFLEX MICROSCOPIC - Abnormal; Notable for the following components:   Specific Gravity, Urine 1.003 (*)    All other components within normal limits  LIPASE, BLOOD  CBC  TROPONIN I  I-STAT BETA HCG BLOOD, ED ([facility], [facility], [facility] ONLY)    EKG EKG Interpretation  Date/Time:  Saturday Jul 12 2018 09:57:00 EDT Ventricular Rate:  78 PR Interval:  160 QRS Duration: 86 QT Interval:  384 QTC Calculation: 437 R Axis:   86 Text Interpretation:  Normal sinus rhythm Nonspecific ST abnormality Abnormal ECG similar to previous Confirmed by Frederick PeersLittle, Rachel [phone]([id number]) on 07/12/2018 10:51:44 AM   Radiology [place] Abdomen Limited  Result Date: 07/12/2018 CLINICAL DATA:  Upper abdominal pain EXAM: ULTRASOUND ABDOMEN LIMITED RIGHT UPPER  QUADRANT COMPARISON:  Abdominal ultrasound September 04, 2016 FINDINGS: Gallbladder: No gallstones or wall thickening visualized.  There is no pericholecystic fluid. No sonographic Murphy sign noted by sonographer. Common bile duct: Diameter: 5 mm. No intrahepatic or extrahepatic biliary duct dilatation. Liver: No focal lesion identified. Liver echogenicity is increased diffusely. Portal vein is patent on color Doppler imaging with normal direction of blood flow towards the liver. IMPRESSION: Diffuse increase in liver echogenicity, a finding indicative of hepatic steatosis. While no focal liver lesions are evident on this study, it must be cautioned that the sensitivity of ultrasound for detection of focal liver lesions is diminished in this circumstance. Study otherwise unremarkable. Electronically Signed   By: Bretta Bang III M.D.   On: 07/12/2018 11:43    Procedures Procedures (including critical care time)  Medications Ordered in ED Medications - No data to display   Initial Impression / Assessment and Plan / ED Course  I have reviewed the triage vital signs and the nursing notes.  Pertinent labs & imaging results that were available during my care of the patient were reviewed by me and considered in my medical decision making (see chart for details).        BP (!) 142/86   Pulse 67   Temp 97.7 F (36.5 C) (Oral)   Resp 18   Wt 63.5 kg   SpO2 99%   BMI 24.80 kg/m    Final Clinical Impressions(s) / ED Diagnoses   Final diagnoses:  Upper abdominal pain  Transaminitis    ED Discharge Orders         Ordered    ondansetron (ZOFRAN) 4 MG tablet  Every 8 hours PRN     07/12/18 1237         10:41 AM Patient here with upper abdominal pain epigastric, right upper quadrant.  Pain is suggestive of biliary disease.  Will obtain limited abdominal ultrasound for further evaluation.  She also reports prior history of heart attack therefore will obtain EKG and troponin as well.  Pain medication offered but patient declined.  She has been resting comfortably in no acute discomfort.  12:34 PM Urine  without signs of urinary tract infection, pregnancy test negative, normal lipase, normal electrolytes panel with exception of transaminitis with AST 141, ALT 175.  This is higher than previous values.  She has normal WBC, normal H&H.  Her EKG and troponin is unremarkable.  Limited abdominal ultrasound performed showing diffuse increase in liver echogenicity indicative of hepatic steatosis.  At this time, I discussed the finding with patient.  Shortly urged patient to follow-up with her GI specialist for further evaluation.  She would benefit from liver biopsy if indicated.  Patient strongly encouraged to quit drinking alcohol as this can worsen her symptoms.  Return precaution discussed.  Care discussed with Dr. Clarene Duke.    Fayrene Helper, PA-C 07/12/18 1238    Little, Ambrose [place], MD 07/12/18 1414

## 2018-08-05 ENCOUNTER — Other Ambulatory Visit: Payer: Self-pay | Admitting: Cardiovascular Disease

## 2018-08-20 HISTORY — PX: CHOLECYSTECTOMY: SHX55

## 2018-10-03 ENCOUNTER — Other Ambulatory Visit: Payer: Self-pay | Admitting: Cardiovascular Disease

## 2019-07-22 NOTE — Progress Notes (Deleted)
Cardiology Office Note:    Date:  07/22/2019   ID:  Lynn Harvey, DOB 06/28/1965, MRN [id number]  PCP:  [name], MD  Cardiologist:  Nicki Guadalajara, MD   Referring MD: [name], MD   No chief complaint on file. ***  History of Present Illness:    Lynn Harvey is a 54 y.o. female with a hx of CAD s/p NSTEMI treated with cutting balloon angioplasty to midLAD in 2015 (no stent placed as there was excellent result with cutting balloon and stenting would have jailed a diagonal vessel), repeat heart cath in the same month showed patent site, HTN, HLD, bilateral carotid artery stenosis, iron deficiency anemia and lupus. She was last seen in clinic 08/2016 by Lynn Harvey [facility] with exertional chest pain. Hb at that time was 7.6 from 13.6. She also complained of lower extremity pain with walking.  ABIs did not show evidence of LE arterial blockage. CP was observed since it was not clear that her symptoms were not demand ischemia in the setting of anemia.   He presents today for follow up. Has not been seen since 2018.       CAD s/p cutting balloon angioplasty to mid LAD (2015) - continue ASA   Carotid artery stenosis - last duplex 2017 with bilateral 1-39% stenosis that was actually improved from prior study   Hypertension - losartan 50 mg, loperssor 25 mg BID   Hyperlipidemia LDL goal less than 70 - need updated lipid profile - last LDL 58 in 2015 - not on a statin    Lupus - places her at higher risk for ACS - plaquenil     Past Medical History:  Diagnosis Date  . Allergy   . Anxiety   . Coronary artery disease    a. s/p NSTEMI in 2015 with angioplasty alone to mid-LAD, repeat cath within the same month showing a patent site  . Depression   . Gestational diabetes   . Hypertension   . Insomnia   . Lupus (HCC)   . Myocardial infarction (HCC) 06/2013  . Rectal abnormality     Past Surgical History:  Procedure Laterality Date  . ABDOMINAL HYSTERECTOMY    .  COLON SURGERY     Flexible Sigmoidoscopy  . COLONOSCOPY N/A 01/08/2014   Procedure: COLONOSCOPY;  Surgeon: Theda Belfast, MD;  Location: [facility];  Service: Endoscopy;  Laterality: N/A;  . ESOPHAGOGASTRODUODENOSCOPY N/A 01/08/2014   Procedure: ESOPHAGOGASTRODUODENOSCOPY (EGD);  Surgeon: Theda Belfast, MD;  Location: [facility];  Service: Endoscopy;  Laterality: N/A;  . LEFT HEART CATHETERIZATION WITH CORONARY ANGIOGRAM N/A 06/24/2013   Procedure: LEFT HEART CATHETERIZATION WITH CORONARY ANGIOGRAM;  Surgeon: [name], MD;  Location: [facility] CATH LAB;  Service: Cardiovascular;  Laterality: N/A;  . LEFT HEART CATHETERIZATION WITH CORONARY ANGIOGRAM N/A 07/17/2013   Procedure: LEFT HEART CATHETERIZATION WITH CORONARY ANGIOGRAM;  Surgeon: Kathleene Hazel, MD;  Location: [facility] CATH LAB;  Service: Cardiovascular;  Laterality: N/A;  . TONSILECTOMY, ADENOIDECTOMY, BILATERAL MYRINGOTOMY AND TUBES    . WRIST SURGERY  1987   RIGHT; bone from forearm grafted to wrist    Current Medications: No outpatient medications have been marked as taking for the 07/31/19 encounter (Appointment) with Lynn Duster, PA.     Allergies:   Buprenorphine hcl, Morphine, Morphine and related, and Ace inhibitors   Social History   Socioeconomic History  . Marital status: Married    Spouse name: Lynn Harvey  . Number of  children: 3  . Years of education: 10  . Highest education level: Not on file  Occupational History  . Occupation: Research scientist (physical sciences): [employer]  Tobacco Use  . Smoking status: Former Smoker    Packs/day: 2.00    Years: 20.00    Pack years: 40.00    Types: Cigarettes    Start date: 12/25/1982  . Smokeless tobacco: Never Used  . Tobacco comment: quit 06/24/13  Substance and Sexual Activity  . Alcohol use: Yes    Alcohol/week: 24.0 standard drinks    Types: 24 Cans of beer per week    Comment: more since fall 2012  . Drug use: No  . Sexual activity: Yes     Partners: Male    Birth control/protection: Surgical  Other Topics Concern  . Not on file  Social History Narrative   Lives with her husband, their son Lynn Harvey, and her daughter Lynn Harvey and Lynn Harvey's son Lynn Harvey.   Social Determinants of Health   Financial Resource Strain:   . Difficulty of Paying Living Expenses:   Food Insecurity:   . Worried About Charity fundraiser in the Last Year:   . Arboriculturist in the Last Year:   Transportation Needs:   . Film/video editor (Medical):   Marland Kitchen Lack of Transportation (Non-Medical):   Physical Activity:   . Days of Exercise per Week:   . Minutes of Exercise per Session:   Stress:   . Feeling of Stress :   Social Connections:   . Frequency of Communication with Friends and Family:   . Frequency of Social Gatherings with Friends and Family:   . Attends Religious Services:   . Active Member of Clubs or Organizations:   . Attends Archivist Meetings:   Marland Kitchen Marital Status:      Family History: The patient's ***family history includes Barrett's esophagus in her father; COPD in her mother; Cancer in her maternal grandfather and mother; Depression in her father and sister; Diabetes in her maternal grandfather, maternal grandmother, paternal grandfather, and paternal grandmother; Heart disease in her maternal grandmother and paternal grandfather; Heart disease (age of onset: 60) in her father; Kidney disease in her paternal grandmother; Mental illness in her sister and son.  ROS:   Please see the history of present illness.    *** All other systems reviewed and are negative.  EKGs/Labs/Other Studies Reviewed:    The following studies were reviewed today: ***  EKG:  EKG is *** ordered today.  The ekg ordered today demonstrates ***  Recent Labs: No results found for requested labs within last 8760 hours.  Recent Lipid Panel    Component Value Date/Time   CHOL 119 07/29/2013 0847   TRIG 119 07/29/2013 0847   HDL 37 (L) 07/29/2013  0847   CHOLHDL 3.2 07/29/2013 0847   VLDL 24 07/29/2013 0847   LDLCALC 58 07/29/2013 0847    Physical Exam:    VS:  There were no vitals taken for this visit.    Wt Readings from Last 3 Encounters:  07/12/18 140 lb (63.5 kg)  08/18/17 138 lb (62.6 kg)  10/12/16 139 lb (63 kg)     GEN: *** Well nourished, well developed in no acute distress HEENT: Normal NECK: No JVD; No carotid bruits LYMPHATICS: No lymphadenopathy CARDIAC: ***RRR, no murmurs, rubs, gallops RESPIRATORY:  Clear to auscultation without rales, wheezing or rhonchi  ABDOMEN: Soft, non-tender, non-distended MUSCULOSKELETAL:  No edema; No deformity  SKIN: Warm and dry NEUROLOGIC:  Alert and oriented x 3 PSYCHIATRIC:  Normal affect   ASSESSMENT:    No diagnosis found. PLAN:    In order of problems listed above:  No diagnosis found.   Medication Adjustments/Labs and Tests Ordered: Current medicines are reviewed at length with the patient today.  Concerns regarding medicines are outlined above.  No orders of the defined types were placed in this encounter.  No orders of the defined types were placed in this encounter.   Signed, Lynn Duster, PA  07/22/2019 4:08 PM    [facility]

## 2019-07-31 ENCOUNTER — Ambulatory Visit: Payer: 59 | Admitting: Physician Assistant

## 2019-08-12 ENCOUNTER — Other Ambulatory Visit: Payer: Self-pay

## 2019-08-12 ENCOUNTER — Emergency Department (HOSPITAL_COMMUNITY)
Admission: EM | Admit: 2019-08-12 | Discharge: 2019-08-13 | Disposition: A | Discharge status: Home / Self Care | Payer: 59 | Attending: Emergency Medicine | Admitting: Emergency Medicine

## 2019-08-12 ENCOUNTER — Encounter (HOSPITAL_COMMUNITY): Payer: Self-pay

## 2019-08-12 DIAGNOSIS — Z87891 Personal history of nicotine dependence: Secondary | ICD-10-CM | POA: Diagnosis not present

## 2019-08-12 DIAGNOSIS — Z7982 Long term (current) use of aspirin: Secondary | ICD-10-CM | POA: Insufficient documentation

## 2019-08-12 DIAGNOSIS — I2510 Atherosclerotic heart disease of native coronary artery without angina pectoris: Secondary | ICD-10-CM | POA: Diagnosis not present

## 2019-08-12 DIAGNOSIS — Z79899 Other long term (current) drug therapy: Secondary | ICD-10-CM | POA: Insufficient documentation

## 2019-08-12 DIAGNOSIS — R112 Nausea with vomiting, unspecified: Secondary | ICD-10-CM | POA: Diagnosis not present

## 2019-08-12 DIAGNOSIS — I10 Essential (primary) hypertension: Secondary | ICD-10-CM | POA: Insufficient documentation

## 2019-08-12 DIAGNOSIS — R1110 Vomiting, unspecified: Secondary | ICD-10-CM | POA: Insufficient documentation

## 2019-08-12 DIAGNOSIS — F17210 Nicotine dependence, cigarettes, uncomplicated: Secondary | ICD-10-CM | POA: Insufficient documentation

## 2019-08-12 LAB — CBC WITH DIFFERENTIAL/PLATELET
Abs Immature Granulocytes: 0.03 10*3/uL (ref 0.00–0.07)
Basophils Absolute: 0.1 10*3/uL (ref 0.0–0.1)
Basophils Relative: 1 %
Eosinophils Absolute: 0.1 10*3/uL (ref 0.0–0.5)
Eosinophils Relative: 1 %
HCT: 43.5 % (ref 36.0–46.0)
Hemoglobin: 14.3 g/dL (ref 12.0–15.0)
Immature Granulocytes: 0 %
Lymphocytes Relative: 30 %
Lymphs Abs: 3.1 10*3/uL (ref 0.7–4.0)
MCH: 34.8 pg — ABNORMAL HIGH (ref 26.0–34.0)
MCHC: 32.9 g/dL (ref 30.0–36.0)
MCV: 105.8 fL — ABNORMAL HIGH (ref 80.0–100.0)
Monocytes Absolute: 0.8 10*3/uL (ref 0.1–1.0)
Monocytes Relative: 8 %
Neutro Abs: 6.2 10*3/uL (ref 1.7–7.7)
Neutrophils Relative %: 60 %
Platelets: 217 10*3/uL (ref 150–400)
RBC: 4.11 MIL/uL (ref 3.87–5.11)
RDW: 13.3 % (ref 11.5–15.5)
WBC: 10.2 10*3/uL (ref 4.0–10.5)
nRBC: 0 % (ref 0.0–0.2)

## 2019-08-12 LAB — BASIC METABOLIC PANEL
Anion gap: 18 — ABNORMAL HIGH (ref 5–15)
BUN: 7 mg/dL (ref 6–20)
CO2: 22 mmol/L (ref 22–32)
Calcium: 9.1 mg/dL (ref 8.9–10.3)
Chloride: 100 mmol/L (ref 98–111)
Creatinine, Ser: 0.92 mg/dL (ref 0.44–1.00)
GFR calc Af Amer: >60 mL/min (ref >60)
GFR calc non Af Amer: >60 mL/min (ref >60)
Glucose, Bld: 102 mg/dL — ABNORMAL HIGH (ref 70–99)
Potassium: 3.5 mmol/L (ref 3.5–5.1)
Sodium: 140 mmol/L (ref 135–145)

## 2019-08-12 NOTE — ED Triage Notes (Signed)
Patient reports generalized weakness , fatigue , nausea and tingling at head after receiving her 2nd Covid vaccination today , denies SOB , no fever or chills .

## 2019-08-12 NOTE — ED Triage Notes (Signed)
Pt received her 2nd COVID vaccine today and hours later she states that her mouth and lips became numb and then it radiated to her head, she also states that she's been vomiting since then and is having a hard time standing on her own

## 2019-08-12 NOTE — ED Notes (Signed)
husband [phone]

## 2019-08-13 ENCOUNTER — Emergency Department (HOSPITAL_COMMUNITY)
Admission: EM | Admit: 2019-08-13 | Discharge: 2019-08-13 | Disposition: A | Discharge status: Home / Self Care | Payer: 59 | Source: Home / Self Care | Attending: Emergency Medicine | Admitting: Emergency Medicine

## 2019-08-13 DIAGNOSIS — R202 Paresthesia of skin: Secondary | ICD-10-CM

## 2019-08-13 DIAGNOSIS — N390 Urinary tract infection, site not specified: Secondary | ICD-10-CM

## 2019-08-13 DIAGNOSIS — R112 Nausea with vomiting, unspecified: Secondary | ICD-10-CM

## 2019-08-13 LAB — MAGNESIUM: Magnesium: 1.6 mg/dL — ABNORMAL LOW (ref 1.7–2.4)

## 2019-08-13 MED ORDER — PROMETHAZINE HCL 25 MG/ML IJ SOLN
12.5000 mg | Freq: Once | INTRAMUSCULAR | Status: AC
  Administered 2019-08-13: 12.5 mg via INTRAVENOUS
  Filled 2019-08-13: qty 1

## 2019-08-13 MED ORDER — ONDANSETRON HCL 4 MG/2ML IJ SOLN
INTRAMUSCULAR | Status: AC
  Administered 2019-08-13: 4 mg via INTRAVENOUS
  Filled 2019-08-13: qty 2

## 2019-08-13 MED ORDER — CEPHALEXIN 500 MG PO CAPS: 500.0000 mg | ORAL_CAPSULE | Freq: Four times a day (QID) | ORAL | 0 refills | Status: DC

## 2019-08-13 MED ORDER — MAGNESIUM SULFATE 2 GM/50ML IV SOLN
2.0000 g | Freq: Once | INTRAVENOUS | Status: AC
  Administered 2019-08-13: 2 g via INTRAVENOUS
  Filled 2019-08-13: qty 50

## 2019-08-13 MED ORDER — ONDANSETRON HCL 4 MG/2ML IJ SOLN: 4.0000 mg | Freq: Once | INTRAMUSCULAR | Status: AC

## 2019-08-13 MED ORDER — SODIUM CHLORIDE 0.9 % IV SOLN
1.0000 g | Freq: Once | INTRAVENOUS | Status: AC
  Administered 2019-08-13: 1 g via INTRAVENOUS
  Filled 2019-08-13: qty 10

## 2019-08-13 MED ORDER — LACTATED RINGERS IV BOLUS
1000.0000 mL | Freq: Once | INTRAVENOUS | Status: AC
  Administered 2019-08-13: 1000 mL via INTRAVENOUS

## 2019-08-13 MED ORDER — PROMETHAZINE HCL 25 MG RE SUPP: 25.0000 mg | Freq: Four times a day (QID) | RECTAL | 0 refills | Status: DC | PRN

## 2019-08-13 MED ORDER — MAGNESIUM OXIDE 420 MG PO TABS: 420.0000 mg | ORAL_TABLET | Freq: Every day | ORAL | 0 refills | Status: DC

## 2019-08-13 MED ORDER — SODIUM CHLORIDE 0.9 % IV BOLUS
1000.0000 mL | Freq: Once | INTRAVENOUS | Status: AC
  Administered 2019-08-13: 1000 mL via INTRAVENOUS

## 2019-08-13 MED ORDER — PROMETHAZINE HCL 25 MG PO TABS: 25.0000 mg | ORAL_TABLET | Freq: Four times a day (QID) | ORAL | 0 refills | Status: DC | PRN

## 2019-08-13 NOTE — ED Notes (Signed)
Discharge paperwork reviewed with pt, pt offered wheelchair to ED entrance, to which pt declined.  Pt ambulatory with steady gait to exit.  No needs or concerns expressed at time of discharge.

## 2019-08-13 NOTE — ED Notes (Signed)
Received call from [place], needs pt discharged due to presenting there for treatment.

## 2019-08-13 NOTE — ED Provider Notes (Signed)
[facility]-EMERGENCY DEPT Provider Note   CSN: [id number] Arrival date & time: 08/12/19  2206     History No chief complaint on file.   Lynn Harvey is a 54 y.o. female.  After covid vaccine.  Also showed me her phone with evidence of UTI on labs done 2 days ago.    Emesis Severity:  Mild Timing:  Constant Quality:  Stomach contents Progression:  Unchanged Chronicity:  New Recent urination:  Normal Relieved by:  Nothing Worsened by:  Nothing      Past Medical History:  Diagnosis Date  . Allergy   . Anxiety   . Coronary artery disease    a. s/p NSTEMI in 2015 with angioplasty alone to mid-LAD, repeat cath within the same month showing a patent site  . Depression   . Gestational diabetes   . Hypertension   . Insomnia   . Lupus (HCC)   . Myocardial infarction (HCC) 06/2013  . Rectal abnormality     Patient Active Problem List   Diagnosis Date Noted  . Complex ovarian cyst 10/12/2016  . Presence of granulation tissue 10/12/2016  . Hemorrhagic cyst of ovary 10/12/2016  . Edema 08/24/2016  . Drug therapy 12/20/2015  . Sjogren's syndrome (HCC) 12/20/2015  . Elevated liver function tests 12/19/2015  . History of myocardial infarction 12/06/2015  . ANA positive 08/18/2015  . Mouth dryness 08/18/2015  . GAD (generalized anxiety disorder) 08/17/2015  . Recurrent major depressive disorder (HCC) 08/17/2015  . Left foot pain 07/04/2015  . Telangiectasia 07/04/2015  . Elevated glucose 05/04/2015  . Milia 05/04/2015  . Vitamin D deficiency 05/04/2015  . Anemia 04/27/2015  . Fatigue 04/27/2015  . Health care maintenance 04/27/2015  . History of MI (myocardial infarction) 04/27/2015  . Hot flashes 04/27/2015  . Low back pain radiating to both legs 03/10/2014  . Bilateral carotid artery disease (HCC) 02/17/2014  . First degree hemorrhoids 01/15/2014  . Gastroesophageal reflux disease without esophagitis 01/15/2014  . Iron deficiency anemia  01/13/2014  . RBC microcytosis 01/13/2014  . CAD S/P LAD DES May 2015 12/17/2013  . HTN (hypertension) 06/25/2013  . High cholesterol 06/25/2013  . Chest pain with moderate risk of acute coronary syndrome 06/24/2013  . NSTEMI (non-ST elevated myocardial infarction) (HCC) 06/24/2013  . Myocardial infarction (HCC) 06/19/2013  . Tobacco abuse 06/10/2012  . Allergic rhinitis 06/10/2012  . ADD (attention deficit disorder) 03/06/2012  . Depressive disorder 04/12/2011    Past Surgical History:  Procedure Laterality Date  . ABDOMINAL HYSTERECTOMY    . COLON SURGERY     Flexible Sigmoidoscopy  . COLONOSCOPY N/A 01/08/2014   Procedure: COLONOSCOPY;  Surgeon: Theda Belfast, MD;  Location: [facility];  Service: Endoscopy;  Laterality: N/A;  . ESOPHAGOGASTRODUODENOSCOPY N/A 01/08/2014   Procedure: ESOPHAGOGASTRODUODENOSCOPY (EGD);  Surgeon: Theda Belfast, MD;  Location: [facility];  Service: Endoscopy;  Laterality: N/A;  . LEFT HEART CATHETERIZATION WITH CORONARY ANGIOGRAM N/A 06/24/2013   Procedure: LEFT HEART CATHETERIZATION WITH CORONARY ANGIOGRAM;  Surgeon: [name], MD;  Location: [facility] CATH LAB;  Service: Cardiovascular;  Laterality: N/A;  . LEFT HEART CATHETERIZATION WITH CORONARY ANGIOGRAM N/A 07/17/2013   Procedure: LEFT HEART CATHETERIZATION WITH CORONARY ANGIOGRAM;  Surgeon: Kathleene Hazel, MD;  Location: [facility] CATH LAB;  Service: Cardiovascular;  Laterality: N/A;  . TONSILECTOMY, ADENOIDECTOMY, BILATERAL MYRINGOTOMY AND TUBES    . WRIST SURGERY  1987   RIGHT; bone from forearm grafted to wrist     OB History  Gravida  5   Para  3   Term  2   Preterm  1   AB  2   Living  3     SAB  1   TAB  0   Ectopic  1   Multiple  0   Live Births              Family History  Problem Relation Age of Onset  . Cancer Mother   . COPD Mother        emphysema  . Heart disease Father 41       s/p 3V CABG  . Depression Father   . Barrett's esophagus Father     . Mental illness Sister        depression  . Depression Sister   . Mental illness Son        ADHD, bipolar disorder  . Diabetes Maternal Grandmother   . Heart disease Maternal Grandmother   . Diabetes Maternal Grandfather   . Cancer Maternal Grandfather        lung cancer  . Diabetes Paternal Grandmother   . Kidney disease Paternal Grandmother   . Diabetes Paternal Grandfather   . Heart disease Paternal Grandfather     Social History   Tobacco Use  . Smoking status: Former Smoker    Packs/day: 2.00    Years: 20.00    Pack years: 40.00    Types: Cigarettes    Start date: 12/25/1982  . Smokeless tobacco: Never Used  . Tobacco comment: quit 06/24/13  Substance Use Topics  . Alcohol use: Yes    Alcohol/week: 24.0 standard drinks    Types: 24 Cans of beer per week    Comment: more since fall 2012  . Drug use: No    Home Medications Prior to Admission medications   Medication Sig Start Date End Date Taking? Authorizing Provider  amphetamine-dextroamphetamine (ADDERALL) 15 MG tablet Take 0.5 tablets by mouth 2 (two) times daily. Patient taking differently: Take 15 mg by mouth 2 (two) times daily.  05/17/14   Porfirio Oar, PA  aspirin EC 81 MG tablet Take 81 mg by mouth daily.    [provider]  buPROPion (WELLBUTRIN XL) 300 MG 24 hr tablet Take 300 mg by mouth daily.  10/05/16   [provider]  hydroxychloroquine (PLAQUENIL) 200 MG tablet Take 200 mg by mouth daily.  08/18/15   [provider]  losartan (COZAAR) 50 MG tablet TAKE 1 TABLET BY MOUTH DAILY. NEED APPOINTMENT FOR FUTURE REFILLS Patient not taking: Reported on 07/12/2018 05/12/18   [name], MD  metoprolol tartrate (LOPRESSOR) 25 MG tablet Take 1 tablet (25 mg total) by mouth 2 (two) times daily. OFFICE VISIT NEEDED 08/05/18   [name], MD  nitroGLYCERIN (NITROSTAT) 0.4 MG SL tablet Place 1 tablet (0.4 mg total) under the tongue every 5 (five) minutes as needed for chest pain.  08/31/16   [name], MD  omeprazole (PRILOSEC) 20 MG capsule Take 20 mg by mouth 2 (two) times daily before a meal.    [provider]  ondansetron (ZOFRAN) 4 MG tablet Take 1 tablet (4 mg total) by mouth every 8 (eight) hours as needed for nausea or vomiting. 07/12/18   Fayrene Helper, PA-C  pilocarpine (SALAGEN) 5 MG tablet Take 5 mg by mouth 3 (three) times daily.  08/18/15   [provider]  zolpidem (AMBIEN) 5 MG tablet Take 5 mg by mouth at bedtime.  12/07/14   [provider]    Allergies    Buprenorphine hcl, Morphine, Morphine and related, and Ace inhibitors  Review of Systems   Review of Systems  Gastrointestinal: Positive for vomiting.  All other systems reviewed and are negative.   Physical Exam Updated Vital Signs BP 135/79   Pulse 77   Temp 97.8 F (36.6 C) (Oral)   Resp 15   SpO2 99%   Physical Exam Vitals and nursing note reviewed.  Constitutional:      Appearance: She is well-developed.  HENT:     Head: Normocephalic and atraumatic.     Mouth/Throat:     Mouth: Mucous membranes are dry.     Pharynx: Oropharynx is clear.  Eyes:     Conjunctiva/sclera: Conjunctivae normal.  Cardiovascular:     Rate and Rhythm: Normal rate and regular rhythm.  Pulmonary:     Effort: No respiratory distress.     Breath sounds: No stridor.  Abdominal:     General: There is no distension.  Musculoskeletal:        General: No swelling or tenderness. Normal range of motion.     Cervical back: Normal range of motion.  Skin:    General: Skin is warm and dry.  Neurological:     General: No focal deficit present.     Mental Status: She is alert.     Cranial Nerves: No cranial nerve deficit.     Sensory: No sensory deficit.     ED Results / Procedures / Treatments   Labs (all labs ordered are listed, but only abnormal results are displayed) Labs Reviewed  MAGNESIUM - Abnormal; Notable for the following components:      Result Value    Magnesium 1.6 (*)    All other components within normal limits    EKG None  Radiology No results found.  Procedures Procedures (including critical care time)  Medications Ordered in ED Medications  magnesium sulfate IVPB 2 g 50 mL (has no administration in time range)  sodium chloride 0.9 % bolus 1,000 mL (0 mLs Intravenous Stopped 08/13/19 0529)  cefTRIAXone (ROCEPHIN) 1 g in sodium chloride 0.9 % 100 mL IVPB (0 g Intravenous Stopped 08/13/19 0529)  promethazine (PHENERGAN) injection 12.5 mg (12.5 mg Intravenous Given 08/13/19 0210)  lactated ringers bolus 1,000 mL (1,000 mLs Intravenous New Bag/Given 08/13/19 0556)  ondansetron (ZOFRAN) injection 4 mg (4 mg Intravenous Given 08/13/19 0556)    ED Course  I have reviewed the triage vital signs and the nursing notes.  Pertinent labs & imaging results that were available during my care of the patient were reviewed by me and considered in my medical decision making (see chart for details).    MDM Rules/Calculators/A&P                          Hypomagnesemia and nbnb emesis. repleted fluids/Mg. No hypo-k. Working on getting vomiting under control and likely discharge.   Final Clinical Impression(s) / ED Diagnoses Final diagnoses:  None    Rx / DC Orders ED Discharge Orders    None       Gadge Hermiz, Corene Cornea, MD 08/13/19 [phone]

## 2019-08-13 NOTE — ED Notes (Signed)
ED Provider at bedside. 

## 2019-08-17 ENCOUNTER — Encounter (HOSPITAL_COMMUNITY): Payer: Self-pay

## 2019-08-17 ENCOUNTER — Other Ambulatory Visit: Payer: Self-pay

## 2019-08-17 ENCOUNTER — Emergency Department (HOSPITAL_COMMUNITY)
Admission: EM | Admit: 2019-08-17 | Discharge: 2019-08-17 | Disposition: A | Discharge status: Home / Self Care | Payer: 59 | Attending: Emergency Medicine | Admitting: Emergency Medicine

## 2019-08-17 DIAGNOSIS — Z5321 Procedure and treatment not carried out due to patient leaving prior to being seen by health care provider: Secondary | ICD-10-CM | POA: Diagnosis not present

## 2019-08-17 DIAGNOSIS — R1110 Vomiting, unspecified: Secondary | ICD-10-CM | POA: Diagnosis not present

## 2019-08-17 DIAGNOSIS — R202 Paresthesia of skin: Secondary | ICD-10-CM | POA: Diagnosis present

## 2019-08-17 NOTE — ED Notes (Addendum)
Pt stated she wanted to go home. She said she had an appt with PCP tomorrow at noon. Pt denies pain. VS stable. Ambulating in room and around triage.

## 2019-08-17 NOTE — ED Triage Notes (Signed)
Patient states that she has had numbness "all over." since having her 2nd covid shot. Patient states the numbness went away 4 days ago. Today, the patient states she vomited x 3 and then the numbness came back to the point where she has trouble standing or walking. Patient states numbness of her arms, but moving arms up and down in triage.

## 2019-08-18 ENCOUNTER — Ambulatory Visit (INDEPENDENT_AMBULATORY_CARE_PROVIDER_SITE_OTHER): Payer: 59 | Admitting: Cardiovascular Disease

## 2019-08-18 ENCOUNTER — Encounter: Payer: Self-pay | Admitting: Cardiovascular Disease

## 2019-08-18 VITALS — BP 130/80 | HR 78 | Ht 63.0 in | Wt 121.0 lb

## 2019-08-18 DIAGNOSIS — I781 Nevus, non-neoplastic: Secondary | ICD-10-CM

## 2019-08-18 DIAGNOSIS — I10 Essential (primary) hypertension: Secondary | ICD-10-CM

## 2019-08-18 DIAGNOSIS — I214 Non-ST elevation (NSTEMI) myocardial infarction: Secondary | ICD-10-CM

## 2019-08-18 DIAGNOSIS — Z9861 Coronary angioplasty status: Secondary | ICD-10-CM

## 2019-08-18 DIAGNOSIS — I779 Disorder of arteries and arterioles, unspecified: Secondary | ICD-10-CM

## 2019-08-18 DIAGNOSIS — I2510 Atherosclerotic heart disease of native coronary artery without angina pectoris: Secondary | ICD-10-CM

## 2019-08-18 DIAGNOSIS — K760 Fatty (change of) liver, not elsewhere classified: Secondary | ICD-10-CM

## 2019-08-18 DIAGNOSIS — R7989 Other specified abnormal findings of blood chemistry: Secondary | ICD-10-CM

## 2019-08-18 DIAGNOSIS — E785 Hyperlipidemia, unspecified: Secondary | ICD-10-CM

## 2019-08-18 MED ORDER — EZETIMIBE 10 MG PO TABS: 10.0000 mg | ORAL_TABLET | Freq: Every day | ORAL | 3 refills | Status: DC

## 2019-08-18 MED ORDER — LOSARTAN POTASSIUM 50 MG PO TABS: 75.0000 mg | ORAL_TABLET | Freq: Every day | ORAL | 3 refills | Status: DC
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
## 2019-08-18 NOTE — Patient Instructions (Addendum)
Medication Instructions:  INCREASE YOUR LOSARTAN TO 75MG  DAILY HOLD YOUR ATORVASTATIN UNTIL AFTER YOUR LFTs ARE RECHECKED BEGIN TAKING ZETIA 10MG  DAILY  *If you need a refill on your cardiac medications before your next appointment, please call your pharmacy*   Lab Work: FOLLOW UP LABS (LFTs ) WITH YOUR PCP  If you have labs (blood work) drawn today and your tests are completely normal, you will receive your results only by: MyChart Message (if you have MyChart) OR . A paper copy in the mail If you have any lab test that is abnormal or we need to change your treatment, we will call you to review the results.   Testing/Procedures: Your physician has requested that you have an echocardiogram. Echocardiography is a painless test that uses sound waves to create images of your heart. It provides your doctor with information about the size and shape of your heart and how well your heart's chambers and valves are working. This procedure takes approximately one hour. There are no restrictions for this procedure.  [address]   Follow-Up: At [facility], you and your health needs are our priority.  As part of our continuing mission to provide you with exceptional heart care, we have created designated Provider Care Teams.  These Care Teams include your primary Cardiologist (physician) and Advanced Practice Providers (APPs -  Physician Assistants and Nurse Practitioners) who all work together to provide you with the care you need, when you need it.  We recommend signing up for the patient portal called "MyChart".  Sign up information is provided on this After Visit Summary.  MyChart is used to connect with patients for Virtual Visits (Telemedicine).  Patients are able to view lab/test results, encounter notes, upcoming appointments, etc.  Non-urgent messages can be sent to your provider as well.   To learn more about what you can do with MyChart, go to Marland Kitchen.     Your next appointment:   6 month(s)  The format for your next appointment:   In Person  Provider:   [facility], MD   Other Instructions CONSIDER REFERRAL TO DERMATOLOGY

## 2019-08-19 ENCOUNTER — Encounter: Payer: Self-pay | Admitting: Cardiovascular Disease

## 2019-09-02 ENCOUNTER — Encounter: Payer: Self-pay | Admitting: *Deleted

## 2019-09-03 ENCOUNTER — Other Ambulatory Visit: Payer: Self-pay

## 2019-09-03 ENCOUNTER — Ambulatory Visit (INDEPENDENT_AMBULATORY_CARE_PROVIDER_SITE_OTHER): Payer: 59 | Admitting: Neurology

## 2019-09-03 ENCOUNTER — Encounter: Payer: Self-pay | Admitting: Neurology

## 2019-09-03 VITALS — BP 131/79 | HR 97 | Ht 63.0 in | Wt 118.0 lb

## 2019-09-03 DIAGNOSIS — G3281 Cerebellar ataxia in diseases classified elsewhere: Secondary | ICD-10-CM

## 2019-09-03 DIAGNOSIS — R202 Paresthesia of skin: Secondary | ICD-10-CM

## 2019-09-03 DIAGNOSIS — R269 Unspecified abnormalities of gait and mobility: Secondary | ICD-10-CM | POA: Diagnosis not present

## 2019-09-03 DIAGNOSIS — R270 Ataxia, unspecified: Secondary | ICD-10-CM | POA: Diagnosis not present

## 2019-09-03 DIAGNOSIS — R200 Anesthesia of skin: Secondary | ICD-10-CM

## 2019-09-03 DIAGNOSIS — W19XXXA Unspecified fall, initial encounter: Secondary | ICD-10-CM

## 2019-09-03 MED ORDER — METHYLPREDNISOLONE 4 MG PO TBPK: ORAL_TABLET | ORAL | 1 refills | Status: DC

## 2019-09-03 MED ORDER — PREGABALIN 50 MG PO CAPS: 50.0000 mg | ORAL_CAPSULE | Freq: Three times a day (TID) | ORAL | 2 refills | Status: DC

## 2019-09-03 NOTE — Patient Instructions (Addendum)
MRI brain and cervical spine Blood Work EMG/NCS Lumbar Puncture Start Lyrica   Electromyoneurogram Electromyoneurogram is a test to check how well your muscles and nerves are working. This procedure includes the combined use of electromyogram (EMG) and nerve conduction study (NCS). EMG is used to look for muscular disorders. NCS, which is also called electroneurogram, measures how well your nerves are controlling your muscles. The procedures are usually done together to check if your muscles and nerves are healthy. If the results of the tests are abnormal, this may indicate disease or injury, such as a neuromuscular disease or peripheral nerve damage. Tell a health care provider about:  Any allergies you have.  All medicines you are taking, including vitamins, herbs, eye drops, creams, and over-the-counter medicines.  Any problems you or family members have had with anesthetic medicines.  Any blood disorders you have.  Any surgeries you have had.  Any medical conditions you have.  If you have a pacemaker.  Whether you are pregnant or may be pregnant. What are the risks? Generally, this is a safe procedure. However, problems may occur, including:  Infection where the electrodes were inserted.  Bleeding. What happens before the procedure? Medicines Ask your health care provider about:  Changing or stopping your regular medicines. This is especially important if you are taking diabetes medicines or blood thinners.  Taking medicines such as aspirin and ibuprofen. These medicines can thin your blood. Do not take these medicines unless your health care provider tells you to take them.  Taking over-the-counter medicines, vitamins, herbs, and supplements. General instructions  Your health care provider may ask you to avoid: ? Beverages that have caffeine, such as coffee and tea. ? Any products that contain nicotine or tobacco. These products include cigarettes, e-cigarettes, and  chewing tobacco. If you need help quitting, ask your health care provider.  Do not use lotions or creams on the same day that you will be having the procedure. What happens during the procedure? For EMG   Your health care provider will ask you to stay in a position so that he or she can access the muscle that will be studied. You may be standing, sitting, or lying down.  You may be given a medicine that numbs the area (local anesthetic).  A very thin needle that has an electrode will be inserted into your muscle.  Another small electrode will be placed on your skin near the muscle.  Your health care provider will ask you to continue to remain still.  The electrodes will send a signal that tells about the electrical activity of your muscles. You may see this on a monitor or hear it in the room.  After your muscles have been studied at rest, your health care provider will ask you to contract or flex your muscles. The electrodes will send a signal that tells about the electrical activity of your muscles.  Your health care provider will remove the electrodes and the electrode needles when the procedure is finished. The procedure may vary among health care providers and hospitals. For NCS   An electrode that records your nerve activity (recording electrode) will be placed on your skin by the muscle that is being studied.  An electrode that is used as a reference (reference electrode) will be placed near the recording electrode.  A paste or gel will be applied to your skin between the recording electrode and the reference electrode.  Your nerve will be stimulated with a mild shock. Your health  care provider will measure how much time it takes for your muscle to react.  Your health care provider will remove the electrodes and the gel when the procedure is finished. The procedure may vary among health care providers and hospitals. What happens after the procedure?  It is up to you to  get the results of your procedure. Ask your health care provider, or the department that is doing the procedure, when your results will be ready.  Your health care provider may: ? Give you medicines for any pain. ? Monitor the insertion sites to make sure that bleeding stops. Summary  Electromyoneurogram is a test to check how well your muscles and nerves are working.  If the results of the tests are abnormal, this may indicate disease or injury.  This is a safe procedure. However, problems may occur, such as bleeding and infection.  Your health care provider will do two tests to complete this procedure. One checks your muscles (EMG) and another checks your nerves (NCS).  It is up to you to get the results of your procedure. Ask your health care provider, or the department that is doing the procedure, when your results will be ready. This information is not intended to replace advice given to you by your health care provider. Make sure you discuss any questions you have with your health care provider. Document Revised: 10/22/2017 Document Reviewed: 10/04/2017 Elsevier Patient Education  2020 [employer] Pregabalin capsules What is this medicine? PREGABALIN (pre GAB a lin) is used to treat nerve pain from diabetes, shingles, spinal cord injury, and fibromyalgia. It is also used to control seizures in epilepsy. This medicine may be used for other purposes; ask your health care provider or pharmacist if you have questions. COMMON BRAND NAME(S): Lyrica What should I tell my health care provider before I take this medicine? They need to know if you have any of these conditions:  heart disease  history of drug abuse or alcohol abuse problem  kidney disease  lung or breathing disease  suicidal thoughts, plans, or attempt; a previous suicide attempt by you or a family member  an unusual or allergic reaction to pregabalin, gabapentin, other medicines, foods, dyes, or  preservatives  pregnant or trying to get pregnant  breast-feeding How should I use this medicine? Take this medicine by mouth with a glass of water. Follow the directions on the prescription label. You can take it with or without food. If it upsets your stomach, take it with food. Take your medicine at regular intervals. Do not take it more often than directed. Do not stop taking except on your doctor's advice. A special MedGuide will be given to you by the pharmacist with each prescription and refill. Be sure to read this information carefully each time. Talk to your pediatrician regarding the use of this medicine in children. While this drug may be prescribed for children as young as 1 month for selected conditions, precautions do apply. Overdosage: If you think you have taken too much of this medicine contact a poison control center or emergency room at once. NOTE: This medicine is only for you. Do not share this medicine with others. What if I miss a dose? If you miss a dose, take it as soon as you can. If it is almost time for your next dose, take only that dose. Do not take double or extra doses. What may interact with this medicine? This medicine may interact with the following medications:  alcohol  antihistamines  for allergy, cough, and cold  certain medicines for anxiety or sleep  certain medicines for depression like amitriptyline, fluoxetine, sertraline  certain medicines for diabetes  certain medicines for seizures like phenobarbital, primidone  general anesthetics like halothane, isoflurane, methoxyflurane, propofol  local anesthetics like lidocaine, pramoxine, tetracaine  medicines that relax muscles for surgery  narcotic medicines for pain  phenothiazines like chlorpromazine, mesoridazine, prochlorperazine, thioridazine This list may not describe all possible interactions. Give your health care provider a list of all the medicines, herbs, non-prescription drugs,  or dietary supplements you use. Also tell them if you smoke, drink alcohol, or use illegal drugs. Some items may interact with your medicine. What should I watch for while using this medicine? Tell your doctor or healthcare professional if your symptoms do not start to get better or if they get worse. Visit your doctor or health care professional for regular checks on your progress. Do not stop taking except on your doctor's advice. You may develop a severe reaction. Your doctor will tell you how much medicine to take. Wear a medical identification bracelet or chain if you are taking this medicine for seizures, and carry a card that describes your disease and details of your medicine and dosage times. You may get drowsy or dizzy. Do not drive, use machinery, or do anything that needs mental alertness until you know how this medicine affects you. Do not stand or sit up quickly, especially if you are an older patient. This reduces the risk of dizzy or fainting spells. Alcohol may interfere with the effect of this medicine. Avoid alcoholic drinks. If you have a heart condition, like congestive heart failure, and notice that you are retaining water and have swelling in your hands or feet, contact your health care provider immediately. The use of this medicine may increase the chance of suicidal thoughts or actions. Pay special attention to how you are responding while on this medicine. Any worsening of mood, or thoughts of suicide or dying should be reported to your health care professional right away. This medicine has caused reduced sperm counts in some men. This may interfere with the ability to father a child. You should talk to your doctor or health care professional if you are concerned about your fertility. Women who become pregnant while using this medicine for seizures may enroll in the [place] [employer] Antiepileptic Drug Pregnancy Registry by calling [phone]. This registry collects information  about the safety of antiepileptic drug use during pregnancy. What side effects may I notice from receiving this medicine? Side effects that you should report to your doctor or health care professional as soon as possible:  allergic reactions like skin rash, itching or hives, swelling of the face, lips, or tongue  breathing problems  changes in vision  chest pain  confusion  jerking or unusual movements of any part of your body  loss of memory  muscle pain, tenderness, or weakness  suicidal thoughts or other mood changes  swelling of the ankles, feet, hands  unusual bruising or bleeding Side effects that usually do not require medical attention (report to your doctor or health care professional if they continue or are bothersome):  dizziness  drowsiness  dry mouth  headache  nausea  tremors  trouble sleeping  weight gain This list may not describe all possible side effects. Call your doctor for medical advice about side effects. You may report side effects to FDA at 1-800-FDA-1088. Where should I keep my medicine? Keep out of the reach  of children. This medicine can be abused. Keep your medicine in a safe place to protect it from theft. Do not share this medicine with anyone. Selling or giving away this medicine is dangerous and against the law. This medicine may cause accidental overdose and death if it taken by other adults, children, or pets. Mix any unused medicine with a substance like cat litter or coffee grounds. Then throw the medicine away in a sealed container like a sealed bag or a coffee can with a lid. Do not use the medicine after the expiration date. Store at room temperature between 15 and 30 degrees C (59 and 86 degrees F). NOTE: This sheet is a summary. It may not cover all possible information. If you have questions about this medicine, talk to your doctor, pharmacist, or health care provider.  2020 Elsevier/Gold Standard (2018-02-07  13:15:55)  Methylprednisolone tablets What is this medicine? METHYLPREDNISOLONE (meth ill pred NISS oh lone) is a corticosteroid. It is commonly used to treat inflammation of the skin, joints, lungs, and other organs. Common conditions treated include asthma, allergies, and arthritis. It is also used for other conditions, such as blood disorders and diseases of the adrenal glands. This medicine may be used for other purposes; ask your health care provider or pharmacist if you have questions. COMMON BRAND NAME(S): Medrol, Medrol Dosepak What should I tell my health care provider before I take this medicine? They need to know if you have any of these conditions:  Cushing's syndrome  eye disease, vision problems  diabetes  glaucoma  heart disease  high blood pressure  infection (especially a virus infection such as chickenpox, cold sores, or herpes)  liver disease  mental illness  myasthenia gravis  osteoporosis  recently received or scheduled to receive a vaccine  seizures  stomach or intestine problems  thyroid disease  an unusual or allergic reaction to lactose, methylprednisolone, other medicines, foods, dyes, or preservatives  pregnant or trying to get pregnant  breast-feeding How should I use this medicine? Take this medicine by mouth with a glass of water. Follow the directions on the prescription label. Take this medicine with food. If you are taking this medicine once a day, take it in the morning. Do not take it more often than directed. Do not suddenly stop taking your medicine because you may develop a severe reaction. Your doctor will tell you how much medicine to take. If your doctor wants you to stop the medicine, the dose may be slowly lowered over time to avoid any side effects. Talk to your pediatrician regarding the use of this medicine in children. Special care may be needed. Overdosage: If you think you have taken too much of this medicine contact a  poison control center or emergency room at once. NOTE: This medicine is only for you. Do not share this medicine with others. What if I miss a dose? If you miss a dose, take it as soon as you can. If it is almost time for your next dose, talk to your doctor or health care professional. You may need to miss a dose or take an extra dose. Do not take double or extra doses without advice. What may interact with this medicine? Do not take this medicine with any of the following medications:  alefacept  echinacea  live virus vaccines  metyrapone  mifepristone This medicine may also interact with the following medications:  amphotericin B  aspirin and aspirin-like medicines  certain antibiotics like erythromycin, clarithromycin, troleandomycin  certain medicines for diabetes  certain medicines for fungal infections like ketoconazole  certain medicines for seizures like carbamazepine, phenobarbital, phenytoin  certain medicines that treat or prevent blood clots like warfarin  cholestyramine  cyclosporine  digoxin  diuretics  female hormones, like estrogens and birth control pills  isoniazid  NSAIDs, medicines for pain inflammation, like ibuprofen or naproxen  other medicines for myasthenia gravis  rifampin  vaccines This list may not describe all possible interactions. Give your health care provider a list of all the medicines, herbs, non-prescription drugs, or dietary supplements you use. Also tell them if you smoke, drink alcohol, or use illegal drugs. Some items may interact with your medicine. What should I watch for while using this medicine? Tell your doctor or healthcare professional if your symptoms do not start to get better or if they get worse. Do not stop taking except on your doctor's advice. You may develop a severe reaction. Your doctor will tell you how much medicine to take. This medicine may increase your risk of getting an infection. Tell your doctor  or health care professional if you are around anyone with measles or chickenpox, or if you develop sores or blisters that do not heal properly. This medicine may increase blood sugar levels. Ask your healthcare provider if changes in diet or medicines are needed if you have diabetes. Tell your doctor or health care professional right away if you have any change in your eyesight. Using this medicine for a long time may increase your risk of low bone mass. Talk to your doctor about bone health. What side effects may I notice from receiving this medicine? Side effects that you should report to your doctor or health care professional as soon as possible:  allergic reactions like skin rash, itching or hives, swelling of the face, lips, or tongue  bloody or tarry stools  hallucination, loss of contact with reality  muscle cramps  muscle pain  palpitations  signs and symptoms of high blood sugar such as being more thirsty or hungry or having to urinate more than normal. You may also feel very tired or have blurry vision.  signs and symptoms of infection like fever or chills; cough; sore throat; pain or trouble passing urine Side effects that usually do not require medical attention (report to your doctor or health care professional if they continue or are bothersome):  changes in emotions or mood  constipation  diarrhea  excessive hair growth on the face or body  headache  nausea, vomiting  trouble sleeping  weight gain This list may not describe all possible side effects. Call your doctor for medical advice about side effects. You may report side effects to FDA at 1-800-FDA-1088. Where should I keep my medicine? Keep out of the reach of children. Store at room temperature between 20 and 25 degrees C (68 and 77 degrees F). Throw away any unused medicine after the expiration date. NOTE: This sheet is a summary. It may not cover all possible information. If you have questions about  this medicine, talk to your doctor, pharmacist, or health care provider.  2020 Elsevier/Gold Standard (2017-11-07 09:19:36)

## 2019-09-03 NOTE — Progress Notes (Signed)
[facility]    Provider:  Dr Jaynee Eagles Requesting Provider: Karleen Hampshire., MD Primary Care Provider:  Karleen Hampshire., MD  CC: "Numbness, tingling, pain everywhere, feet hands at the worst, sharp lightning pains, all started right after 2nd Covid shot "  HPI:  Lynn Harvey is a 54 y.o. female here as requested by Karleen Hampshire., MD for gait abnormality.  Past medical history hot flashes, hypertension, bilateral carotid artery disease, mouth dryness, sicca syndrome, GERD, fatty liver, intractable vomiting with nausea, hyperlipidemia, elevated glucose, recurrent major depression, generalized anxiety disorder, edema, insomnia, Sjogren's, prediabetes.  She has an allergy to buprenorphine, morphine, ACE inhibitors and doxycycline.  I reviewed Dr. Dian Situ notes that were provided which had no history absolutely of any symptoms in them.  She is a current every day smoker.  Here with her husband who also provides much information. June 23rd she had her second covid shot and around 6-7pm that night her face became numb like sh ehad a shot of novacaine, the back of her head, then all over her body. She got up and she had no "maneuverability", started in the head and immediately went all the way to the feet. She went to the emergency roo, she was vomiting. They kept jer overnight, the next morning could walk and they let her go home and she wasn't throwing up anymore, she still had symptoms afterwards but 5 days later had an acute episode again. She was in triage for 6 hours and by then she could walk again. The symptoms have not resolved, continuously worse, hands and deet are on fire, she can;t open jars. Her legs hurt at bedtime, she has lightning and shppting pain in the legs and the hands. The whole body is affected, husband says she will wake up crying.Marland Kitchen Her bowel movements are bad. She is also drinking daily, she is due for an abdominal ultrasound this month, she has a long history of  alcohol use a 1/2 gallon every 2 days, but not as much lately she drinks beer and white claw. The pain is getting worse, She was given gabapentin for the nerve pain but it knocks her out.   Reviewed notes, labs and imaging from outside physicians, which showed:  MRI cervical 12/2013-spine personally reviewed images and agree:  IMPRESSION: 1. Rightward disc herniations at C5-C6 and C6-C7, more pronounced at the former. Mild C5-C6 spinal stenosis and moderate right C6 foraminal stenosis. Mild right C7 foraminal stenosis. 2. Moderate to severe left facet degeneration at C3-C4. Associated moderate left C4 foraminal stenosis.   Review of Systems: Patient complains of symptoms per HPI as well as the following symptoms: weakness, numbness, tingling, imbalance. Pertinent negatives and positives per HPI. All others negative.   Social History   Socioeconomic History  . Marital status: Married    Spouse name: Barbaraann Rondo  . Number of children: 3  . Years of education: 73  . Highest education level: Not on file  Occupational History  . Occupation: Research scientist (physical sciences): [employer]  Tobacco Use  . Smoking status: Current Every Day Smoker    Packs/day: 1.50    Years: 20.00    Pack years: 30.00    Types: Cigarettes    Start date: 12/25/1982  . Smokeless tobacco: Never Used  . Tobacco comment: 09/03/19 < 1.5 ppd  Substance and Sexual Activity  . Alcohol use: Yes    Alcohol/week: 24.0 standard drinks    Types: 24 Cans of  beer per week    Comment: more since fall 2012, 09/03/19 few shots daily  . Drug use: No  . Sexual activity: Yes    Partners: Male    Birth control/protection: Surgical  Other Topics Concern  . Not on file  Social History Narrative   Lives with her husband, their son Yong Channel, and her daughter Museum/gallery conservator and Amber's son [name].   Social Determinants of Health   Financial Resource Strain:   . Difficulty of Paying Living Expenses:   Food Insecurity:   . Worried  About Charity fundraiser in the Last Year:   . Arboriculturist in the Last Year:   Transportation Needs:   . Film/video editor (Medical):   Marland Kitchen Lack of Transportation (Non-Medical):   Physical Activity:   . Days of Exercise per Week:   . Minutes of Exercise per Session:   Stress:   . Feeling of Stress :   Social Connections:   . Frequency of Communication with Friends and Family:   . Frequency of Social Gatherings with Friends and Family:   . Attends Religious Services:   . Active Member of Clubs or Organizations:   . Attends Archivist Meetings:   Marland Kitchen Marital Status:   Intimate Partner Violence:   . Fear of Current or Ex-Partner:   . Emotionally Abused:   Marland Kitchen Physically Abused:   . Sexually Abused:     Family History  Problem Relation Age of Onset  . Cancer Mother   . COPD Mother        emphysema  . Heart disease Father 63       s/p 3V CABG  . Depression Father   . Barrett's esophagus Father   . Mental illness Sister        depression  . Depression Sister   . Mental illness Son        ADHD, bipolar disorder  . Diabetes Maternal Grandmother   . Heart disease Maternal Grandmother   . Diabetes Maternal Grandfather   . Cancer Maternal Grandfather        lung cancer  . Diabetes Paternal Grandmother   . Kidney disease Paternal Grandmother   . Diabetes Paternal Grandfather   . Heart disease Paternal Grandfather     Past Medical History:  Diagnosis Date  . ADD (attention deficit disorder)   . Allergy   . Anemia   . Anxiety   . Coronary artery disease    a. s/p NSTEMI in 2015 with angioplasty alone to mid-LAD, repeat cath within the same month showing a patent site  . Depression   . Depression   . Fatty liver   . GAD (generalized anxiety disorder)   . GERD (gastroesophageal reflux disease)   . Gestational diabetes   . History of heart attack   . Hyperlipidemia   . Hypertension   . Insomnia   . Lupus ([place])   . Milia   . Myocardial infarction ([place])  06/2013  . Rectal abnormality   . Sicca syndrome ([place])   . Sjogren's syndrome ([place])   . Telangiectasia   . Vitamin D deficiency     Patient Active Problem List   Diagnosis Date Noted  . Complex ovarian cyst 10/12/2016  . Presence of granulation tissue 10/12/2016  . Hemorrhagic cyst of ovary 10/12/2016  . Edema 08/24/2016  . Drug therapy 12/20/2015  . Sjogren's syndrome ([place]) 12/20/2015  . Elevated liver function tests 12/19/2015  . History of myocardial infarction  12/06/2015  . ANA positive 08/18/2015  . Mouth dryness 08/18/2015  . GAD (generalized anxiety disorder) 08/17/2015  . Recurrent major depressive disorder ([place]) 08/17/2015  . Left foot pain 07/04/2015  . Telangiectasia 07/04/2015  . Elevated glucose 05/04/2015  . Milia 05/04/2015  . Vitamin D deficiency 05/04/2015  . Anemia 04/27/2015  . Fatigue 04/27/2015  . Health care maintenance 04/27/2015  . History of MI (myocardial infarction) 04/27/2015  . Hot flashes 04/27/2015  . Low back pain radiating to both legs 03/10/2014  . Bilateral carotid artery disease ([place]) 02/17/2014  . First degree hemorrhoids 01/15/2014  . Gastroesophageal reflux disease without esophagitis 01/15/2014  . Iron deficiency anemia 01/13/2014  . RBC microcytosis 01/13/2014  . CAD S/P LAD DES May 2015 12/17/2013  . HTN (hypertension) 06/25/2013  . High cholesterol 06/25/2013  . Chest pain with moderate risk of acute coronary syndrome 06/24/2013  . NSTEMI (non-ST elevated myocardial infarction) ([place]) 06/24/2013  . Myocardial infarction ([place]) 06/19/2013  . Tobacco abuse 06/10/2012  . Allergic rhinitis 06/10/2012  . ADD (attention deficit disorder) 03/06/2012  . Depressive disorder 04/12/2011    Past Surgical History:  Procedure Laterality Date  . ABDOMINAL HYSTERECTOMY    . CHOLECYSTECTOMY  08/2018  . COLON SURGERY     Flexible Sigmoidoscopy  . COLONOSCOPY N/A 01/08/2014   Procedure: COLONOSCOPY;  Surgeon: Beryle Beams, MD;  Location:  [facility];  Service: Endoscopy;  Laterality: N/A;  . ESOPHAGOGASTRODUODENOSCOPY N/A 01/08/2014   Procedure: ESOPHAGOGASTRODUODENOSCOPY (EGD);  Surgeon: Beryle Beams, MD;  Location: [facility];  Service: Endoscopy;  Laterality: N/A;  . LEFT HEART CATHETERIZATION WITH CORONARY ANGIOGRAM N/A 06/24/2013   Procedure: LEFT HEART CATHETERIZATION WITH CORONARY ANGIOGRAM;  Surgeon: Troy Sine, MD;  Location[facility] LAB;  Service: Cardiovascular;  Laterality: N/A;  . LEFT HEART CATHETERIZATION WITH CORONARY ANGIOGRAM N/A 07/17/2013   Procedure: LEFT HEART CATHETERIZATION WITH CORONARY ANGIOGRAM;  Surgeon: Burnell Blanks, MD;  Location: [facility] CATH LAB;  Service: Cardiovascular;  Laterality: N/A;  . TONSILECTOMY, ADENOIDECTOMY, BILATERAL MYRINGOTOMY AND TUBES    . WRIST SURGERY  1987   RIGHT; bone from forearm grafted to wrist    Current Outpatient Medications  Medication Sig Dispense Refill  . amphetamine-dextroamphetamine (ADDERALL) 15 MG tablet Take 0.5 tablets by mouth 2 (two) times daily. (Patient taking differently: Take 15 mg by mouth 2 (two) times daily. ) 60 tablet 0  . aspirin EC 81 MG tablet Take 81 mg by mouth daily.    Marland Kitchen ezetimibe (ZETIA) 10 MG tablet Take 1 tablet (10 mg total) by mouth daily. 90 tablet 3  . gabapentin (NEURONTIN) 100 MG capsule Take 100 mg by mouth every 6 (six) hours.    Marland Kitchen losartan (COZAAR) 50 MG tablet Take 1.5 tablets (75 mg total) by mouth daily. 135 tablet 3  . metoprolol tartrate (LOPRESSOR) 25 MG tablet Take 1 tablet (25 mg total) by mouth 2 (two) times daily. OFFICE VISIT NEEDED 30 tablet 1  . nitroGLYCERIN (NITROSTAT) 0.4 MG SL tablet Place 1 tablet (0.4 mg total) under the tongue every 5 (five) minutes as needed for chest pain. 25 tablet 3  . omeprazole (PRILOSEC) 20 MG capsule Take 20 mg by mouth 2 (two) times daily before a meal.    . ondansetron (ZOFRAN) 4 MG tablet Take 1 tablet (4 mg total) by mouth every 8 (eight) hours as needed for nausea or  vomiting. 12 tablet 0  . pilocarpine (SALAGEN) 5 MG tablet Take 5 mg by mouth  3 (three) times daily.     . promethazine (PHENERGAN) 25 MG tablet Take 1 tablet (25 mg total) by mouth every 6 (six) hours as needed for nausea or vomiting. 30 tablet 0  . zolpidem (AMBIEN) 5 MG tablet Take 5 mg by mouth at bedtime.     . methylPREDNISolone (MEDROL DOSEPAK) 4 MG TBPK tablet Take the pills all together daily with food for 6 days. Preferably in the morning. 21 tablet 1  . pregabalin (LYRICA) 50 MG capsule Take 1 capsule (50 mg total) by mouth 3 (three) times daily. 90 capsule 2  . sucralfate (CARAFATE) 1 g tablet Take 1 g by mouth at bedtime. (Patient not taking: Reported on 09/03/2019)     No current facility-administered medications for this visit.    Allergies as of 09/03/2019 - Review Complete 09/03/2019  Allergen Reaction Noted  . Buprenorphine hcl Nausea And Vomiting 07/19/2015  . Doxycycline Nausea Only 09/02/2019  . Morphine Nausea And Vomiting 07/19/2015  . Morphine and related Nausea And Vomiting 06/24/2013  . Ace inhibitors Cough 07/19/2015    Vitals: BP 131/79   Pulse 97   Ht _0  (1.6 m)   Wt 118 lb (53.5 kg)   BMI 20.90 kg/m  Last Weight:  Wt Readings from Last 1 Encounters:  09/03/19 118 lb (53.5 kg)   Last Height:   Ht Readings from Last 1 Encounters:  09/03/19 _1  (1.6 m)     Physical exam: Exam: Gen: NAD, conversant, anxious                    CV: RRR, no MRG. No Carotid Bruits. No peripheral edema, warm, nontender Eyes: Conjunctivae clear without exudates or hemorrhage  Neuro: Detailed Neurologic Exam  Speech:    Speech is normal; fluent and spontaneous with normal comprehension.  Cognition:    The patient is oriented to person, place, and time;     recent and remote memory intact;     language fluent;     normal attention, concentration,     fund of knowledge Cranial Nerves:    The pupils are equal, round, and reactive to light. The fundi are  normal and spontaneous venous pulsations are present. Visual fields are full to finger confrontation. Extraocular movements are intact. Trigeminal sensation is intact and the muscles of mastication are normal. The face is symmetric. The palate elevates in the midline. Hearing intact. Voice is normal. Shoulder shrug is normal. The tongue has normal motion without fasciculations.   Coordination:    Normal finger to nose and heel to shin.  Gait:    Imbalance, wide based; Imbalance with heel and toe almost falls with tandem. Can stand from chair without using hands.  Motor Observation:    No asymmetry, no atrophy, and no involuntary movements noted. Tone:    Normal muscle tone.    Posture:    Posture is normal. normal erect    Strength: Poor effort some generalized weakness but more than physiologically appears (for example hip flexion 3-/5 which is not consistent since she can walk independently)        Sensation: Decreased pp an dtemp to the knees Impaired proprioception     Reflex Exam:  DTR's: Absent lowers, 2+ biceps  Toes:    The toes are downgoing bilaterally.   Clonus:    Clonus is absent.    Assessment/Plan:  54 y.o. female here as requested by Karleen Hampshire., MD for gait abnormality.  Past medical  history alcohol abuse, hot flashes, hypertension, bilateral carotid artery disease, mouth dryness, sicca syndrome, GERD, fatty liver, intractable vomiting with nausea, hyperlipidemia, elevated glucose, recurrent major depression, generalized anxiety disorder, edema, insomnia, Sjogren's, prediabetes.  She has an allergy to buprenorphine, morphine, ACE inhibitors and doxycycline.  I reviewed Dr. Dian Situ notes that were provided which had no history absolutely of any symptoms in them.  She is a current every day smoker.  Patient reports that she has had weakness in numbness and paresthesias since Covid vaccine. Her exam does show absent lower extremity reflexes and decrease to all sensory  modalities to above the knees however she has an extensive history of alcohol abuse which could be the cause. She does have weakness on exam but she has poor effort and weakness appears nonphysiologic for example she has bilateral hip flexion weakness 3-/ 5 which would make it impossible for her to walk independently which she does. 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 So very unclear on whether Covid has caused an inflammatory neuropathy (she said it started from the head down to the toes which is inconsistent with Guillain-Barr) however we have seen neurological consequences with Covid vaccine. Very unclear what is going on here she will need a thorough examination.  [place] neurolog[facility] Dr Berdine Addison - request records MRI brain and cervical spine: To evaluate for stroke, cerebellar atrophy, demyelinating lesions, myelopathy, transverse myelitis, or any other etiology for her ataxia, imbalance, gait abnormality, falls. Blood work for other causes of neuropathy Emg/ncs: one arm and one leg Lumbar Puncture: cell count, protein, glucose PT: for ataxia and gait abnormality Start Lyrica for the pain/paresthesias   Orders Placed This Encounter  Procedures  . MR BRAIN W WO CONTRAST  . MR CERVICAL SPINE WO CONTRAST  . DG FLUORO GUIDED LOC OF NEEDLE/CATH TIP FOR SPINAL INJECT RT  . Hemoglobin A1c  . B12 and Folate Panel  . Methylmalonic acid, serum  . Vitamin B1  . Vitamin B6  . TSH  . Sedimentation rate  . Heavy metals, blood  . Multiple Myeloma Panel (SPEP&IFE w/QIG)  . Ambulatory referral to Physical Therapy  . NCV with EMG(electromyography)   Meds ordered this encounter  Medications  . pregabalin (LYRICA) 50 MG capsule    Sig: Take 1 capsule (50 mg total) by mouth 3 (three) times daily.    Dispense:  90 capsule    Refill:  2  . methylPREDNISolone (MEDROL DOSEPAK) 4 MG TBPK tablet    Sig: Take the pills all  together daily with food for 6 days. Preferably in the morning.    Dispense:  21 tablet    Refill:  1    Cc: Karleen Hampshire., MD  Sarina Ill, MD  [facility] [address] [place], [place] [address]  Phone [phone] Fax [phone]

## 2019-09-06 ENCOUNTER — Encounter: Payer: Self-pay | Admitting: Neurology

## 2019-09-07 ENCOUNTER — Telehealth: Payer: Self-pay | Admitting: Neurology

## 2019-09-07 ENCOUNTER — Other Ambulatory Visit: Payer: Self-pay | Admitting: Neurology

## 2019-09-07 MED ORDER — ALPRAZOLAM 0.25 MG PO TABS: ORAL_TABLET | ORAL | 0 refills | Status: DC

## 2019-09-07 NOTE — Progress Notes (Signed)
xanax

## 2019-09-07 NOTE — Telephone Encounter (Signed)
Sent in Xanax thanks let her know not to drive while taking this thanks

## 2019-09-07 NOTE — Telephone Encounter (Signed)
Patient is coming in for MRI 09/08/2019 she needs something to take because she is claustrophobic. [facility] .

## 2019-09-08 ENCOUNTER — Other Ambulatory Visit: Payer: Self-pay

## 2019-09-08 ENCOUNTER — Ambulatory Visit: Payer: 59

## 2019-09-08 DIAGNOSIS — R270 Ataxia, unspecified: Secondary | ICD-10-CM

## 2019-09-08 DIAGNOSIS — R202 Paresthesia of skin: Secondary | ICD-10-CM | POA: Diagnosis not present

## 2019-09-08 DIAGNOSIS — G3281 Cerebellar ataxia in diseases classified elsewhere: Secondary | ICD-10-CM | POA: Diagnosis not present

## 2019-09-08 DIAGNOSIS — W19XXXA Unspecified fall, initial encounter: Secondary | ICD-10-CM

## 2019-09-08 DIAGNOSIS — R269 Unspecified abnormalities of gait and mobility: Secondary | ICD-10-CM

## 2019-09-08 MED ORDER — GADOBENATE DIMEGLUMINE 529 MG/ML IV SOLN
10.0000 mL | Freq: Once | INTRAVENOUS | Status: AC | PRN
Start: 2019-09-08 — End: 2019-09-08
  Administered 2019-09-08: 10 mL via INTRAVENOUS

## 2019-09-08 NOTE — Telephone Encounter (Signed)
09/07/2019 Noted patient aware.

## 2019-09-09 ENCOUNTER — Ambulatory Visit (HOSPITAL_COMMUNITY): Payer: 59 | Attending: Cardiovascular Disease

## 2019-09-09 ENCOUNTER — Telehealth: Payer: Self-pay | Admitting: *Deleted

## 2019-09-09 DIAGNOSIS — I10 Essential (primary) hypertension: Secondary | ICD-10-CM | POA: Diagnosis present

## 2019-09-09 DIAGNOSIS — I779 Disorder of arteries and arterioles, unspecified: Secondary | ICD-10-CM

## 2019-09-09 DIAGNOSIS — I2510 Atherosclerotic heart disease of native coronary artery without angina pectoris: Secondary | ICD-10-CM | POA: Diagnosis present

## 2019-09-09 DIAGNOSIS — I214 Non-ST elevation (NSTEMI) myocardial infarction: Secondary | ICD-10-CM | POA: Diagnosis present

## 2019-09-09 DIAGNOSIS — Z9861 Coronary angioplasty status: Secondary | ICD-10-CM | POA: Diagnosis present

## 2019-09-09 LAB — ECHOCARDIOGRAM COMPLETE
Area-P 1/2: 5.02 cm2
S' Lateral: 2.8 cm

## 2019-09-09 NOTE — Telephone Encounter (Signed)
2 days is not enugh time for a medication to work. We can increase her Lyrica to 100mg  twice daily for a week and then 3x daily but it takes time to work thanks. If she is ok with it I would like to increase her Lyrica and if that isn't enough we can increase further, Lyrica really is a great medication.

## 2019-09-09 NOTE — Addendum Note (Signed)
Addended by: Judi Cong on: 09/09/2019 09:44 AM   Modules accepted: Orders

## 2019-09-09 NOTE — Telephone Encounter (Signed)
R/c notes from [facility] neurological, notes on Dr [name] desk

## 2019-09-09 NOTE — Telephone Encounter (Signed)
Called the patient back and advised that these medication takes time before start to notice effects. Advised the patient of the titration schedule for the patient to increase to, to see if there is any benefit. Pt asked is she suppose to take gabapentin. I advised that both are typically not taken together and verified verbally with Dr [name] that she does need to stop the gabapentin. Patient asked for me to send a mychart message with the titration schedule.

## 2019-09-09 NOTE — Telephone Encounter (Signed)
The patient called with continued concerns of burning, tingling, shocking pains in her bilateral feet. She tried pregabalin 50mg  TID for two days without any benefit. She went back to taking gabapentin 100mg , 2 capsules TID. She is still not getting relief. She was tearful on the phone and requested an alternate medication for her symptoms. She has NCV/EMG on 10/01/19. LP on 09/16/19. Completed MRI brain on 09/08/19.

## 2019-09-11 ENCOUNTER — Ambulatory Visit
Admission: RE | Admit: 2019-09-11 | Discharge: 2019-09-11 | Disposition: A | Discharge status: Home / Self Care | Payer: 59 | Source: Ambulatory Visit | Attending: Neurology | Admitting: Neurology

## 2019-09-11 DIAGNOSIS — R270 Ataxia, unspecified: Secondary | ICD-10-CM

## 2019-09-11 DIAGNOSIS — R269 Unspecified abnormalities of gait and mobility: Secondary | ICD-10-CM

## 2019-09-11 DIAGNOSIS — W19XXXA Unspecified fall, initial encounter: Secondary | ICD-10-CM

## 2019-09-11 DIAGNOSIS — R202 Paresthesia of skin: Secondary | ICD-10-CM

## 2019-09-11 DIAGNOSIS — R200 Anesthesia of skin: Secondary | ICD-10-CM

## 2019-09-11 LAB — MULTIPLE MYELOMA PANEL, SERUM
Albumin SerPl Elph-Mcnc: 2.8 g/dL — ABNORMAL LOW (ref 2.9–4.4)
Albumin/Glob SerPl: 0.9 (ref 0.7–1.7)
Alpha 1: 0.3 g/dL (ref 0.0–0.4)
Alpha2 Glob SerPl Elph-Mcnc: 0.9 g/dL (ref 0.4–1.0)
B-Globulin SerPl Elph-Mcnc: 1.1 g/dL (ref 0.7–1.3)
Gamma Glob SerPl Elph-Mcnc: 1.1 g/dL (ref 0.4–1.8)
Globulin, Total: 3.5 g/dL (ref 2.2–3.9)
IgA/Immunoglobulin A, Serum: 238 mg/dL (ref 87–352)
IgG (Immunoglobin G), Serum: 1098 mg/dL (ref 586–1602)
IgM (Immunoglobulin M), Srm: 158 mg/dL (ref 26–217)
Total Protein: 6.3 g/dL (ref 6.0–8.5)

## 2019-09-11 LAB — VITAMIN B1: Thiamine: 74 nmol/L (ref 66.5–200.0)

## 2019-09-11 LAB — HEAVY METALS, BLOOD
Arsenic: 5 ug/L (ref 2–23)
Lead, Blood: 2 ug/dL (ref 0–4)
Mercury: <1 ug/L (ref 0.0–14.9)

## 2019-09-11 LAB — HEMOGLOBIN A1C
Est. average glucose Bld gHb Est-mCnc: 105 mg/dL
Hgb A1c MFr Bld: 5.3 % (ref 4.8–5.6)

## 2019-09-11 LAB — B12 AND FOLATE PANEL
Folate: 6.9 ng/mL (ref >3.0)
Vitamin B-12: 581 pg/mL (ref 232–1245)

## 2019-09-11 LAB — METHYLMALONIC ACID, SERUM: Methylmalonic Acid: 149 nmol/L (ref 0–378)

## 2019-09-11 LAB — VITAMIN B6: Vitamin B6: 2.8 ug/L (ref 2.0–32.8)

## 2019-09-11 LAB — SEDIMENTATION RATE: Sed Rate: 23 mm/hr (ref 0–40)

## 2019-09-11 LAB — TSH: TSH: 1.71 u[IU]/mL (ref 0.450–4.500)

## 2019-09-11 MED ORDER — DIAZEPAM 5 MG PO TABS
5.0000 mg | ORAL_TABLET | Freq: Once | ORAL | Status: AC
  Administered 2019-09-11: 5 mg via ORAL

## 2019-09-11 NOTE — Discharge Instructions (Signed)

## 2019-09-12 LAB — CSF CELL COUNT WITH DIFFERENTIAL
RBC Count, CSF: 0 cells/uL
WBC, CSF: 1 cells/uL (ref 0–5)

## 2019-09-12 LAB — PROTEIN, CSF: Total Protein, CSF: 44 mg/dL (ref 15–45)

## 2019-09-12 LAB — GLUCOSE, CSF: Glucose, CSF: 82 mg/dL — ABNORMAL HIGH (ref 40–80)

## 2019-09-13 ENCOUNTER — Emergency Department (HOSPITAL_COMMUNITY): Admission: EM | Admit: 2019-09-13 | Discharge: 2019-09-13 | Discharge status: Against Medical Advice | Payer: 59

## 2019-09-13 DIAGNOSIS — G629 Polyneuropathy, unspecified: Secondary | ICD-10-CM

## 2019-09-14 ENCOUNTER — Other Ambulatory Visit: Payer: Self-pay | Admitting: Neurology

## 2019-09-14 DIAGNOSIS — R202 Paresthesia of skin: Secondary | ICD-10-CM

## 2019-09-14 MED ORDER — PREGABALIN 100 MG PO CAPS: 100.0000 mg | ORAL_CAPSULE | Freq: Three times a day (TID) | ORAL | 2 refills | Status: DC

## 2019-09-16 ENCOUNTER — Other Ambulatory Visit: Payer: 59

## 2019-09-18 ENCOUNTER — Encounter (HOSPITAL_COMMUNITY): Payer: Self-pay | Admitting: Emergency Medicine

## 2019-09-18 ENCOUNTER — Inpatient Hospital Stay (HOSPITAL_COMMUNITY)
Admission: EM | Admit: 2019-09-18 | Discharge: 2019-09-22 | DRG: 074 | Disposition: A | Discharge status: Home / Self Care | Payer: 59 | Attending: Internal Medicine | Admitting: Internal Medicine

## 2019-09-18 ENCOUNTER — Other Ambulatory Visit: Payer: Self-pay

## 2019-09-18 DIAGNOSIS — R292 Abnormal reflex: Secondary | ICD-10-CM | POA: Diagnosis present

## 2019-09-18 DIAGNOSIS — Z888 Allergy status to other drugs, medicaments and biological substances status: Secondary | ICD-10-CM

## 2019-09-18 DIAGNOSIS — I2510 Atherosclerotic heart disease of native coronary artery without angina pectoris: Secondary | ICD-10-CM | POA: Diagnosis present

## 2019-09-18 DIAGNOSIS — K760 Fatty (change of) liver, not elsewhere classified: Secondary | ICD-10-CM | POA: Diagnosis present

## 2019-09-18 DIAGNOSIS — I252 Old myocardial infarction: Secondary | ICD-10-CM | POA: Diagnosis not present

## 2019-09-18 DIAGNOSIS — T50B95A Adverse effect of other viral vaccines, initial encounter: Secondary | ICD-10-CM | POA: Diagnosis present

## 2019-09-18 DIAGNOSIS — E785 Hyperlipidemia, unspecified: Secondary | ICD-10-CM | POA: Diagnosis present

## 2019-09-18 DIAGNOSIS — Z955 Presence of coronary angioplasty implant and graft: Secondary | ICD-10-CM

## 2019-09-18 DIAGNOSIS — M79602 Pain in left arm: Secondary | ICD-10-CM

## 2019-09-18 DIAGNOSIS — Z881 Allergy status to other antibiotic agents status: Secondary | ICD-10-CM

## 2019-09-18 DIAGNOSIS — M79601 Pain in right arm: Secondary | ICD-10-CM | POA: Diagnosis not present

## 2019-09-18 DIAGNOSIS — F988 Other specified behavioral and emotional disorders with onset usually occurring in childhood and adolescence: Secondary | ICD-10-CM | POA: Diagnosis present

## 2019-09-18 DIAGNOSIS — G629 Polyneuropathy, unspecified: Secondary | ICD-10-CM

## 2019-09-18 DIAGNOSIS — N179 Acute kidney failure, unspecified: Secondary | ICD-10-CM | POA: Diagnosis not present

## 2019-09-18 DIAGNOSIS — M329 Systemic lupus erythematosus, unspecified: Secondary | ICD-10-CM | POA: Diagnosis present

## 2019-09-18 DIAGNOSIS — F1010 Alcohol abuse, uncomplicated: Secondary | ICD-10-CM | POA: Diagnosis present

## 2019-09-18 DIAGNOSIS — F17210 Nicotine dependence, cigarettes, uncomplicated: Secondary | ICD-10-CM | POA: Diagnosis present

## 2019-09-18 DIAGNOSIS — M79606 Pain in leg, unspecified: Secondary | ICD-10-CM | POA: Diagnosis not present

## 2019-09-18 DIAGNOSIS — Z20822 Contact with and (suspected) exposure to covid-19: Secondary | ICD-10-CM | POA: Diagnosis present

## 2019-09-18 DIAGNOSIS — R202 Paresthesia of skin: Secondary | ICD-10-CM | POA: Diagnosis not present

## 2019-09-18 DIAGNOSIS — Z9181 History of falling: Secondary | ICD-10-CM

## 2019-09-18 DIAGNOSIS — G6289 Other specified polyneuropathies: Principal | ICD-10-CM | POA: Diagnosis present

## 2019-09-18 DIAGNOSIS — E876 Hypokalemia: Secondary | ICD-10-CM | POA: Diagnosis present

## 2019-09-18 DIAGNOSIS — I10 Essential (primary) hypertension: Secondary | ICD-10-CM | POA: Diagnosis present

## 2019-09-18 DIAGNOSIS — G608 Other hereditary and idiopathic neuropathies: Secondary | ICD-10-CM

## 2019-09-18 DIAGNOSIS — Z79899 Other long term (current) drug therapy: Secondary | ICD-10-CM

## 2019-09-18 DIAGNOSIS — K219 Gastro-esophageal reflux disease without esophagitis: Secondary | ICD-10-CM | POA: Diagnosis present

## 2019-09-18 DIAGNOSIS — F411 Generalized anxiety disorder: Secondary | ICD-10-CM | POA: Diagnosis present

## 2019-09-18 DIAGNOSIS — E440 Moderate protein-calorie malnutrition: Secondary | ICD-10-CM | POA: Diagnosis present

## 2019-09-18 DIAGNOSIS — Z833 Family history of diabetes mellitus: Secondary | ICD-10-CM

## 2019-09-18 DIAGNOSIS — M3500 Sicca syndrome, unspecified: Secondary | ICD-10-CM | POA: Diagnosis present

## 2019-09-18 DIAGNOSIS — Z6821 Body mass index (BMI) 21.0-21.9, adult: Secondary | ICD-10-CM

## 2019-09-18 DIAGNOSIS — Z90710 Acquired absence of both cervix and uterus: Secondary | ICD-10-CM

## 2019-09-18 DIAGNOSIS — Z885 Allergy status to narcotic agent status: Secondary | ICD-10-CM

## 2019-09-18 DIAGNOSIS — G4700 Insomnia, unspecified: Secondary | ICD-10-CM | POA: Diagnosis present

## 2019-09-18 DIAGNOSIS — Z8249 Family history of ischemic heart disease and other diseases of the circulatory system: Secondary | ICD-10-CM

## 2019-09-18 DIAGNOSIS — E781 Pure hyperglyceridemia: Secondary | ICD-10-CM | POA: Diagnosis present

## 2019-09-18 DIAGNOSIS — M4802 Spinal stenosis, cervical region: Secondary | ICD-10-CM | POA: Diagnosis present

## 2019-09-18 DIAGNOSIS — Z801 Family history of malignant neoplasm of trachea, bronchus and lung: Secondary | ICD-10-CM

## 2019-09-18 DIAGNOSIS — Z825 Family history of asthma and other chronic lower respiratory diseases: Secondary | ICD-10-CM

## 2019-09-18 DIAGNOSIS — Z7982 Long term (current) use of aspirin: Secondary | ICD-10-CM

## 2019-09-18 HISTORY — DX: Other hereditary and idiopathic neuropathies: G60.8

## 2019-09-18 LAB — CBC WITH DIFFERENTIAL/PLATELET
Abs Immature Granulocytes: 0.02 10*3/uL (ref 0.00–0.07)
Basophils Absolute: 0.1 10*3/uL (ref 0.0–0.1)
Basophils Relative: 1 %
Eosinophils Absolute: 0.1 10*3/uL (ref 0.0–0.5)
Eosinophils Relative: 2 %
HCT: 41.7 % (ref 36.0–46.0)
Hemoglobin: 13.8 g/dL (ref 12.0–15.0)
Immature Granulocytes: 0 %
Lymphocytes Relative: 42 %
Lymphs Abs: 3.9 10*3/uL (ref 0.7–4.0)
MCH: 35 pg — ABNORMAL HIGH (ref 26.0–34.0)
MCHC: 33.1 g/dL (ref 30.0–36.0)
MCV: 105.8 fL — ABNORMAL HIGH (ref 80.0–100.0)
Monocytes Absolute: 0.6 10*3/uL (ref 0.1–1.0)
Monocytes Relative: 7 %
Neutro Abs: 4.5 10*3/uL (ref 1.7–7.7)
Neutrophils Relative %: 48 %
Platelets: 246 10*3/uL (ref 150–400)
RBC: 3.94 MIL/uL (ref 3.87–5.11)
RDW: 13.5 % (ref 11.5–15.5)
WBC: 9.2 10*3/uL (ref 4.0–10.5)
nRBC: 0 % (ref 0.0–0.2)

## 2019-09-18 LAB — LIPID PANEL
Cholesterol: 194 mg/dL (ref 0–200)
HDL: 32 mg/dL — ABNORMAL LOW (ref >40)
LDL Cholesterol: 121 mg/dL — ABNORMAL HIGH (ref 0–99)
Total CHOL/HDL Ratio: 6.1 RATIO
Triglycerides: 206 mg/dL — ABNORMAL HIGH (ref <150)
VLDL: 41 mg/dL — ABNORMAL HIGH (ref 0–40)

## 2019-09-18 LAB — BASIC METABOLIC PANEL
Anion gap: 14 (ref 5–15)
Anion gap: 14 (ref 5–15)
BUN: 7 mg/dL (ref 6–20)
BUN: 7 mg/dL (ref 6–20)
CO2: 23 mmol/L (ref 22–32)
CO2: 23 mmol/L (ref 22–32)
Calcium: 9 mg/dL (ref 8.9–10.3)
Calcium: 9 mg/dL (ref 8.9–10.3)
Chloride: 103 mmol/L (ref 98–111)
Chloride: 103 mmol/L (ref 98–111)
Creatinine, Ser: 0.88 mg/dL (ref 0.44–1.00)
Creatinine, Ser: 0.88 mg/dL (ref 0.44–1.00)
GFR calc Af Amer: >60 mL/min (ref >60)
GFR calc Af Amer: >60 mL/min (ref >60)
GFR calc non Af Amer: >60 mL/min (ref >60)
GFR calc non Af Amer: >60 mL/min (ref >60)
Glucose, Bld: 119 mg/dL — ABNORMAL HIGH (ref 70–99)
Glucose, Bld: 119 mg/dL — ABNORMAL HIGH (ref 70–99)
Potassium: 3.4 mmol/L — ABNORMAL LOW (ref 3.5–5.1)
Potassium: 3.4 mmol/L — ABNORMAL LOW (ref 3.5–5.1)
Sodium: 140 mmol/L (ref 135–145)
Sodium: 140 mmol/L (ref 135–145)

## 2019-09-18 LAB — SARS CORONAVIRUS 2 BY RT PCR (HOSPITAL ORDER, PERFORMED IN ~~LOC~~ HOSPITAL LAB): SARS Coronavirus 2: NEGATIVE

## 2019-09-18 LAB — HIV ANTIBODY (ROUTINE TESTING W REFLEX): HIV Screen 4th Generation wRfx: NONREACTIVE

## 2019-09-18 MED ORDER — SUCRALFATE 1 G PO TABS
1.0000 g | ORAL_TABLET | Freq: Every day | ORAL | Status: DC
  Filled 2019-09-18: qty 1

## 2019-09-18 MED ORDER — ACETAMINOPHEN 325 MG PO TABS
650.0000 mg | ORAL_TABLET | Freq: Four times a day (QID) | ORAL | Status: DC | PRN
  Administered 2019-09-19 (×2): 650 mg via ORAL
  Filled 2019-09-18 (×2): qty 2

## 2019-09-18 MED ORDER — PILOCARPINE HCL 5 MG PO TABS
5.0000 mg | ORAL_TABLET | Freq: Three times a day (TID) | ORAL | Status: DC
  Administered 2019-09-18 – 2019-09-22 (×13): 5 mg via ORAL
  Filled 2019-09-18 (×15): qty 1

## 2019-09-18 MED ORDER — ACETAMINOPHEN 650 MG RE SUPP: 650.0000 mg | Freq: Four times a day (QID) | RECTAL | Status: DC | PRN

## 2019-09-18 MED ORDER — NITROGLYCERIN 0.4 MG SL SUBL: 0.4000 mg | SUBLINGUAL_TABLET | SUBLINGUAL | Status: DC | PRN

## 2019-09-18 MED ORDER — ENOXAPARIN SODIUM 40 MG/0.4ML ~~LOC~~ SOLN
40.0000 mg | SUBCUTANEOUS | Status: DC
  Filled 2019-09-18 (×3): qty 0.4

## 2019-09-18 MED ORDER — METOPROLOL TARTRATE 5 MG/5ML IV SOLN: 5.0000 mg | Freq: Four times a day (QID) | INTRAVENOUS | Status: DC | PRN

## 2019-09-18 MED ORDER — ZOLPIDEM TARTRATE 5 MG PO TABS
5.0000 mg | ORAL_TABLET | Freq: Once | ORAL | Status: AC
  Administered 2019-09-18: 5 mg via ORAL
  Filled 2019-09-18: qty 1

## 2019-09-18 MED ORDER — EZETIMIBE 10 MG PO TABS
10.0000 mg | ORAL_TABLET | Freq: Every day | ORAL | Status: DC
  Administered 2019-09-18 – 2019-09-22 (×5): 10 mg via ORAL
  Filled 2019-09-18 (×5): qty 1

## 2019-09-18 MED ORDER — ONDANSETRON HCL 4 MG/2ML IJ SOLN
4.0000 mg | Freq: Once | INTRAMUSCULAR | Status: AC
  Administered 2019-09-18: 4 mg via INTRAVENOUS
  Filled 2019-09-18: qty 2

## 2019-09-18 MED ORDER — ENSURE ENLIVE PO LIQD
237.0000 mL | Freq: Two times a day (BID) | ORAL | Status: DC
  Administered 2019-09-19 – 2019-09-21 (×5): 237 mL via ORAL

## 2019-09-18 MED ORDER — KETOROLAC TROMETHAMINE 30 MG/ML IJ SOLN
30.0000 mg | Freq: Four times a day (QID) | INTRAMUSCULAR | Status: DC | PRN
  Administered 2019-09-19 – 2019-09-22 (×3): 30 mg via INTRAVENOUS
  Filled 2019-09-18 (×3): qty 1

## 2019-09-18 MED ORDER — LOSARTAN POTASSIUM 50 MG PO TABS
75.0000 mg | ORAL_TABLET | Freq: Every day | ORAL | Status: DC
  Administered 2019-09-18 – 2019-09-22 (×5): 75 mg via ORAL
  Filled 2019-09-18: qty 2
  Filled 2019-09-18 (×4): qty 1

## 2019-09-18 MED ORDER — HYDROMORPHONE HCL 1 MG/ML IJ SOLN
0.5000 mg | Freq: Once | INTRAMUSCULAR | Status: AC
  Administered 2019-09-18: 0.5 mg via INTRAVENOUS
  Filled 2019-09-18: qty 0.5

## 2019-09-18 MED ORDER — PANTOPRAZOLE SODIUM 40 MG PO TBEC
40.0000 mg | DELAYED_RELEASE_TABLET | Freq: Every day | ORAL | Status: DC
  Administered 2019-09-18 – 2019-09-22 (×5): 40 mg via ORAL
  Filled 2019-09-18 (×5): qty 1

## 2019-09-18 MED ORDER — ASPIRIN EC 81 MG PO TBEC
81.0000 mg | DELAYED_RELEASE_TABLET | Freq: Every day | ORAL | Status: DC
  Administered 2019-09-18 – 2019-09-22 (×5): 81 mg via ORAL
  Filled 2019-09-18 (×5): qty 1

## 2019-09-18 MED ORDER — HYDROMORPHONE HCL 1 MG/ML IJ SOLN
0.5000 mg | Freq: Once | INTRAMUSCULAR | Status: AC
  Administered 2019-09-18: 0.5 mg via INTRAVENOUS
  Filled 2019-09-18: qty 1

## 2019-09-18 MED ORDER — METOPROLOL TARTRATE 25 MG PO TABS
25.0000 mg | ORAL_TABLET | Freq: Two times a day (BID) | ORAL | Status: DC
  Administered 2019-09-18 – 2019-09-22 (×9): 25 mg via ORAL
  Filled 2019-09-18 (×9): qty 1

## 2019-09-18 MED ORDER — POLYETHYLENE GLYCOL 3350 17 G PO PACK: 17.0000 g | PACK | Freq: Every day | ORAL | Status: DC | PRN

## 2019-09-18 MED ORDER — POTASSIUM CHLORIDE CRYS ER 20 MEQ PO TBCR
20.0000 meq | EXTENDED_RELEASE_TABLET | Freq: Once | ORAL | Status: AC
  Administered 2019-09-18: 20 meq via ORAL
  Filled 2019-09-18: qty 1

## 2019-09-18 MED ORDER — KETOROLAC TROMETHAMINE 30 MG/ML IJ SOLN
30.0000 mg | Freq: Once | INTRAMUSCULAR | Status: AC
  Administered 2019-09-18: 30 mg via INTRAVENOUS
  Filled 2019-09-18: qty 1

## 2019-09-18 MED ORDER — ONDANSETRON HCL 4 MG PO TABS
4.0000 mg | ORAL_TABLET | Freq: Three times a day (TID) | ORAL | Status: DC | PRN
  Administered 2019-09-21: 4 mg via ORAL
  Filled 2019-09-18: qty 1

## 2019-09-18 MED ORDER — IMMUNE GLOBULIN (HUMAN) 10 GM/100ML IV SOLN
400.0000 mg/kg | INTRAVENOUS | Status: AC
  Administered 2019-09-18 – 2019-09-22 (×5): 20 g via INTRAVENOUS
  Filled 2019-09-18 (×5): qty 200

## 2019-09-18 MED ORDER — PREGABALIN 100 MG PO CAPS
100.0000 mg | ORAL_CAPSULE | Freq: Three times a day (TID) | ORAL | Status: DC
  Administered 2019-09-18 – 2019-09-19 (×3): 100 mg via ORAL
  Filled 2019-09-18 (×4): qty 1

## 2019-09-18 NOTE — H&P (Signed)
History and Physical        Hospital Admission Note Date: 09/18/2019  Patient name: Lynn Harvey Medical record number: [id number] Date of birth: [birth year] Age: 54 y.o. Gender: female  PCP: [name], MD  Patient coming from: Home At baseline, ambulates: Independent  Chief Complaint    Chief Complaint  Patient presents with  . Leg Pain      HPI:   This is a 54 year old female with past medical history of CAD, alcohol use, NSTEMI, GERD, hypertension, hyperlipidemia, tobacco use, Sjogren's syndrome, lupus who presented to the ED with progressively worsening paresthesias x1 month.  Symptoms began following her second Covid vaccine on 08/12/2019 when in the evening of the vaccine she developed numbness in her face which progressed to the rest of her body and noticed difficulty with ambulation and vomiting.  She went to the ED with improvement in symptoms without intervention and then 5 days later had an acute recurrence.  She had some difficulty with ambulation at that time.  Also with progressively worsening pain in her hands and feet, described as feeling as though they are on fire.  Has had decreased dexterity and falling at home.  With leg weakness R>L.  She was initially seen by her rheumatologist who started her on gabapentin without improvement and saw Dr. [name], Neurolgy who initially saw her on 7/15 and started a steroid taper and Lyrica.  She had an LP which was reassuring and is scheduled for a nerve conduction test in August.  Currently her symptoms have been progressing and worsening and her Lyrica was increased on 7/25 however still no improvement.  ED Course: Afebrile hemodynamically stable on room air.  Labs essentially unremarkable.  COVID-19 negative.  ED physician spoke with Dr. Otelia Limes, neurology who evaluated the patient in the ED followed by discussion with the  patient's neurologist.  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  She was given Dilaudid and Zofran  Vitals:   09/18/19 1306 09/18/19 1334  BP: (!) 155/84 (!) 157/84  Pulse: 89 84  Resp: 16 16  Temp:    SpO2: 93% 93%     Review of Systems:  Review of Systems  Constitutional: Negative for chills and fever.  Respiratory: Negative for shortness of breath.   Cardiovascular: Negative for chest pain and leg swelling.  Genitourinary: Negative.   Musculoskeletal: Positive for falls.  Neurological: Positive for tingling, sensory change and weakness.  All other systems reviewed and are negative.   Medical/Social/Family History   Past Medical History: Past Medical History:  Diagnosis Date  . ADD (attention deficit disorder)   . Allergy   . Anemia   . Anxiety   . Coronary artery disease    a. s/p NSTEMI in 2015 with angioplasty alone to mid-LAD, repeat cath within the same month showing a patent site  . Depression   . Depression   . Fatty liver   . GAD (generalized anxiety disorder)   . GERD (gastroesophageal reflux disease)   . Gestational diabetes   . History of heart attack   . Hyperlipidemia   . Hypertension   . Insomnia   . Lupus (HCC)   . Milia   .  Myocardial infarction (HCC) 06/2013  . Rectal abnormality   . Sicca syndrome (HCC)   . Sjogren's syndrome (HCC)   . Telangiectasia   . Vitamin D deficiency     Past Surgical History:  Procedure Laterality Date  . ABDOMINAL HYSTERECTOMY    . CHOLECYSTECTOMY  08/2018  . COLON SURGERY     Flexible Sigmoidoscopy  . COLONOSCOPY N/A 01/08/2014   Procedure: COLONOSCOPY;  Surgeon: Theda Belfast, MD;  Location: [facility];  Service: Endoscopy;  Laterality: N/A;  . ESOPHAGOGASTRODUODENOSCOPY N/A 01/08/2014   Procedure: ESOPHAGOGASTRODUODENOSCOPY (EGD);  Surgeon: Theda Belfast, MD;  Location: [facility];  Service: Endoscopy;  Laterality: N/A;  . LEFT HEART CATHETERIZATION WITH CORONARY  ANGIOGRAM N/A 06/24/2013   Procedure: LEFT HEART CATHETERIZATION WITH CORONARY ANGIOGRAM;  Surgeon: [name], MD;  Location: [facility] CATH LAB;  Service: Cardiovascular;  Laterality: N/A;  . LEFT HEART CATHETERIZATION WITH CORONARY ANGIOGRAM N/A 07/17/2013   Procedure: LEFT HEART CATHETERIZATION WITH CORONARY ANGIOGRAM;  Surgeon: Kathleene Hazel, MD;  Location: [facility] CATH LAB;  Service: Cardiovascular;  Laterality: N/A;  . TONSILECTOMY, ADENOIDECTOMY, BILATERAL MYRINGOTOMY AND TUBES    . WRIST SURGERY  1987   RIGHT; bone from forearm grafted to wrist    Medications: Prior to Admission medications   Medication Sig Start Date End Date Taking? Authorizing Provider  amphetamine-dextroamphetamine (ADDERALL) 15 MG tablet Take 0.5 tablets by mouth 2 (two) times daily. Patient taking differently: Take 15 mg by mouth 2 (two) times daily.  05/17/14  Yes Porfirio Oar, PA  aspirin EC 81 MG tablet Take 81 mg by mouth daily.   Yes [provider]  ezetimibe (ZETIA) 10 MG tablet Take 1 tablet (10 mg total) by mouth daily. 08/18/19 11/16/19 Yes [name], MD  losartan (COZAAR) 50 MG tablet Take 1.5 tablets (75 mg total) by mouth daily. 08/18/19  Yes [name], MD  metoprolol tartrate (LOPRESSOR) 25 MG tablet Take 1 tablet (25 mg total) by mouth 2 (two) times daily. OFFICE VISIT NEEDED 08/05/18  Yes [name], MD  nitroGLYCERIN (NITROSTAT) 0.4 MG SL tablet Place 1 tablet (0.4 mg total) under the tongue every 5 (five) minutes as needed for chest pain. 08/31/16  Yes [name], MD  omeprazole (PRILOSEC) 20 MG capsule Take 20 mg by mouth 2 (two) times daily before a meal.   Yes [provider]  ondansetron (ZOFRAN) 4 MG tablet Take 1 tablet (4 mg total) by mouth every 8 (eight) hours as needed for nausea or vomiting. 07/12/18  Yes Fayrene Helper, PA-C  pilocarpine (SALAGEN) 5 MG tablet Take 5 mg by mouth 3 (three) times daily.  08/18/15  Yes [provider]  pregabalin  (LYRICA) 100 MG capsule Take 1 capsule (100 mg total) by mouth 3 (three) times daily. 09/14/19  Yes Anson Fret, MD  promethazine (PHENERGAN) 25 MG tablet Take 1 tablet (25 mg total) by mouth every 6 (six) hours as needed for nausea or vomiting. 08/13/19  Yes Mesner, Barbara Cower, MD  valACYclovir (VALTREX) 500 MG tablet Take 500 mg by mouth 3 (three) times daily as needed (fever blister).   Yes [provider]  zolpidem (AMBIEN) 5 MG tablet Take 5 mg by mouth at bedtime.  12/07/14  Yes [provider]  ALPRAZolam (XANAX) 0.25 MG tablet Take 1-2 tabs (0.25mg -0.50mg ) 30-60 minutes before procedure. May repeat if needed.Do not drive. Patient not taking: Reported on 09/18/2019 09/07/19   Anson Fret, MD  methylPREDNISolone (MEDROL  DOSEPAK) 4 MG TBPK tablet Take the pills all together daily with food for 6 days. Preferably in the morning. Patient not taking: Reported on 09/18/2019 09/03/19   Anson Fret, MD    Allergies:   Allergies  Allergen Reactions  . Buprenorphine Hcl Nausea And Vomiting  . Doxycycline Nausea Only  . Morphine Nausea And Vomiting  . Morphine And Related Nausea And Vomiting  . Ace Inhibitors Cough    Social History:  reports that she has been smoking cigarettes. She started smoking about 36 years ago. She has a 30.00 pack-year smoking history. She has never used smokeless tobacco. She reports current alcohol use of about 24.0 standard drinks of alcohol per week. She reports that she does not use drugs.  Family History: Family History  Problem Relation Age of Onset  . Cancer Mother   . COPD Mother        emphysema  . Heart disease Father 74       s/p 3V CABG  . Depression Father   . Barrett's esophagus Father   . Mental illness Sister        depression  . Depression Sister   . Mental illness Son        ADHD, bipolar disorder  . Diabetes Maternal Grandmother   . Heart disease Maternal Grandmother   . Diabetes Maternal Grandfather   . Cancer  Maternal Grandfather        lung cancer  . Diabetes Paternal Grandmother   . Kidney disease Paternal Grandmother   . Diabetes Paternal Grandfather   . Heart disease Paternal Grandfather      Objective   Physical Exam: Blood pressure (!) 157/84, pulse 84, temperature 98.1 F (36.7 C), temperature source Oral, resp. rate 16, height 5\' 3"  (1.6 m), weight 56 kg, SpO2 93 %.  Physical Exam Vitals and nursing note reviewed.  Constitutional:      Appearance: Normal appearance.  HENT:     Head: Normocephalic and atraumatic.  Eyes:     Conjunctiva/sclera: Conjunctivae normal.  Cardiovascular:     Rate and Rhythm: Normal rate and regular rhythm.  Pulmonary:     Effort: Pulmonary effort is normal.     Breath sounds: Normal breath sounds.  Abdominal:     General: Abdomen is flat.     Palpations: Abdomen is soft.  Musculoskeletal:     Comments: RLE 3/5 muscle strength, LLE 4/5 muscle strength  Skin:    Coloration: Skin is not jaundiced or pale.  Neurological:     Mental Status: She is alert. Mental status is at baseline.  Psychiatric:        Mood and Affect: Mood normal.        Behavior: Behavior normal.     LABS on Admission: I have personally reviewed all the labs and imaging below    Basic Metabolic Panel: Recent Labs  Lab 09/18/19 0559  NA 140  K 3.4*  CL 103  CO2 23  GLUCOSE 119*  BUN 7  CREATININE 0.88  CALCIUM 9.0   Liver Function Tests: No results for input(s): AST, ALT, ALKPHOS, BILITOT, PROT, ALBUMIN in the last 168 hours. No results for input(s): LIPASE, AMYLASE in the last 168 hours. No results for input(s): AMMONIA in the last 168 hours. CBC: Recent Labs  Lab 09/18/19 0559  WBC 9.2  NEUTROABS 4.5  HGB 13.8  HCT 41.7  MCV 105.8*  PLT 246   Cardiac Enzymes: No results for input(s): CKTOTAL, CKMB, CKMBINDEX, TROPONINI  in the last 168 hours. BNP: Invalid input(s): POCBNP CBG: No results for input(s): GLUCAP in the last 168  hours.  Radiological Exams on Admission:  No results found.    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 No improvement with conservative measures c. Continue Lyrica for now d. Neurology consulted -started on IVIG e. PT eval  2. Hypokalemia a. Pleat  3. Hypertension a. Continue home losartan and metoprolol  4. CAD s/p DES May 2015 a. Continue home aspirin and Zetia b. I do not see a statin on her med list c. We will get a lipid panel   DVT prophylaxis: Lovenox   Code Status: Full Code  Diet: Heart healthy Family Communication: Admission, patients condition and plan of care including tests being ordered have been discussed with the patient who indicates understanding and agrees with the plan and Code Status. Disposition Plan: The appropriate patient status for this patient is INPATIENT. Inpatient status is judged to be reasonable and necessary in order to provide the required intensity of service to ensure the patient's safety. The patient's presenting symptoms, physical exam findings, and initial radiographic and laboratory data in the context of their chronic comorbidities is felt to place them at high risk for further clinical deterioration. Furthermore, it is not anticipated that the patient will be medically stable for discharge from the hospital within 2 midnights of admission. The following factors support the patient status of inpatient.   " The patient's presenting symptoms include weakness and peripheral neuropathy. " The worrisome physical exam findings include sensory changes in hands and feet as well as weakness of right lower extremity. " The initial radiographic and laboratory data are unremarkable. " The chronic co-morbidities include rheumatologic history,  recently got Covid vaccine.   * I certify that at the point of admission it is my clinical judgment that the patient will require inpatient hospital care spanning beyond 2 midnights from the point of admission due to high intensity of service, high risk for further deterioration and high frequency of surveillance required.*   Status is: Inpatient  Remains inpatient appropriate because:IV treatments appropriate due to intensity of illness or inability to take PO and Inpatient level of care appropriate due to severity of illness   Dispo: The patient is from: Home              Anticipated d/c is to: Home              Anticipated d/c date is: 3 days              Patient currently is not medically stable to d/c.      Consultants  . Neurology  Procedures  . None  Time Spent on Admission: 68 minutes    Jae Dire, DO [facility] Pager [phone] 09/18/2019, 2:00 PM

## 2019-09-18 NOTE — ED Provider Notes (Signed)
[facility] EMERGENCY DEPARTMENT Provider Note   CSN: [id number] Arrival date & time: 09/18/19  0534     History Chief Complaint  Patient presents with  . Leg Pain    Lynn Harvey is a 54 y.o. female with history of CAD, NSTEMI in 2015, GERD, hypertension, hyperlipidemia, Sjogren's syndrome, lupus presenting for evaluation of progressively worsening paresthesias for approximately 1 month. Symptoms began after receiving her second Covid vaccine on 08/12/2019. That evening she developed numbness to the face which progressed all over her body and noted difficulty with ambulation and vomiting. She went to the ED with improvement in symptoms, then 5 days later had acute recurrence. She had some difficulty with ambulation at that time. Since then she has had progressively worsening pain to the extremities. She describes feeling as though her hands and feet are "on fire". She reports decreased dexterity, cannot type, cannot open packages. She states "I have been trying to unwrap this piece of candy for the last few hours and I cannot do it". She also notes that she has been falling more. Yesterday while ambulating in her room she states that her legs "gave out" and she fell to the ground. No head injury or loss of consciousness. She was initially evaluated by her rheumatologist who started her on gabapentin which was not initially effective and then sent to see neurology and has been followed by Dr. [name] who saw her initially on 09/03/2019, started her on a steroid Dosepak and Lyrica. She has had an LP which was reassuring and is scheduled for a nerve conduction study in early August. Her symptoms have been progressive and worsening and Dr. [name] recently increased her Lyrica dosage on 09/13/2019. The patient notes no change in her symptoms on Lyrica. She is a smoker of around a pack of cigarettes daily, denies recreational drug use. She states that she and her husband have been trying to  "cut back" on their alcohol use and states that more recently they have been drinking a few vodka beverages, 2-3 daily.  The history is provided by the patient.       Past Medical History:  Diagnosis Date  . ADD (attention deficit disorder)   . Allergy   . Anemia   . Anxiety   . Coronary artery disease    a. s/p NSTEMI in 2015 with angioplasty alone to mid-LAD, repeat cath within the same month showing a patent site  . Depression   . Depression   . Fatty liver   . GAD (generalized anxiety disorder)   . GERD (gastroesophageal reflux disease)   . Gestational diabetes   . History of heart attack   . Hyperlipidemia   . Hypertension   . Insomnia   . Lupus (HCC)   . Milia   . Myocardial infarction (HCC) 06/2013  . Rectal abnormality   . Sicca syndrome (HCC)   . Sjogren's syndrome (HCC)   . Telangiectasia   . Vitamin D deficiency     Patient Active Problem List   Diagnosis Date Noted  . Neuropathy 09/18/2019  . Complex ovarian cyst 10/12/2016  . Presence of granulation tissue 10/12/2016  . Hemorrhagic cyst of ovary 10/12/2016  . Edema 08/24/2016  . Drug therapy 12/20/2015  . Sjogren's syndrome (HCC) 12/20/2015  . Elevated liver function tests 12/19/2015  . History of myocardial infarction 12/06/2015  . ANA positive 08/18/2015  . Mouth dryness 08/18/2015  . GAD (generalized anxiety disorder) 08/17/2015  . Recurrent major depressive disorder (  HCC) 08/17/2015  . Left foot pain 07/04/2015  . Telangiectasia 07/04/2015  . Elevated glucose 05/04/2015  . Milia 05/04/2015  . Vitamin D deficiency 05/04/2015  . Anemia 04/27/2015  . Fatigue 04/27/2015  . Health care maintenance 04/27/2015  . History of MI (myocardial infarction) 04/27/2015  . Hot flashes 04/27/2015  . Low back pain radiating to both legs 03/10/2014  . Bilateral carotid artery disease (HCC) 02/17/2014  . First degree hemorrhoids 01/15/2014  . Gastroesophageal reflux disease without esophagitis 01/15/2014  .  Iron deficiency anemia 01/13/2014  . RBC microcytosis 01/13/2014  . CAD S/P LAD DES May 2015 12/17/2013  . HTN (hypertension) 06/25/2013  . High cholesterol 06/25/2013  . Chest pain with moderate risk of acute coronary syndrome 06/24/2013  . NSTEMI (non-ST elevated myocardial infarction) (HCC) 06/24/2013  . Myocardial infarction (HCC) 06/19/2013  . Tobacco abuse 06/10/2012  . Allergic rhinitis 06/10/2012  . ADD (attention deficit disorder) 03/06/2012  . Depressive disorder 04/12/2011    Past Surgical History:  Procedure Laterality Date  . ABDOMINAL HYSTERECTOMY    . CHOLECYSTECTOMY  08/2018  . COLON SURGERY     Flexible Sigmoidoscopy  . COLONOSCOPY N/A 01/08/2014   Procedure: COLONOSCOPY;  Surgeon: Theda Belfast, MD;  Location: [facility] ENDOSCOPY;  Service: Endoscopy;  Laterality: N/A;  . ESOPHAGOGASTRODUODENOSCOPY N/A 01/08/2014   Procedure: ESOPHAGOGASTRODUODENOSCOPY (EGD);  Surgeon: Theda Belfast, MD;  Location: [facility];  Service: Endoscopy;  Laterality: N/A;  . LEFT HEART CATHETERIZATION WITH CORONARY ANGIOGRAM N/A 06/24/2013   Procedure: LEFT HEART CATHETERIZATION WITH CORONARY ANGIOGRAM;  Surgeon: [name], MD;  Location: [facility] CATH LAB;  Service: Cardiovascular;  Laterality: N/A;  . LEFT HEART CATHETERIZATION WITH CORONARY ANGIOGRAM N/A 07/17/2013   Procedure: LEFT HEART CATHETERIZATION WITH CORONARY ANGIOGRAM;  Surgeon: Kathleene Hazel, MD;  Location: [facility] CATH LAB;  Service: Cardiovascular;  Laterality: N/A;  . TONSILECTOMY, ADENOIDECTOMY, BILATERAL MYRINGOTOMY AND TUBES    . WRIST SURGERY  1987   RIGHT; bone from forearm grafted to wrist     OB History    Gravida  5   Para  3   Term  2   Preterm  1   AB  2   Living  3     SAB  1   TAB  0   Ectopic  1   Multiple  0   Live Births              Family History  Problem Relation Age of Onset  . Cancer Mother   . COPD Mother        emphysema  . Heart disease Father 57       s/p 3V CABG  .  Depression Father   . Barrett's esophagus Father   . Mental illness Sister        depression  . Depression Sister   . Mental illness Son        ADHD, bipolar disorder  . Diabetes Maternal Grandmother   . Heart disease Maternal Grandmother   . Diabetes Maternal Grandfather   . Cancer Maternal Grandfather        lung cancer  . Diabetes Paternal Grandmother   . Kidney disease Paternal Grandmother   . Diabetes Paternal Grandfather   . Heart disease Paternal Grandfather     Social History   Tobacco Use  . Smoking status: Current Every Day Smoker    Packs/day: 1.50    Years: 20.00    Pack years: 30.00  Types: Cigarettes    Start date: 12/25/1982  . Smokeless tobacco: Never Used  . Tobacco comment: 09/03/19 < 1.5 ppd  Substance Use Topics  . Alcohol use: Yes    Alcohol/week: 24.0 standard drinks    Types: 24 Cans of beer per week    Comment: more since fall 2012, 09/03/19 few shots daily  . Drug use: No    Home Medications Prior to Admission medications   Medication Sig Start Date End Date Taking? Authorizing Provider  ALPRAZolam (XANAX) 0.25 MG tablet Take 1-2 tabs (0.25mg -0.50mg ) 30-60 minutes before procedure. May repeat if needed.Do not drive. 09/07/19   Anson Fret, MD  amphetamine-dextroamphetamine (ADDERALL) 15 MG tablet Take 0.5 tablets by mouth 2 (two) times daily. Patient taking differently: Take 15 mg by mouth 2 (two) times daily.  05/17/14   Porfirio Oar, PA  aspirin EC 81 MG tablet Take 81 mg by mouth daily.    [provider]  ezetimibe (ZETIA) 10 MG tablet Take 1 tablet (10 mg total) by mouth daily. 08/18/19 11/16/19  [name], MD  losartan (COZAAR) 50 MG tablet Take 1.5 tablets (75 mg total) by mouth daily. 08/18/19   [name], MD  methylPREDNISolone (MEDROL DOSEPAK) 4 MG TBPK tablet Take the pills all together daily with food for 6 days. Preferably in the morning. 09/03/19   Anson Fret, MD  metoprolol tartrate (LOPRESSOR) 25 MG  tablet Take 1 tablet (25 mg total) by mouth 2 (two) times daily. OFFICE VISIT NEEDED 08/05/18   [name], MD  nitroGLYCERIN (NITROSTAT) 0.4 MG SL tablet Place 1 tablet (0.4 mg total) under the tongue every 5 (five) minutes as needed for chest pain. 08/31/16   [name], MD  omeprazole (PRILOSEC) 20 MG capsule Take 20 mg by mouth 2 (two) times daily before a meal.    [provider]  ondansetron (ZOFRAN) 4 MG tablet Take 1 tablet (4 mg total) by mouth every 8 (eight) hours as needed for nausea or vomiting. 07/12/18   Fayrene Helper, PA-C  pilocarpine (SALAGEN) 5 MG tablet Take 5 mg by mouth 3 (three) times daily.  08/18/15   [provider]  pregabalin (LYRICA) 100 MG capsule Take 1 capsule (100 mg total) by mouth 3 (three) times daily. 09/14/19   Anson Fret, MD  promethazine (PHENERGAN) 25 MG tablet Take 1 tablet (25 mg total) by mouth every 6 (six) hours as needed for nausea or vomiting. 08/13/19   Mesner, Barbara Cower, MD  sucralfate (CARAFATE) 1 g tablet Take 1 g by mouth at bedtime. Patient not taking: Reported on 09/03/2019 08/11/19   [provider]  zolpidem (AMBIEN) 5 MG tablet Take 5 mg by mouth at bedtime.  12/07/14   [provider]    Allergies    Buprenorphine hcl, Doxycycline, Morphine, Morphine and related, and Ace inhibitors  Review of Systems   Review of Systems  Constitutional: Negative for chills and fever.  Respiratory: Negative for shortness of breath.   Cardiovascular: Negative for chest pain.  Gastrointestinal: Negative for abdominal pain, nausea and vomiting.  Neurological: Positive for weakness and numbness. Negative for headaches.  All other systems reviewed and are negative.   Physical Exam Updated Vital Signs BP 120/74 (BP Location: Left Arm)   Pulse 94   Temp 98.1 F (36.7 C) (Oral)   Resp 15   Ht 5\' 3"  (1.6 m)   Wt 56 kg   SpO2 97%   BMI 21.87 kg/m  Physical Exam Vitals and nursing note reviewed.    Constitutional:      General: She is not in acute distress.    Appearance: She is well-developed.  HENT:     Head: Normocephalic and atraumatic.  Eyes:     General:        Right eye: No discharge.        Left eye: No discharge.     Conjunctiva/sclera: Conjunctivae normal.  Neck:     Vascular: No JVD.     Trachea: No tracheal deviation.  Cardiovascular:     Rate and Rhythm: Normal rate and regular rhythm.  Pulmonary:     Effort: Pulmonary effort is normal.     Breath sounds: Normal breath sounds.  Abdominal:     General: Bowel sounds are normal. There is no distension.     Palpations: Abdomen is soft.     Tenderness: There is no abdominal tenderness. There is no guarding or rebound.  Musculoskeletal:     Cervical back: Neck supple.  Skin:    General: Skin is warm.     Findings: No erythema.  Neurological:     Mental Status: She is alert.     Comments: Mental Status:  Alert, thought content appropriate, able to give a coherent history. Speech fluent without evidence of aphasia. Able to follow 2 step commands without difficulty.  Cranial Nerves:  II:  Peripheral visual fields grossly normal, pupils equal, round, reactive to light III,IV, VI: ptosis not present, extra-ocular motions intact bilaterally  V,VII: smile symmetric, facial light touch sensation equal VIII: hearing grossly normal to voice  X: uvula elevates symmetrically  XI: bilateral shoulder shrug symmetric and strong XII: midline tongue extension without fassiculations Motor:  Normal tone. 5/5 strength of BUE and BLE major muscle groups including strong and equal grip strength and dorsiflexion/plantar flexion Sensory: Altered sensation to light touch of the bilateral lower extremities Cerebellar: Mild dysmetria with finger-to-nose of the bilateral upper extremities. Romberg sign present. Gait: Ambulates with somewhat unsteady gait, difficulty with heel walk and toe walk.  Psychiatric:        Behavior: Behavior  normal.     ED Results / Procedures / Treatments   Labs (all labs ordered are listed, but only abnormal results are displayed) Labs Reviewed  CBC WITH DIFFERENTIAL/PLATELET - Abnormal; Notable for the following components:      Result Value   MCV 105.8 (*)    MCH 35.0 (*)    All other components within normal limits  BASIC METABOLIC PANEL - Abnormal; Notable for the following components:   Potassium 3.4 (*)    Glucose, Bld 119 (*)    All other components within normal limits  SARS CORONAVIRUS 2 BY RT PCR (HOSPITAL ORDER, PERFORMED IN [facility])    EKG None  Radiology No results found.  Procedures Procedures (including critical care time)  Medications Ordered in ED Medications  Immune Globulin 10% (PRIVIGEN) IV infusion 20 g (has no administration in time range)  ketorolac (TORADOL) 30 MG/ML injection 30 mg (30 mg Intravenous Given 09/18/19 1011)  HYDROmorphone (DILAUDID) injection 0.5 mg (0.5 mg Intravenous Given 09/18/19 1141)  ondansetron (ZOFRAN) injection 4 mg (4 mg Intravenous Given 09/18/19 1142)    ED Course  I have reviewed the triage vital signs and the nursing notes.  Pertinent labs & imaging results that were available during my care of the patient were reviewed by me and considered in my medical decision making (see chart for  details).    MDM Rules/Calculators/A&P                          Patient presenting for evaluation of progressively worsening paresthesias and pain involving the extremities that was noted after receiving the second dose of her Covid vaccine on 08/12/2019.  Also notes worsening dexterity and increased falls.  Physical examination atraumatic.  She is afebrile, vital signs are stable.  She is tearful and appears to be in a great deal of distress relating to her symptoms.  She has been followed by Dr. [name] outpatient with no improvement in her symptoms after starting gabapentin, being switched to Lyrica and also trying a Medrol  Dosepak.  Lab work reviewed and interpreted by myself is overall reassuring only shows mild hypokalemia.  Will consult neurology for further recommendations.  In the meantime we will attempt to provide adequate analgesia.  CONSULT: Spoke with Dr. Otelia Limes with neurology who will evaluate the patient emergently in the ED.  CONSULT: Dr. Otelia Limes has evaluated the patient emergently in the ED and spoken with Dr. [name].  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  Plan for admission to the hospitalist service.   Spoke with Dr. Dairl Ponder with [facility] hospitalist service who agrees to assume care of patient and bring her into the hospital for further evaluation and management.  Patient had no change in symptoms with Toradol so will try a dose of Dilaudid with Zofran as she experiences nausea with narcotic pain medications.  Final Clinical Impression(s) / ED Diagnoses Final diagnoses:  Paresthesia and pain of both upper extremities  Paresthesia of both lower extremities    Rx / DC Orders ED Discharge Orders    None       Bennye Alm 09/18/19 1149    Cathren Laine, MD 09/18/19 1356

## 2019-09-18 NOTE — ED Notes (Signed)
Pt placed on cardiac monitor 

## 2019-09-18 NOTE — Consult Note (Signed)
NEURO HOSPITALIST CONSULT NOTE   Requestig physician: Dr. Dairl Ponder  Reason for Consult: Hand and foot paresthesias bilaterally, intermittent neuropathic pain and limb weakness x 4.   History obtained from:   Patient and Chart     HPI:                                                                                                                                          Lynn Harvey is an 54 y.o. female who presents to the ED for further evaluation of neuropathic limb pain and hand/foot paresthesias bilaterally since second mRNA-based Covid vaccine. She is followed by Dr. [name] at [facility].   Clinical course of her neurological deficits is summarized in Dr. Trevor Mace progress note from the patient's clinic visit on 7/15: "Lynn Harvey is a 54 y.o. female here as requested by [name], MD for gait abnormality.  Past medical history hot flashes, hypertension, bilateral carotid artery disease, mouth dryness, sicca syndrome, GERD, fatty liver, intractable vomiting with nausea, hyperlipidemia, elevated glucose, recurrent major depression, generalized anxiety disorder, edema, insomnia, Sjogren's, prediabetes.  She has an allergy to buprenorphine, morphine, ACE inhibitors and doxycycline.  I reviewed Dr. Blanca Friend notes that were provided which had no history absolutely of any symptoms in them.  She is a current every day smoker. Here with her husband who also provides much information. June 23rd she had her second covid shot and around 6-7pm that night her face became numb like sh ehad a shot of novacaine, the back of her head, then all over her body. She got up and she had no "maneuverability", started in the head and immediately went all the way to the feet. She went to the emergency roo, she was vomiting. They kept jer overnight, the next morning could walk and they let her go home and she wasn't throwing up anymore, she still had symptoms afterwards but 5 days later had an acute episode  again. She was in triage for 6 hours and by then she could walk again. The symptoms have not resolved, continuously worse, hands and deet are on fire, she can;t open jars. Her legs hurt at bedtime, she has lightning and shppting pain in the legs and the hands. The whole body is affected, husband says she will wake up crying.Marland Kitchen Her bowel movements are bad. She is also drinking daily, she is due for an abdominal ultrasound this month, she has a long history of alcohol use a 1/2 gallon every 2 days, but not as much lately she drinks beer and white claw. The pain is getting worse, She was given gabapentin for the nerve pain but it knocks her out."  On ROS, the patient's complaints are restricted to the above motor and sensory symptoms. She is not  complaining of additional deficits. She feels that she is gradually getting worse.   Past Medical History:  Diagnosis Date  . ADD (attention deficit disorder)   . Allergy   . Anemia   . Anxiety   . Coronary artery disease    a. s/p NSTEMI in 2015 with angioplasty alone to mid-LAD, repeat cath within the same month showing a patent site  . Depression   . Depression   . Fatty liver   . GAD (generalized anxiety disorder)   . GERD (gastroesophageal reflux disease)   . Gestational diabetes   . History of heart attack   . Hyperlipidemia   . Hypertension   . Insomnia   . Lupus (HCC)   . Milia   . Myocardial infarction (HCC) 06/2013  . Rectal abnormality   . Sicca syndrome (HCC)   . Sjogren's syndrome (HCC)   . Telangiectasia   . Vitamin D deficiency     Past Surgical History:  Procedure Laterality Date  . ABDOMINAL HYSTERECTOMY    . CHOLECYSTECTOMY  08/2018  . COLON SURGERY     Flexible Sigmoidoscopy  . COLONOSCOPY N/A 01/08/2014   Procedure: COLONOSCOPY;  Surgeon: Theda Belfast, MD;  Location: [facility];  Service: Endoscopy;  Laterality: N/A;  . ESOPHAGOGASTRODUODENOSCOPY N/A 01/08/2014   Procedure: ESOPHAGOGASTRODUODENOSCOPY (EGD);   Surgeon: Theda Belfast, MD;  Location: [facility];  Service: Endoscopy;  Laterality: N/A;  . LEFT HEART CATHETERIZATION WITH CORONARY ANGIOGRAM N/A 06/24/2013   Procedure: LEFT HEART CATHETERIZATION WITH CORONARY ANGIOGRAM;  Surgeon: [name], MD;  Location: [facility] CATH LAB;  Service: Cardiovascular;  Laterality: N/A;  . LEFT HEART CATHETERIZATION WITH CORONARY ANGIOGRAM N/A 07/17/2013   Procedure: LEFT HEART CATHETERIZATION WITH CORONARY ANGIOGRAM;  Surgeon: Kathleene Hazel, MD;  Location: [facility] CATH LAB;  Service: Cardiovascular;  Laterality: N/A;  . TONSILECTOMY, ADENOIDECTOMY, BILATERAL MYRINGOTOMY AND TUBES    . WRIST SURGERY  1987   RIGHT; bone from forearm grafted to wrist    Family History  Problem Relation Age of Onset  . Cancer Mother   . COPD Mother        emphysema  . Heart disease Father 51       s/p 3V CABG  . Depression Father   . Barrett's esophagus Father   . Mental illness Sister        depression  . Depression Sister   . Mental illness Son        ADHD, bipolar disorder  . Diabetes Maternal Grandmother   . Heart disease Maternal Grandmother   . Diabetes Maternal Grandfather   . Cancer Maternal Grandfather        lung cancer  . Diabetes Paternal Grandmother   . Kidney disease Paternal Grandmother   . Diabetes Paternal Grandfather   . Heart disease Paternal Grandfather              Social History:  reports that she has been smoking cigarettes. She started smoking about 36 years ago. She has a 30.00 pack-year smoking history. She has never used smokeless tobacco. She reports current alcohol use of about 24.0 standard drinks of alcohol per week. She reports that she does not use drugs.  Allergies  Allergen Reactions  . Buprenorphine Hcl Nausea And Vomiting  . Doxycycline Nausea Only  . Morphine Nausea And Vomiting  . Morphine And Related Nausea And Vomiting  . Ace Inhibitors Cough    HOME MEDICATIONS:  No current facility-administered medications on file prior to encounter.   Current Outpatient Medications on File Prior to Encounter  Medication Sig Dispense Refill  . amphetamine-dextroamphetamine (ADDERALL) 15 MG tablet Take 0.5 tablets by mouth 2 (two) times daily. (Patient taking differently: Take 15 mg by mouth 2 (two) times daily. ) 60 tablet 0  . aspirin EC 81 MG tablet Take 81 mg by mouth daily.    Marland Kitchen ezetimibe (ZETIA) 10 MG tablet Take 1 tablet (10 mg total) by mouth daily. 90 tablet 3  . losartan (COZAAR) 50 MG tablet Take 1.5 tablets (75 mg total) by mouth daily. 135 tablet 3  . metoprolol tartrate (LOPRESSOR) 25 MG tablet Take 1 tablet (25 mg total) by mouth 2 (two) times daily. OFFICE VISIT NEEDED 30 tablet 1  . nitroGLYCERIN (NITROSTAT) 0.4 MG SL tablet Place 1 tablet (0.4 mg total) under the tongue every 5 (five) minutes as needed for chest pain. 25 tablet 3  . omeprazole (PRILOSEC) 20 MG capsule Take 20 mg by mouth 2 (two) times daily before a meal.    . ondansetron (ZOFRAN) 4 MG tablet Take 1 tablet (4 mg total) by mouth every 8 (eight) hours as needed for nausea or vomiting. 12 tablet 0  . pilocarpine (SALAGEN) 5 MG tablet Take 5 mg by mouth 3 (three) times daily.     . pregabalin (LYRICA) 100 MG capsule Take 1 capsule (100 mg total) by mouth 3 (three) times daily. 90 capsule 2  . promethazine (PHENERGAN) 25 MG tablet Take 1 tablet (25 mg total) by mouth every 6 (six) hours as needed for nausea or vomiting. 30 tablet 0  . valACYclovir (VALTREX) 500 MG tablet Take 500 mg by mouth 3 (three) times daily as needed (fever blister).    Marland Kitchen zolpidem (AMBIEN) 5 MG tablet Take 5 mg by mouth at bedtime.     . ALPRAZolam (XANAX) 0.25 MG tablet Take 1-2 tabs (0.25mg -0.50mg ) 30-60 minutes before procedure. May repeat if needed.Do not drive. (Patient not taking: Reported on 09/18/2019) 4 tablet 0  . methylPREDNISolone (MEDROL  DOSEPAK) 4 MG TBPK tablet Take the pills all together daily with food for 6 days. Preferably in the morning. (Patient not taking: Reported on 09/18/2019) 21 tablet 1     ROS:                                                                                                                                       As per HPI.    Blood pressure (!) 157/84, pulse 84, temperature 98.1 F (36.7 C), temperature source Oral, resp. rate 16, height 5\' 3"  (1.6 m), weight 56 kg, SpO2 93 %.   General Examination:  Physical Exam  HEENT-  [place]/AT    Lungs- Respirations unlabored Extremities- No edema   Neurological Examination Mental Status: Alert, oriented, thought content appropriate.  Speech fluent without evidence of aphasia.  Able to follow all commands without difficulty. Cranial Nerves: II: Visual fields intact. No extinction to DSS.   III,IV, VI: no ptosis. EOMI. No nystagmus.  V,VII: Smile symmetric, facial temp sensation equal bilaterally VIII: Hearing intact to voice IX,X: No hoarseness or hypophonia XI: Symmetric XII: Midline tongue extension Motor: Giveway weakness in all 4 extremities, worse to lower extremities than upper extremities and somewhat worse on the right than on the left. Maximum strength elicited is 4+/5 bilateral upper and lower extremities. No objective asymmetry in the context of the asymmetry of effort and asymmetric giveway.  Sensory: Decreased temp and FT sensation to hands > wrists and distal forearms bilaterally. Absent temp sensation to feet, ankles and distal lower extremities below the knees, with decreased but present temp sensation more proximally that improves more proximally. Decreased FT sensation to feet bilaterally. Cold-induced paresthesias to feet bilaterally. Hyperalgesia to soles of feet bilaterally.  Deep Tendon Reflexes: 1+ bilateral brachioradialis and  biceps. 0 patellae and achilles bilaterally.  Plantars: Right: downgoing   Left: downgoing Cerebellar: No ataxia with FNF bilaterally. Fine action tremor is present bilaterally.  Gait: Deferred   Lab Results: Basic Metabolic Panel: Recent Labs  Lab 09/18/19 0559  NA 140  K 3.4*  CL 103  CO2 23  GLUCOSE 119*  BUN 7  CREATININE 0.88  CALCIUM 9.0    CBC: Recent Labs  Lab 09/18/19 0559  WBC 9.2  NEUTROABS 4.5  HGB 13.8  HCT 41.7  MCV 105.8*  PLT 246    Cardiac Enzymes: No results for input(s): CKTOTAL, CKMB, CKMBINDEX, TROPONINI in the last 168 hours.  Lipid Panel: No results for input(s): CHOL, TRIG, HDL, CHOLHDL, VLDL, LDLCALC in the last 168 hours.  Imaging: No results found.  Assessment/Recommendations: 54 year old female presenting with intermittent weakness on a background of distal > proximal sensory loss with paresthesias, also with a waxing/waning component, since her second mRNA-based Covid vaccine. 1. The patient's condition is unlikely to be secondary to GBS, given the overall clinical findings. Her BLE areflexia is most likely due to chronic EtOH abuse, given spared upper extremity reflexes. The waxing and waning nature of the symptoms would also be atypical for GBS and her LP showed no albuminocytologic dissociation. 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 Chronic neuropathy from long-term EtOH abuse may be a significant contributing factor, but would be less likely to present with acute worsening.  2. Recent MRI brain and cervical spine did not reveal a structural etiology for the patient's symptoms.  3. Outpatient electrophysiological studies have been scheduled for August.  4. IVIG 400 mg/kg qd x 5 days would be a relatively safe empiric treatment option. Benefits of proceeding with treatment prior to obtaining  electrophysiologic results are felt to outweigh risks, given that the patient feels that her condition is worsening and unavailability of NCS prior to the appointment date set for August. Orders have been placed.  5. The above was discussed with Dr. [name]. We are in consensus regarding the DDx and empiric treatment plan. Also discussed with the patient, including risks/benefits of IVIG. The patient expressed understanding and agreement with the plan.  6. Vitamin B12 normal at  581.Thiamine level normal at 74.  Electronically signed: Dr. Caryl Pina 09/18/2019, 4:11 PM

## 2019-09-18 NOTE — Evaluation (Signed)
Physical Therapy Evaluation Patient Details Name: Lynn Harvey MRN: [id number] DOB: July 27, 1965 Today's Date: 09/18/2019   History of Present Illness  Pt is a 54 y/o female admitted secondary to increased numbness and tingling in hands and feet. Thought to be secondary to neuropathy. Pt reports it began after getting her second covid vaccine. PMH includes CAD, NSTEMI, HTN, and Lupus.   Clinical Impression  Pt admitted secondary to problem above with deficits below. Required min to min guard A for mobility this session. Pt reports burning sensation in BLE during ambulation. Anticipate pt will progress well and will not require follow up PT at d/c. Will continue to follow acutely to maximize functional mobility independence and safety.     Follow Up Recommendations No PT follow up    Equipment Recommendations  Other (comment) (TBD)    Recommendations for Other Services   OT consult    Precautions / Restrictions Precautions Precautions: Fall Restrictions Weight Bearing Restrictions: No      Mobility  Bed Mobility Overal bed mobility: Modified Independent                Transfers Overall transfer level: Needs assistance Equipment used: 1 person hand held assist Transfers: Sit to/from Stand Sit to Stand: Min assist         General transfer comment: Min A for lift assist and steadying.   Ambulation/Gait Ambulation/Gait assistance: Min assist;Min guard Gait Distance (Feet): 50 Feet Assistive device: 1 person hand held assist Gait Pattern/deviations: Step-through pattern;Decreased stride length Gait velocity: Decreased   General Gait Details: Pt requiring min guard to min A for steadying. Pt reports burning sensation in feet throughout. Mild unsteadiness, however, no overt LOB.   Stairs            Wheelchair Mobility    Modified Rankin (Stroke Patients Only)       Balance Overall balance assessment: Needs assistance Sitting-balance support: No upper  extremity supported;Feet supported Sitting balance-Leahy Scale: Fair     Standing balance support: Single extremity supported;During functional activity Standing balance-Leahy Scale: Fair Standing balance comment: Able to maintain static standing without UE support                              Pertinent Vitals/Pain Pain Assessment: Faces Faces Pain Scale: Hurts even more Pain Location: bilateral feet and bilateral hands Pain Descriptors / Indicators: Burning Pain Intervention(s): Limited activity within patient's tolerance;Monitored during session;Repositioned    Home Living Family/patient expects to be discharged to:: Private residence Living Arrangements: Children;Spouse/significant other Available Help at Discharge: Family Type of Home: Mobile home Home Access: Stairs to enter Entrance Stairs-Rails: Can reach both;Left;Right Entrance Stairs-Number of Steps: 5 Home Layout: One level Home Equipment: Bedside commode      Prior Function Level of Independence: Independent               Hand Dominance        Extremity/Trunk Assessment   Upper Extremity Assessment Upper Extremity Assessment: Defer to OT evaluation    Lower Extremity Assessment Lower Extremity Assessment: RLE deficits/detail;LLE deficits/detail RLE Deficits / Details: Reports burning and tingling in bilateral feet LLE Deficits / Details: Reports burning and tingling in bilateral feet    Cervical / Trunk Assessment Cervical / Trunk Assessment: Normal  Communication   Communication: No difficulties  Cognition Arousal/Alertness: Awake/alert Behavior During Therapy: WFL for tasks assessed/performed Overall Cognitive Status: Within Functional Limits for tasks assessed  General Comments      Exercises     Assessment/Plan    PT Assessment Patient needs continued PT services  PT Problem List Decreased strength;Decreased  balance;Decreased activity tolerance;Decreased mobility;Decreased knowledge of precautions;Decreased knowledge of use of DME;Impaired sensation       PT Treatment Interventions DME instruction;Gait training;Stair training;Functional mobility training;Therapeutic activities;Therapeutic exercise;Balance training;Patient/family education    PT Goals (Current goals can be found in the Care Plan section)  Acute Rehab PT Goals Patient Stated Goal: to decrease pain  PT Goal Formulation: With patient Time For Goal Achievement: 10/02/19 Potential to Achieve Goals: Good    Frequency Min 3X/week   Barriers to discharge        Co-evaluation               AM-PAC PT "6 Clicks" Mobility  Outcome Measure Help needed turning from your back to your side while in a flat bed without using bedrails?: None Help needed moving from lying on your back to sitting on the side of a flat bed without using bedrails?: None Help needed moving to and from a bed to a chair (including a wheelchair)?: A Little Help needed standing up from a chair using your arms (e.g., wheelchair or bedside chair)?: A Little Help needed to walk in hospital room?: A Little Help needed climbing 3-5 steps with a railing? : A Lot 6 Click Score: 19    End of Session Equipment Utilized During Treatment: Gait belt Activity Tolerance: Patient tolerated treatment well Patient left: in bed;with call bell/phone within reach (on stretcher in ED ) Nurse Communication: Mobility status PT Visit Diagnosis: Unsteadiness on feet (R26.81);Muscle weakness (generalized) (M62.81);Other symptoms and signs involving the nervous system (Z60.109)    Time: 3235-5732 PT Time Calculation (min) (ACUTE ONLY): 10 min   Charges:   PT Evaluation $PT Eval Moderate Complexity: 1 Mod          Farley Ly, PT, DPT  Acute Rehabilitation Services  Pager: [phone] Office: [phone]   Lehman Prom 09/18/2019, 5:22 PM

## 2019-09-18 NOTE — ED Triage Notes (Signed)
Patient reports tingling/pain at feet ,legs and arms after receiving 2nd dose of Covid immunization last 08/12/19.

## 2019-09-19 LAB — BASIC METABOLIC PANEL
Anion gap: 11 (ref 5–15)
BUN: 15 mg/dL (ref 6–20)
CO2: 25 mmol/L (ref 22–32)
Calcium: 9 mg/dL (ref 8.9–10.3)
Chloride: 103 mmol/L (ref 98–111)
Creatinine, Ser: 1.13 mg/dL — ABNORMAL HIGH (ref 0.44–1.00)
GFR calc Af Amer: >60 mL/min (ref >60)
GFR calc non Af Amer: 56 mL/min — ABNORMAL LOW (ref >60)
Glucose, Bld: 70 mg/dL (ref 70–99)
Potassium: 4.2 mmol/L (ref 3.5–5.1)
Sodium: 139 mmol/L (ref 135–145)

## 2019-09-19 LAB — CBC
HCT: 37.1 % (ref 36.0–46.0)
Hemoglobin: 11.9 g/dL — ABNORMAL LOW (ref 12.0–15.0)
MCH: 33.3 pg (ref 26.0–34.0)
MCHC: 32.1 g/dL (ref 30.0–36.0)
MCV: 103.9 fL — ABNORMAL HIGH (ref 80.0–100.0)
Platelets: 136 10*3/uL — ABNORMAL LOW (ref 150–400)
RBC: 3.57 MIL/uL — ABNORMAL LOW (ref 3.87–5.11)
RDW: 13.2 % (ref 11.5–15.5)
WBC: 5.1 10*3/uL (ref 4.0–10.5)
nRBC: 0 % (ref 0.0–0.2)

## 2019-09-19 MED ORDER — GABAPENTIN 100 MG PO CAPS
100.0000 mg | ORAL_CAPSULE | Freq: Three times a day (TID) | ORAL | Status: DC
  Administered 2019-09-19 – 2019-09-20 (×3): 100 mg via ORAL
  Filled 2019-09-19 (×3): qty 1

## 2019-09-19 MED ORDER — ADULT MULTIVITAMIN W/MINERALS CH
1.0000 | ORAL_TABLET | Freq: Every day | ORAL | Status: DC
  Administered 2019-09-19 – 2019-09-21 (×3): 1 via ORAL
  Filled 2019-09-19 (×4): qty 1

## 2019-09-19 MED ORDER — ZOLPIDEM TARTRATE 5 MG PO TABS
5.0000 mg | ORAL_TABLET | Freq: Once | ORAL | Status: AC
  Administered 2019-09-19: 5 mg via ORAL
  Filled 2019-09-19: qty 1

## 2019-09-19 NOTE — Progress Notes (Addendum)
PROGRESS NOTE    Lynn Harvey  QPY:[id number] DOB: [birth year] DOA: 09/18/2019 PCP: [name], MD   Brief Narrative:  This is a 54 year old female with past medical history of CAD, chronic alcohol use/abuse, NSTEMI, GERD, hypertension, hyperlipidemia, tobacco use, Sjogren's syndrome, lupus who presented to the ED with progressively worsening paresthesias x1 month.  She developed numbness in her face which progressed to the rest of her body and noticed difficulty with ambulation and vomiting but appear to have initiated prior to the vaccine administration but patient indicates the symptoms continued to worsen after vaccine administration.  She went to the ED with improvement in symptoms without intervention and then 5 days later had an acute recurrence. She had some difficulty with ambulation at that time.  Also with progressively worsening pain in her hands and feet, described as feeling as though they are on fire.  Has had decreased dexterity and falling at home.  With leg weakness R>L.  She was initially seen by her rheumatologist who started her on gabapentin without improvement and saw Dr. [name], Neurolgy who initially saw her on 7/15 and started a steroid taper and Lyrica.  She had an LP which was reassuring and is scheduled for a nerve conduction test in August.  Currently her symptoms have been progressing and worsening and her Lyrica was increased on 7/25 however still no improvement. In ED patient found to be afebrile hemodynamically stable on room air.  Labs essentially unremarkable.  COVID-19 negative.  ED physician spoke with Dr. Otelia Limes, neurology who evaluated the patient in the ED followed by discussion with the patient's neurologist - plan to admit for close monitoring.  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 Neuro following - symptoms not consistent with GBS given areflexia/waxing-waning presentation Possibly due to chronic alcohol use/abuse LP per neuro not consistent with GBS - MRI unremarkable for structural etiology IVIG x 5 days empirically per neuro  Hypokalemia Previously repleted  Hypertension Continue home losartan and metoprolol  CAD s/p DES May 2015 Continue home aspirin and Zetia I do not see a statin on her med list Lipid panel remarkable for hypertriglyceridemia, low HDL elevated LDL and VLDL - continue to impress upon the patient need for statin therapy and dietary changes   DVT prophylaxis: SCDs only, patient refusing Lovenox Code Status: Full Family Communication: None present  Status is: Inpatient  Dispo: The patient is from: Home              Anticipated d/c is to: Home              Anticipated d/c date is: 96 hours              Patient currently not medically stable for discharge given ongoing need for IV Ig per neurology, close monitoring in the setting of atypical paresthesia symptoms as above  Consultants:   Neurology  Procedures:   LP 03/22/2019  Antimicrobials:  None indicated  Subjective: No acute issues or events overnight, hand and feet numbness burning ongoing but markedly improved from admission.  Denies nausea, vomiting, diarrhea, constipation, headache, fevers, chills.  Objective: Vitals:   09/18/19 2115 09/18/19 2306 09/19/19 0336 09/19/19 0727  BP: (!) 153/98 (!) 143/87 119/73 (!) 131/86  Pulse: 98 97 84 93  Resp: 18 17  18 17  Temp: 99.1 F (37.3 C) 98.4 F (36.9 C) 98 F (36.7 C) 98.2 F (36.8 C)  TempSrc: Oral Oral  Oral  SpO2: 100% 95% 91% 96%  Weight:      Height:        Intake/Output Summary (Last 24 hours) at 09/19/2019 0807 Last data filed at 09/18/2019 1904 Gross per 24 hour  Intake 785.43 ml  Output --  Net 785.43 ml   Filed Weights   09/18/19 0552  Weight: 56 kg     Examination:  General:  Pleasantly resting in bed, No acute distress. HEENT:  Normocephalic atraumatic.  Sclerae nonicteric, noninjected.  Extraocular movements intact bilaterally. Neck:  Without mass or deformity.  Trachea is midline. Lungs:  Clear to auscultate bilaterally without rhonchi, wheeze, or rales. Heart:  Regular rate and rhythm.  Without murmurs, rubs, or gallops. Abdomen:  Soft, nontender, nondistended.  Without guarding or rebound. Extremities: Without cyanosis, clubbing, edema, or obvious deformity. Vascular:  Dorsalis pedis and posterior tibial pulses palpable bilaterally. Skin:  Warm and dry, no erythema, no ulcerations.   Data Reviewed: I have personally reviewed following labs and imaging studies  CBC: Recent Labs  Lab 09/18/19 0559 09/19/19 0255  WBC 9.2 5.1  NEUTROABS 4.5  --   HGB 13.8 11.9*  HCT 41.7 37.1  MCV 105.8* 103.9*  PLT 246 136*   Basic Metabolic Panel: Recent Labs  Lab 09/18/19 0559 09/19/19 0255  NA 140  140 139  K 3.4*  3.4* 4.2  CL 103  103 103  CO2 23  23 25   GLUCOSE 119*  119* 70  BUN 7  7 15   CREATININE 0.88  0.88 1.13*  CALCIUM 9.0  9.0 9.0   GFR: Estimated Creatinine Clearance: 48.7 mL/min (A) (by C-G formula based on SCr of 1.13 mg/dL (H)). Liver Function Tests: No results for input(s): AST, ALT, ALKPHOS, BILITOT, PROT, ALBUMIN in the last 168 hours. No results for input(s): LIPASE, AMYLASE in the last 168 hours. No results for input(s): AMMONIA in the last 168 hours. Coagulation Profile: No results for input(s): INR, PROTIME in the last 168 hours. Cardiac Enzymes: No results for input(s): CKTOTAL, CKMB, CKMBINDEX, TROPONINI in the last 168 hours. BNP (last 3 results) No results for input(s): PROBNP in the last 8760 hours. HbA1C: No results for input(s): HGBA1C in the last 72 hours. CBG: No results for input(s): GLUCAP in the last 168 hours. Lipid Profile: Recent Labs    09/18/19 0559  CHOL 194  HDL  32*  LDLCALC 121*  TRIG 206*  CHOLHDL 6.1   Thyroid Function Tests: No results for input(s): TSH, T4TOTAL, FREET4, T3FREE, THYROIDAB in the last 72 hours. Anemia Panel: No results for input(s): VITAMINB12, FOLATE, FERRITIN, TIBC, IRON, RETICCTPCT in the last 72 hours. Sepsis Labs: No results for input(s): PROCALCITON, LATICACIDVEN in the last 168 hours.  Recent Results (from the past 240 hour(s))  SARS Coronavirus 2 by RT PCR (hospital order, performed in [facility] hospital lab) Nasopharyngeal Nasopharyngeal Swab     Status: None   Collection Time: 09/18/19 11:22 AM   Specimen: Nasopharyngeal Swab  Result Value Ref Range Status   SARS Coronavirus 2 NEGATIVE NEGATIVE Final    Comment: (NOTE) SARS-CoV-2 target nucleic acids are NOT DETECTED.  The SARS-CoV-2 RNA is generally detectable in upper and lower respiratory specimens during the acute phase of infection. The lowest concentration of SARS-CoV-2 viral copies this assay can detect is 250 copies / mL. A negative result  does not preclude SARS-CoV-2 infection and should not be used as the sole basis for treatment or other patient management decisions.  A negative result may occur with improper specimen collection / handling, submission of specimen other than nasopharyngeal swab, presence of viral mutation(s) within the areas targeted by this assay, and inadequate number of viral copies (<250 copies / mL). A negative result must be combined with clinical observations, patient history, and epidemiological information.  Fact Sheet for Patients:   BoilerBrush.com.cy  Fact Sheet for Healthcare Providers: https://pope.com/  This test is not yet approved or  cleared by the [place] FDA and has been authorized for detection and/or diagnosis of SARS-CoV-2 by FDA under an Emergency Use Authorization (EUA).  This EUA will remain in effect (meaning this test can be used) for the  duration of the COVID-19 declaration under Section 564(b)(1) of the Act, 21 U.S.C. section 360bbb-3(b)(1), unless the authorization is terminated or revoked sooner.  Performed at [facility], [address], [place], [place] [address]     Radiology Studies: No results found.  Scheduled Meds: . aspirin EC  81 mg Oral Daily  . enoxaparin (LOVENOX) injection  40 mg Subcutaneous Q24H  . ezetimibe  10 mg Oral Daily  . feeding supplement (ENSURE ENLIVE)  237 mL Oral BID BM  . losartan  75 mg Oral Daily  . metoprolol tartrate  25 mg Oral BID  . pantoprazole  40 mg Oral Daily  . pilocarpine  5 mg Oral TID  . pregabalin  100 mg Oral TID  . sucralfate  1 g Oral QHS   Continuous Infusions: . Immune Globulin 10% Stopped (09/18/19 1904)     LOS: 1 day   Time spent:  Azucena Fallen, DO [facility]  If 7PM-7AM, please contact night-coverage [email or web address]  09/19/2019, 8:07 AM

## 2019-09-19 NOTE — Evaluation (Signed)
Occupational Therapy Evaluation Patient Details Name: Lynn Harvey MRN: [id number] DOB: 1965-07-21 Today's Date: 09/19/2019    History of Present Illness Pt is a 54 y/o female admitted secondary to increased numbness and tingling in hands and feet. Thought to be secondary to neuropathy. Pt reports it began after getting her second covid vaccine. PMH includes CAD, NSTEMI, HTN, and Lupus,history of Sjogren's syndrom,long-term EtOH abuse   Clinical Impression   This 54 yo female admitted with above presents to acute OT with PLOF before August 12, 2019 of being totally independent with all basic and IADLs. Currently pt is min guard A when up on her feet at RW level and has burning,numbness,tingling in her hands and feet/LEs and thus has trouble with her balance and use of hands for FM activities. She will benefit from acute OT without need for follow up.     Follow Up Recommendations  No OT follow up;Supervision - Intermittent    Equipment Recommendations  Other (comment) (RW depending on progression before DC)       Precautions / Restrictions Precautions Precautions: Fall Restrictions Weight Bearing Restrictions: No      Mobility Bed Mobility Overal bed mobility: Independent                Transfers Overall transfer level: Needs assistance Equipment used: Rolling walker (2 wheeled) Transfers: Sit to/from [employer] Sit to Stand: Min guard Stand pivot transfers: Min guard       General transfer comment: Had her try RW from room to RN station (50 feet) and SPC from RN station back to room (50 feet), with patient stating she felt more stable with RW than SPC but still felt like she was wavering a little even with RW (and this was indeed happening)    Balance Overall balance assessment: Needs assistance;No apparent balance deficits (not formally assessed) Sitting-balance support: Feet supported;No upper extremity supported Sitting balance-Leahy Scale:  Good     Standing balance support: Single extremity supported Standing balance-Leahy Scale: Poor Standing balance comment: Able to maintain static standing without UE support but needs 1 UE A for dynamic tasks                           ADL either performed or assessed with clinical judgement   ADL Overall ADL's : Needs assistance/impaired Eating/Feeding: Sitting;Minimal assistance Eating/Feeding Details (indicate cue type and reason): for opening containers Grooming: Min guard;Standing   Upper Body Bathing: Set up;Sitting   Lower Body Bathing: Min guard;Sit to/from stand   Upper Body Dressing : Set up;Sitting   Lower Body Dressing: Min guard;Sit to/from stand   Toilet Transfer: Min guard;Ambulation;RW;Regular Social worker and Hygiene: Min guard;Sit to/from stand               Vision Patient Visual Report: No change from baseline              Pertinent Vitals/Pain Pain Assessment: Faces Faces Pain Scale: Hurts even more Pain Location: bilateral feet/LEs and bilateral hands Pain Descriptors / Indicators: Burning;Numbness;Tingling Pain Intervention(s): Limited activity within patient's tolerance;Monitored during session;Repositioned (gave her heat and ice packs to try)     Hand Dominance Right   Extremity/Trunk Assessment Upper Extremity Assessment Upper Extremity Assessment: RUE deficits/detail;LUE deficits/detail RUE Deficits / Details: 4/5 MMT but has trouble opening packages and containers LUE Deficits / Details: 4/5 MMT but has trouble opening packages and containers  Cognition Arousal/Alertness: Awake/alert Behavior During Therapy: WFL for tasks assessed/performed Overall Cognitive Status: Within Functional Limits for tasks assessed                                        Exercises Other Exercises Other Exercises: We spoke about desensitzation she could try with different texures  on her hands and legs/feet (she will have to determine how much pressure is best for her)        Home Living Family/patient expects to be discharged to:: Private residence Living Arrangements: Children;Spouse/significant other Available Help at Discharge: Family Type of Home: Mobile home Home Access: Stairs to enter Secretary/administrator of Steps: 5 Entrance Stairs-Rails: Can reach both;Left;Right Home Layout: One level     Bathroom Shower/Tub: Producer, television/film/video: Standard     Home Equipment: Bedside commode          Prior Functioning/Environment Level of Independence: Independent        Comments: Reports she has fallen 3 times as of recent (2 when she was carrying items in her arms)        OT Problem List: Impaired balance (sitting and/or standing);Impaired sensation;Pain      OT Treatment/Interventions: Self-care/ADL training;DME and/or AE instruction;Patient/family education;Balance training;Therapeutic exercise    OT Goals(Current goals can be found in the care plan section) Acute Rehab OT Goals Patient Stated Goal: to be over this by the time I leave the hospital OT Goal Formulation: With patient Time For Goal Achievement: 10/03/19 Potential to Achieve Goals: Good  OT Frequency: Min 2X/week              AM-PAC OT "6 Clicks" Daily Activity     Outcome Measure Help from another person eating meals?: A Little Help from another person taking care of personal grooming?: A Little Help from another person toileting, which includes using toliet, bedpan, or urinal?: A Little Help from another person bathing (including washing, rinsing, drying)?: A Little Help from another person to put on and taking off regular upper body clothing?: A Little Help from another person to put on and taking off regular lower body clothing?: A Little 6 Click Score: 18   End of Session Equipment Utilized During Treatment: Gait belt;Rolling walker [facility]) Nurse  Communication:  (NT: MD wants pt to walk more today, pt needs staff with her and use RW)  Activity Tolerance: Patient tolerated treatment well Patient left: in bed;with call bell/phone within reach;with bed alarm set  OT Visit Diagnosis: Unsteadiness on feet (R26.81);Other abnormalities of gait and mobility (R26.89);Other symptoms and signs involving the nervous system (R29.898);Pain Pain - Right/Left:  (Bil) Pain - part of body:  (hands and LEs)                Time: 6789-3810 OT Time Calculation (min): 30 min Charges:  OT General Charges $OT Visit: 1 Visit OT Evaluation $OT Eval Moderate Complexity: 1 Mod OT Treatments $Self Care/Home Management : 8-22 mins  Ignacia Palma, OTR/L Acute [employer] Pager [phone] Office [phone]     Evette Georges 09/19/2019, 10:51 AM

## 2019-09-19 NOTE — Progress Notes (Signed)
Occupational Therapy Treatment Patient Details Name: Lynn Harvey MRN: [id number] DOB: August 18, 1965 Today's Date: 09/19/2019    History of present illness Pt is a 54 y/o female admitted secondary to increased numbness and tingling in hands and feet. Thought to be secondary to neuropathy. Pt reports it began after getting her second covid vaccine. PMH includes CAD, NSTEMI, HTN, and Lupus,history of Sjogren's syndrom,long-term EtOH abuse   OT comments  Patient seen to see how texture had felt to her hands as well as theraputty activities for hands to see if this would help with the burning/tingling sensations in her hands. Pt able to complete theraputty sequence and recommended that she do this 5 times a day unless it exacerbates the feelings in her hands and then she should stop.  Follow Up Recommendations  No OT follow up;Supervision - Intermittent    Equipment Recommendations  Other (comment) (may need RW (depends on progress))       Precautions / Restrictions Precautions Precautions: Fall Restrictions Weight Bearing Restrictions: No                     Exercises Other Exercises Other Exercises: Pt given theraputty and sequencing handout for theraputty to work with her hands to see if this would help with burning/tingling sensation in her hands. Pt went through the entire sequence. Asked pt how using socks on her hand (with grippers) felt when she rubbed it on other hand--"did not feel good, better with lighter rubbing than deep pressure rubbing"            Frequency  Min 2X/week        Progress Toward Goals  OT Goals(current goals can now be found in the care plan section)  Progress towards OT goals: Progressing toward goals     Plan Discharge plan remains appropriate       AM-PAC OT "6 Clicks" Daily Activity     Outcome Measure   Help from another person eating meals?: A Little Help from another person taking care of personal grooming?: A Little Help  from another person toileting, which includes using toliet, bedpan, or urinal?: A Little Help from another person bathing (including washing, rinsing, drying)?: A Little Help from another person to put on and taking off regular upper body clothing?: A Little Help from another person to put on and taking off regular lower body clothing?: A Little 6 Click Score: 18    End of Session    OT Visit Diagnosis: Unsteadiness on feet (R26.81);Other abnormalities of gait and mobility (R26.89);Other symptoms and signs involving the nervous system (R29.898);Pain Pain - Right/Left:  (bil) Pain - part of body:  (hands; feet/lower legs)   Activity Tolerance Patient tolerated treatment well   Patient Left  (sitting EOB)       Time: 4259-5638 OT Time Calculation (min): 14 min  Charges: OT General Charges $OT Visit: 1 Visit OT Treatments $Therapeutic Activity: 23-37 mins  Ignacia Palma, OTR/L Acute [employer] Pager [phone] Office [phone]      Evette Georges 09/19/2019, 5:17 PM

## 2019-09-19 NOTE — Progress Notes (Signed)
Initial Nutrition Assessment  RD working remotely.  DOCUMENTATION CODES:   Not applicable  INTERVENTION:   - Continue Ensure Enlive po BID, each supplement provides 350 kcal and 20 grams of protein  - MVI with minerals daily  - Agree with Regular diet order  NUTRITION DIAGNOSIS:   Increased nutrient needs related to chronic illness as evidenced by estimated needs.  GOAL:   Patient will meet greater than or equal to 90% of their needs  MONITOR:   PO intake, Supplement acceptance, Labs, Weight trends  REASON FOR ASSESSMENT:   Malnutrition Screening Tool    ASSESSMENT:   54 year old female who presented on 7/30 with leg pain, paresthesias x 1 month following second dose of COVID vaccine. PMH of CAD, EtOH abuse, tobacco abuse, NSTEMI in 2015, GERD, HTN, HLD, Sjogren's syndrome, lupus.   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  Pt is on a Regular diet with no meal completions documented at this time.  Spoke with pt via phone call to room. Interview was limited as pt had difficulty hearing RD due to issues with the phone.  Pt reports having a "normal" appetite for her. Pt states that she was on the Heart Healthy diet this morning and didn't like the food so she was able to get her diet changed to Regular. Pt states that her husband was able to bring in some [employer] for lunch.  Pt reports that over the last year, she has been having "stomach issues" and that her PO intake has been less than what it used to be. She reports still eating 3 times a day but that portions have been smaller.  Pt endorses weight loss from the 140 lb range down to 117 lb. She notes that her arms and legs look smaller and believes she may have lost some muscle mass. RD discussed the importance of adequate protein intake in maintaining lean muscle mass and preventing additional weight loss.  Pt states that she had the Ensure Enlive this morning and "it was  okay." Will continue with current supplement regimen. Will also order MVI with minerals daily.  Reviewed weight history in chart. Pt with an overall 7.5 kg weight loss since 07/12/18. This is an 11.8% weight loss which is not significant for timeframe but is concerning given pt's reports of decreased PO intake over the last year.  Medications reviewed and include: Ensure Enlive BID, protonix, IVIG  Labs reviewed: HDL 32, LDL 121, TG 206   NUTRITION - FOCUSED PHYSICAL EXAM:  Unable to complete at this time. RD working remotely.  Diet Order:   Diet Order            Diet regular Room service appropriate? Yes; Fluid consistency: Thin  Diet effective now                 EDUCATION NEEDS:   Education needs have been addressed  Skin:  Skin Assessment: Reviewed RN Assessment  Last BM:  no documented BM  Height:   Ht Readings from Last 1 Encounters:  09/18/19 5\' 3"  (1.6 m)    Weight:   Wt Readings from Last 1 Encounters:  09/18/19 56 kg    Ideal Body Weight:  52.3 kg  BMI:  Body mass index is 21.87 kg/m.  Estimated Nutritional Needs:   Kcal:  1700-1900  Protein:  80-95 grams  Fluid:  1.7-1.9 L    09/20/19, MS, RD, LDN Inpatient Clinical Dietitian Please see AMiON for  contact information.

## 2019-09-20 LAB — COMPREHENSIVE METABOLIC PANEL
ALT: 29 U/L (ref 0–44)
AST: 54 U/L — ABNORMAL HIGH (ref 15–41)
Albumin: 2.5 g/dL — ABNORMAL LOW (ref 3.5–5.0)
Alkaline Phosphatase: 117 U/L (ref 38–126)
Anion gap: 8 (ref 5–15)
BUN: 21 mg/dL — ABNORMAL HIGH (ref 6–20)
CO2: 27 mmol/L (ref 22–32)
Calcium: 8.8 mg/dL — ABNORMAL LOW (ref 8.9–10.3)
Chloride: 103 mmol/L (ref 98–111)
Creatinine, Ser: 1.5 mg/dL — ABNORMAL HIGH (ref 0.44–1.00)
GFR calc Af Amer: 46 mL/min — ABNORMAL LOW (ref >60)
GFR calc non Af Amer: 40 mL/min — ABNORMAL LOW (ref >60)
Glucose, Bld: 132 mg/dL — ABNORMAL HIGH (ref 70–99)
Potassium: 3.9 mmol/L (ref 3.5–5.1)
Sodium: 138 mmol/L (ref 135–145)
Total Bilirubin: 0.7 mg/dL (ref 0.3–1.2)
Total Protein: 6.5 g/dL (ref 6.5–8.1)

## 2019-09-20 LAB — CBC
HCT: 33.9 % — ABNORMAL LOW (ref 36.0–46.0)
Hemoglobin: 11.4 g/dL — ABNORMAL LOW (ref 12.0–15.0)
MCH: 34.5 pg — ABNORMAL HIGH (ref 26.0–34.0)
MCHC: 33.6 g/dL (ref 30.0–36.0)
MCV: 102.7 fL — ABNORMAL HIGH (ref 80.0–100.0)
Platelets: 123 10*3/uL — ABNORMAL LOW (ref 150–400)
RBC: 3.3 MIL/uL — ABNORMAL LOW (ref 3.87–5.11)
RDW: 13 % (ref 11.5–15.5)
WBC: 4.1 10*3/uL (ref 4.0–10.5)
nRBC: 0 % (ref 0.0–0.2)

## 2019-09-20 MED ORDER — ROPINIROLE HCL 1 MG PO TABS
0.5000 mg | ORAL_TABLET | Freq: Every day | ORAL | Status: DC
  Administered 2019-09-20 – 2019-09-21 (×2): 0.5 mg via ORAL
  Filled 2019-09-20 (×2): qty 1

## 2019-09-20 MED ORDER — TRAMADOL HCL 50 MG PO TABS
50.0000 mg | ORAL_TABLET | Freq: Once | ORAL | Status: AC
  Administered 2019-09-20: 50 mg via ORAL
  Filled 2019-09-20: qty 1

## 2019-09-20 MED ORDER — GABAPENTIN 300 MG PO CAPS
300.0000 mg | ORAL_CAPSULE | Freq: Three times a day (TID) | ORAL | Status: DC
  Administered 2019-09-20 – 2019-09-22 (×7): 300 mg via ORAL
  Filled 2019-09-20 (×7): qty 1

## 2019-09-20 MED ORDER — ZOLPIDEM TARTRATE 5 MG PO TABS
5.0000 mg | ORAL_TABLET | Freq: Every day | ORAL | Status: DC
  Administered 2019-09-20 – 2019-09-21 (×2): 5 mg via ORAL
  Filled 2019-09-20 (×2): qty 1

## 2019-09-20 NOTE — Progress Notes (Addendum)
NEUROLOGY PROGRESS NOTE   Subjective: Patient states that she does not feel any improvement at this time.  Still having paresthesias of bilateral hands and some burning.  She also states that she is having burning of her feet and electrical shocklike sensations that start in her feet and go up her leg at times.  Exam: Vitals:   09/20/19 0412 09/20/19 0826  BP: 102/69 (!) 153/93  Pulse: 84 86  Resp: 17 18  Temp: 97.7 F (36.5 C) 97.8 F (36.6 C)  SpO2: 98% 100%    Physical Exam  Constitutional: Appears well-developed and well-nourished.  Psych: Affect appropriate to situation Eyes: No scleral injection HENT: No OP obstrucion Head: Normocephalic.  Cardiovascular: Palpable Respiratory: Effort normal, non-labored breathing GI: Soft.  No distension. There is no tenderness.  Skin: WDI   Neuro:  Mental Status: Alert, oriented, thought content appropriate.  Speech fluent without evidence of aphasia.  Able to follow 3 step commands without difficulty. Cranial Nerves: II:  Visual fields grossly normal,  III,IV, VI: ptosis not present, extra-ocular motions intact bilaterally, pupils equal, round, reactive to light  V,VII: smile symmetric, facial light touch sensation normal bilaterally VIII: hearing normal bilaterally Motor: Right : Upper extremity   5/5    Left:     Upper extremity   5/5  Lower extremity   5/5     Lower extremity   5/5 Sensory: Intact throughout however as noted above she is having paresthesias of bilateral hands and paresthesias bilateral legs from feet up to knees.  She is also getting shocklike feelings up her legs intermittently.  As noted in prior exam she still has absent temperature sensation to feet, ankles and distal lower extremities below the knees.  She does have decreased but preserved temperature sensation more distally that improves in a distal-proximal gradient.  She still has hyperalgesia to the soles of bilateral feet. Deep Tendon Reflexes: 1+  bilateral brachioradialis; 0 patellae and Achilles bilaterally. Plantars: Right: downgoing   Left: downgoing Cerebellar: normal finger-to-nose  and normal heel-to-shin test    Medications:  Scheduled: . aspirin EC  81 mg Oral Daily  . enoxaparin (LOVENOX) injection  40 mg Subcutaneous Q24H  . ezetimibe  10 mg Oral Daily  . feeding supplement (ENSURE ENLIVE)  237 mL Oral BID BM  . gabapentin  300 mg Oral TID  . losartan  75 mg Oral Daily  . metoprolol tartrate  25 mg Oral BID  . multivitamin with minerals  1 tablet Oral Daily  . pantoprazole  40 mg Oral Daily  . pilocarpine  5 mg Oral TID  . sucralfate  1 g Oral QHS  . zolpidem  5 mg Oral QHS   Continuous: . Immune Globulin 10% 20 g (09/19/19 1235)   OJJ:[id number] **OR** acetaminophen, ketorolac, metoprolol tartrate, nitroGLYCERIN, ondansetron, polyethylene glycol  Pertinent Labs/Diagnostics: None  Felicie Morn PA-C [facility] [phone]  Assessment/Recommendations: 54 year old female presenting with intermittent weakness on a background of distal > proximal sensory loss and paresthesias, also with a waxing/waning component, since her second mRNA-based Covid vaccine. 1. The patient's condition is unlikely to be secondary to GBS, given the overall clinical findings. Her BLE areflexia is most likely due to chronic EtOH abuse, given spared upper extremity reflexes. The waxing and waning nature of the symptoms would also be atypical for GBS and her LP showed no albuminocytologic dissociation. However, given her history of Sjogren's syndrome, an autoimmune motor and sensory polyneuropathy triggered by vaccination is a significant  differential diagnostic consideration. Chronic neuropathy from long-term EtOH abuse may be a significant contributing factor, but would be less likely to present with acute worsening.  2. Recent MRI brain and cervical spine did not reveal a structural etiology for the patient's symptoms.  3.  Outpatient electrophysiological studies have been scheduled for August.  4. IVIG 400 mg/kg qd x 5 days would be a relatively safe empiric treatment option. Benefits of proceeding with treatment prior to obtaining electrophysiologic results are felt to outweigh risks, given that the patient feels that her condition is worsening and unavailability of NCS prior to the appointment date set for August. Today will be day 3 of IVIG 5. Vitamin B12 normal at  581.Thiamine level normal at 74.  Electronically signed: Dr. Caryl Pina 09/20/2019, 11:21 AM

## 2019-09-20 NOTE — Progress Notes (Signed)
PROGRESS NOTE    Lynn Harvey  JIR:[id number] DOB: Oct 13, 1965 DOA: 09/18/2019 PCP: [name], MD   Brief Narrative:  This is a 54 year old female with past medical history of CAD, chronic alcohol use/abuse, NSTEMI, GERD, hypertension, hyperlipidemia, tobacco use, Sjogren's syndrome, lupus who presented to the ED with progressively worsening paresthesias x1 month.  She developed numbness in her face which progressed to the rest of her body and noticed difficulty with ambulation and vomiting but appear to have initiated prior to the vaccine administration but patient indicates the symptoms continued to worsen after vaccine administration.  She went to the ED with improvement in symptoms without intervention and then 5 days later had an acute recurrence. She had some difficulty with ambulation at that time.  Also with progressively worsening pain in her hands and feet, described as feeling as though they are on fire.  Has had decreased dexterity and falling at home.  With leg weakness R>L.  She was initially seen by her rheumatologist who started her on gabapentin without improvement and saw Dr. [name], Neurolgy who initially saw her on 7/15 and started a steroid taper and Lyrica.  She had an LP which was reassuring and is scheduled for a nerve conduction test in August.  Currently her symptoms have been progressing and worsening and her Lyrica was increased on 7/25 however still no improvement. In ED patient found to be afebrile hemodynamically stable on room air.  Labs essentially unremarkable.  COVID-19 negative.  ED physician spoke with Dr. Otelia Limes, neurology who evaluated the patient in the ED followed by discussion with the patient's neurologist - plan to admit for close monitoring.  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 - Neuro following - symptoms not consistent with GBS given areflexia/waxing-waning presentation - Possibly due to chronic alcohol use/abuse - LP per neuro not consistent with GBS - MRI unremarkable for structural etiology IVIG x 5 days empirically per neuro  AKI without history of CKD -Likely in the setting of poor p.o. intake, encouraged p.o. intake today, free water, liberalize diet -Follow morning labs  Leg pain/burning, unspecified, acute on chronic, POA Rule out restless leg syndrome/diffuse neuropathy -Patient on increasing doses of gabapentin today -We will attempt ropinirole tonight -Previously placed on multivitamin and banana bag to rule out deficiency as cause  Hypokalemia Previously repleted  Hypertension Continue home losartan and metoprolol  CAD s/p DES May 2015 Continue home aspirin and Zetia I do not see a statin on her med list Lipid panel remarkable for hypertriglyceridemia, low HDL elevated LDL and VLDL - continue to impress upon the patient need for statin therapy and dietary changes   DVT prophylaxis: SCDs only, patient refusing Lovenox Code Status: Full Family Communication: None present  Status is: Inpatient  Dispo: The patient is from: Home              Anticipated d/c is to: Home              Anticipated d/c date is: 96 hours              Patient currently not medically stable for discharge given ongoing need for IV Ig per neurology, close monitoring in the setting of atypical paresthesia symptoms as above  Consultants:   Neurology  Procedures:  LP 03/22/2019  Antimicrobials:  None indicated  Subjective: Patient continues to call out in pain overnight requesting narcotics which we discussed was not appropriate for neuropathic pain.  She otherwise denies nausea, vomiting, diarrhea, constipation, headache, fevers, chills.  Objective: Vitals:   09/19/19 1614 09/19/19 1945 09/19/19 2315 09/20/19 0412    BP: (!) 144/83 (!) 151/95 121/81 102/69  Pulse: 82 86 92 84  Resp: 20 17 17 17   Temp: 98.3 F (36.8 C) (!) 97.5 F (36.4 C) 98.3 F (36.8 C) 97.7 F (36.5 C)  TempSrc: Oral Oral Oral Oral  SpO2: 100% 99% 97% 98%  Weight:      Height:       No intake or output data in the 24 hours ending 09/20/19 0813 Filed Weights   09/18/19 0552  Weight: 56 kg    Examination:  General:  Pleasantly resting in bed, No acute distress. HEENT:  Normocephalic atraumatic.  Sclerae nonicteric, noninjected.  Extraocular movements intact bilaterally. Neck:  Without mass or deformity.  Trachea is midline. Lungs:  Clear to auscultate bilaterally without rhonchi, wheeze, or rales. Heart:  Regular rate and rhythm.  Without murmurs, rubs, or gallops. Abdomen:  Soft, nontender, nondistended.  Without guarding or rebound. Extremities: Without cyanosis, clubbing, edema, or obvious deformity. Vascular:  Dorsalis pedis and posterior tibial pulses palpable bilaterally. Skin:  Warm and dry, no erythema, no ulcerations.   Data Reviewed: I have personally reviewed following labs and imaging studies  CBC: Recent Labs  Lab 09/18/19 0559 09/19/19 0255 09/20/19 0220  WBC 9.2 5.1 4.1  NEUTROABS 4.5  --   --   HGB 13.8 11.9* 11.4*  HCT 41.7 37.1 33.9*  MCV 105.8* 103.9* 102.7*  PLT 246 136* 123*   Basic Metabolic Panel: Recent Labs  Lab 09/18/19 0559 09/19/19 0255 09/20/19 0220  NA 140  140 139 138  K 3.4*  3.4* 4.2 3.9  CL 103  103 103 103  CO2 23  23 25 27   GLUCOSE 119*  119* 70 132*  BUN 7  7 15  21*  CREATININE 0.88  0.88 1.13* 1.50*  CALCIUM 9.0  9.0 9.0 8.8*   GFR: Estimated Creatinine Clearance: 36.7 mL/min (A) (by C-G formula based on SCr of 1.5 mg/dL (H)). Liver Function Tests: Recent Labs  Lab 09/20/19 0220  AST 54*  ALT 29  ALKPHOS 117  BILITOT 0.7  PROT 6.5  ALBUMIN 2.5*   No results for input(s): LIPASE, AMYLASE in the last 168 hours. No results for input(s):  AMMONIA in the last 168 hours. Coagulation Profile: No results for input(s): INR, PROTIME in the last 168 hours. Cardiac Enzymes: No results for input(s): CKTOTAL, CKMB, CKMBINDEX, TROPONINI in the last 168 hours. BNP (last 3 results) No results for input(s): PROBNP in the last 8760 hours. HbA1C: No results for input(s): HGBA1C in the last 72 hours. CBG: No results for input(s): GLUCAP in the last 168 hours. Lipid Profile: Recent Labs    09/18/19 0559  CHOL 194  HDL 32*  LDLCALC 121*  TRIG 206*  CHOLHDL 6.1   Thyroid Function Tests: No results for input(s): TSH, T4TOTAL, FREET4, T3FREE, THYROIDAB in the last 72 hours. Anemia Panel: No results for input(s): VITAMINB12, FOLATE, FERRITIN, TIBC, IRON, RETICCTPCT in the last 72 hours. Sepsis Labs: No results for input(s): PROCALCITON, LATICACIDVEN in the last 168 hours.  Recent Results (from the past 240 hour(s))  SARS Coronavirus 2 by RT PCR (hospital order, performed in [facility] hospital lab) Nasopharyngeal Nasopharyngeal  Swab     Status: None   Collection Time: 09/18/19 11:22 AM   Specimen: Nasopharyngeal Swab  Result Value Ref Range Status   SARS Coronavirus 2 NEGATIVE NEGATIVE Final    Comment: (NOTE) SARS-CoV-2 target nucleic acids are NOT DETECTED.  The SARS-CoV-2 RNA is generally detectable in upper and lower respiratory specimens during the acute phase of infection. The lowest concentration of SARS-CoV-2 viral copies this assay can detect is 250 copies / mL. A negative result does not preclude SARS-CoV-2 infection and should not be used as the sole basis for treatment or other patient management decisions.  A negative result may occur with improper specimen collection / handling, submission of specimen other than nasopharyngeal swab, presence of viral mutation(s) within the areas targeted by this assay, and inadequate number of viral copies (<250 copies / mL). A negative result must be combined with  clinical observations, patient history, and epidemiological information.  Fact Sheet for Patients:   BoilerBrush.com.cy  Fact Sheet for Healthcare Providers: https://pope.com/  This test is not yet approved or  cleared by the [place] FDA and has been authorized for detection and/or diagnosis of SARS-CoV-2 by FDA under an Emergency Use Authorization (EUA).  This EUA will remain in effect (meaning this test can be used) for the duration of the COVID-19 declaration under Section 564(b)(1) of the Act, 21 U.S.C. section 360bbb-3(b)(1), unless the authorization is terminated or revoked sooner.  Performed at [facility], [address], [place], [place] [address]     Radiology Studies: No results found.  Scheduled Meds: . aspirin EC  81 mg Oral Daily  . enoxaparin (LOVENOX) injection  40 mg Subcutaneous Q24H  . ezetimibe  10 mg Oral Daily  . feeding supplement (ENSURE ENLIVE)  237 mL Oral BID BM  . gabapentin  100 mg Oral TID  . losartan  75 mg Oral Daily  . metoprolol tartrate  25 mg Oral BID  . multivitamin with minerals  1 tablet Oral Daily  . pantoprazole  40 mg Oral Daily  . pilocarpine  5 mg Oral TID  . sucralfate  1 g Oral QHS   Continuous Infusions: . Immune Globulin 10% 20 g (09/19/19 1235)     LOS: 2 days   Time spent:  Azucena Fallen, DO [facility]  If 7PM-7AM, please contact night-coverage [email or web address]  09/20/2019, 8:13 AM

## 2019-09-21 LAB — COMPREHENSIVE METABOLIC PANEL WITH GFR
ALT: 26 U/L (ref 0–44)
AST: 48 U/L — ABNORMAL HIGH (ref 15–41)
Albumin: 2.6 g/dL — ABNORMAL LOW (ref 3.5–5.0)
Alkaline Phosphatase: 109 U/L (ref 38–126)
Anion gap: 8 (ref 5–15)
BUN: 17 mg/dL (ref 6–20)
CO2: 25 mmol/L (ref 22–32)
Calcium: 8.8 mg/dL — ABNORMAL LOW (ref 8.9–10.3)
Chloride: 103 mmol/L (ref 98–111)
Creatinine, Ser: 0.89 mg/dL (ref 0.44–1.00)
GFR calc Af Amer: >60 mL/min
GFR calc non Af Amer: >60 mL/min
Glucose, Bld: 89 mg/dL (ref 70–99)
Potassium: 4.2 mmol/L (ref 3.5–5.1)
Sodium: 136 mmol/L (ref 135–145)
Total Bilirubin: 0.9 mg/dL (ref 0.3–1.2)
Total Protein: 6.9 g/dL (ref 6.5–8.1)

## 2019-09-21 LAB — CBC
HCT: 34.1 % — ABNORMAL LOW (ref 36.0–46.0)
Hemoglobin: 11.7 g/dL — ABNORMAL LOW (ref 12.0–15.0)
MCH: 35 pg — ABNORMAL HIGH (ref 26.0–34.0)
MCHC: 34.3 g/dL (ref 30.0–36.0)
MCV: 102.1 fL — ABNORMAL HIGH (ref 80.0–100.0)
Platelets: 121 10*3/uL — ABNORMAL LOW (ref 150–400)
RBC: 3.34 MIL/uL — ABNORMAL LOW (ref 3.87–5.11)
RDW: 13.1 % (ref 11.5–15.5)
WBC: 4.1 10*3/uL (ref 4.0–10.5)
nRBC: 0 % (ref 0.0–0.2)

## 2019-09-21 MED ORDER — DIPHENHYDRAMINE HCL 25 MG PO CAPS
25.0000 mg | ORAL_CAPSULE | Freq: Every evening | ORAL | Status: DC | PRN
  Administered 2019-09-22: 25 mg via ORAL
  Filled 2019-09-21: qty 1

## 2019-09-21 MED ORDER — RAMELTEON 8 MG PO TABS
8.0000 mg | ORAL_TABLET | Freq: Once | ORAL | Status: AC | PRN
  Administered 2019-09-21: 8 mg via ORAL
  Filled 2019-09-21: qty 1

## 2019-09-21 NOTE — Plan of Care (Signed)
  Problem: Education: Goal: Knowledge of General Education information will improve Description: Including pain rating scale, medication(s)/side effects and non-pharmacologic comfort measures Outcome: Progressing   Problem: Activity: Goal: Risk for activity intolerance will decrease Outcome: Progressing   Problem: Skin Integrity: Goal: Risk for impaired skin integrity will decrease Outcome: Progressing   

## 2019-09-21 NOTE — Plan of Care (Signed)
Pt alert and oriented. Pt on RA. Pt c/o not being able to sleep or pain being relieved to sleep. New PRN added. Heating packs utilized.  Problem: Education: Goal: Knowledge of General Education information will improve Description: Including pain rating scale, medication(s)/side effects and non-pharmacologic comfort measures Outcome: Progressing   Problem: Health Behavior/Discharge Planning: Goal: Ability to manage health-related needs will improve Outcome: Progressing   Problem: Clinical Measurements: Goal: Ability to maintain clinical measurements within normal limits will improve Outcome: Progressing Goal: Will remain free from infection Outcome: Progressing Goal: Diagnostic test results will improve Outcome: Progressing Goal: Respiratory complications will improve Outcome: Progressing Goal: Cardiovascular complication will be avoided Outcome: Progressing   Problem: Activity: Goal: Risk for activity intolerance will decrease Outcome: Progressing   Problem: Nutrition: Goal: Adequate nutrition will be maintained Outcome: Progressing   Problem: Coping: Goal: Level of anxiety will decrease Outcome: Progressing   Problem: Elimination: Goal: Will not experience complications related to bowel motility Outcome: Progressing Goal: Will not experience complications related to urinary retention Outcome: Progressing   Problem: Pain Managment: Goal: General experience of comfort will improve Outcome: Progressing   Problem: Safety: Goal: Ability to remain free from injury will improve Outcome: Progressing   Problem: Skin Integrity: Goal: Risk for impaired skin integrity will decrease Outcome: Progressing

## 2019-09-21 NOTE — Progress Notes (Signed)
PROGRESS NOTE    GRANT HENKES  MBT:[id number] DOB: May 03, 1965 DOA: 09/18/2019 PCP: [name], MD   Brief Narrative:  This is a 54 year old female with past medical history of CAD, chronic alcohol use/abuse, NSTEMI, GERD, hypertension, hyperlipidemia, tobacco use, Sjogren's syndrome, lupus who presented to the ED with progressively worsening paresthesias x1 month.  She developed numbness in her face which progressed to the rest of her body and noticed difficulty with ambulation and vomiting but appear to have initiated prior to the vaccine administration but patient indicates the symptoms continued to worsen after vaccine administration.  She went to the ED with improvement in symptoms without intervention and then 5 days later had an acute recurrence. She had some difficulty with ambulation at that time.  Also with progressively worsening pain in her hands and feet, described as feeling as though they are on fire.  Has had decreased dexterity and falling at home.  With leg weakness R>L.  She was initially seen by her rheumatologist who started her on gabapentin without improvement and saw Dr. [name], Neurolgy who initially saw her on 7/15 and started a steroid taper and Lyrica.  She had an LP which was reassuring and is scheduled for a nerve conduction test in August.  Currently her symptoms have been progressing and worsening and her Lyrica was increased on 7/25 however still no improvement. In ED patient found to be afebrile hemodynamically stable on room air.  Labs essentially unremarkable.  COVID-19 negative.  ED physician spoke with Dr. Otelia Limes, neurology who evaluated the patient in the ED followed by discussion with the patient's neurologist - plan to admit for close monitoring.  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 - Neuro following - symptoms not consistent with GBS given areflexia/waxing-waning presentation - Possibly due to chronic alcohol use/abuse - LP per neuro not consistent with GBS - MRI unremarkable for structural etiology IVIG x 5 days empirically per neuro -last dose 09/22/2019  AKI without history of CKD, resolved -Likely in the setting of poor p.o. intake, encouraged p.o. intake today, free water, liberalize diet Lab Results  Component Value Date   CREATININE 0.89 09/21/2019   CREATININE 1.50 (H) 09/20/2019   CREATININE 1.13 (H) 09/19/2019   Leg pain/burning, unspecified, acute on chronic, POA Rule out restless leg syndrome/diffuse neuropathy -Much more well controlled on increased dose of gabapentin and ropinirole -Will likely need outpatient evaluation for possible neuropathy -Previously placed on multivitamin and banana bag to rule out deficiency as cause  Hypokalemia Previously repleted  Hypertension Continue home losartan and metoprolol  CAD s/p DES May 2015 Continue home aspirin and Zetia I do not see a statin on her med list Lipid panel remarkable for hypertriglyceridemia, low HDL elevated LDL and VLDL - continue to impress upon the patient need for statin therapy and dietary changes   DVT prophylaxis: SCDs only, patient refusing Lovenox Code Status: Full Family Communication: None present  Status is: Inpatient  Dispo: The patient is from: Home              Anticipated d/c is to: Home              Anticipated d/c date is: 24-48 hours              Patient currently not  medically stable for discharge given ongoing need for IV Ig per neurology, close monitoring in the setting of atypical paresthesia symptoms as above  Consultants:   Neurology  Procedures:   LP 03/22/2019  Antimicrobials:  None indicated  Subjective: No acute issues or events overnight, patient was sleeping quite well, unfortunately she states that she was  awoken at 3 AM and asked if she required a sleeping medication. Otherwise indicates her neuropathy is moderately improving from previous denies nausea, vomiting, diarrhea, constipation, headache, fevers, chills.  Objective: Vitals:   09/20/19 1621 09/20/19 1947 09/20/19 2344 09/21/19 0341  BP: (!) 140/82 (!) 153/89 (!) 131/89 (!) 131/96  Pulse: 84 92 92 95  Resp: 16 16 17 16   Temp: (!) 97.4 F (36.3 C) 98.5 F (36.9 C) 98.2 F (36.8 C) 98.2 F (36.8 C)  TempSrc: Oral Oral Oral Oral  SpO2: 98% 98% 99% 99%  Weight:      Height:       No intake or output data in the 24 hours ending 09/21/19 0748 Filed Weights   09/18/19 0552  Weight: 56 kg    Examination:  General:  Pleasantly resting in bed, No acute distress. HEENT:  Normocephalic atraumatic.  Sclerae nonicteric, noninjected.  Extraocular movements intact bilaterally. Neck:  Without mass or deformity.  Trachea is midline. Lungs:  Clear to auscultate bilaterally without rhonchi, wheeze, or rales. Heart:  Regular rate and rhythm.  Without murmurs, rubs, or gallops. Abdomen:  Soft, nontender, nondistended.  Without guarding or rebound. Extremities: Without cyanosis, clubbing, edema, or obvious deformity. Vascular:  Dorsalis pedis and posterior tibial pulses palpable bilaterally. Skin:  Warm and dry, no erythema, no ulcerations.   Data Reviewed: I have personally reviewed following labs and imaging studies  CBC: Recent Labs  Lab 09/18/19 0559 09/19/19 0255 09/20/19 0220 09/21/19 0149  WBC 9.2 5.1 4.1 4.1  NEUTROABS 4.5  --   --   --   HGB 13.8 11.9* 11.4* 11.7*  HCT 41.7 37.1 33.9* 34.1*  MCV 105.8* 103.9* 102.7* 102.1*  PLT 246 136* 123* 121*   Basic Metabolic Panel: Recent Labs  Lab 09/18/19 0559 09/19/19 0255 09/20/19 0220 09/21/19 0149  NA 140  140 139 138 136  K 3.4*  3.4* 4.2 3.9 4.2  CL 103  103 103 103 103  CO2 23  23 25 27 25   GLUCOSE 119*  119* 70 132* 89  BUN 7  7 15  21* 17  CREATININE  0.88  0.88 1.13* 1.50* 0.89  CALCIUM 9.0  9.0 9.0 8.8* 8.8*   GFR: Estimated Creatinine Clearance: 61.9 mL/min (by C-G formula based on SCr of 0.89 mg/dL). Liver Function Tests: Recent Labs  Lab 09/20/19 0220 09/21/19 0149  AST 54* 48*  ALT 29 26  ALKPHOS 117 109  BILITOT 0.7 0.9  PROT 6.5 6.9  ALBUMIN 2.5* 2.6*   No results for input(s): LIPASE, AMYLASE in the last 168 hours. No results for input(s): AMMONIA in the last 168 hours. Coagulation Profile: No results for input(s): INR, PROTIME in the last 168 hours. Cardiac Enzymes: No results for input(s): CKTOTAL, CKMB, CKMBINDEX, TROPONINI in the last 168 hours. BNP (last 3 results) No results for input(s): PROBNP in the last 8760 hours. HbA1C: No results for input(s): HGBA1C in the last 72 hours. CBG: No results for input(s): GLUCAP in the last 168 hours. Lipid Profile: No results for input(s): CHOL, HDL, LDLCALC, TRIG, CHOLHDL, LDLDIRECT in the last 72 hours. Thyroid Function Tests: No results for  input(s): TSH, T4TOTAL, FREET4, T3FREE, THYROIDAB in the last 72 hours. Anemia Panel: No results for input(s): VITAMINB12, FOLATE, FERRITIN, TIBC, IRON, RETICCTPCT in the last 72 hours. Sepsis Labs: No results for input(s): PROCALCITON, LATICACIDVEN in the last 168 hours.  Recent Results (from the past 240 hour(s))  SARS Coronavirus 2 by RT PCR (hospital order, performed in [facility] hospital lab) Nasopharyngeal Nasopharyngeal Swab     Status: None   Collection Time: 09/18/19 11:22 AM   Specimen: Nasopharyngeal Swab  Result Value Ref Range Status   SARS Coronavirus 2 NEGATIVE NEGATIVE Final    Comment: (NOTE) SARS-CoV-2 target nucleic acids are NOT DETECTED.  The SARS-CoV-2 RNA is generally detectable in upper and lower respiratory specimens during the acute phase of infection. The lowest concentration of SARS-CoV-2 viral copies this assay can detect is 250 copies / mL. A negative result does not preclude SARS-CoV-2  infection and should not be used as the sole basis for treatment or other patient management decisions.  A negative result may occur with improper specimen collection / handling, submission of specimen other than nasopharyngeal swab, presence of viral mutation(s) within the areas targeted by this assay, and inadequate number of viral copies (<250 copies / mL). A negative result must be combined with clinical observations, patient history, and epidemiological information.  Fact Sheet for Patients:   BoilerBrush.com.cy  Fact Sheet for Healthcare Providers: https://pope.com/  This test is not yet approved or  cleared by the [place] FDA and has been authorized for detection and/or diagnosis of SARS-CoV-2 by FDA under an Emergency Use Authorization (EUA).  This EUA will remain in effect (meaning this test can be used) for the duration of the COVID-19 declaration under Section 564(b)(1) of the Act, 21 U.S.C. section 360bbb-3(b)(1), unless the authorization is terminated or revoked sooner.  Performed at [facility], [address], [place], [place] [address]     Radiology Studies: No results found.  Scheduled Meds: . aspirin EC  81 mg Oral Daily  . enoxaparin (LOVENOX) injection  40 mg Subcutaneous Q24H  . ezetimibe  10 mg Oral Daily  . feeding supplement (ENSURE ENLIVE)  237 mL Oral BID BM  . gabapentin  300 mg Oral TID  . losartan  75 mg Oral Daily  . metoprolol tartrate  25 mg Oral BID  . multivitamin with minerals  1 tablet Oral Daily  . pantoprazole  40 mg Oral Daily  . pilocarpine  5 mg Oral TID  . rOPINIRole  0.5 mg Oral QHS  . sucralfate  1 g Oral QHS  . zolpidem  5 mg Oral QHS   Continuous Infusions: . Immune Globulin 10% 20 g (09/20/19 1235)     LOS: 3 days   Time spent:  Azucena Fallen, DO [facility]  If 7PM-7AM, please contact night-coverage [email or web address]  09/21/2019, 7:48 AM

## 2019-09-21 NOTE — Progress Notes (Signed)
PT Cancellation Note  Patient Details Name: ANAB VIVAR MRN: [id number] DOB: [birth year]   Cancelled Treatment:    Reason Eval/Treat Not Completed: Discussed pt case with DO who reports pt does not need to be seen by PT this date as she is nearing d/c. OT scheduled to see this pt today, so will defer. Will continue to follow while admitted.   Marylynn Pearson 09/21/2019, 10:33 AM  Conni Slipper, PT, DPT Acute Rehabilitation Services Pager: [phone] Office: [phone]

## 2019-09-21 NOTE — Progress Notes (Signed)
Occupational Therapy Treatment Patient Details Name: Lynn Harvey MRN: [id number] DOB: Jan 11, 1966 Today's Date: 09/21/2019    History of present illness Pt is a 54 y/o female admitted secondary to increased numbness and tingling in hands and feet. Thought to be secondary to neuropathy. Pt reports it began after getting her second covid vaccine. PMH includes CAD, NSTEMI, HTN, and Lupus,history of Sjogren's syndrom,long-term EtOH abuse   OT comments  Upon arrival pt lethargic in bed but agreeable to skilled-OT services. Pt expressed she is still having pain in finger tips and feet. Pt wanting to brush teeth and needing min A to assist with opening packaging and toothpaste tube. Pt reported pain when water was too hot or too cold. Pt required supervision with ambulation due to decreased balance due to pain in feet. Pt and therapist discussed continuing desensitization techniques and potentially outpatient OT due to her needing her fingers to type at work and be productive. Believe pt would benefit from skilled OT services acutely and at the outpatient level to increase independence and decrease pain with ADLs and IADLs.   Follow Up Recommendations  Outpatient OT;Supervision - Intermittent    Equipment Recommendations  Other (comment) (may need RW depending on progression)       Precautions / Restrictions Precautions Precautions: Fall Restrictions Weight Bearing Restrictions: No       Mobility Bed Mobility Overal bed mobility: Independent                Transfers Overall transfer level: Needs assistance   Transfers: Sit to/from Stand Sit to Stand: Supervision         General transfer comment: supervision for safety    Balance Overall balance assessment: Mild deficits observed, not formally tested Sitting-balance support: Feet supported;No upper extremity supported Sitting balance-Leahy Scale: Good     Standing balance support: No upper extremity supported;During  functional activity Standing balance-Leahy Scale: Fair Standing balance comment: Able to maintain static standing without UE support but needs 1 UE A for dynamic tasks                           ADL either performed or assessed with clinical judgement   ADL Overall ADL's : Needs assistance/impaired     Grooming: Oral care;Standing;Minimal assistance Grooming Details (indicate cue type and reason): Min A to assist with screw top on toothpaste                             Functional mobility during ADLs: Supervision/safety General ADL Comments: Pt with decreased functioning in hands due to pain and decreased balance due to pain in feet               Cognition Arousal/Alertness: Awake/alert Behavior During Therapy: WFL for tasks assessed/performed Overall Cognitive Status: Within Functional Limits for tasks assessed                                 General Comments: pt able to recall activities from yesterday and results of medical work up              General Comments Pt expressing she is worried to go back to work due to need to type and she has not felt productive due to needing rest breaks and increased time due to 1 finger typing. Pt expressed interest in outpatient therapy  if available.     Pertinent Vitals/ Pain       Pain Assessment: Faces Faces Pain Scale: Hurts even more Pain Location: bilateral feet/LEs and bilateral hands Pain Descriptors / Indicators: Burning;Numbness;Tingling Pain Intervention(s): Limited activity within patient's tolerance;Monitored during session     Prior Functioning/Environment              Frequency  Min 2X/week        Progress Toward Goals  OT Goals(current goals can now be found in the care plan section)  Progress towards OT goals: Progressing toward goals  Acute Rehab OT Goals Patient Stated Goal: to be over this by the time I leave the hospital OT Goal Formulation: With patient Time  For Goal Achievement: 10/03/19 Potential to Achieve Goals: Good  Plan Discharge plan needs to be updated          End of Session    OT Visit Diagnosis: Unsteadiness on feet (R26.81);Other abnormalities of gait and mobility (R26.89);Other symptoms and signs involving the nervous system (R29.898);Pain Pain - Right/Left:  (bilateral) Pain - part of body:  (feet and hands)   Activity Tolerance Patient tolerated treatment well   Patient Left in bed;with call bell/phone within reach   Nurse Communication          Time: 3710-6269 OT Time Calculation (min): 14 min  Charges: OT General Charges $OT Visit: 1 Visit OT Treatments $Self Care/Home Management : 8-22 mins  Dai Mcadams/OTS   Zahra Peffley 09/21/2019, 12:50 PM

## 2019-09-22 LAB — COMPREHENSIVE METABOLIC PANEL
ALT: 27 U/L (ref 0–44)
AST: 60 U/L — ABNORMAL HIGH (ref 15–41)
Albumin: 2.5 g/dL — ABNORMAL LOW (ref 3.5–5.0)
Alkaline Phosphatase: 91 U/L (ref 38–126)
Anion gap: 7 (ref 5–15)
BUN: 11 mg/dL (ref 6–20)
CO2: 26 mmol/L (ref 22–32)
Calcium: 9 mg/dL (ref 8.9–10.3)
Chloride: 101 mmol/L (ref 98–111)
Creatinine, Ser: 0.68 mg/dL (ref 0.44–1.00)
GFR calc Af Amer: >60 mL/min (ref >60)
GFR calc non Af Amer: >60 mL/min (ref >60)
Glucose, Bld: 91 mg/dL (ref 70–99)
Potassium: 3.6 mmol/L (ref 3.5–5.1)
Sodium: 134 mmol/L — ABNORMAL LOW (ref 135–145)
Total Bilirubin: 1.2 mg/dL (ref 0.3–1.2)
Total Protein: 7.3 g/dL (ref 6.5–8.1)

## 2019-09-22 LAB — CBC
HCT: 32 % — ABNORMAL LOW (ref 36.0–46.0)
Hemoglobin: 10.8 g/dL — ABNORMAL LOW (ref 12.0–15.0)
MCH: 35 pg — ABNORMAL HIGH (ref 26.0–34.0)
MCHC: 33.8 g/dL (ref 30.0–36.0)
MCV: 103.6 fL — ABNORMAL HIGH (ref 80.0–100.0)
Platelets: 126 10*3/uL — ABNORMAL LOW (ref 150–400)
RBC: 3.09 MIL/uL — ABNORMAL LOW (ref 3.87–5.11)
RDW: 13.2 % (ref 11.5–15.5)
WBC: 3.9 10*3/uL — ABNORMAL LOW (ref 4.0–10.5)
nRBC: 0 % (ref 0.0–0.2)

## 2019-09-22 MED ORDER — ROPINIROLE HCL 0.5 MG PO TABS: 0.5000 mg | ORAL_TABLET | Freq: Every day | ORAL | 0 refills | Status: DC

## 2019-09-22 MED ORDER — GABAPENTIN 300 MG PO CAPS: 300.0000 mg | ORAL_CAPSULE | Freq: Three times a day (TID) | ORAL | 0 refills | Status: DC

## 2019-09-22 MED ORDER — BISMUTH SUBSALICYLATE 262 MG/15ML PO SUSP
30.0000 mL | Freq: Once | ORAL | Status: AC
  Administered 2019-09-22: 30 mL via ORAL
  Filled 2019-09-22: qty 236

## 2019-09-22 MED ORDER — ADULT MULTIVITAMIN W/MINERALS CH: 1.0000 | ORAL_TABLET | Freq: Every day | ORAL | 0 refills | Status: AC

## 2019-09-22 NOTE — Progress Notes (Signed)
Subjective: No significant change.   Exam: Vitals:   09/22/19 0136 09/22/19 0822  BP: (!) 153/88 137/82  Pulse: 86 79  Resp: 18 18  Temp: 98.7 F (37.1 C) 98.2 F (36.8 C)  SpO2: 99% 100%   Gen: In bed, NAD Resp: non-labored breathing, no acute distress Abd: soft, nt  Neuro: MS: awake, alert, interactive and appropriate CN: PERRL, EOMI Motor: 5/5 thorughout Sensory:decreased to temperature to mid calf, decreased to LT to thigh, decreased in hands to just above wrist.  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 Her symptoms appear to have plateaued at this point, and she is receiving IVIG. I do not think that EMG is at all likely to change management at this time. Also, with normal ESR, I think that vasculitic neuropathy is very unlikely and would not pursue nerve biopsy. 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 She does get some benefit from gabapentin, and will try increasing dose.   Recommendations: 1) finish IVIG(today is day#5/5) 2) increase gabapentin to 613m TID 3) continue PT/OT.   MRoland Rack MD [facility] [phone] If 7pm- 7am, please page neurology [place] call [place]

## 2019-09-22 NOTE — Progress Notes (Signed)
Pt's IV removed; site is clean, dry and intact. Discharge instructions reviewed with pt; voiced understanding. Personal belongings were packed by pt. Pt. transported via wheelchair by volunteer to private vehicle driven by pt's daughter.

## 2019-09-22 NOTE — Progress Notes (Signed)
Physical Therapy Treatment Patient Details Name: Lynn Harvey MRN: [id number] DOB: 1965/09/26 Today's Date: 09/22/2019    History of Present Illness Pt is a 54 y/o female admitted secondary to increased numbness and tingling in hands and feet. Thought to be secondary to neuropathy. Pt reports it began after getting her second covid vaccine. PMH includes CAD, NSTEMI, HTN, and Lupus,history of Sjogren's syndrom,long-term EtOH abuse    PT Comments     Pt reports continued pain in B hands and feet, reporting feet are worse than hands at this time. Recommended RW use at this time due to LE weakness and feeling of knees/hips giving out during ambulation. Pt has a walker at home and it was recommended that she use light pressure through the handles until sensitivity in hands improves. If it is still too much, there is always the option for platforms if needed. We reviewed desensitizing activity with different textures on hands and feet, however at this time pt can barely tolerate a blanket over her feet unless it is a very light and soft blanket (brought in from home - cannot tolerate hospital blanket or socks). We discussed possible ways to buffer sensitivity of fingers during typing with light gloves, however pt doubtful that she could tolerate wearing anything like a glove at this time. Pt anticipates d/c home this afternoon with outpatient PT beginning soon. Will continue to follow until d/c.   Follow Up Recommendations  Outpatient PT (Neuro Outpatient PT)     Equipment Recommendations  None recommended by PT    Recommendations for Other Services       Precautions / Restrictions Precautions Precautions: Fall Restrictions Weight Bearing Restrictions: No    Mobility   Pt declined mobility this session due to B feet pain and discharge soon this afternoon.    Wheelchair Mobility    Modified Rankin (Stroke Patients Only)       Balance                                             Cognition Arousal/Alertness: Awake/alert Behavior During Therapy: WFL for tasks assessed/performed Overall Cognitive Status: Within Functional Limits for tasks assessed                                        Exercises      General Comments        Pertinent Vitals/Pain Pain Assessment: Faces Faces Pain Scale: Hurts even more Pain Location: bilateral feet/LEs and bilateral hands Pain Descriptors / Indicators: Burning;Numbness;Tingling Pain Intervention(s): Monitored during session    Home Living                      Prior Function            PT Goals (current goals can now be found in the care plan section) Acute Rehab PT Goals Patient Stated Goal: Be able to work/type PT Goal Formulation: With patient Time For Goal Achievement: 10/02/19 Potential to Achieve Goals: Good Progress towards PT goals: Progressing toward goals    Frequency    Min 3X/week      PT Plan Discharge plan needs to be updated    Co-evaluation              AM-PAC PT "6  Clicks" Mobility   Outcome Measure  Help needed turning from your back to your side while in a flat bed without using bedrails?: None Help needed moving from lying on your back to sitting on the side of a flat bed without using bedrails?: None Help needed moving to and from a bed to a chair (including a wheelchair)?: A Little Help needed standing up from a chair using your arms (e.g., wheelchair or bedside chair)?: A Little Help needed to walk in hospital room?: A Little Help needed climbing 3-5 steps with a railing? : A Little 6 Click Score: 20    End of Session     Patient left: in bed;with call bell/phone within reach   PT Visit Diagnosis: Unsteadiness on feet (R26.81);Muscle weakness (generalized) (M62.81);Other symptoms and signs involving the nervous system (R29.898)     Time: 9798-9211 PT Time Calculation (min) (ACUTE ONLY): 10 min  Charges:  $Self Care/Home  Management: 8-22                     Conni Slipper, PT, DPT Acute Rehabilitation Services Pager: [phone] Office: [phone]    Lynn Harvey 09/22/2019, 3:01 PM

## 2019-09-22 NOTE — TOC Transition Note (Signed)
Transition of Care [facility]) - CM/SW Discharge Note   Patient Details  Name: Lynn Harvey MRN: [id number] Date of Birth: 08-06-1965  [facility]) CM/SW Contact:  Kermit Balo, RN Phone Number: 09/22/2019, 12:35 PM   Clinical Narrative:    Pt discharging home with outpatient therapy through the [facility]. Orders in Epic and information on the AVS. Pt has transportation home.    Final next level of care: OP Rehab Barriers to Discharge: No Barriers Identified   Patient Goals and CMS Choice        Discharge Placement                       Discharge Plan and Services                                     Social Determinants of Health (SDOH) Interventions     Readmission Risk Interventions No flowsheet data found.

## 2019-09-22 NOTE — Plan of Care (Signed)
Pt alert and oriented. Pt is on RA. Sleep aid given. Pt states she feel like nothing has changed since her being in the hospital and she is still in pain.  Problem: Education: Goal: Knowledge of General Education information will improve Description: Including pain rating scale, medication(s)/side effects and non-pharmacologic comfort measures Outcome: Progressing   Problem: Health Behavior/Discharge Planning: Goal: Ability to manage health-related needs will improve Outcome: Progressing   Problem: Clinical Measurements: Goal: Ability to maintain clinical measurements within normal limits will improve Outcome: Progressing Goal: Will remain free from infection Outcome: Progressing Goal: Diagnostic test results will improve Outcome: Progressing Goal: Respiratory complications will improve Outcome: Progressing Goal: Cardiovascular complication will be avoided Outcome: Progressing   Problem: Activity: Goal: Risk for activity intolerance will decrease Outcome: Progressing   Problem: Nutrition: Goal: Adequate nutrition will be maintained Outcome: Progressing   Problem: Coping: Goal: Level of anxiety will decrease Outcome: Progressing   Problem: Elimination: Goal: Will not experience complications related to bowel motility Outcome: Progressing Goal: Will not experience complications related to urinary retention Outcome: Progressing   Problem: Pain Managment: Goal: General experience of comfort will improve Outcome: Progressing   Problem: Safety: Goal: Ability to remain free from injury will improve Outcome: Progressing   Problem: Skin Integrity: Goal: Risk for impaired skin integrity will decrease Outcome: Progressing

## 2019-09-22 NOTE — Discharge Summary (Signed)
Physician Discharge Summary  Lynn Harvey JKK:[id number] DOB: 06/07/1965 DOA: 09/18/2019  PCP: Karleen Hampshire., MD  Admit date: 09/18/2019 Discharge date: 09/22/2019  Admitted From: Home Disposition: Home  Recommendations for Outpatient Follow-up:  1. Follow up with PCP the next 1 to 2 weeks, follow-up with neurology on 10/01/2019 as scheduled 2. Please obtain BMP/CBC in one week  Discharge Condition: Stable CODE STATUS: Full Diet recommendation: As tolerated  Brief/Interim Summary: This is a 54 year old female with past medical history of CAD, chronic alcohol use/abuse, NSTEMI, GERD, hypertension, hyperlipidemia, tobacco use, Sjogren's syndrome, lupus who presented to the ED with progressively worsening paresthesias x1 month. She developed numbness in her face which progressed to the rest of her body and noticed difficulty with ambulation and vomiting but appear to have initiated prior to the vaccine administration but patient indicates the symptoms continued to worsen after vaccine administration. She went to the ED with improvement in symptoms without intervention and then 5 days later had an acute recurrence. She had some difficulty with ambulation at that time. Also with progressively worsening pain in her hands and feet, described as feeling as though they are on fire. Has had decreased dexterity and falling at home. With leg weakness R>L. She was initially seen by her rheumatologist who started her on gabapentin without improvement and saw Dr. Lavell Anchors, Neurolgywho initially saw her on 7/15 and started a steroid taper and Lyrica. She had an LP which was reassuring and is scheduled for a nerve conduction test in August. Currently her symptoms have been progressing and worsening and her Lyrica was increased on 7/25 however still no improvement. In ED patient found to be afebrile hemodynamically stable on room air. Labs essentially unremarkable. COVID-19 negative. ED physician spoke with  Dr. Cheral Marker, neurology who evaluated the patient in the ED followed by discussion with the patient's neurologist - plan to admit for close monitoring.  Patient admitted as above with acute atypical neuropathy and symptoms that occurred around vaccine administration.  Patient's ESR was negative and findings were not consistent with Guillain-Barr, neurology was following along, patient has completed 5-day course of IVIG with minimal to moderate improvement, patient was also transitioned from Lyrica to gabapentin and ropinirole given ongoing symptoms of painful paresthesias distally over the feet and hands.  She appeared to have moderate improvement with gabapentin increase more so than with any other therapy.  Neurology following along closely we appreciate insight and recommendations.  She has close follow-up with neurology appointment on 10/01/2019 for further evaluation and treatment.  Unclear etiology as patient does not fit typical Guillain-Barr syndrome, and while malnutrition and Warnicke's cyst and autoimmune inflammatory vasculitis or neuropathy may be reasonable evaluation will be required in the outpatient setting for diagnosis.  Patient is otherwise stable and agreeable for discharge home.  Discharge Diagnoses:  Principal Problem:   Neuropathy Active Problems:   CAD S/P LAD DES May 2015    Discharge Instructions  Discharge Instructions    Ambulatory referral to Occupational Therapy   Complete by: As directed    Call MD for:  severe uncontrolled pain   Complete by: As directed    Diet - low sodium heart healthy   Complete by: As directed    Increase activity slowly   Complete by: As directed      Allergies as of 09/22/2019      Reactions   Buprenorphine Hcl Nausea And Vomiting   Doxycycline Nausea Only   Morphine Nausea And Vomiting   Morphine And  Related Nausea And Vomiting   Ace Inhibitors Cough      Medication List    STOP taking these medications   ALPRAZolam 0.25 MG  tablet Commonly known as: Xanax   methylPREDNISolone 4 MG Tbpk tablet Commonly known as: MEDROL DOSEPAK   pregabalin 100 MG capsule Commonly known as: LYRICA     TAKE these medications   amphetamine-dextroamphetamine 15 MG tablet Commonly known as: ADDERALL Take 0.5 tablets by mouth 2 (two) times daily. What changed: how much to take   aspirin EC 81 MG tablet Take 81 mg by mouth daily.   ezetimibe 10 MG tablet Commonly known as: ZETIA Take 1 tablet (10 mg total) by mouth daily.   gabapentin 300 MG capsule Commonly known as: NEURONTIN Take 1-2 capsules (300-600 mg total) by mouth 3 (three) times daily.   losartan 50 MG tablet Commonly known as: COZAAR Take 1.5 tablets (75 mg total) by mouth daily.   metoprolol tartrate 25 MG tablet Commonly known as: LOPRESSOR Take 1 tablet (25 mg total) by mouth 2 (two) times daily. OFFICE VISIT NEEDED   multivitamin with minerals Tabs tablet Take 1 tablet by mouth daily.   nitroGLYCERIN 0.4 MG SL tablet Commonly known as: NITROSTAT Place 1 tablet (0.4 mg total) under the tongue every 5 (five) minutes as needed for chest pain.   omeprazole 20 MG capsule Commonly known as: PRILOSEC Take 20 mg by mouth 2 (two) times daily before a meal.   ondansetron 4 MG tablet Commonly known as: ZOFRAN Take 1 tablet (4 mg total) by mouth every 8 (eight) hours as needed for nausea or vomiting.   pilocarpine 5 MG tablet Commonly known as: SALAGEN Take 5 mg by mouth 3 (three) times daily.   promethazine 25 MG tablet Commonly known as: PHENERGAN Take 1 tablet (25 mg total) by mouth every 6 (six) hours as needed for nausea or vomiting.   rOPINIRole 0.5 MG tablet Commonly known as: REQUIP Take 1 tablet (0.5 mg total) by mouth at bedtime.   valACYclovir 500 MG tablet Commonly known as: VALTREX Take 500 mg by mouth 3 (three) times daily as needed (fever blister).   zolpidem 5 MG tablet Commonly known as: AMBIEN Take 5 mg by mouth at  bedtime.       Follow-up Information    [facility].   Specialty: Rehabilitation Why: The outpatient therapy will contact you for the first appointment Contact information: [place] [address] [phone]             Allergies  Allergen Reactions  . Buprenorphine Hcl Nausea And Vomiting  . Doxycycline Nausea Only  . Morphine Nausea And Vomiting  . Morphine And Related Nausea And Vomiting  . Ace Inhibitors Cough    Consultations: Neurology  Procedures/Studies: MR BRAIN W WO CONTRAST  Result Date: 09/09/2019 [facility] NEUROIMAGING REPORT STUDY DATE: 09/08/19 PATIENT NAME: ANNESHA DELGRECO DOB: Mar 28, 1965 MRN: [id number] ORDERING CLINICIAN: Melvenia Beam, MD CLINICAL HISTORY: 54 year old female with numbness and gait difficulty. EXAM: MR BRAIN W WO CONTRAST TECHNIQUE: MRI of the brain without contrast was obtained utilizing 5 mm axial slices with T1, T2, T2 flair, T2 star gradient echo and diffusion weighted views.  T1 sagittal and T2 coronal views were obtained. CONTRAST: 56m multihance COMPARISON: none IMAGING SITE: [facility] [address] (1.5 Tesla MRI) FINDINGS: No abnormal lesions are seen on diffusion-weighted views to suggest acute ischemia. The  cortical sulci, fissures and cisterns are normal in size and appearance. Lateral, third and fourth ventricle are normal in size and appearance. No extra-axial fluid collections are seen. No evidence of mass effect or midline shift.  Small right frontal and left peri-atrial foci of T2 hyperintensities. No abnormal lesions on post-contrast views. On sagittal views the posterior fossa, pituitary gland and corpus callosum are unremarkable. No evidence of intracranial hemorrhage on gradient echo views. The orbits and their contents, paranasal sinuses and calvarium are unremarkable.  Intracranial flow  voids are present.   MRI brain (with and without) demonstrating: - Small right frontal and left peri-atrial foci of T2 hyperintensities. No abnormal lesions on post-contrast views. - No acute findings. INTERPRETING PHYSICIAN: Penni Bombard, MD Certified in Neurology, Neurophysiology and Neuroimaging [facility] [address] [place], [place] [address] [phone]   MR CERVICAL SPINE WO CONTRAST  Result Date: 09/09/2019 [facility] NEUROIMAGING REPORT STUDY DATE: 09/08/19 PATIENT NAME: KECIA SWOBODA DOB: [birth year] MRN: [id number] ORDERING CLINICIAN: Melvenia Beam, MD CLINICAL HISTORY: 54 year old female with numbness. EXAM: MR CERVICAL SPINE WO CONTRAST TECHNIQUE: MRI of the cervical spine was obtained utilizing 3 mm sagittal slices from the posterior fossa down to the T3-4 level with T1, T2 and inversion recovery views. In addition 4 mm axial slices from D4-2 down to T1-2 level were included with T2 and gradient echo views. CONTRAST: none COMPARISON: none IMAGING SITE: [facility] [address] (1.5 Tesla MRI) FINDINGS: On sagittal views the vertebral bodies have normal height and alignment.  The spinal cord is normal in size and appearance. The posterior fossa, pituitary gland and paraspinal soft tissues are unremarkable.  On axial views: C2-3: no spinal stenosis or foraminal narrowing C3-4: uncovertebral joint hypertrophy and facet hypertrophy with moderate left foraminal stenosis C4-5: no spinal stenosis or foraminal narrowing C5-6: disc bulging with no spinal stenosis or foraminal narrowing C6-7: no spinal stenosis or foraminal narrowing C7-T1: no spinal stenosis or foraminal narrowing Limited views of the soft tissues of the head and neck are unremarkable.   MRI cervical spine (without) demonstrating: - At C3-4: uncovertebral joint hypertrophy and facet hypertrophy with moderate left foraminal stenosis. - No spinal cord signal  abnormalities. INTERPRETING PHYSICIAN: Penni Bombard, MD Certified in Neurology, Neurophysiology and Neuroimaging [facility] [address] [place], [place] [address] [phone]   ECHOCARDIOGRAM COMPLETE  Result Date: 09/09/2019    ECHOCARDIOGRAM REPORT   Patient Name:   YUNUEN MORDAN Date of Exam: 09/09/2019 Medical Rec #:  [id number]       Height:       63.0 in Accession #:    [id number]      Weight:       118.0 lb Date of Birth:  1966/01/14       BSA:          1.545 m Patient Age:    22 years        BP:           130/80 mmHg Patient Gender: F               HR:           95 bpm. Exam Location:  [facility] Procedure: 2D Echo, 3D Echo, Cardiac Doppler, Color Doppler and Strain Analysis Indications:    I25.1 CAD  History:        Patient has prior history of Echocardiogram examinations, most  recent 07/29/2013. Previous Myocardial Infarction and CAD; Risk                 Factors:Hypertension, Current Smoker and Dyslipidemia. Anemia.                 Edema. EtOH.  Sonographer:    Jessee Avers, RDCS Referring Phys: [place]  1. Left ventricular ejection fraction, by estimation, is 60 to 65%. Left ventricular ejection fraction by 3D volume is 59 %. The left ventricle has normal function. The left ventricle has no regional wall motion abnormalities. Left ventricular diastolic  parameters are consistent with Grade I diastolic dysfunction (impaired relaxation). The average left ventricular global longitudinal strain is -18.5 %. The global longitudinal strain is normal.  2. Right ventricular systolic function is normal. The right ventricular size is normal. Tricuspid regurgitation signal is inadequate for assessing PA pressure.  3. The mitral valve is grossly normal. Mild mitral valve regurgitation. No evidence of mitral stenosis.  4. The aortic valve is tricuspid. Aortic valve regurgitation is not visualized. No aortic stenosis is present.  5.  The inferior vena cava is normal in size with greater than 50% respiratory variability, suggesting right atrial pressure of 3 mmHg. Comparison(s): Prior images unable to be directly viewed, comparison made by report only. No significant change from prior study. 07/29/13 EF 55%. FINDINGS  Left Ventricle: Left ventricular ejection fraction, by estimation, is 60 to 65%. Left ventricular ejection fraction by 3D volume is 59 %. The left ventricle has normal function. The left ventricle has no regional wall motion abnormalities. The average left ventricular global longitudinal strain is -18.5 %. The global longitudinal strain is normal. The left ventricular internal cavity size was normal in size. There is no left ventricular hypertrophy. Left ventricular diastolic parameters are consistent  with Grade I diastolic dysfunction (impaired relaxation). Normal left ventricular filling pressure. Right Ventricle: The right ventricular size is normal. No increase in right ventricular wall thickness. Right ventricular systolic function is normal. Tricuspid regurgitation signal is inadequate for assessing PA pressure. Left Atrium: Left atrial size was normal in size. Right Atrium: Right atrial size was normal in size. Pericardium: Trivial pericardial effusion is present. Presence of pericardial fat pad. Mitral Valve: The mitral valve is grossly normal. Mild mitral valve regurgitation. No evidence of mitral valve stenosis. Tricuspid Valve: The tricuspid valve is grossly normal. Tricuspid valve regurgitation is not demonstrated. No evidence of tricuspid stenosis. Aortic Valve: The aortic valve is tricuspid. Aortic valve regurgitation is not visualized. No aortic stenosis is present. Pulmonic Valve: The pulmonic valve was grossly normal. Pulmonic valve regurgitation is not visualized. No evidence of pulmonic stenosis. Aorta: The aortic root and ascending aorta are structurally normal, with no evidence of dilitation. Venous: The right  upper pulmonary vein is normal. The inferior vena cava is normal in size with greater than 50% respiratory variability, suggesting right atrial pressure of 3 mmHg. IAS/Shunts: The atrial septum is grossly normal.  LEFT VENTRICLE PLAX 2D LVIDd:         4.00 cm         Diastology LVIDs:         2.80 cm         LV e' lateral:   0.09 cm/s LV PW:         0.70 cm         LV E/e' lateral: 8.1 LV IVS:        0.60 cm  LV e' medial:    0.07 cm/s LVOT diam:     2.00 cm         LV E/e' medial:  10.7 LV SV:         63 LV SV Index:   41              2D LVOT Area:     3.14 cm        Longitudinal                                Strain                                2D Strain GLS  -15.3 %                                (A2C):                                2D Strain GLS  -23.0 %                                (A3C):                                2D Strain GLS  -17.3 %                                (A4C):                                2D Strain GLS  -18.5 %                                Avg:                                 3D Volume EF                                LV 3D EF:    Left                                             ventricular                                             ejection                                             fraction by  3D volume                                             is 59 %.                                 3D Volume EF:                                3D EF:        59 %                                LV EDV:       73 ml                                LV ESV:       30 ml                                LV SV:        43 ml RIGHT VENTRICLE RV Basal diam:  2.60 cm RV S prime:     9.21 cm/s TAPSE (M-mode): 1.2 cm LEFT ATRIUM             Index       RIGHT ATRIUM           Index LA diam:        2.80 cm 1.81 cm/m  RA Pressure: 3.00 mmHg LA Vol (A2C):   24.8 ml 16.05 ml/m RA Area:     6.91 cm LA Vol (A4C):   18.3 ml 11.84 ml/m RA Volume:   12.80 ml  8.28  ml/m LA Biplane Vol: 21.4 ml 13.85 ml/m  AORTIC VALVE LVOT Vmax:   99.90 cm/s LVOT Vmean:  64.200 cm/s LVOT VTI:    0.200 m  AORTA Ao Root diam: 2.90 cm Ao Asc diam:  2.60 cm MITRAL VALVE               TRICUSPID VALVE                            Estimated RAP:  3.00 mmHg  MV E velocity: 0.71 cm/s   SHUNTS MV A velocity: 81.80 cm/s  Systemic VTI:  0.20 m MV E/A ratio:  0.01        Systemic Diam: 2.00 cm Eleonore Chiquito MD Electronically signed by Eleonore Chiquito MD Signature Date/Time: 09/09/2019/1:44:16 PM    Final    DG FLUORO GUIDED LOC OF NEEDLE/CATH TIP FOR SPINAL INJECT RT  Result Date: 09/11/2019 CLINICAL DATA:  Ataxia and paresthesia after vaccination. Evaluation for Guillain-Barre syndrome. EXAM: DIAGNOSTIC LUMBAR PUNCTURE UNDER FLUOROSCOPIC GUIDANCE FLUOROSCOPY TIME:  Fluoroscopy Time: 0 minutes 17 seconds. 6.98 micro gray meter squared PROCEDURE: Informed consent was obtained from the patient prior to the procedure, including potential complications of headache, allergy, and pain. With the patient prone, the lower back was prepped with Betadine. 1% Lidocaine was used for local anesthesia. Lumbar puncture was performed at the right L3-4  level using a 20 gauge needle with return of clear CSF with an opening pressure of 14 cm water. Five ml of CSF were obtained for laboratory studies. The patient tolerated the procedure well and there were no apparent complications. IMPRESSION: Lumbar puncture on the right at L3-4. Electronically Signed   By: Nelson Chimes M.D.   On: 09/11/2019 13:55      Subjective: No acute issues or events overnight, patient sleeping much better on new regimen denies nausea, vomiting, diarrhea, constipation, headache, fevers, chills, chest pain, shortness of breath.  Paresthesias of her hands and feet are ongoing but moderately improving from admission.   Discharge Exam: Vitals:   09/22/19 0822 09/22/19 1156  BP: 137/82 130/86  Pulse: 79 78  Resp: 18 18  Temp: 98.2 F (36.8  C) 98.3 F (36.8 C)  SpO2: 100% 100%   Vitals:   09/21/19 1946 09/22/19 0136 09/22/19 0822 09/22/19 1156  BP: 119/74 (!) 153/88 137/82 130/86  Pulse: 86 86 79 78  Resp: '17 18 18 18  ' Temp: 98.6 F (37 C) 98.7 F (37.1 C) 98.2 F (36.8 C) 98.3 F (36.8 C)  TempSrc: Oral Oral Oral Oral  SpO2: 98% 99% 100% 100%  Weight:      Height:        General: Pt is alert, awake, not in acute distress Cardiovascular: RRR, S1/S2 +, no rubs, no gallops Respiratory: CTA bilaterally, no wheezing, no rhonchi Abdominal: Soft, NT, ND, bowel sounds + Extremities: no edema, no cyanosis    The results of significant diagnostics from this hospitalization (including imaging, microbiology, ancillary and laboratory) are listed below for reference.     Microbiology: Recent Results (from the past 240 hour(s))  SARS Coronavirus 2 by RT PCR (hospital order, performed in [facility] hospital lab) Nasopharyngeal Nasopharyngeal Swab     Status: None   Collection Time: 09/18/19 11:22 AM   Specimen: Nasopharyngeal Swab  Result Value Ref Range Status   SARS Coronavirus 2 NEGATIVE NEGATIVE Final    Comment: (NOTE) SARS-CoV-2 target nucleic acids are NOT DETECTED.  The SARS-CoV-2 RNA is generally detectable in upper and lower respiratory specimens during the acute phase of infection. The lowest concentration of SARS-CoV-2 viral copies this assay can detect is 250 copies / mL. A negative result does not preclude SARS-CoV-2 infection and should not be used as the sole basis for treatment or other patient management decisions.  A negative result may occur with improper specimen collection / handling, submission of specimen other than nasopharyngeal swab, presence of viral mutation(s) within the areas targeted by this assay, and inadequate number of viral copies (<250 copies / mL). A negative result must be combined with clinical observations, patient history, and epidemiological information.  Fact Sheet for  Patients:   StrictlyIdeas.no  Fact Sheet for Healthcare Providers: BankingDealers.co.za  This test is not yet approved or  cleared by the [place] FDA and has been authorized for detection and/or diagnosis of SARS-CoV-2 by FDA under an Emergency Use Authorization (EUA).  This EUA will remain in effect (meaning this test can be used) for the duration of the COVID-19 declaration under Section 564(b)(1) of the Act, 21 U.S.C. section 360bbb-3(b)(1), unless the authorization is terminated or revoked sooner.  Performed at [facility], [place] [address] [place], [place] [address]      Labs: BNP (last 3 results) No results for input(s): BNP in the last 8760 hours. Basic Metabolic Panel: Recent Labs  Lab 09/18/19 0559 09/19/19 0255  09/20/19 0220 09/21/19 0149 09/22/19 0242  NA 140  140 139 138 136 134*  K 3.4*  3.4* 4.2 3.9 4.2 3.6  CL 103  103 103 103 103 101  CO2 '23  23 25 27 25 26  ' GLUCOSE 119*  119* 70 132* 89 91  BUN '7  7 15 ' 21* 17 11  CREATININE 0.88  0.88 1.13* 1.50* 0.89 0.68  CALCIUM 9.0  9.0 9.0 8.8* 8.8* 9.0   Liver Function Tests: Recent Labs  Lab 09/20/19 0220 09/21/19 0149 09/22/19 0242  AST 54* 48* 60*  ALT '29 26 27  ' ALKPHOS 117 109 91  BILITOT 0.7 0.9 1.2  PROT 6.5 6.9 7.3  ALBUMIN 2.5* 2.6* 2.5*   No results for input(s): LIPASE, AMYLASE in the last 168 hours. No results for input(s): AMMONIA in the last 168 hours. CBC: Recent Labs  Lab 09/18/19 0559 09/19/19 0255 09/20/19 0220 09/21/19 0149 09/22/19 0242  WBC 9.2 5.1 4.1 4.1 3.9*  NEUTROABS 4.5  --   --   --   --   HGB 13.8 11.9* 11.4* 11.7* 10.8*  HCT 41.7 37.1 33.9* 34.1* 32.0*  MCV 105.8* 103.9* 102.7* 102.1* 103.6*  PLT 246 136* 123* 121* 126*   Cardiac Enzymes: No results for input(s): CKTOTAL, CKMB, CKMBINDEX, TROPONINI in the last 168 hours. BNP: Invalid input(s): POCBNP CBG: No results for input(s): GLUCAP in the  last 168 hours. D-Dimer No results for input(s): DDIMER in the last 72 hours. Hgb A1c No results for input(s): HGBA1C in the last 72 hours. Lipid Profile No results for input(s): CHOL, HDL, LDLCALC, TRIG, CHOLHDL, LDLDIRECT in the last 72 hours. Thyroid function studies No results for input(s): TSH, T4TOTAL, T3FREE, THYROIDAB in the last 72 hours.  Invalid input(s): FREET3 Anemia work up No results for input(s): VITAMINB12, FOLATE, FERRITIN, TIBC, IRON, RETICCTPCT in the last 72 hours. Urinalysis    Component Value Date/Time   COLORURINE YELLOW 07/12/2018 1121   APPEARANCEUR CLEAR 07/12/2018 1121   LABSPEC 1.003 (L) 07/12/2018 1121   PHURINE 8.0 07/12/2018 1121   GLUCOSEU NEGATIVE 07/12/2018 1121   [place] 07/12/2018 1121   [place] 07/12/2018 1121   KETONESUR NEGATIVE 07/12/2018 1121   PROTEINUR NEGATIVE 07/12/2018 1121   NITRITE NEGATIVE 07/12/2018 1121   LEUKOCYTESUR NEGATIVE 07/12/2018 1121   Sepsis Labs Invalid input(s): PROCALCITONIN,  WBC,  LACTICIDVEN Microbiology Recent Results (from the past 240 hour(s))  SARS Coronavirus 2 by RT PCR (hospital order, performed in [facility] hospital lab) Nasopharyngeal Nasopharyngeal Swab     Status: None   Collection Time: 09/18/19 11:22 AM   Specimen: Nasopharyngeal Swab  Result Value Ref Range Status   SARS Coronavirus 2 NEGATIVE NEGATIVE Final    Comment: (NOTE) SARS-CoV-2 target nucleic acids are NOT DETECTED.  The SARS-CoV-2 RNA is generally detectable in upper and lower respiratory specimens during the acute phase of infection. The lowest concentration of SARS-CoV-2 viral copies this assay can detect is 250 copies / mL. A negative result does not preclude SARS-CoV-2 infection and should not be used as the sole basis for treatment or other patient management decisions.  A negative result may occur with improper specimen collection / handling, submission of specimen other than nasopharyngeal swab,  presence of viral mutation(s) within the areas targeted by this assay, and inadequate number of viral copies (<250 copies / mL). A negative result must be combined with clinical observations, patient history, and epidemiological information.  Fact Sheet for Patients:   StrictlyIdeas.no  Fact Sheet for Healthcare Providers: BankingDealers.co.za  This test is not yet approved or  cleared by the [place] FDA and has been authorized for detection and/or diagnosis of SARS-CoV-2 by FDA under an Emergency Use Authorization (EUA).  This EUA will remain in effect (meaning this test can be used) for the duration of the COVID-19 declaration under Section 564(b)(1) of the Act, 21 U.S.C. section 360bbb-3(b)(1), unless the authorization is terminated or revoked sooner.  Performed at [facility], [place] [address] [place], [place] [address]      Time coordinating discharge: Over 30 minutes  SIGNED:   Little Ishikawa, DO [facility] 09/22/2019, 12:18 PM Pager   If 7PM-7AM, please contact night-coverage [email or web address]

## 2019-09-23 ENCOUNTER — Telehealth: Payer: Self-pay | Admitting: Neurology

## 2019-09-23 MED ORDER — GABAPENTIN 300 MG PO CAPS: 300.0000 mg | ORAL_CAPSULE | Freq: Three times a day (TID) | ORAL | 0 refills | Status: DC

## 2019-09-23 MED ORDER — GABAPENTIN 400 MG PO CAPS: 400.0000 mg | ORAL_CAPSULE | Freq: Three times a day (TID) | ORAL | 5 refills | Status: DC

## 2019-09-23 NOTE — Addendum Note (Signed)
Addended by: Melvyn Novas on: 09/23/2019 04:41 PM   Modules accepted: Orders

## 2019-09-23 NOTE — Telephone Encounter (Signed)
Can we just go to either 400 mg or 200 mg times two? I will place an order back to 400 mg. CD

## 2019-09-23 NOTE — Telephone Encounter (Signed)
Pt states she was just recently discharged from the hospital and they made a change to her gabapentin (NEURONTIN) 300 MG capsule.  Pt states she is not able to get her 400mg  out of the 300mg  and she knows she is not ready for 600 mg just yet, please call

## 2019-09-30 ENCOUNTER — Other Ambulatory Visit: Payer: Self-pay | Admitting: Neurology

## 2019-09-30 DIAGNOSIS — G619 Inflammatory polyneuropathy, unspecified: Secondary | ICD-10-CM

## 2019-09-30 MED ORDER — GABAPENTIN 600 MG PO TABS: 600.0000 mg | ORAL_TABLET | Freq: Three times a day (TID) | ORAL | 3 refills | Status: DC

## 2019-09-30 NOTE — Addendum Note (Signed)
Addended by: Bertram Savin on: 09/30/2019 02:58 PM   Modules accepted: Orders

## 2019-09-30 NOTE — Telephone Encounter (Addendum)
Spoke with pt. She has been on Gabapentin 400 mg TID since 09/24/2019. She feels the increased dose helps some but she still hurts bad. She is willing to increase to 600 mg TID. Patient is also in agreement to be referred to a pain management clinic. She has an EMG here tomorrow AM. Her questions were answered. I told her we would be in touch about the increase in Gabapentin. She verbalized appreciation.   Referral to pain clinic ordered.

## 2019-10-01 ENCOUNTER — Ambulatory Visit (INDEPENDENT_AMBULATORY_CARE_PROVIDER_SITE_OTHER): Payer: 59 | Admitting: Neurology

## 2019-10-01 DIAGNOSIS — G5621 Lesion of ulnar nerve, right upper limb: Secondary | ICD-10-CM

## 2019-10-01 DIAGNOSIS — G5603 Carpal tunnel syndrome, bilateral upper limbs: Secondary | ICD-10-CM | POA: Diagnosis not present

## 2019-10-01 DIAGNOSIS — G619 Inflammatory polyneuropathy, unspecified: Secondary | ICD-10-CM

## 2019-10-01 DIAGNOSIS — R202 Paresthesia of skin: Secondary | ICD-10-CM | POA: Diagnosis not present

## 2019-10-01 DIAGNOSIS — Z0289 Encounter for other administrative examinations: Secondary | ICD-10-CM

## 2019-10-01 NOTE — Patient Instructions (Signed)
Peripheral Neuropathy Peripheral neuropathy is a type of nerve damage. It affects nerves that carry signals between the spinal cord and the arms, legs, and the rest of the body (peripheral nerves). It does not affect nerves in the spinal cord or brain. In peripheral neuropathy, one nerve or a group of nerves may be damaged. Peripheral neuropathy is a broad category that includes many specific nerve disorders, like diabetic neuropathy, hereditary neuropathy, and carpal tunnel syndrome. What are the causes? This condition may be caused by:  Diabetes. This is the most common cause of peripheral neuropathy.  Nerve injury.  Pressure or stress on a nerve that lasts a long time.  Lack (deficiency) of B vitamins. This can result from alcoholism, poor diet, or a restricted diet.  Infections.  Autoimmune diseases, such as rheumatoid arthritis and systemic lupus erythematosus.  Nerve diseases that are passed from parent to child (inherited).  Some medicines, such as cancer medicines (chemotherapy), vaccines(inflammatory reaction)  Poisonous (toxic) substances, such as lead and mercury.  Too little blood flowing to the legs.  Kidney disease.  Thyroid disease. In some cases, the cause of this condition is not known. What are the signs or symptoms? Symptoms of this condition depend on which of your nerves is damaged. Common symptoms include:  Loss of feeling (numbness) in the feet, hands, or both.  Tingling in the feet, hands, or both.  Burning pain.  Very sensitive skin.  Weakness.  Not being able to move a part of the body (paralysis).  Muscle twitching.  Clumsiness or poor coordination.  Loss of balance.  Not being able to control your bladder.  Feeling dizzy.  Sexual problems. How is this diagnosed? Diagnosing and finding the cause of peripheral neuropathy can be difficult. Your health care provider will take your medical history and do a physical exam. A  neurological exam will also be done. This involves checking things that are affected by your brain, spinal cord, and nerves (nervous system). For example, your health care provider will check your reflexes, how you move, and what you can feel. You may have other tests, such as:  Blood tests.  Electromyogram (EMG) and nerve conduction tests. These tests check nerve function and how well the nerves are controlling the muscles.  Imaging tests, such as CT scans or MRI to rule out other causes of your symptoms.  Removing a small piece of nerve to be examined in a lab (nerve biopsy). This is rare.  Removing and examining a small amount of the fluid that surrounds the brain and spinal cord (lumbar puncture). This is rare. How is this treated? Treatment for this condition may involve:  Treating the underlying cause of the neuropathy, such as diabetes, kidney disease, or vitamin deficiencies.  Stopping medicines that can cause neuropathy, such as chemotherapy.  Medicine to relieve pain. Medicines may include: ? Prescription or over-the-counter pain medicine. ? Antiseizure medicine. ? Antidepressants. ? Pain-relieving patches that are applied to painful areas of skin.  Surgery to relieve pressure on a nerve or to destroy a nerve that is causing pain.  Physical therapy to help improve movement and balance.  Devices to help you move around (assistive devices). Follow these instructions at home: Medicines  Take over-the-counter and prescription medicines only as told by your health care provider. Do not take any other medicines without first asking your health care provider.  Do not drive or use heavy machinery while taking prescription pain medicine. Lifestyle   Do not use  any products that contain nicotine or tobacco, such as cigarettes and e-cigarettes. Smoking keeps blood from reaching damaged nerves. If you need help quitting, ask your health care provider.  Avoid or limit alcohol.  Too much alcohol can cause a vitamin B deficiency, and vitamin B is needed for healthy nerves.  Eat a healthy diet. This includes: ? Eating foods that are high in fiber, such as fresh fruits and vegetables, whole grains, and beans. ? Limiting foods that are high in fat and processed sugars, such as fried or sweet foods. General instructions   If you have diabetes, work closely with your health care provider to keep your blood sugar under control.  If you have numbness in your feet: ? Check every day for signs of injury or infection. Watch for redness, warmth, and swelling. ? Wear padded socks and comfortable shoes. These help protect your feet.  Develop a good support system. Living with peripheral neuropathy can be stressful. Consider talking with a mental health specialist or joining a support group.  Use assistive devices and attend physical therapy as told by your health care provider. This may include using a walker or a cane.  Keep all follow-up visits as told by your health care provider. This is important. Contact a health care provider if:  You have new signs or symptoms of peripheral neuropathy.  You are struggling emotionally from dealing with peripheral neuropathy.  Your pain is not well-controlled. Get help right away if:  You have an injury or infection that is not healing normally.  You develop new weakness in an arm or leg.  You fall frequently. Summary  Peripheral neuropathy is when the nerves in the arms, or legs are damaged, resulting in numbness, weakness, or pain.  There are many causes of peripheral neuropathy, including diabetes, pinched nerves, vitamin deficiencies, autoimmune disease, and hereditary conditions.  Diagnosing and finding the cause of peripheral neuropathy can be difficult. Your health care provider will take your medical history, do a physical exam, and do tests, including blood tests and nerve function tests.  Treatment involves  treating the underlying cause of the neuropathy and taking medicines to help control pain. Physical therapy and assistive devices may also help. This information is not intended to replace advice given to you by your health care provider. Make sure you discuss any questions you have with your health care provider. Document Revised: 01/18/2017 Document Reviewed: 04/16/2016 Elsevier Patient Education  2020 [employer]  Carpal Tunnel Syndrome  Carpal tunnel syndrome is a condition that causes pain in your hand and arm. The carpal tunnel is a narrow area located on the palm side of your wrist. Repeated wrist motion or certain diseases may cause swelling within the tunnel. This swelling pinches the main nerve in the wrist (median nerve). What are the causes? This condition may be caused by:  Repeated wrist motions.  Wrist injuries.  Arthritis.  A cyst or tumor in the carpal tunnel.  Fluid buildup during pregnancy. Sometimes the cause of this condition is not known. What increases the risk? The following factors may make you more likely to develop this condition:  Having a job, such as being a Haematologist, that requires you to repeatedly move your wrist in the same motion.  Being a woman.  Having certain conditions, such as: ? Diabetes. ? Obesity. ? An underactive thyroid (hypothyroidism). ? Kidney failure. What are the signs or symptoms? Symptoms of this condition include:  A tingling feeling in your  fingers, especially in your thumb, index, and middle fingers.  Tingling or numbness in your hand.  An aching feeling in your entire arm, especially when your wrist and elbow are bent for a long time.  Wrist pain that goes up your arm to your shoulder.  Pain that goes down into your palm or fingers.  A weak feeling in your hands. You may have trouble grabbing and holding items. Your symptoms may feel worse during the night. How is this diagnosed? This condition is  diagnosed with a medical history and physical exam. You may also have tests, including:  Electromyogram (EMG). This test measures electrical signals sent by your nerves into the muscles.  Nerve conduction study. This test measures how well electrical signals pass through your nerves.  Imaging tests, such as X-rays, ultrasound, and MRI. These tests check for possible causes of your condition. How is this treated? This condition may be treated with:  Lifestyle changes. It is important to stop or change the activity that caused your condition.  Doing exercise and activities to strengthen your muscles and bones (physical therapy).  Learning how to use your hand again after diagnosis (occupational therapy).  Medicines for pain and inflammation. This may include medicine that is injected into your wrist.  A wrist splint.  Surgery. Follow these instructions at home: If you have a splint:  Wear the splint as told by your health care provider. Remove it only as told by your health care provider.  Loosen the splint if your fingers tingle, become numb, or turn cold and blue.  Keep the splint clean.  If the splint is not waterproof: ? Do not let it get wet. ? Cover it with a watertight covering when you take a bath or shower. Managing pain, stiffness, and swelling   If directed, put ice on the painful area: ? If you have a removable splint, remove it as told by your health care provider. ? Put ice in a plastic bag. ? Place a towel between your skin and the bag. ? Leave the ice on for 20 minutes, 2-3 times per day. General instructions  Take over-the-counter and prescription medicines only as told by your health care provider.  Rest your wrist from any activity that may be causing your pain. If your condition is work related, talk with your employer about changes that can be made, such as getting a wrist pad to use while typing.  Do any exercises as told by your health care  provider, physical therapist, or occupational therapist.  Keep all follow-up visits as told by your health care provider. This is important. Contact a health care provider if:  You have new symptoms.  Your pain is not controlled with medicines.  Your symptoms get worse. Get help right away if:  You have severe numbness or tingling in your wrist or hand. Summary  Carpal tunnel syndrome is a condition that causes pain in your hand and arm.  It is usually caused by repeated wrist motions.  Lifestyle changes and medicines are used to treat carpal tunnel syndrome. Surgery may be recommended.  Follow your health care provider's instructions about wearing a splint, resting from activity, keeping follow-up visits, and calling for help. This information is not intended to replace advice given to you by your health care provider. Make sure you discuss any questions you have with your health care provider. Document Revised: 06/14/2017 Document Reviewed: 06/14/2017 Elsevier Patient Education  2020 [employer]  Cubital Tunnel Syndrome  Cubital  tunnel syndrome is a condition that causes pain and weakness of the forearm and hand. It happens when one of the nerves that runs along the inside of the elbow joint (ulnar nerve) becomes irritated. This condition is usually caused by repeated arm motions that are done during sports or work-related activities. What are the causes? This condition may be caused by:  Increased pressure on the ulnar nerve at the elbow, arm, or forearm. This can result from: ? Irritation caused by repeated elbow bending. ? Poorly healed elbow fractures. ? Tumors in the elbow. These are usually noncancerous (benign). ? Scar tissue that develops in the elbow after an injury. ? Bony growths (spurs) near the ulnar nerve.  Stretching of the nerve due to loose elbow ligaments.  Trauma to the nerve at the elbow. What increases the risk? The following factors may make you  more likely to develop this condition:  Doing manual labor that requires frequent bending of the elbow.  Playing sports that include repeated or strenuous throwing motions, such as baseball.  Playing contact sports, such as football or lacrosse.  Not warming up properly before activities.  Having diabetes.  Having an underactive thyroid (hypothyroidism). What are the signs or symptoms? Symptoms of this condition include:  Clumsiness or weakness of the hand.  Tenderness of the inner elbow.  Aching or soreness of the inner elbow, forearm, or fingers, especially the little finger or the ring finger.  Increased pain when forcing the elbow to bend.  Reduced control when throwing objects.  Tingling, numbness, or a burning feeling inside the forearm or in part of the hand or fingers, especially the little finger or the ring finger.  Sharp pains that shoot from the elbow down to the wrist and hand.  The inability to grip or pinch hard. How is this diagnosed? This condition is diagnosed based on:  Your symptoms and medical history. Your health care provider will also ask for details about any injury.  A physical exam. You may also have tests, including:  Electromyogram (EMG). This test measures electrical signals sent by your nerves into the muscles.  Nerve conduction study. This test measures how well electrical signals pass through your nerves.  Imaging tests, such as X-rays, ultrasound, and MRI. These tests check for possible causes of your condition. How is this treated? This condition may be treated by:  Stopping the activities that are causing your symptoms to get worse.  Icing and taking medicines to reduce pain and swelling.  Wearing a splint to prevent your elbow from bending, or wearing an elbow pad where the ulnar nerve is closest to the skin.  Working with a physical therapist in less severe cases. This may help to: ? Decrease your symptoms. ? Improve the  strength and range of motion of your elbow, forearm, and hand. If these treatments do not help, surgery may be needed. Follow these instructions at home: If you have a splint:  Wear the splint as told by your health care provider. Remove it only as told by your health care provider.  Loosen the splint if your fingers tingle, become numb, or turn cold and blue.  Keep the splint clean.  If the splint is not waterproof: ? Do not let it get wet. ? Cover it with a watertight covering when you take a bath or shower. Managing pain, stiffness, and swelling   If directed, put ice on the injured area: ? Put ice in a plastic bag. ? Place a towel  between your skin and the bag. ? Leave the ice on for 20 minutes, 2-3 times a day.  Move your fingers often to avoid stiffness and to lessen swelling.  Raise (elevate) the injured area above the level of your heart while you are sitting or lying down. General instructions  Take over-the-counter and prescription medicines only as told by your health care provider.  Do any exercise or physical therapy as told by your health care provider.  Do not drive or use heavy machinery while taking prescription pain medicine.  If you were given an elbow pad, wear it as told by your health care provider.  Keep all follow-up visits as told by your health care provider. This is important. Contact a health care provider if:  Your symptoms get worse.  Your symptoms do not get better with treatment.  You have new pain.  Your hand on the injured side feels numb or cold. Summary  Cubital tunnel syndrome is a condition that causes pain and weakness of the forearm and hand.  You are more likely to develop this condition if you do work or play sports that involve repeated arm movements.  This condition is often treated by stopping repetitive activities, applying ice, and using anti-inflammatory medicines.  In rare cases, surgery may be needed. This  information is not intended to replace advice given to you by your health care provider. Make sure you discuss any questions you have with your health care provider. Document Revised: 06/24/2017 Document Reviewed: 06/24/2017 Elsevier Patient Education  2020 [employer].

## 2019-10-01 NOTE — Progress Notes (Signed)
See procedure note.

## 2019-10-01 NOTE — Progress Notes (Signed)
Patient with acute onset paresthesias in the setting of covid vaccine likely due to autoimmune mechanism/inflammatory. Was admitted inpatient and treated with IVIG with improvement. 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 Patient still reports pain but she has appeared to stabilize and improve since being treated with IVIG. We will be continuing IVIG and sending patient to pain management. In addition she has bilateral Carpal Tunnel syndrome and right Ulnar neuropathy at the elbow and we will refer to Dr. Amanda Pea at [facility] for treatment. Discussed results with patient and management plans. Answered questions.  Orders Placed This Encounter  Procedures  . Ambulatory referral to Orthopedic Surgery   I spent 20 minutes of face-to-face and non-face-to-face time with patient on the  1. Inflammatory neuropathy (HCC)   2. Paresthesias   3. 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  This included previsit chart review, lab review, study review, order entry, electronic health record documentation, patient education on the different diagnostic and therapeutic options, counseling and coordination of care, risks and benefits of management, compliance, or risk factor reduction. This does not include time spent on emg/ncs.

## 2019-10-01 NOTE — Progress Notes (Signed)
     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  Summary: EMG/NCS was performed on the bilateral upper and right lower extremities.  The left median APB motor nerve showed delayed distal onset latency (4.6 ms, normal less than 4.4).  The right ulnar motor nerve showed decreased conduction velocity (20 m/s, above elbow to below elbow, normal greater than 49) with a 30 m/s drop across the elbow segment.  The right radial sensory nerve showed reduced amplitude (7 V, normal greater than 15).  The right sural sensory nerve showed reduced amplitude (5 V, normal greater than 6).  The right superficial peroneal sensory nerve showed reduced amplitude (2 V, normal greater than 6).  The right median sensory nerve showed delayed distal peak latency (3.7 ms, normal less than 3.4) and reduced amplitude (3 V, normal greater than 7.  The left median sensory nerve showed delayed distal peak latency (4.2 ms, normal less than 3.4) and reduced amplitude (3 V, normal greater than 10).  The right ulnar sensory nerve showed delayed distal peak latency (3.8 ms, normal less than 3.1) and reduced amplitude (3 V, normal greater than 5). .The right median/ulnar (palm) comparison nerve showed prolonged distal peak latency (Median Palm, 2.6 ms, N<2.2) and prolonged distal peak latency (Ulnar Palm, 2.4 ms, N<2.2). The right Ulnar F wave showed delayed latency(37.3ms, N<32). All remaining nerves (as indicated in the following tables) were within normal limits.  All muscles (as indicated in the following tables) were within normal limits.       Conclusion:  1. 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  2. 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  3. 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  Jaycub Noorani, M.D.  [facility] [address] [place], [place] [address] Tel: [phone] Fax: [phone]  Verbal informed consent was obtained from the patient, patient was informed of potential risk of procedure, including bruising, bleeding, hematoma formation, infection, muscle weakness, muscle pain, numbness, among others.         MNC    Nerve / Sites Muscle Latency Ref. Amplitude Ref. Rel Amp Segments Distance Velocity Ref. Area    ms ms mV mV %  cm m/s m/s mVms  R Median - APB     Wrist APB 4.2 ?4.4 5.9 ?4.0 100 Wrist - APB 7   22.5     Upper arm APB 8.7  5.8  97.9 Upper arm - Wrist 22 49 ?49 23.4  L Median - APB     Wrist APB 4.6 ?4.4 4.6 ?4.0 100 Wrist - APB 7   18.3     Upper arm APB 8.9  4.0  85.8 Upper arm - Wrist 22 51 ?49 15.8  R Ulnar - ADM     Wrist ADM 3.3 ?3.3 8.8 ?6.0 100 Wrist - ADM 7   29.0     B.Elbow ADM 7.1  8.4  95.4 B.Elbow - Wrist 19 50 ?49 28.0     A.Elbow ADM 12.2  6.8  81.5 A.Elbow - B.Elbow   10 20 ?49 26.0         A.Elbow - Wrist      R Peroneal - EDB     Ankle EDB 5.3 ?6.5 5.6 ?2.0 100 Ankle - EDB 9   19.9     Fib head EDB 11.3  5.2  93.3 Fib head - Ankle 26 44 ?44 19.0     Pop fossa EDB 13.6  4.7  91.2 Pop fossa - Fib head 10 44 ?44 17.6         Pop fossa - Ankle      R Tibial - AH     Ankle AH 4.5 ?5.8 11.3 ?4.0 100 Ankle - AH 9   30.3     Pop fossa AH 13.2  8.9  78.6 Pop fossa - Ankle 36 41 ?41 27.7               SNC    Nerve / Sites Rec. Site Peak Lat Ref.  Amp Ref. Segments Distance Peak Diff Ref.    ms ms V V  cm ms ms  R Radial - Anatomical snuff box (Forearm)     Forearm Wrist 2.6 ?2.9 7 ?15 Forearm - Wrist 10    R Sural - Ankle (Calf)     Calf Ankle 4.2  ?4.4 5 ?6 Calf - Ankle 14    R Superficial peroneal - Ankle     Lat leg Ankle 4.3 ?4.4 2 ?6 Lat leg - Ankle 14    R Median, Ulnar - Transcarpal comparison     Median Palm Wrist 2.6 ?2.2 46 ?35 Median Palm - Wrist 8       Ulnar Palm Wrist 2.4 ?2.2 3 ?12 Ulnar Palm - Wrist 8          Median Palm - Ulnar Palm  0.2 ?0.4  R Median - Orthodromic (Dig II, Mid palm)     Dig II Wrist 3.7 ?3.4 3 ?10 Dig II - Wrist 13    L Median - Orthodromic (Dig II, Mid palm)     Dig II Wrist 4.2 ?3.4 3 ?10 Dig II - Wrist 13    R Ulnar - Orthodromic, (Dig V, Mid palm)     Dig V Wrist 3.8 ?3.1 3 ?5 Dig V - Wrist 11                     F  Wave    Nerve F Lat Ref.   ms ms  R Peroneal - EDB 55.9 ?56.0  R Tibial - AH 55.0 ?56.0  R Median - APB 29.1 ?31.0  R Ulnar - ADM 37.3 ?32.0             EMG Summary Table    Spontaneous MUAP Recruitment  Muscle IA Fib PSW Fasc Other Amp Dur. Poly Pattern  R. Vastus medialis Normal None None None _______ Normal Normal Normal Normal  R. Tibialis anterior Normal None None None _______ Normal Normal Normal Normal  R. Gastrocnemius (Medial head) Normal None None None _______ Normal Normal Normal Normal  R. Extensor hallucis longus Normal None None None _______ Normal Normal Normal Normal  R. Abductor hallucis Normal None None None _______ Normal Normal Normal Normal  L. Deltoid Normal None None None _______ Normal Normal Normal Normal  R. Deltoid Normal None None None _______ Normal Normal Normal Normal  L. Triceps brachii Normal None None None _______ Normal Normal Normal Normal  R. Triceps brachii Normal None None   None _______ Normal Normal Normal Normal  L. Pronator teres Normal None None None _______ Normal Normal Normal Normal  R. Pronator teres Normal None None None _______ Normal Normal Normal Normal  L. First dorsal interosseous Normal None None None _______ Normal Normal Normal Normal  R. First dorsal interosseous Normal None None None _______ Normal Normal Normal  Normal  L. Flexor digitorum profundus (Ulnar) Normal None None None _______ Normal Normal Normal Normal  R. Flexor digitorum profundus (Ulnar) Normal None None None _______ Normal Normal Normal Normal  L. Opponens pollicis Normal None None None _______ Normal Normal Normal Normal  R. Opponens pollicis Normal None None None _______ Normal Normal Normal Normal  L. Cervical paraspinals (low) Normal None None None _______ Normal Normal Normal Normal  R. Cervical paraspinals (low) Normal None None None _______ Normal Normal Normal Normal      

## 2019-10-04 NOTE — Procedures (Signed)
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  Summary: EMG/NCS was performed on the bilateral upper and right lower extremities.  The left median APB motor nerve showed delayed distal onset latency (4.6 ms, normal less than 4.4).  The right ulnar motor nerve showed decreased conduction velocity (20 m/s, above elbow to below elbow, normal greater than 49) with a 30 m/s drop across the elbow segment.  The right radial sensory nerve showed reduced amplitude (7 V, normal greater than 15).  The right sural sensory nerve showed reduced amplitude (5 V, normal greater than 6).  The right superficial peroneal sensory nerve showed reduced amplitude (2 V, normal greater than 6).  The right median sensory nerve showed delayed distal peak latency (3.7 ms, normal less than 3.4) and reduced amplitude (3 V, normal greater than 7.  The left median sensory nerve showed delayed distal peak latency (4.2 ms, normal less than 3.4) and reduced amplitude (3 V, normal greater than 10).  The right ulnar sensory nerve showed delayed distal peak latency (3.8 ms, normal less than 3.1) and reduced amplitude (3 V, normal greater than 5). .The right median/ulnar (palm) comparison nerve showed prolonged distal peak latency (Median Palm, 2.6 ms, N<2.2) and prolonged distal peak latency (Ulnar Palm, 2.4 ms, N<2.2). The right Ulnar F wave showed delayed latency(37.74ms, N<32). All remaining nerves (as indicated in the following tables) were within normal limits.  All muscles (as indicated in the following tables) were within normal limits.       Conclusion:  1. 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  2. 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  3. 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  Naomie Dean, M.D.  [facility] [address] [place], [place] [address] Tel: [phone] Fax: [phone]  Verbal informed consent was obtained from the patient, patient was informed of potential risk of procedure, including bruising, bleeding, hematoma formation, infection, muscle weakness, muscle pain, numbness, among others.         MNC    Nerve / Sites Muscle Latency Ref. Amplitude Ref. Rel Amp Segments Distance Velocity Ref. Area    ms ms mV mV %  cm m/s m/s mVms  R Median - APB     Wrist APB 4.2 ?4.4 5.9 ?4.0 100 Wrist - APB 7   22.5     Upper arm APB 8.7  5.8  97.9 Upper arm - Wrist 22 49 ?49 23.4  L Median - APB     Wrist APB 4.6 ?4.4 4.6 ?4.0 100 Wrist - APB 7   18.3     Upper arm APB 8.9  4.0  85.8 Upper arm - Wrist 22 51 ?49 15.8  R Ulnar - ADM     Wrist ADM 3.3 ?3.3 8.8 ?6.0 100 Wrist - ADM 7   29.0     B.Elbow ADM 7.1  8.4  95.4 B.Elbow - Wrist 19 50 ?49 28.0     A.Elbow ADM 12.2  6.8  81.5 A.Elbow - B.Elbow  10 20 ?49 26.0         A.Elbow - Wrist      R Peroneal - EDB     Ankle EDB 5.3 ?6.5 5.6 ?2.0 100 Ankle - EDB 9   19.9     Fib head EDB 11.3  5.2  93.3 Fib head - Ankle 26 44 ?44 19.0     Pop fossa EDB 13.6  4.7  91.2 Pop fossa - Fib head 10 44 ?44 17.6         Pop fossa - Ankle      R Tibial - AH     Ankle AH 4.5 ?5.8 11.3 ?4.0 100 Ankle - AH 9   30.3     Pop fossa AH 13.2  8.9  78.6 Pop fossa - Ankle 36 41 ?41 27.7               SNC    Nerve / Sites Rec. Site Peak Lat Ref.  Amp Ref. Segments Distance Peak Diff Ref.    ms ms V V  cm ms ms  R Radial - Anatomical snuff box (Forearm)     Forearm Wrist 2.6 ?2.9 7 ?15 Forearm - Wrist 10    R Sural - Ankle (Calf)     Calf Ankle 4.2  ?4.4 5 ?6 Calf - Ankle 14    R Superficial peroneal - Ankle     Lat leg Ankle 4.3 ?4.4 2 ?6 Lat leg - Ankle 14    R Median, Ulnar - Transcarpal comparison     Median Palm Wrist 2.6 ?2.2 46 ?35 Median Palm - Wrist 8       Ulnar Palm Wrist 2.4 ?2.2 3 ?12 Ulnar Palm - Wrist 8          Median Palm - Ulnar Palm  0.2 ?0.4  R Median - Orthodromic (Dig II, Mid palm)     Dig II Wrist 3.7 ?3.4 3 ?10 Dig II - Wrist 13    L Median - Orthodromic (Dig II, Mid palm)     Dig II Wrist 4.2 ?3.4 3 ?10 Dig II - Wrist 13    R Ulnar - Orthodromic, (Dig V, Mid palm)     Dig V Wrist 3.8 ?3.1 3 ?5 Dig V - Wrist 65                     F  Wave    Nerve F Lat Ref.   ms ms  R Peroneal - EDB 55.9 ?56.0  R Tibial - AH 55.0 ?56.0  R Median - APB 29.1 ?31.0  R Ulnar - ADM 37.3 ?32.0             EMG Summary Table    Spontaneous MUAP Recruitment  Muscle IA Fib PSW Fasc Other Amp Dur. Poly Pattern  R. Vastus medialis Normal None None None _______ Normal Normal Normal Normal  R. Tibialis anterior Normal None None None _______ Normal Normal Normal Normal  R. Gastrocnemius (Medial head) Normal None None None _______ Normal Normal Normal Normal  R. Extensor hallucis longus Normal None None None _______ Normal Normal Normal Normal  R. Abductor hallucis Normal None None None _______ Normal Normal Normal Normal  L. Deltoid Normal None None None _______ Normal Normal Normal Normal  R. Deltoid Normal None None None _______ Normal Normal Normal Normal  L. Triceps brachii Normal None None None _______ Normal Normal Normal Normal  R. Triceps brachii Normal None None  None _______ Normal Normal Normal Normal  L. Pronator teres Normal None None None _______ Normal Normal Normal Normal  R. Pronator teres Normal None None None _______ Normal Normal Normal Normal  L. First dorsal interosseous Normal None None None _______ Normal Normal Normal Normal  R. First dorsal interosseous Normal None None None _______ Normal Normal Normal  Normal  L. Flexor digitorum profundus (Ulnar) Normal None None None _______ Normal Normal Normal Normal  R. Flexor digitorum profundus (Ulnar) Normal None None None _______ Normal Normal Normal Normal  L. Opponens pollicis Normal None None None _______ Normal Normal Normal Normal  R. Opponens pollicis Normal None None None _______ Normal Normal Normal Normal  L. Cervical paraspinals (low) Normal None None None _______ Normal Normal Normal Normal  R. Cervical paraspinals (low) Normal None None None _______ Normal Normal Normal Normal

## 2019-10-05 ENCOUNTER — Encounter: Payer: Self-pay | Admitting: Physical Medicine & Rehabilitation

## 2019-10-05 ENCOUNTER — Telehealth: Payer: Self-pay | Admitting: Neurology

## 2019-10-05 NOTE — Telephone Encounter (Signed)
Dr [name] sent the pt a mychart message today.

## 2019-10-05 NOTE — Telephone Encounter (Signed)
Pt called wanting to speak to the RN regarding the pain and the shakes that she is experiencing. Pt would like to know how long this will last. Please advise.

## 2019-10-06 ENCOUNTER — Telehealth: Payer: Self-pay | Admitting: *Deleted

## 2019-10-06 ENCOUNTER — Encounter: Payer: Self-pay | Admitting: *Deleted

## 2019-10-06 NOTE — Telephone Encounter (Signed)
Per Dr [name], start process for IVIG.  Orders:   Start IVIG 2 g/kg over 1-2 days followed by maintenance dose of 1 g/kg over 1-2 days every 4 weeks via IV. Based on actual body weight.   Pre-medication: Tylenol 650 mg PO x1  Benadryl 50 PO x 1   I have printed out office notes, EMG results, labs, insurance and demographic information, hospital discharge summary and neuro consult note to be included with IVIG orders for PA purposes.

## 2019-10-06 NOTE — Telephone Encounter (Signed)
Orders signed by Dr [name]. Orders and supporting information faxed to [facility]. Received a receipt of confirmation.

## 2019-10-22 ENCOUNTER — Encounter: Payer: 59 | Attending: Physical Medicine & Rehabilitation | Admitting: Physical Medicine & Rehabilitation

## 2019-10-22 ENCOUNTER — Telehealth: Payer: Self-pay | Admitting: Neurology

## 2019-10-22 NOTE — Telephone Encounter (Signed)
Per [facility] the patient doesn't want to schedule at this time. Has a lot going on and will call us when she's ready.

## 2019-10-27 ENCOUNTER — Telehealth: Payer: Self-pay

## 2019-10-27 NOTE — Telephone Encounter (Signed)
Dr. Frances Furbish graciously signed the IVIG orders and I faxed them back to [facility] @ [phone]. Received a receipt of confirmation.

## 2019-10-27 NOTE — Telephone Encounter (Signed)
Noted will see if Dr Frances Furbish (work in physician) will sign orders.

## 2019-10-27 NOTE — Telephone Encounter (Signed)
Clarifying orders for the patient's IVIG have been faxed (and emailed) to you. Porfirio Mylar just needs them signed and sent back as soon as possible. She is aware that Dr. [name] is out of the office and said that it is ok for another doctor to sign them.   [phone] Sarajane Marek, [facility]

## 2019-11-06 NOTE — Telephone Encounter (Signed)
Late entry from 11/05/19. We received orders for IVIG from [facility], the company that will be administering the IVIG. Orders signed and faxed back to [facility]. Received a receipt of confirmation.  Gammaguard 110 g divided over 2 days then 55 g over 1-2 days every 4 weeks via IV pump .

## 2019-11-17 ENCOUNTER — Inpatient Hospital Stay (HOSPITAL_COMMUNITY)
Admission: EM | Admit: 2019-11-17 | Discharge: 2019-11-19 | DRG: 683 | Disposition: A | Discharge status: Home / Self Care | Payer: 59 | Attending: Internal Medicine | Admitting: Internal Medicine

## 2019-11-17 ENCOUNTER — Observation Stay (HOSPITAL_COMMUNITY): Payer: 59

## 2019-11-17 ENCOUNTER — Other Ambulatory Visit: Payer: Self-pay

## 2019-11-17 ENCOUNTER — Encounter (HOSPITAL_COMMUNITY): Payer: Self-pay

## 2019-11-17 DIAGNOSIS — Z8249 Family history of ischemic heart disease and other diseases of the circulatory system: Secondary | ICD-10-CM

## 2019-11-17 DIAGNOSIS — K219 Gastro-esophageal reflux disease without esophagitis: Secondary | ICD-10-CM | POA: Diagnosis present

## 2019-11-17 DIAGNOSIS — N179 Acute kidney failure, unspecified: Secondary | ICD-10-CM | POA: Diagnosis not present

## 2019-11-17 DIAGNOSIS — G608 Other hereditary and idiopathic neuropathies: Secondary | ICD-10-CM | POA: Diagnosis present

## 2019-11-17 DIAGNOSIS — G629 Polyneuropathy, unspecified: Secondary | ICD-10-CM | POA: Diagnosis present

## 2019-11-17 DIAGNOSIS — Z818 Family history of other mental and behavioral disorders: Secondary | ICD-10-CM

## 2019-11-17 DIAGNOSIS — I2510 Atherosclerotic heart disease of native coronary artery without angina pectoris: Secondary | ICD-10-CM | POA: Diagnosis not present

## 2019-11-17 DIAGNOSIS — S92902A Unspecified fracture of left foot, initial encounter for closed fracture: Secondary | ICD-10-CM | POA: Diagnosis present

## 2019-11-17 DIAGNOSIS — I959 Hypotension, unspecified: Secondary | ICD-10-CM

## 2019-11-17 DIAGNOSIS — E860 Dehydration: Secondary | ICD-10-CM | POA: Diagnosis present

## 2019-11-17 DIAGNOSIS — Z955 Presence of coronary angioplasty implant and graft: Secondary | ICD-10-CM

## 2019-11-17 DIAGNOSIS — Z888 Allergy status to other drugs, medicaments and biological substances status: Secondary | ICD-10-CM

## 2019-11-17 DIAGNOSIS — F329 Major depressive disorder, single episode, unspecified: Secondary | ICD-10-CM | POA: Diagnosis present

## 2019-11-17 DIAGNOSIS — Z20822 Contact with and (suspected) exposure to covid-19: Secondary | ICD-10-CM | POA: Diagnosis present

## 2019-11-17 DIAGNOSIS — E871 Hypo-osmolality and hyponatremia: Secondary | ICD-10-CM | POA: Diagnosis present

## 2019-11-17 DIAGNOSIS — N390 Urinary tract infection, site not specified: Secondary | ICD-10-CM

## 2019-11-17 DIAGNOSIS — B9620 Unspecified Escherichia coli [E. coli] as the cause of diseases classified elsewhere: Secondary | ICD-10-CM | POA: Diagnosis present

## 2019-11-17 DIAGNOSIS — D509 Iron deficiency anemia, unspecified: Secondary | ICD-10-CM | POA: Diagnosis present

## 2019-11-17 DIAGNOSIS — F17210 Nicotine dependence, cigarettes, uncomplicated: Secondary | ICD-10-CM | POA: Diagnosis present

## 2019-11-17 DIAGNOSIS — F988 Other specified behavioral and emotional disorders with onset usually occurring in childhood and adolescence: Secondary | ICD-10-CM | POA: Diagnosis present

## 2019-11-17 DIAGNOSIS — F1010 Alcohol abuse, uncomplicated: Secondary | ICD-10-CM | POA: Diagnosis present

## 2019-11-17 DIAGNOSIS — Z7982 Long term (current) use of aspirin: Secondary | ICD-10-CM

## 2019-11-17 DIAGNOSIS — I10 Essential (primary) hypertension: Secondary | ICD-10-CM | POA: Diagnosis present

## 2019-11-17 DIAGNOSIS — Z881 Allergy status to other antibiotic agents status: Secondary | ICD-10-CM

## 2019-11-17 DIAGNOSIS — M3500 Sicca syndrome, unspecified: Secondary | ICD-10-CM | POA: Diagnosis present

## 2019-11-17 DIAGNOSIS — X58XXXA Exposure to other specified factors, initial encounter: Secondary | ICD-10-CM | POA: Diagnosis present

## 2019-11-17 DIAGNOSIS — D638 Anemia in other chronic diseases classified elsewhere: Secondary | ICD-10-CM | POA: Diagnosis present

## 2019-11-17 DIAGNOSIS — E782 Mixed hyperlipidemia: Secondary | ICD-10-CM | POA: Diagnosis present

## 2019-11-17 DIAGNOSIS — I252 Old myocardial infarction: Secondary | ICD-10-CM

## 2019-11-17 DIAGNOSIS — Z79899 Other long term (current) drug therapy: Secondary | ICD-10-CM

## 2019-11-17 DIAGNOSIS — Z87440 Personal history of urinary (tract) infections: Secondary | ICD-10-CM

## 2019-11-17 DIAGNOSIS — N3000 Acute cystitis without hematuria: Secondary | ICD-10-CM | POA: Diagnosis present

## 2019-11-17 DIAGNOSIS — Z90710 Acquired absence of both cervix and uterus: Secondary | ICD-10-CM

## 2019-11-17 DIAGNOSIS — Z885 Allergy status to narcotic agent status: Secondary | ICD-10-CM

## 2019-11-17 LAB — RESPIRATORY PANEL BY RT PCR (FLU A&B, COVID)
Influenza A by PCR: NEGATIVE
Influenza B by PCR: NEGATIVE
SARS Coronavirus 2 by RT PCR: NEGATIVE

## 2019-11-17 LAB — CBC WITH DIFFERENTIAL/PLATELET
Abs Immature Granulocytes: 0.04 10*3/uL (ref 0.00–0.07)
Basophils Absolute: 0.1 10*3/uL (ref 0.0–0.1)
Basophils Relative: 1 %
Eosinophils Absolute: 0.1 10*3/uL (ref 0.0–0.5)
Eosinophils Relative: 1 %
HCT: 31.9 % — ABNORMAL LOW (ref 36.0–46.0)
Hemoglobin: 11.1 g/dL — ABNORMAL LOW (ref 12.0–15.0)
Immature Granulocytes: 0 %
Lymphocytes Relative: 24 %
Lymphs Abs: 2.6 10*3/uL (ref 0.7–4.0)
MCH: 37.1 pg — ABNORMAL HIGH (ref 26.0–34.0)
MCHC: 34.8 g/dL (ref 30.0–36.0)
MCV: 106.7 fL — ABNORMAL HIGH (ref 80.0–100.0)
Monocytes Absolute: 0.7 10*3/uL (ref 0.1–1.0)
Monocytes Relative: 6 %
Neutro Abs: 7.2 10*3/uL (ref 1.7–7.7)
Neutrophils Relative %: 68 %
Platelets: 350 10*3/uL (ref 150–400)
RBC: 2.99 MIL/uL — ABNORMAL LOW (ref 3.87–5.11)
RDW: 19.1 % — ABNORMAL HIGH (ref 11.5–15.5)
WBC: 10.6 10*3/uL — ABNORMAL HIGH (ref 4.0–10.5)
nRBC: 0 % (ref 0.0–0.2)

## 2019-11-17 LAB — LACTIC ACID, PLASMA: Lactic Acid, Venous: 0.9 mmol/L (ref 0.5–1.9)

## 2019-11-17 LAB — COMPREHENSIVE METABOLIC PANEL
ALT: 29 U/L (ref 0–44)
AST: 49 U/L — ABNORMAL HIGH (ref 15–41)
Albumin: 2.9 g/dL — ABNORMAL LOW (ref 3.5–5.0)
Alkaline Phosphatase: 92 U/L (ref 38–126)
Anion gap: 9 (ref 5–15)
BUN: 30 mg/dL — ABNORMAL HIGH (ref 6–20)
CO2: 26 mmol/L (ref 22–32)
Calcium: 8.7 mg/dL — ABNORMAL LOW (ref 8.9–10.3)
Chloride: 97 mmol/L — ABNORMAL LOW (ref 98–111)
Creatinine, Ser: 2.08 mg/dL — ABNORMAL HIGH (ref 0.44–1.00)
GFR calc Af Amer: 31 mL/min — ABNORMAL LOW (ref >60)
GFR calc non Af Amer: 27 mL/min — ABNORMAL LOW (ref >60)
Glucose, Bld: 96 mg/dL (ref 70–99)
Potassium: 4.9 mmol/L (ref 3.5–5.1)
Sodium: 132 mmol/L — ABNORMAL LOW (ref 135–145)
Total Bilirubin: 0.8 mg/dL (ref 0.3–1.2)
Total Protein: 7.2 g/dL (ref 6.5–8.1)

## 2019-11-17 LAB — URINALYSIS, ROUTINE W REFLEX MICROSCOPIC
Bilirubin Urine: NEGATIVE
Glucose, UA: 50 mg/dL — AB
Hgb urine dipstick: NEGATIVE
Ketones, ur: NEGATIVE mg/dL
Nitrite: POSITIVE — AB
Protein, ur: NEGATIVE mg/dL
Specific Gravity, Urine: 1.006 (ref 1.005–1.030)
WBC, UA: >50 WBC/hpf — ABNORMAL HIGH (ref 0–5)
pH: 6 (ref 5.0–8.0)

## 2019-11-17 MED ORDER — PILOCARPINE HCL 5 MG PO TABS
5.0000 mg | ORAL_TABLET | Freq: Three times a day (TID) | ORAL | Status: DC
  Administered 2019-11-18 – 2019-11-19 (×6): 5 mg via ORAL
  Filled 2019-11-17 (×7): qty 1

## 2019-11-17 MED ORDER — PANTOPRAZOLE SODIUM 20 MG PO TBEC
20.0000 mg | DELAYED_RELEASE_TABLET | Freq: Two times a day (BID) | ORAL | Status: DC
  Administered 2019-11-18 – 2019-11-19 (×4): 20 mg via ORAL
  Filled 2019-11-17 (×4): qty 1

## 2019-11-17 MED ORDER — FOLIC ACID 1 MG PO TABS
1.0000 mg | ORAL_TABLET | Freq: Every day | ORAL | Status: DC
  Administered 2019-11-18 – 2019-11-19 (×2): 1 mg via ORAL
  Filled 2019-11-17 (×2): qty 1

## 2019-11-17 MED ORDER — METOPROLOL TARTRATE 25 MG PO TABS
25.0000 mg | ORAL_TABLET | Freq: Two times a day (BID) | ORAL | Status: DC
  Administered 2019-11-18: 25 mg via ORAL
  Filled 2019-11-17: qty 1

## 2019-11-17 MED ORDER — LORAZEPAM 2 MG/ML IJ SOLN: 1.0000 mg | INTRAMUSCULAR | Status: DC | PRN

## 2019-11-17 MED ORDER — ONDANSETRON HCL 4 MG/2ML IJ SOLN
4.0000 mg | Freq: Four times a day (QID) | INTRAMUSCULAR | Status: DC | PRN
  Administered 2019-11-18 – 2019-11-19 (×2): 4 mg via INTRAVENOUS
  Filled 2019-11-17 (×2): qty 2

## 2019-11-17 MED ORDER — ZOLPIDEM TARTRATE 5 MG PO TABS
5.0000 mg | ORAL_TABLET | Freq: Every evening | ORAL | Status: DC | PRN
  Administered 2019-11-18: 5 mg via ORAL
  Filled 2019-11-17: qty 1

## 2019-11-17 MED ORDER — LACTATED RINGERS IV SOLN: INTRAVENOUS | Status: AC

## 2019-11-17 MED ORDER — SODIUM CHLORIDE 0.9 % IV SOLN
1.0000 g | INTRAVENOUS | Status: DC
  Administered 2019-11-18: 1 g via INTRAVENOUS
  Filled 2019-11-17: qty 1

## 2019-11-17 MED ORDER — ENOXAPARIN SODIUM 30 MG/0.3ML ~~LOC~~ SOLN
30.0000 mg | SUBCUTANEOUS | Status: DC
  Filled 2019-11-17 (×2): qty 0.3

## 2019-11-17 MED ORDER — EZETIMIBE 10 MG PO TABS
10.0000 mg | ORAL_TABLET | Freq: Every day | ORAL | Status: DC
  Administered 2019-11-18 – 2019-11-19 (×2): 10 mg via ORAL
  Filled 2019-11-17 (×2): qty 1

## 2019-11-17 MED ORDER — HYDRALAZINE HCL 20 MG/ML IJ SOLN: 10.0000 mg | Freq: Four times a day (QID) | INTRAMUSCULAR | Status: DC | PRN

## 2019-11-17 MED ORDER — LORAZEPAM 1 MG PO TABS
1.0000 mg | ORAL_TABLET | ORAL | Status: DC | PRN
  Administered 2019-11-18 – 2019-11-19 (×2): 1 mg via ORAL
  Filled 2019-11-17 (×2): qty 1

## 2019-11-17 MED ORDER — ACETAMINOPHEN-CODEINE #3 300-30 MG PO TABS
1.0000 | ORAL_TABLET | Freq: Three times a day (TID) | ORAL | Status: DC | PRN
  Administered 2019-11-18 – 2019-11-19 (×3): 1 via ORAL
  Filled 2019-11-17 (×5): qty 1

## 2019-11-17 MED ORDER — ONDANSETRON HCL 4 MG PO TABS: 4.0000 mg | ORAL_TABLET | Freq: Four times a day (QID) | ORAL | Status: DC | PRN

## 2019-11-17 MED ORDER — SODIUM CHLORIDE 0.9 % IV BOLUS
1000.0000 mL | Freq: Once | INTRAVENOUS | Status: AC
  Administered 2019-11-17: 1000 mL via INTRAVENOUS

## 2019-11-17 MED ORDER — THIAMINE HCL 100 MG PO TABS
100.0000 mg | ORAL_TABLET | Freq: Every day | ORAL | Status: DC
  Administered 2019-11-18 – 2019-11-19 (×2): 100 mg via ORAL
  Filled 2019-11-17 (×2): qty 1

## 2019-11-17 MED ORDER — THIAMINE HCL 100 MG/ML IJ SOLN: 100.0000 mg | Freq: Every day | INTRAMUSCULAR | Status: DC

## 2019-11-17 MED ORDER — ACETAMINOPHEN 650 MG RE SUPP: 650.0000 mg | Freq: Four times a day (QID) | RECTAL | Status: DC | PRN

## 2019-11-17 MED ORDER — ASPIRIN EC 81 MG PO TBEC
81.0000 mg | DELAYED_RELEASE_TABLET | Freq: Every day | ORAL | Status: DC
  Administered 2019-11-18 – 2019-11-19 (×2): 81 mg via ORAL
  Filled 2019-11-17: qty 1

## 2019-11-17 MED ORDER — SODIUM CHLORIDE 0.9 % IV SOLN
1.0000 g | Freq: Once | INTRAVENOUS | Status: AC
  Administered 2019-11-17: 1 g via INTRAVENOUS
  Filled 2019-11-17: qty 10

## 2019-11-17 MED ORDER — POLYETHYLENE GLYCOL 3350 17 G PO PACK: 17.0000 g | PACK | Freq: Every day | ORAL | Status: DC | PRN

## 2019-11-17 MED ORDER — ACETAMINOPHEN 325 MG PO TABS
650.0000 mg | ORAL_TABLET | Freq: Four times a day (QID) | ORAL | Status: DC | PRN
  Filled 2019-11-17: qty 2

## 2019-11-17 MED ORDER — ADULT MULTIVITAMIN W/MINERALS CH
1.0000 | ORAL_TABLET | Freq: Every day | ORAL | Status: DC
  Administered 2019-11-18 – 2019-11-19 (×2): 1 via ORAL
  Filled 2019-11-17 (×2): qty 1

## 2019-11-17 MED ORDER — AMPHETAMINE-DEXTROAMPHETAMINE 10 MG PO TABS
15.0000 mg | ORAL_TABLET | Freq: Two times a day (BID) | ORAL | Status: DC
  Filled 2019-11-17 (×2): qty 2

## 2019-11-17 NOTE — ED Provider Notes (Signed)
3:50 PM Care assumed from Dr. Jacqulyn Bath.  At time of transfer care, patient is awaiting results of urinalysis, CMP, and EKG.  Plan of care is to reassess patient after work-up to determine disposition.  Patient was sent in for evaluation of possible AKI.  5:13 PM Work-up is begun to return.  Her kidney function is worse than yesterday up to 2.08 up from 1.9 yesterday and normal before that.  She also has evidence of urinary tract infection with nitrites, leukocytes, greater than 50 whites, and bacteria.  Patient is denying urinary symptoms, flank pain, or abdominal pain however I am concerned about this is the source of some of her problems.  Patient is still having soft blood pressures with pressures between 70s and low 100 systolic.  Patient still feels fatigued.  Given the leukocytosis, AKI, UTI, and hypotension, I had a discussion with the patient and she does not feel safe going home.  We will give IV antibiotics, more fluids, and get blood cultures/lactic acid and get her admitted to the medicine service for further management.       Geniva Lohnes, Canary Brim, MD 11/17/19 2226

## 2019-11-17 NOTE — H&P (Addendum)
History and Physical    Lynn Harvey ZOX:[id number] DOB: 12/02/1965 DOA: 11/17/2019  PCP: [name], MD  Patient coming from: Home   Chief Complaint:  Chief Complaint  Patient presents with  . Abnormal Lab     HPI:    54 year old female with past medical history of Sjogren disease, gastroesophageal reflux disease, coronary artery disease (NSTEMI 2015 S/P DES to LAD), hyperlipidemia, hypertension, nicotine dependence, alcohol abuse and recent diagnosis of acute small fiber sensory neuropathy 8/21 who presents to [facility] emergency department at the direction of her primary care provider due to abnormal labs.  Patient explains that since her recent diagnosis of small fiber sensory neuropathy in August requiring hospitalization she has continued to experience tingling and pain of the extremities along with generalized weakness.  Patient blames this diagnosis on her mRNA vaccination completed in June.    Patient states that she has followed with Dr. [name] with neurology in the outpatient setting who currently has her on IVIG infusions.  Her first infusion was the first week of September.  Patient states that since his infusion she has felt nausea and generalized weakness.  Along with this nausea generalized weakness she has had progressively worsening appetite compared to her baseline.  Patient denies any fevers, dysuria, low back pain, diarrhea, abdominal pain, sick contacts, shortness of breath, cough or confirmed contact with COVID-19 infection.  Approximately 2 to 3 weeks ago, patient experienced an episode of loss of consciousness while attempting to take a bath.  Patient denies any symptoms prior to the sudden loss of consciousness and quickly regained consciousness.  Patient denies head trauma.  EMS did respond to that time but patient declined to come into the hospital.  In the weeks that followed, patient's generalized weakness and poor appetite persisted and  worsened.  Patient also complains of associated lightheadedness on occasion without further episodes of loss of consciousness.  Patient eventually did see her primary care provider with the above-mentioned complaints.  Her primary care provider sent her for blood work and upon finding the patient was suffering from acute kidney injury instructed the patient to go to the emergency department for evaluation.  Upon evaluation in the emergency department patient has been confirmed to be suffering from acute kidney injury with creatinine of 2.08 compared to baseline of 0.68.  Patient is also been found to have a leukocytosis of 10.6 with urinalysis suggestive of urinary tract infection.  The hospitalist group has been called to assess patient for admission to the hospital for management of acute kidney injury and complicating urinary tract infection.  Review of Systems:   Review of Systems  Constitutional: Positive for malaise/fatigue.  Gastrointestinal: Positive for nausea.  Neurological: Positive for dizziness, sensory change, loss of consciousness and weakness.  All other systems reviewed and are negative.   Past Medical History:  Diagnosis Date  . Acute sensory neuropathy 09/18/2019  . ADD (attention deficit disorder)   . Allergy   . Anemia   . Anxiety   . Coronary artery disease    a. s/p NSTEMI in 2015 with angioplasty alone to mid-LAD, repeat cath within the same month showing a patent site  . Depression   . Depression   . Fatty liver   . GAD (generalized anxiety disorder)   . GERD (gastroesophageal reflux disease)   . Gestational diabetes   . History of heart attack   . Hyperlipidemia   . Hypertension   . Insomnia   . Lupus (HCC)   .  Milia   . Myocardial infarction (HCC) 06/2013  . Rectal abnormality   . Sicca syndrome (HCC)   . Sjogren's syndrome (HCC)   . Telangiectasia   . Vitamin D deficiency     Past Surgical History:  Procedure Laterality Date  . ABDOMINAL  HYSTERECTOMY    . CHOLECYSTECTOMY  08/2018  . COLON SURGERY     Flexible Sigmoidoscopy  . COLONOSCOPY N/A 01/08/2014   Procedure: COLONOSCOPY;  Surgeon: Theda Belfast, MD;  Location: [facility] ENDOSCOPY;  Service: Endoscopy;  Laterality: N/A;  . ESOPHAGOGASTRODUODENOSCOPY N/A 01/08/2014   Procedure: ESOPHAGOGASTRODUODENOSCOPY (EGD);  Surgeon: Theda Belfast, MD;  Location: [facility];  Service: Endoscopy;  Laterality: N/A;  . LEFT HEART CATHETERIZATION WITH CORONARY ANGIOGRAM N/A 06/24/2013   Procedure: LEFT HEART CATHETERIZATION WITH CORONARY ANGIOGRAM;  Surgeon: [name], MD;  Location: [facility] CATH LAB;  Service: Cardiovascular;  Laterality: N/A;  . LEFT HEART CATHETERIZATION WITH CORONARY ANGIOGRAM N/A 07/17/2013   Procedure: LEFT HEART CATHETERIZATION WITH CORONARY ANGIOGRAM;  Surgeon: Kathleene Hazel, MD;  Location: [facility] CATH LAB;  Service: Cardiovascular;  Laterality: N/A;  . TONSILECTOMY, ADENOIDECTOMY, BILATERAL MYRINGOTOMY AND TUBES    . WRIST SURGERY  1987   RIGHT; bone from forearm grafted to wrist     reports that she has been smoking cigarettes. She started smoking about 36 years ago. She has a 30.00 pack-year smoking history. She has never used smokeless tobacco. She reports current alcohol use of about 24.0 standard drinks of alcohol per week. She reports that she does not use drugs.  Allergies  Allergen Reactions  . Buprenorphine Hcl Nausea And Vomiting  . Doxycycline Nausea Only  . Morphine Nausea And Vomiting  . Morphine And Related Nausea And Vomiting  . Ace Inhibitors Cough    Family History  Problem Relation Age of Onset  . Cancer Mother   . COPD Mother        emphysema  . Heart disease Father 41       s/p 3V CABG  . Depression Father   . Barrett's esophagus Father   . Mental illness Sister        depression  . Depression Sister   . Mental illness Son        ADHD, bipolar disorder  . Diabetes Maternal Grandmother   . Heart disease Maternal Grandmother    . Diabetes Maternal Grandfather   . Cancer Maternal Grandfather        lung cancer  . Diabetes Paternal Grandmother   . Kidney disease Paternal Grandmother   . Diabetes Paternal Grandfather   . Heart disease Paternal Grandfather      Prior to Admission medications   Medication Sig Start Date End Date Taking? Authorizing Provider  acetaminophen-codeine (TYLENOL #3) 300-30 MG tablet Take 1 tablet by mouth every 8 (eight) hours as needed for moderate pain.  11/03/19  Yes [provider]  amphetamine-dextroamphetamine (ADDERALL) 15 MG tablet Take 0.5 tablets by mouth 2 (two) times daily. Patient taking differently: Take 15 mg by mouth 2 (two) times daily.  05/17/14  Yes Porfirio Oar, PA  aspirin EC 81 MG tablet Take 81 mg by mouth daily.   Yes [provider]  ezetimibe (ZETIA) 10 MG tablet Take 1 tablet (10 mg total) by mouth daily. 08/18/19 11/17/19 Yes [name], MD  gabapentin (NEURONTIN) 400 MG capsule Take 400 mg by mouth at bedtime.   Yes [provider]  losartan (COZAAR) 50 MG tablet Take 1.5 tablets (  75 mg total) by mouth daily. 08/18/19  Yes [name], MD  metoprolol tartrate (LOPRESSOR) 25 MG tablet Take 1 tablet (25 mg total) by mouth 2 (two) times daily. OFFICE VISIT NEEDED Patient taking differently: Take 25 mg by mouth 2 (two) times daily.  08/05/18  Yes [name], MD  Multiple Vitamin (MULTIVITAMIN WITH MINERALS) TABS tablet Take 1 tablet by mouth daily. 09/22/19  Yes Azucena Fallen, MD  nitroGLYCERIN (NITROSTAT) 0.4 MG SL tablet Place 1 tablet (0.4 mg total) under the tongue every 5 (five) minutes as needed for chest pain. 08/31/16  Yes [name], MD  omeprazole (PRILOSEC) 20 MG capsule Take 20 mg by mouth 2 (two) times daily before a meal.   Yes [provider]  ondansetron (ZOFRAN) 4 MG tablet Take 1 tablet (4 mg total) by mouth every 8 (eight) hours as needed for nausea or vomiting. 07/12/18  Yes Fayrene Helper, PA-C   pilocarpine (SALAGEN) 5 MG tablet Take 5 mg by mouth 3 (three) times daily.  08/18/15  Yes [provider]  valACYclovir (VALTREX) 500 MG tablet Take 500 mg by mouth 3 (three) times daily as needed (fever blister).   Yes [provider]  zolpidem (AMBIEN) 5 MG tablet Take 5 mg by mouth at bedtime.  12/07/14  Yes [provider]  gabapentin (NEURONTIN) 600 MG tablet Take 1 tablet (600 mg total) by mouth 3 (three) times daily. Patient not taking: Reported on 11/17/2019 09/30/19   Anson Fret, MD  promethazine (PHENERGAN) 25 MG tablet Take 1 tablet (25 mg total) by mouth every 6 (six) hours as needed for nausea or vomiting. Patient not taking: Reported on 11/17/2019 08/13/19   Mesner, Barbara Cower, MD  rOPINIRole (REQUIP) 0.5 MG tablet Take 1 tablet (0.5 mg total) by mouth at bedtime. Patient not taking: Reported on 11/17/2019 09/22/19   Azucena Fallen, MD    Physical Exam: Vitals:   11/17/19 1850 11/17/19 1900 11/17/19 1940 11/17/19 2000  BP: 135/75 111/73 (!) 107/56 114/72  Pulse: 86 85 61 84  Resp: (!) 92 18 (!) 8 16  Temp:      TempSrc:      SpO2: 100% 100% 100% 95%  Weight:      Height:        Constitutional: Lethargic but arousable and oriented x3.  No associated distress.   Skin: no rashes, no lesions, notable poor skin turgor.   Eyes: Pupils are equally reactive to light.  No evidence of scleral icterus or conjunctival pallor.  ENMT: Dry mucous membranes noted.  Posterior pharynx clear of any exudate or lesions.   Neck: normal, supple, no masses, no thyromegaly.  No evidence of jugular venous distension.   Respiratory: Mild bibasilar rales without wheezing, Normal respiratory effort. No accessory muscle use.  Cardiovascular: Regular rate and rhythm, no murmurs / rubs / gallops. No extremity edema. 2+ pedal pulses. No carotid bruits.  Chest:   Nontender without crepitus or deformity.   Back:   Nontender without crepitus or deformity. Abdomen: Abdomen is  soft and nontender.  No evidence of intra-abdominal masses.  Positive bowel sounds noted in all quadrants.   Musculoskeletal: Notable left foot tenderness with associated edema.  Limited range of motion of the distal left lower extremity due to pain.  Otherwise, no joint deformity upper and lower extremities. no contractures. Normal muscle tone.  Neurologic: CN 2-12 grossly intact. Sensation intact.  Patient moving all 4 extremities spontaneously.  Patient is following  all commands.  Patient is responsive to verbal stimuli.   Psychiatric: Patient exhibits depressed mood with flat affect.  Patient seems to possess insight as to their current situation.     Labs on Admission: I have personally reviewed following labs and imaging studies -   CBC: Recent Labs  Lab 11/17/19 1336  WBC 10.6*  NEUTROABS 7.2  HGB 11.1*  HCT 31.9*  MCV 106.7*  PLT 350   Basic Metabolic Panel: Recent Labs  Lab 11/17/19 1456  NA 132*  K 4.9  CL 97*  CO2 26  GLUCOSE 96  BUN 30*  CREATININE 2.08*  CALCIUM 8.7*   GFR: Estimated Creatinine Clearance: 24.5 mL/min (A) (by C-G formula based on SCr of 2.08 mg/dL (H)). Liver Function Tests: Recent Labs  Lab 11/17/19 1456  AST 49*  ALT 29  ALKPHOS 92  BILITOT 0.8  PROT 7.2  ALBUMIN 2.9*   No results for input(s): LIPASE, AMYLASE in the last 168 hours. No results for input(s): AMMONIA in the last 168 hours. Coagulation Profile: No results for input(s): INR, PROTIME in the last 168 hours. Cardiac Enzymes: No results for input(s): CKTOTAL, CKMB, CKMBINDEX, TROPONINI in the last 168 hours. BNP (last 3 results) No results for input(s): PROBNP in the last 8760 hours. HbA1C: No results for input(s): HGBA1C in the last 72 hours. CBG: No results for input(s): GLUCAP in the last 168 hours. Lipid Profile: No results for input(s): CHOL, HDL, LDLCALC, TRIG, CHOLHDL, LDLDIRECT in the last 72 hours. Thyroid Function Tests: No results for input(s): TSH,  T4TOTAL, FREET4, T3FREE, THYROIDAB in the last 72 hours. Anemia Panel: No results for input(s): VITAMINB12, FOLATE, FERRITIN, TIBC, IRON, RETICCTPCT in the last 72 hours. Urine analysis:    Component Value Date/Time   COLORURINE YELLOW 11/17/2019 1608   APPEARANCEUR CLOUDY (A) 11/17/2019 1608   LABSPEC 1.006 11/17/2019 1608   PHURINE 6.0 11/17/2019 1608   GLUCOSEU 50 (A) 11/17/2019 1608   HGBUR NEGATIVE 11/17/2019 1608   BILIRUBINUR NEGATIVE 11/17/2019 1608   KETONESUR NEGATIVE 11/17/2019 1608   PROTEINUR NEGATIVE 11/17/2019 1608   NITRITE POSITIVE (A) 11/17/2019 1608   LEUKOCYTESUR LARGE (A) 11/17/2019 1608    Radiological Exams on Admission - Personally Reviewed: CT ABDOMEN PELVIS WO CONTRAST  Result Date: 11/17/2019 CLINICAL DATA:  Acute kidney injury, elevated BUN, creatinine and GFR EXAM: CT ABDOMEN AND PELVIS WITHOUT CONTRAST TECHNIQUE: Multidetector CT imaging of the abdomen and pelvis was performed following the standard protocol without IV contrast. COMPARISON:  CT 04/08/2019 FINDINGS: Lower chest: Lung bases are clear. Normal heart size. No pericardial effusion. Few coronary artery calcifications. Hepatobiliary: No visible liver lesion on this unenhanced CT. Smooth surface contour. Normal liver attenuation. Post cholecystectomy. No biliary dilatation or intraductal gallstones. Pancreas: Unremarkable. No pancreatic ductal dilatation or surrounding inflammatory changes. Spleen: Normal in size. No concerning splenic lesions. Adrenals/Urinary Tract: Normal adrenal glands. Kidneys are symmetric in size and normally located. No visible concerning renal lesions. Punctate calcification in the upper pole left kidney has an appearance more suggestive of a vascular calcification on the coronal image. No obstructive urolithiasis or hydronephrosis. Urinary bladder is largely decompressed at the time of exam and therefore poorly evaluated by CT imaging. Mild bladder wall thickening is nonspecific  in the setting of underdistention. Stomach/Bowel: Distal esophagus, stomach and duodenal sweep are unremarkable. No small bowel wall thickening or dilatation. No evidence of obstruction. A normal appendix is visualized. No colonic dilatation or wall thickening. Intramural fat within the cecum is a  nonspecific finding which can be seen as sequela of prior inflammatory bowel disease or lipomatous body habitus. Vascular/Lymphatic: Atherosclerotic calcifications within the abdominal aorta and branch vessels. No aneurysm or ectasia. No enlarged abdominopelvic lymph nodes. Reproductive: Uterus is surgically absent. No concerning adnexal lesions. Other: No abdominopelvic free fluid or free gas. No bowel containing hernias. Small fat containing umbilical hernia. Musculoskeletal: Mild multilevel degenerative changes in the spine including a partially calcified right central disc protrusion L5-S1 resulting in mild canal stenosis. Unchanged grade 1 anterolisthesis L4 on 5 without associated spondylolysis. Mild degenerative changes in the hips and pelvis as well as small benign synovial pits in the right femoral neck. No acute osseous abnormality or suspicious osseous lesion. IMPRESSION: 1. No obstructive urolithiasis or hydronephrosis. No other acute CT abnormality to provide cause for patient's symptoms. 2. Mild bladder wall thickening is nonspecific in the setting of underdistention. Recommend correlation with urinalysis to exclude cystitis. 3. Intramural fat within the cecum is a nonspecific finding which can be seen as sequela of prior inflammatory bowel disease or lipomatous body habitus. 4. Mild multilevel degenerative changes in the spine including a partially calcified right central disc protrusion L5-S1 resulting in mild canal stenosis. 5. Aortic Atherosclerosis (ICD10-I70.0). Electronically Signed   By: Kreg Shropshire M.D.   On: 11/17/2019 20:30    Telemetry: Personally reviewed.  Rhythm is normal sinus  rhythm.  Assessment/Plan Active Problems:   AKI (acute kidney injury) (HCC)   Patient presenting with notable acute kidney injury with creatinine of 2.08 compared to baseline of 0.68.  Patient reports longstanding progressive poor oral intake however in the past several weeks this intake has dramatically worsened with nausea.  Possibly related to recent IVIG infusion or possibly related to ongoing alcohol abuse and poor oral intake related to that.  Additionally, patient suffering from acute kidney injury complicating presentation  Hydrating patient with intravenous isotonic fluids  Treating underlying urinary tract infection with intravenous antibiotics  Strict input and output monitoring  Temporarily holding home regimen of ARB  Monitor renal function and electrolytes with serial chemistries  If renal function does not rapidly improve will expand work-up of etiology.  Acute cystitis without hyponatremia   Urinalysis suggestive of urinary tract infection  Presentation complicated by acute kidney injury  Patient placed on intravenous ceftriaxone  Urine and blood cultures pending  CT imaging of abdomen and pelvis ordered to evaluate for any evidence of pyelonephritis or other focus of infection consider immunocompromise state.    Essential hypertension   Continue home regimen of metoprolol  Temporarily holding home regimen of losartan  As needed intravenous antihypertensives for excessively elevated blood pressures.    Acute sensory neuropathy   Recent diagnosis in August 2021 hospitalized at [facility] health, evaluated and diagnosed by neurology  Patient received IVIG infusions during the hospitalization in addition to recent IVIG infusion earlier this month.  Patient follows with Dr.Ahern in the outpatient setting  Due to continued weakness will obtain physical therapy evaluation  Continue outpatient follow-up    Unspecified fracture of left foot, initial  encounter for closed fracture   Patient suffered a mechanical fall several weeks ago with left hip fracture, x-ray not available.  Patient possesses a boot at the bedside which she has been instructed by orthopedics to use when ambulating  Due to patient's ongoing pain and swelling, will obtain ultrasound of that extremity to ensure there is no concurrent DVT.  Will obtain PT evaluation  As needed analgesics  Continue outpatient follow-up  Dehydration with hyponatremia   Hyponatremia likely secondary to volume depletion however alcohol abuse could also be potentially causing a hyperosmolar hyponatremia.  Will hydrate with intravenous isotonic fluids overnight  Monitoring sodium levels with serial chemistries  If sodium levels do not respond appropriately will expand work-up of hyponatremia.    Gastroesophageal reflux disease without esophagitis  . Continuing home regimen of daily PPI therapy.    Sjogren's syndrome (HCC)   Continue home regimen of [place]ine  Outpatient follow-up    Mixed hyperlipidemia  . Continuing home regimen of lipid lowering therapy.    Nicotine dependence, cigarettes, uncomplicated   Counseling patient daily on cessation    Coronary artery disease involving native coronary artery of native heart without angina pectoris    Patient is currently chest pain-free  Patient is being monitored on telemetry  Continuing home regimen of statin, lipid-lowering therapy and beta-blocker  Alcohol abuse  Patient reports drinking between 3 and 10 shots of vodka daily.  Counseling patient on cessation  CIWA protocol to monitor for withdrawal with as needed benzodiazepines   Code Status:  Full code Family Communication: deferred   Status is: Observation  The patient remains OBS appropriate and will d/c before 2 midnights.  Dispo: The patient is from: Home              Anticipated d/c is to: Home              Anticipated d/c date is: 2  days              Patient currently is not medically stable to d/c.        Marinda Elk MD [facility] Pager [phone]  If 7PM-7AM, please contact night-coverage [email or web address] Use universal [place] password for that web site. If you do not have the password, please call the hospital operator.  11/17/2019, 8:48 PM

## 2019-11-17 NOTE — ED Notes (Signed)
ED Provider at bedside. 

## 2019-11-17 NOTE — ED Triage Notes (Signed)
patient reports that she had blood work yesterday and was called for elevated BUN, Creatinine, and GFR. Patient was told to come to the ED.

## 2019-11-17 NOTE — ED Notes (Signed)
Assumed care of patient at this time, nad noted, sr up x2, bed locked and low, call bell w/I reach.  Will continue to monitor. ° °

## 2019-11-17 NOTE — ED Notes (Signed)
Bladder scan showed 13 ml and 20 ml.  Patient stated she just voided before she left home.

## 2019-11-17 NOTE — ED Notes (Signed)
Patient is resting comfortably. 

## 2019-11-17 NOTE — ED Notes (Signed)
Pt ambulatory to bathroom with standby assist and walker. 

## 2019-11-17 NOTE — ED Provider Notes (Signed)
Emergency Department Provider Note   I have reviewed the triage vital signs and the nursing notes.   HISTORY  Chief Complaint Abnormal Lab   HPI Lynn Harvey is a 54 y.o. female with PMH reviewed below presents to the ED with abnormal labs from PCP. Patient has developed neuropathy recently as an outpatient. She had an episode of syncope earlier in the month and was following with her PCP in that setting. Apparently, her chemistry performed yesterday showed AKI and hyperkalemia and she was called and referred to the ED for evaluation. She reports poor appetite but that has been over 1 year without significant worsening. She continues to urinate normally. No change in urine color. No abdominal or flank pain. No fever/chills. No new medications. Patient denies NSAID use at home. No radiation of symptoms or modifying factors.    Past Medical History:  Diagnosis Date   Acute sensory neuropathy 09/18/2019   ADD (attention deficit disorder)    Allergy    Anemia    Anxiety    Coronary artery disease    a. s/p NSTEMI in 2015 with angioplasty alone to mid-LAD, repeat cath within the same month showing a patent site   Depression    Depression    Fatty liver    GAD (generalized anxiety disorder)    GERD (gastroesophageal reflux disease)    Gestational diabetes    History of heart attack    Hyperlipidemia    Hypertension    Insomnia    Lupus (HCC)    Milia    Myocardial infarction (HCC) 06/2013   Rectal abnormality    Sicca syndrome (HCC)    Sjogren's syndrome (HCC)    Telangiectasia    Vitamin D deficiency     Patient Active Problem List   Diagnosis Date Noted   Mixed hyperlipidemia 11/17/2019   Nicotine dependence, cigarettes, uncomplicated 11/17/2019   AKI (acute kidney injury) (HCC) 11/17/2019   Unspecified fracture of left foot, initial encounter for closed fracture 11/17/2019   Dehydration with hyponatremia 11/17/2019   Alcohol abuse  11/17/2019   Acute cystitis without hematuria 11/17/2019   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   Bilateral carpal tunnel syndrome 10/01/2019   Ulnar neuropathy at elbow, right 10/01/2019   Paresthesias 10/01/2019   Acute sensory neuropathy 09/18/2019   Complex ovarian cyst 10/12/2016   Presence of granulation tissue 10/12/2016   Hemorrhagic cyst of ovary 10/12/2016   Edema 08/24/2016   Drug therapy 12/20/2015   Sjogren's syndrome (HCC) 12/20/2015   Elevated liver function tests 12/19/2015   History of myocardial infarction 12/06/2015   ANA positive 08/18/2015   Mouth dryness 08/18/2015   GAD (generalized anxiety disorder) 08/17/2015   Recurrent major depressive disorder (HCC) 08/17/2015   Left foot pain 07/04/2015   Telangiectasia 07/04/2015   Elevated glucose 05/04/2015   Milia 05/04/2015   Vitamin D deficiency 05/04/2015   Anemia 04/27/2015   Fatigue 04/27/2015   Health care maintenance 04/27/2015   History of MI (myocardial infarction) 04/27/2015   Hot flashes 04/27/2015   Low back pain radiating to both legs 03/10/2014   Bilateral carotid artery disease (HCC) 02/17/2014   First degree hemorrhoids 01/15/2014   Gastroesophageal reflux disease without esophagitis 01/15/2014   Iron deficiency anemia 01/13/2014   RBC microcytosis 01/13/2014   Coronary artery disease involving native coronary artery of native heart without angina pectoris 12/17/2013   Essential hypertension 06/25/2013   High cholesterol 06/25/2013   Chest pain with moderate risk of acute coronary syndrome  06/24/2013   NSTEMI (non-ST elevated myocardial infarction) (HCC) 06/24/2013   Myocardial infarction (HCC) 06/19/2013   Tobacco abuse 06/10/2012   Allergic rhinitis 06/10/2012   ADD (attention deficit disorder) 03/06/2012   Depressive disorder 04/12/2011    Past Surgical History:  Procedure Laterality Date   ABDOMINAL HYSTERECTOMY     CHOLECYSTECTOMY   08/2018   COLON SURGERY     Flexible Sigmoidoscopy   COLONOSCOPY N/A 01/08/2014   Procedure: COLONOSCOPY;  Surgeon: Theda Belfast, MD;  Location: [facility];  Service: Endoscopy;  Laterality: N/A;   ESOPHAGOGASTRODUODENOSCOPY N/A 01/08/2014   Procedure: ESOPHAGOGASTRODUODENOSCOPY (EGD);  Surgeon: Theda Belfast, MD;  Location: [facility];  Service: Endoscopy;  Laterality: N/A;   LEFT HEART CATHETERIZATION WITH CORONARY ANGIOGRAM N/A 06/24/2013   Procedure: LEFT HEART CATHETERIZATION WITH CORONARY ANGIOGRAM;  Surgeon: [name], MD;  Location: [facility] CATH LAB;  Service: Cardiovascular;  Laterality: N/A;   LEFT HEART CATHETERIZATION WITH CORONARY ANGIOGRAM N/A 07/17/2013   Procedure: LEFT HEART CATHETERIZATION WITH CORONARY ANGIOGRAM;  Surgeon: Kathleene Hazel, MD;  Location: [facility] CATH LAB;  Service: Cardiovascular;  Laterality: N/A;   TONSILECTOMY, ADENOIDECTOMY, BILATERAL MYRINGOTOMY AND TUBES     WRIST SURGERY  1987   RIGHT; bone from forearm grafted to wrist    Allergies Buprenorphine hcl, Doxycycline, Morphine, Morphine and related, and Ace inhibitors  Family History  Problem Relation Age of Onset   Cancer Mother    COPD Mother        emphysema   Heart disease Father 12       s/p 3V CABG   Depression Father    Barrett's esophagus Father    Mental illness Sister        depression   Depression Sister    Mental illness Son        ADHD, bipolar disorder   Diabetes Maternal Grandmother    Heart disease Maternal Grandmother    Diabetes Maternal Grandfather    Cancer Maternal Grandfather        lung cancer   Diabetes Paternal Grandmother    Kidney disease Paternal Grandmother    Diabetes Paternal Grandfather    Heart disease Paternal Grandfather     Social History Social History   Tobacco Use   Smoking status: Current Every Day Smoker    Packs/day: 1.50    Years: 20.00    Pack years: 30.00    Types: Cigarettes    Start date: 12/25/1982    Smokeless tobacco: Never Used   Tobacco comment: 09/03/19 < 1.5 ppd  Vaping Use   Vaping Use: Never used  Substance Use Topics   Alcohol use: Yes    Alcohol/week: 24.0 standard drinks    Types: 24 Cans of beer per week   Drug use: No    Review of Systems  Constitutional: No fever/chills Eyes: No visual changes. ENT: No sore throat. Cardiovascular: Denies chest pain. Respiratory: Denies shortness of breath. Gastrointestinal: No abdominal pain.  No nausea, no vomiting.  No diarrhea.  No constipation. Genitourinary: Negative for dysuria. Musculoskeletal: Negative for back pain. Skin: Negative for rash. Neurological: Negative for headaches, focal weakness or numbness.  10-point ROS otherwise negative.  ____________________________________________   PHYSICAL EXAM:  VITAL SIGNS: ED Triage Vitals  Enc Vitals Group     BP 11/17/19 1237 (!) 90/52     Pulse Rate 11/17/19 1237 (!) 103     Resp 11/17/19 1237 16     Temp 11/17/19 1237 98.3 F (36.8 C)  Temp Source 11/17/19 1237 Oral     SpO2 11/17/19 1237 98 %     Weight 11/17/19 1239 107 lb (48.5 kg)     Height 11/17/19 1239 5\' 3"  (1.6 m)   Constitutional: Alert and oriented. Well appearing and in no acute distress. Eyes: Conjunctivae are normal.  Head: Atraumatic. Nose: No congestion/rhinnorhea. Mouth/Throat: Mucous membranes are moist.  Neck: No stridor.  Cardiovascular: Tachycardia. Good peripheral circulation. Grossly normal heart sounds.   Respiratory: Normal respiratory effort.  No retractions. Lungs CTAB. Gastrointestinal: Soft and nontender. No distention.  Musculoskeletal: No gross deformities of extremities. No LE edema.  Neurologic:  Normal speech and language. No gross focal neurologic deficits are appreciated.  Skin:  Skin is warm, dry and intact. No rash noted.  ____________________________________________   LABS (all labs ordered are listed, but only abnormal results are displayed)  Labs  Reviewed  CBC WITH DIFFERENTIAL/PLATELET - Abnormal; Notable for the following components:      Result Value   WBC 10.6 (*)    RBC 2.99 (*)    Hemoglobin 11.1 (*)    HCT 31.9 (*)    MCV 106.7 (*)    MCH 37.1 (*)    RDW 19.1 (*)    All other components within normal limits  URINALYSIS, ROUTINE W REFLEX MICROSCOPIC - Abnormal; Notable for the following components:   APPearance CLOUDY (*)    Glucose, UA 50 (*)    Nitrite POSITIVE (*)    Leukocytes,Ua LARGE (*)    WBC, UA >50 (*)    Bacteria, UA RARE (*)    All other components within normal limits  COMPREHENSIVE METABOLIC PANEL - Abnormal; Notable for the following components:   Sodium 132 (*)    Chloride 97 (*)    BUN 30 (*)    Creatinine, Ser 2.08 (*)    Calcium 8.7 (*)    Albumin 2.9 (*)    AST 49 (*)    GFR calc non Af Amer 27 (*)    GFR calc Af Amer 31 (*)    All other components within normal limits  CBC WITH DIFFERENTIAL/PLATELET - Abnormal; Notable for the following components:   RBC 2.54 (*)    Hemoglobin 9.4 (*)    HCT 27.7 (*)    MCV 109.1 (*)    MCH 37.0 (*)    RDW 19.1 (*)    All other components within normal limits  COMPREHENSIVE METABOLIC PANEL - Abnormal; Notable for the following components:   Glucose, Bld 101 (*)    BUN 21 (*)    Creatinine, Ser 1.24 (*)    Calcium 8.7 (*)    Albumin 3.0 (*)    AST 48 (*)    GFR calc non Af Amer 50 (*)    GFR calc Af Amer 58 (*)    All other components within normal limits  CULTURE, BLOOD (ROUTINE X 2)  CULTURE, BLOOD (ROUTINE X 2)  RESPIRATORY PANEL BY RT PCR (FLU A&B, COVID)  URINE CULTURE  LACTIC ACID, PLASMA  MAGNESIUM   ____________________________________________  EKG  Rate: 78 PR: 171 QTc: 421  Sinus rhythm. Narrow QRS. Normal T waves. No ST elevation or depression. No STEMI.  ____________________________________________  RADIOLOGY  CT ABDOMEN PELVIS WO CONTRAST  Result Date: 11/17/2019 CLINICAL DATA:  Acute kidney injury, elevated BUN,  creatinine and GFR EXAM: CT ABDOMEN AND PELVIS WITHOUT CONTRAST TECHNIQUE: Multidetector CT imaging of the abdomen and pelvis was performed following the standard protocol without IV contrast. COMPARISON:  CT 04/08/2019 FINDINGS: Lower chest: Lung bases are clear. Normal heart size. No pericardial effusion. Few coronary artery calcifications. Hepatobiliary: No visible liver lesion on this unenhanced CT. Smooth surface contour. Normal liver attenuation. Post cholecystectomy. No biliary dilatation or intraductal gallstones. Pancreas: Unremarkable. No pancreatic ductal dilatation or surrounding inflammatory changes. Spleen: Normal in size. No concerning splenic lesions. Adrenals/Urinary Tract: Normal adrenal glands. Kidneys are symmetric in size and normally located. No visible concerning renal lesions. Punctate calcification in the upper pole left kidney has an appearance more suggestive of a vascular calcification on the coronal image. No obstructive urolithiasis or hydronephrosis. Urinary bladder is largely decompressed at the time of exam and therefore poorly evaluated by CT imaging. Mild bladder wall thickening is nonspecific in the setting of underdistention. Stomach/Bowel: Distal esophagus, stomach and duodenal sweep are unremarkable. No small bowel wall thickening or dilatation. No evidence of obstruction. A normal appendix is visualized. No colonic dilatation or wall thickening. Intramural fat within the cecum is a nonspecific finding which can be seen as sequela of prior inflammatory bowel disease or lipomatous body habitus. Vascular/Lymphatic: Atherosclerotic calcifications within the abdominal aorta and branch vessels. No aneurysm or ectasia. No enlarged abdominopelvic lymph nodes. Reproductive: Uterus is surgically absent. No concerning adnexal lesions. Other: No abdominopelvic free fluid or free gas. No bowel containing hernias. Small fat containing umbilical hernia. Musculoskeletal: Mild multilevel  degenerative changes in the spine including a partially calcified right central disc protrusion L5-S1 resulting in mild canal stenosis. Unchanged grade 1 anterolisthesis L4 on 5 without associated spondylolysis. Mild degenerative changes in the hips and pelvis as well as small benign synovial pits in the right femoral neck. No acute osseous abnormality or suspicious osseous lesion. IMPRESSION: 1. No obstructive urolithiasis or hydronephrosis. No other acute CT abnormality to provide cause for patient's symptoms. 2. Mild bladder wall thickening is nonspecific in the setting of underdistention. Recommend correlation with urinalysis to exclude cystitis. 3. Intramural fat within the cecum is a nonspecific finding which can be seen as sequela of prior inflammatory bowel disease or lipomatous body habitus. 4. Mild multilevel degenerative changes in the spine including a partially calcified right central disc protrusion L5-S1 resulting in mild canal stenosis. 5. Aortic Atherosclerosis (ICD10-I70.0). Electronically Signed   By: Kreg Shropshire M.D.   On: 11/17/2019 20:30    ____________________________________________   PROCEDURES  Procedure(s) performed:   Procedures  CRITICAL CARE Performed by: Maia Plan Total critical care time: 35 minutes Critical care time was exclusive of separately billable procedures and treating other patients. Critical care was necessary to treat or prevent imminent or life-threatening deterioration. Critical care was time spent personally by me on the following activities: development of treatment plan with patient and/or surrogate as well as nursing, discussions with consultants, evaluation of patient's response to treatment, examination of patient, obtaining history from patient or surrogate, ordering and performing treatments and interventions, ordering and review of laboratory studies, ordering and review of radiographic studies, pulse oximetry and re-evaluation of patient's  condition.  Alona Bene, MD Emergency Medicine  ____________________________________________   INITIAL IMPRESSION / ASSESSMENT AND PLAN / ED COURSE  Pertinent labs & imaging results that were available during my care of the patient were reviewed by me and considered in my medical decision making (see chart for details).   Patient presents to the ED with abnormal labs reviewed in Care Everywhere from yesterday. Will repeat here and perform bladder scan. Patient's BP on arrival is low but apparently she had an inappropriately sized  BP cuff and BP was WNL on repeat.   CMP pending. Care transferred to Dr. Rush Landmark pending CMP. Patient's initial hypotension is responding to IVF.  ____________________________________________  FINAL CLINICAL IMPRESSION(S) / ED DIAGNOSES  Final diagnoses:  Lower urinary tract infectious disease  Hypotension, unspecified hypotension type  AKI (acute kidney injury) (HCC)     MEDICATIONS GIVEN DURING THIS VISIT:  Medications  lactated ringers infusion ( Intravenous New Bag/Given 11/18/19 0339)  cefTRIAXone (ROCEPHIN) 1 g in sodium chloride 0.9 % 100 mL IVPB (has no administration in time range)  acetaminophen-codeine (TYLENOL #3) 300-30 MG per tablet 1 tablet (has no administration in time range)  aspirin EC tablet 81 mg (has no administration in time range)  ezetimibe (ZETIA) tablet 10 mg (has no administration in time range)  metoprolol tartrate (LOPRESSOR) tablet 25 mg (0 mg Oral Hold 11/18/19 0116)  hydrALAZINE (APRESOLINE) injection 10 mg (has no administration in time range)  amphetamine-dextroamphetamine (ADDERALL) tablet 15 mg ( Oral Canceled Entry 11/17/19 2230)  zolpidem (AMBIEN) tablet 5 mg (has no administration in time range)  pantoprazole (PROTONIX) EC tablet 20 mg (has no administration in time range)  multivitamin with minerals tablet 1 tablet (has no administration in time range)  pilocarpine (SALAGEN) tablet 5 mg (5 mg Oral Given 11/18/19  0338)  polyethylene glycol (MIRALAX / GLYCOLAX) packet 17 g (has no administration in time range)  ondansetron (ZOFRAN) tablet 4 mg (has no administration in time range)    Or  ondansetron (ZOFRAN) injection 4 mg (has no administration in time range)  enoxaparin (LOVENOX) injection 30 mg (30 mg Subcutaneous Refused 11/18/19 0355)  acetaminophen (TYLENOL) tablet 650 mg (has no administration in time range)    Or  acetaminophen (TYLENOL) suppository 650 mg (has no administration in time range)  LORazepam (ATIVAN) tablet 1-4 mg (has no administration in time range)    Or  LORazepam (ATIVAN) injection 1-4 mg (has no administration in time range)  thiamine tablet 100 mg (has no administration in time range)    Or  thiamine (B-1) injection 100 mg (has no administration in time range)  folic acid (FOLVITE) tablet 1 mg (has no administration in time range)  gabapentin (NEURONTIN) capsule 400 mg (400 mg Oral Given 11/18/19 0400)  sodium chloride 0.9 % bolus 1,000 mL (0 mLs Intravenous Stopped 11/17/19 1602)  sodium chloride 0.9 % bolus 1,000 mL (0 mLs Intravenous Stopped 11/18/19 0117)  cefTRIAXone (ROCEPHIN) 1 g in sodium chloride 0.9 % 100 mL IVPB (0 g Intravenous Stopped 11/18/19 0117)    Note:  This document was prepared using Dragon voice recognition software and may include unintentional dictation errors.  Alona Bene, MD, [facility] Emergency Medicine    Laquitta Dominski, Arlyss Repress, MD 11/18/19 [phone]

## 2019-11-17 NOTE — ED Notes (Signed)
Hospitalist at the bedside to evaluate the patient

## 2019-11-18 ENCOUNTER — Observation Stay (HOSPITAL_COMMUNITY): Payer: 59

## 2019-11-18 DIAGNOSIS — E871 Hypo-osmolality and hyponatremia: Secondary | ICD-10-CM | POA: Diagnosis present

## 2019-11-18 DIAGNOSIS — M3500 Sicca syndrome, unspecified: Secondary | ICD-10-CM | POA: Diagnosis present

## 2019-11-18 DIAGNOSIS — F988 Other specified behavioral and emotional disorders with onset usually occurring in childhood and adolescence: Secondary | ICD-10-CM | POA: Diagnosis present

## 2019-11-18 DIAGNOSIS — D509 Iron deficiency anemia, unspecified: Secondary | ICD-10-CM | POA: Diagnosis present

## 2019-11-18 DIAGNOSIS — G608 Other hereditary and idiopathic neuropathies: Secondary | ICD-10-CM | POA: Diagnosis not present

## 2019-11-18 DIAGNOSIS — I10 Essential (primary) hypertension: Secondary | ICD-10-CM | POA: Diagnosis present

## 2019-11-18 DIAGNOSIS — D638 Anemia in other chronic diseases classified elsewhere: Secondary | ICD-10-CM | POA: Diagnosis present

## 2019-11-18 DIAGNOSIS — N179 Acute kidney failure, unspecified: Secondary | ICD-10-CM | POA: Diagnosis present

## 2019-11-18 DIAGNOSIS — G629 Polyneuropathy, unspecified: Secondary | ICD-10-CM | POA: Diagnosis present

## 2019-11-18 DIAGNOSIS — F329 Major depressive disorder, single episode, unspecified: Secondary | ICD-10-CM | POA: Diagnosis present

## 2019-11-18 DIAGNOSIS — F1010 Alcohol abuse, uncomplicated: Secondary | ICD-10-CM | POA: Diagnosis not present

## 2019-11-18 DIAGNOSIS — M7989 Other specified soft tissue disorders: Secondary | ICD-10-CM

## 2019-11-18 DIAGNOSIS — Z955 Presence of coronary angioplasty implant and graft: Secondary | ICD-10-CM | POA: Diagnosis not present

## 2019-11-18 DIAGNOSIS — N3000 Acute cystitis without hematuria: Secondary | ICD-10-CM

## 2019-11-18 DIAGNOSIS — N390 Urinary tract infection, site not specified: Secondary | ICD-10-CM | POA: Diagnosis not present

## 2019-11-18 DIAGNOSIS — Z87440 Personal history of urinary (tract) infections: Secondary | ICD-10-CM | POA: Diagnosis not present

## 2019-11-18 DIAGNOSIS — Z90710 Acquired absence of both cervix and uterus: Secondary | ICD-10-CM | POA: Diagnosis not present

## 2019-11-18 DIAGNOSIS — Z818 Family history of other mental and behavioral disorders: Secondary | ICD-10-CM | POA: Diagnosis not present

## 2019-11-18 DIAGNOSIS — S92902A Unspecified fracture of left foot, initial encounter for closed fracture: Secondary | ICD-10-CM

## 2019-11-18 DIAGNOSIS — B9620 Unspecified Escherichia coli [E. coli] as the cause of diseases classified elsewhere: Secondary | ICD-10-CM | POA: Diagnosis present

## 2019-11-18 DIAGNOSIS — Z20822 Contact with and (suspected) exposure to covid-19: Secondary | ICD-10-CM | POA: Diagnosis present

## 2019-11-18 DIAGNOSIS — F17210 Nicotine dependence, cigarettes, uncomplicated: Secondary | ICD-10-CM | POA: Diagnosis present

## 2019-11-18 DIAGNOSIS — E860 Dehydration: Secondary | ICD-10-CM | POA: Diagnosis present

## 2019-11-18 DIAGNOSIS — E782 Mixed hyperlipidemia: Secondary | ICD-10-CM | POA: Diagnosis present

## 2019-11-18 DIAGNOSIS — I252 Old myocardial infarction: Secondary | ICD-10-CM | POA: Diagnosis not present

## 2019-11-18 DIAGNOSIS — K219 Gastro-esophageal reflux disease without esophagitis: Secondary | ICD-10-CM | POA: Diagnosis present

## 2019-11-18 DIAGNOSIS — X58XXXA Exposure to other specified factors, initial encounter: Secondary | ICD-10-CM | POA: Diagnosis present

## 2019-11-18 DIAGNOSIS — I2510 Atherosclerotic heart disease of native coronary artery without angina pectoris: Secondary | ICD-10-CM | POA: Diagnosis not present

## 2019-11-18 LAB — CBC WITH DIFFERENTIAL/PLATELET
Abs Immature Granulocytes: 0.02 10*3/uL (ref 0.00–0.07)
Basophils Absolute: 0 10*3/uL (ref 0.0–0.1)
Basophils Relative: 0 %
Eosinophils Absolute: 0.1 10*3/uL (ref 0.0–0.5)
Eosinophils Relative: 1 %
HCT: 27.7 % — ABNORMAL LOW (ref 36.0–46.0)
Hemoglobin: 9.4 g/dL — ABNORMAL LOW (ref 12.0–15.0)
Immature Granulocytes: 0 %
Lymphocytes Relative: 28 %
Lymphs Abs: 1.9 10*3/uL (ref 0.7–4.0)
MCH: 37 pg — ABNORMAL HIGH (ref 26.0–34.0)
MCHC: 33.9 g/dL (ref 30.0–36.0)
MCV: 109.1 fL — ABNORMAL HIGH (ref 80.0–100.0)
Monocytes Absolute: 0.5 10*3/uL (ref 0.1–1.0)
Monocytes Relative: 7 %
Neutro Abs: 4.3 10*3/uL (ref 1.7–7.7)
Neutrophils Relative %: 64 %
Platelets: 232 10*3/uL (ref 150–400)
RBC: 2.54 MIL/uL — ABNORMAL LOW (ref 3.87–5.11)
RDW: 19.1 % — ABNORMAL HIGH (ref 11.5–15.5)
WBC: 6.9 10*3/uL (ref 4.0–10.5)
nRBC: 0 % (ref 0.0–0.2)

## 2019-11-18 LAB — COMPREHENSIVE METABOLIC PANEL
ALT: 26 U/L (ref 0–44)
AST: 48 U/L — ABNORMAL HIGH (ref 15–41)
Albumin: 3 g/dL — ABNORMAL LOW (ref 3.5–5.0)
Alkaline Phosphatase: 93 U/L (ref 38–126)
Anion gap: 9 (ref 5–15)
BUN: 21 mg/dL — ABNORMAL HIGH (ref 6–20)
CO2: 24 mmol/L (ref 22–32)
Calcium: 8.7 mg/dL — ABNORMAL LOW (ref 8.9–10.3)
Chloride: 103 mmol/L (ref 98–111)
Creatinine, Ser: 1.24 mg/dL — ABNORMAL HIGH (ref 0.44–1.00)
GFR calc Af Amer: 58 mL/min — ABNORMAL LOW (ref >60)
GFR calc non Af Amer: 50 mL/min — ABNORMAL LOW (ref >60)
Glucose, Bld: 101 mg/dL — ABNORMAL HIGH (ref 70–99)
Potassium: 3.9 mmol/L (ref 3.5–5.1)
Sodium: 136 mmol/L (ref 135–145)
Total Bilirubin: 0.6 mg/dL (ref 0.3–1.2)
Total Protein: 6.9 g/dL (ref 6.5–8.1)

## 2019-11-18 LAB — MAGNESIUM: Magnesium: 2 mg/dL (ref 1.7–2.4)

## 2019-11-18 MED ORDER — GABAPENTIN 400 MG PO CAPS
400.0000 mg | ORAL_CAPSULE | Freq: Every day | ORAL | Status: DC
  Administered 2019-11-18 (×2): 400 mg via ORAL
  Filled 2019-11-18 (×2): qty 1

## 2019-11-18 MED ORDER — NICOTINE 21 MG/24HR TD PT24
21.0000 mg | MEDICATED_PATCH | Freq: Every day | TRANSDERMAL | Status: DC
  Administered 2019-11-18 – 2019-11-19 (×2): 21 mg via TRANSDERMAL
  Filled 2019-11-18 (×2): qty 1

## 2019-11-18 MED ORDER — LACTATED RINGERS IV SOLN: INTRAVENOUS | Status: AC

## 2019-11-18 MED ORDER — METOPROLOL TARTRATE 25 MG PO TABS
12.5000 mg | ORAL_TABLET | Freq: Two times a day (BID) | ORAL | Status: DC
  Administered 2019-11-19: 12.5 mg via ORAL
  Filled 2019-11-18: qty 1

## 2019-11-18 NOTE — ED Notes (Signed)
Patient is resting comfortably. 

## 2019-11-18 NOTE — Progress Notes (Signed)
PROGRESS NOTE  Lynn Harvey  VHQ:[id number] DOB: [birth year] DOA: 11/17/2019 PCP: [name], MD  Outpatient Specialists: Neurology, Dr. [name] Brief Narrative: Lynn Harvey is a 54 y.o. 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 She was sent to the ED for elevated creatinine, confirmed to have acute kidney injury, presumably from dehydration, given IVF and admitted 9/28.   Assessment & Plan: Active Problems:   Essential hypertension   Coronary artery disease involving native coronary artery of native heart without angina pectoris   Gastroesophageal reflux disease without esophagitis   Sjogren's syndrome (HCC)   Acute sensory neuropathy   Mixed hyperlipidemia   Nicotine dependence, cigarettes, uncomplicated   AKI (acute kidney injury) (HCC)   Unspecified fracture of left foot, initial encounter for closed fracture   Dehydration with hyponatremia   Alcohol abuse   Acute cystitis without hematuria   Acute kidney injury (HCC)  AKI: SCr improving, still ~2x baseline, with IV fluids.  - Continue LR IVF and push po intake.  - Hold ARB  Acute cystitis: Convincing UA, culture pending. Has had UTI's requiring Tx in the past, does not feel definite symptoms at this time. CT demonstrates thickened bladder without pyelonephritis-type changes.  - Continue ceftriaxone for now, monitor urine and blood cultures.   HTN:  - Hold metoprolol and losartan with soft BPs.   Acute sensory neuropathy: Recent diagnosis in August 2021 hospitalized at [facility], evaluated and diagnosed by neurology. Patient received IVIG infusions during the hospitalization in addition to recent IVIG infusion earlier this month. - Continue follow-up with Dr.Ahern in the outpatient  setting - PT evaluation today.   Closed left foot fracture: No DVT in LLE on venous U/S 9/29. - WBAT w/boot, rolling walker, fall precautions - Follow up with orthopedics as outpatient - PT> will need home health PT. - tylenol prn  Dehydration with hyponatremia: Hyponatremia likely secondary to volume depletion however alcohol abuse could also be potentially causing a hyperosmolar hyponatremia. - Continue monitoring w/IVF  Sjogren's syndrome:  - Continue pilocarpine  Tobacco use:  - Nicotine patch - Cessation counseling  Alcohol abuse:  - Continue monitoring for withdrawal, continue CIWA. Required ativan.   GERD:  - Continue PPI  CAD: No anginal complaints. - Continue ASA, statin, BB  DVT prophylaxis: Lovenox Code Status: Full Family Communication: None at bedside Disposition Plan:  Status is: Inpatient  Remains inpatient appropriate because:IV treatments appropriate due to intensity of illness or inability to take PO  Dispo: The patient is from: Home              Anticipated d/c is to: Home              Anticipated d/c date is: 1 day              Patient currently is not medically stable to d/c.  Consultants:   None  Procedures:   None  Antimicrobials:  Ceftriaxone   Subjective: Nervous about husband who is admitted and delirious at [facility] hospital for EtOH withdrawal. She is feeling a bit stronger, but still with pain in left foot, not eating 100%.   Objective: Vitals:   11/18/19 0413 11/18/19 0510 11/18/19 1000 11/18/19 1415  BP: 116/79 119/73 114/70 (!) 88/61  Pulse: 85 83 76  73  Resp: 15 17  19   Temp:  98.1 F (36.7 C)  98.4 F (36.9 C)  TempSrc:    Oral  SpO2: 100% 100%  99%  Weight:      Height:        Intake/Output Summary (Last 24 hours) at 11/18/2019 1717 Last data filed at 11/18/2019 1500 Gross per 24 hour  Intake 800 ml  Output --  Net 800 ml   Filed Weights   11/17/19 1239  Weight: 48.5 kg    Gen: 54 y.o. female in no  distress Pulm: Non-labored breathing room air. Clear to auscultation bilaterally.  CV: Regular rate and rhythm. No murmur, rub, or gallop. No JVD, no pedal edema. GI: Abdomen soft, +suprapubic tenderness, non-distended, with normoactive bowel sounds. No organomegaly or masses felt. Ext: Warm, no deformities Skin: No rashes, lesions, ulcers Neuro: Alert and oriented. No focal neurological deficits. Psych: Judgement and insight appear normal. Mood & affect appropriate.   Data Reviewed: I have personally reviewed following labs and imaging studies  CBC: Recent Labs  Lab 11/17/19 1336 11/18/19 0410  WBC 10.6* 6.9  NEUTROABS 7.2 4.3  HGB 11.1* 9.4*  HCT 31.9* 27.7*  MCV 106.7* 109.1*  PLT 350 232   Basic Metabolic Panel: Recent Labs  Lab 11/17/19 1456 11/18/19 0410  NA 132* 136  K 4.9 3.9  CL 97* 103  CO2 26 24  GLUCOSE 96 101*  BUN 30* 21*  CREATININE 2.08* 1.24*  CALCIUM 8.7* 8.7*  MG  --  2.0   GFR: Estimated Creatinine Clearance: 41.1 mL/min (A) (by C-G formula based on SCr of 1.24 mg/dL (H)). Liver Function Tests: Recent Labs  Lab 11/17/19 1456 11/18/19 0410  AST 49* 48*  ALT 29 26  ALKPHOS 92 93  BILITOT 0.8 0.6  PROT 7.2 6.9  ALBUMIN 2.9* 3.0*   No results for input(s): LIPASE, AMYLASE in the last 168 hours. No results for input(s): AMMONIA in the last 168 hours. Coagulation Profile: No results for input(s): INR, PROTIME in the last 168 hours. Cardiac Enzymes: No results for input(s): CKTOTAL, CKMB, CKMBINDEX, TROPONINI in the last 168 hours. BNP (last 3 results) No results for input(s): PROBNP in the last 8760 hours. HbA1C: No results for input(s): HGBA1C in the last 72 hours. CBG: No results for input(s): GLUCAP in the last 168 hours. Lipid Profile: No results for input(s): CHOL, HDL, LDLCALC, TRIG, CHOLHDL, LDLDIRECT in the last 72 hours. Thyroid Function Tests: No results for input(s): TSH, T4TOTAL, FREET4, T3FREE, THYROIDAB in the last 72  hours. Anemia Panel: No results for input(s): VITAMINB12, FOLATE, FERRITIN, TIBC, IRON, RETICCTPCT in the last 72 hours. Urine analysis:    Component Value Date/Time   COLORURINE YELLOW 11/17/2019 1608   APPEARANCEUR CLOUDY (A) 11/17/2019 1608   LABSPEC 1.006 11/17/2019 1608   PHURINE 6.0 11/17/2019 1608   GLUCOSEU 50 (A) 11/17/2019 1608   HGBUR NEGATIVE 11/17/2019 1608   BILIRUBINUR NEGATIVE 11/17/2019 1608   KETONESUR NEGATIVE 11/17/2019 1608   PROTEINUR NEGATIVE 11/17/2019 1608   NITRITE POSITIVE (A) 11/17/2019 1608   LEUKOCYTESUR LARGE (A) 11/17/2019 1608   Recent Results (from the past 240 hour(s))  Blood culture (routine x 2)     Status: None (Preliminary result)   Collection Time: 11/17/19  5:54 PM   Specimen: BLOOD  Result Value Ref Range Status   Specimen Description   Final    BLOOD RIGHT ANTECUBITAL Performed at [facility], [address] [place], [place]  [address]    Special Requests   Final    BOTTLES DRAWN AEROBIC AND ANAEROBIC Blood Culture adequate volume Performed at [facility], [address], [place], [place] [address]    Culture   Final    NO GROWTH < 12 HOURS Performed at [facility], [address] [place], [place] [address]    Report Status PENDING  Incomplete  Respiratory Panel by RT PCR (Flu A&B, Covid) - Nasopharyngeal Swab     Status: None   Collection Time: 11/17/19  5:54 PM   Specimen: Nasopharyngeal Swab  Result Value Ref Range Status   SARS Coronavirus 2 by RT PCR NEGATIVE NEGATIVE Final    Comment: (NOTE) SARS-CoV-2 target nucleic acids are NOT DETECTED.  The SARS-CoV-2 RNA is generally detectable in upper respiratoy specimens during the acute phase of infection. The lowest concentration of SARS-CoV-2 viral copies this assay can detect is 131 copies/mL. A negative result does not preclude SARS-Cov-2 infection and should not be used as the sole basis for treatment or other patient  management decisions. A negative result may occur with  improper specimen collection/handling, submission of specimen other than nasopharyngeal swab, presence of viral mutation(s) within the areas targeted by this assay, and inadequate number of viral copies (<131 copies/mL). A negative result must be combined with clinical observations, patient history, and epidemiological information. The expected result is Negative.  Fact Sheet for Patients:  https://www.moore.com/  Fact Sheet for Healthcare Providers:  https://www.young.biz/  This test is no t yet approved or cleared by the [place] FDA and  has been authorized for detection and/or diagnosis of SARS-CoV-2 by FDA under an Emergency Use Authorization (EUA). This EUA will remain  in effect (meaning this test can be used) for the duration of the COVID-19 declaration under Section 564(b)(1) of the Act, 21 U.S.C. section 360bbb-3(b)(1), unless the authorization is terminated or revoked sooner.     Influenza A by PCR NEGATIVE NEGATIVE Final   Influenza B by PCR NEGATIVE NEGATIVE Final    Comment: (NOTE) The Xpert Xpress SARS-CoV-2/FLU/RSV assay is intended as an aid in  the diagnosis of influenza from Nasopharyngeal swab specimens and  should not be used as a sole basis for treatment. Nasal washings and  aspirates are unacceptable for Xpert Xpress SARS-CoV-2/FLU/RSV  testing.  Fact Sheet for Patients: https://www.moore.com/  Fact Sheet for Healthcare Providers: https://www.young.biz/  This test is not yet approved or cleared by the [place] FDA and  has been authorized for detection and/or diagnosis of SARS-CoV-2 by  FDA under an Emergency Use Authorization (EUA). This EUA will remain  in effect (meaning this test can be used) for the duration of the  Covid-19 declaration under Section 564(b)(1) of the Act, 21  U.S.C. section 360bbb-3(b)(1),  unless the authorization is  terminated or revoked. Performed at [facility], [address] [place], [place] [address]   Blood culture (routine x 2)     Status: None (Preliminary result)   Collection Time: 11/17/19  6:01 PM   Specimen: BLOOD RIGHT HAND  Result Value Ref Range Status   Specimen Description   Final    BLOOD RIGHT HAND Performed at [facility] [place], [address] [place], [place] [address]    Special Requests   Final    BOTTLES DRAWN AEROBIC ONLY Blood Culture results may not be optimal due to an inadequate volume of blood received in culture bottles Performed at [facility], [address] [place], [place] [address]    Culture   Final    NO GROWTH < 12 HOURS Performed at [facility], [address], [place], [place] [address]    Report Status PENDING  Incomplete      Radiology Studies: CT ABDOMEN PELVIS WO CONTRAST  Result Date: 11/17/2019 CLINICAL DATA:  Acute kidney injury, elevated BUN, creatinine and GFR EXAM: CT ABDOMEN AND PELVIS WITHOUT CONTRAST TECHNIQUE: Multidetector CT imaging of the abdomen and pelvis was performed following the standard protocol without IV contrast. COMPARISON:  CT 04/08/2019 FINDINGS: Lower chest: Lung bases are clear. Normal heart size. No pericardial effusion. Few coronary artery calcifications. Hepatobiliary: No visible liver lesion on this unenhanced CT. Smooth surface contour. Normal liver attenuation. Post cholecystectomy. No biliary dilatation or intraductal gallstones. Pancreas: Unremarkable. No pancreatic ductal dilatation or surrounding inflammatory changes. Spleen: Normal in size. No concerning splenic lesions. Adrenals/Urinary Tract: Normal adrenal glands. Kidneys are symmetric in size and normally located. No visible concerning renal lesions. Punctate calcification in the upper pole left kidney has an appearance more suggestive of a vascular calcification on the  coronal image. No obstructive urolithiasis or hydronephrosis. Urinary bladder is largely decompressed at the time of exam and therefore poorly evaluated by CT imaging. Mild bladder wall thickening is nonspecific in the setting of underdistention. Stomach/Bowel: Distal esophagus, stomach and duodenal sweep are unremarkable. No small bowel wall thickening or dilatation. No evidence of obstruction. A normal appendix is visualized. No colonic dilatation or wall thickening. Intramural fat within the cecum is a nonspecific finding which can be seen as sequela of prior inflammatory bowel disease or lipomatous body habitus. Vascular/Lymphatic: Atherosclerotic calcifications within the abdominal aorta and branch vessels. No aneurysm or ectasia. No enlarged abdominopelvic lymph nodes. Reproductive: Uterus is surgically absent. No concerning adnexal lesions. Other: No abdominopelvic free fluid or free gas. No bowel containing hernias. Small fat containing umbilical hernia. Musculoskeletal: Mild multilevel degenerative changes in the spine including a partially calcified right central disc protrusion L5-S1 resulting in mild canal stenosis. Unchanged grade 1 anterolisthesis L4 on 5 without associated spondylolysis. Mild degenerative changes in the hips and pelvis as well as small benign synovial pits in the right femoral neck. No acute osseous abnormality or suspicious osseous lesion. IMPRESSION: 1. No obstructive urolithiasis or hydronephrosis. No other acute CT abnormality to provide cause for patient's symptoms. 2. Mild bladder wall thickening is nonspecific in the setting of underdistention. Recommend correlation with urinalysis to exclude cystitis. 3. Intramural fat within the cecum is a nonspecific finding which can be seen as sequela of prior inflammatory bowel disease or lipomatous body habitus. 4. Mild multilevel degenerative changes in the spine including a partially calcified right central disc protrusion L5-S1  resulting in mild canal stenosis. 5. Aortic Atherosclerosis (ICD10-I70.0). Electronically Signed   By: Kreg Shropshire M.D.   On: 11/17/2019 20:30   VAS [place] LOWER EXTREMITY VENOUS (DVT)  Result Date: 11/18/2019  Lower Venous DVTStudy Indications: Swelling, and s/p fracture LT foot.  Comparison Study: No prior studies. Performing Technologist: Jean Rosenthal  Examination Guidelines: A complete evaluation includes B-mode imaging, spectral Doppler, color Doppler, and power Doppler as needed of all accessible portions of each vessel. Bilateral testing is considered an integral part of a complete examination. Limited examinations for reoccurring indications may be performed as noted. The reflux portion of the exam is performed with the patient in reverse Trendelenburg.  +-----+---------------+---------+-----------+----------+--------------+ RIGHTCompressibilityPhasicitySpontaneityPropertiesThrombus Aging +-----+---------------+---------+-----------+----------+--------------+ CFV  Full  Yes      Yes                                 +-----+---------------+---------+-----------+----------+--------------+   +---------+---------------+---------+-----------+----------+--------------+ LEFT     CompressibilityPhasicitySpontaneityPropertiesThrombus Aging +---------+---------------+---------+-----------+----------+--------------+ CFV      Full           Yes      Yes                                 +---------+---------------+---------+-----------+----------+--------------+ SFJ      Full                                                        +---------+---------------+---------+-----------+----------+--------------+ FV Prox  Full                                                        +---------+---------------+---------+-----------+----------+--------------+ FV Mid   Full                                                         +---------+---------------+---------+-----------+----------+--------------+ FV DistalFull                                                        +---------+---------------+---------+-----------+----------+--------------+ PFV      Full                                                        +---------+---------------+---------+-----------+----------+--------------+ POP      Full           Yes      Yes                                 +---------+---------------+---------+-----------+----------+--------------+ PTV      Full                                                        +---------+---------------+---------+-----------+----------+--------------+ PERO     Full                                                        +---------+---------------+---------+-----------+----------+--------------+  Summary: RIGHT: - No evidence of common femoral vein obstruction.  LEFT: - There is no evidence of deep vein thrombosis in the lower extremity.  - No cystic structure found in the popliteal fossa.  *See table(s) above for measurements and observations.    Preliminary     Scheduled Meds: . amphetamine-dextroamphetamine  15 mg Oral BID  . aspirin EC  81 mg Oral Daily  . enoxaparin (LOVENOX) injection  30 mg Subcutaneous Q24H  . ezetimibe  10 mg Oral Daily  . folic acid  1 mg Oral Daily  . gabapentin  400 mg Oral QHS  . metoprolol tartrate  25 mg Oral BID  . multivitamin with minerals  1 tablet Oral Daily  . nicotine  21 mg Transdermal Daily  . pantoprazole  20 mg Oral BID AC  . pilocarpine  5 mg Oral TID  . thiamine  100 mg Oral Daily   Or  . thiamine  100 mg Intravenous Daily   Continuous Infusions: . cefTRIAXone (ROCEPHIN)  IV       LOS: 0 days   Time spent: 25 minutes.  Tyrone Nine, MD [facility] [email or web address] 11/18/2019, 5:17 PM

## 2019-11-18 NOTE — Progress Notes (Signed)
Lower extremity venous LT study completed.   Please see CV Proc for preliminary results.   Allee Busk, RDMS  

## 2019-11-18 NOTE — Evaluation (Signed)
Physical Therapy Evaluation Patient Details Name: Lynn Harvey MRN: [id number] DOB: 1965-07-20 Today's Date: 11/18/2019   History of Present Illness  54 yo female admitted with AKI. Hx of Sjogren's, CAD, NSTEMI, lupus, ETOH abuse, neuropathy, ADD, recent fall with foot fx-wearing CAM boot and using RW vs scooter.  Clinical Impression  On eval, pt was Min guard assist for mobility. She walked ~50 feet with use of RW and CAM boot L foot. Pt tolerated activity well. She would like f/u PT to see if it could help with neuropathy. OP PT could possibly be most beneficial but transportation is an issue. She is agreeable to HHPT.     Follow Up Recommendations Home health PT    Equipment Recommendations  None recommended by PT    Recommendations for Other Services       Precautions / Restrictions Precautions Precautions: Fall Precaution Comments: CAM boot L foot Restrictions Other Position/Activity Restrictions: CAM boot and RW for ambulation per pt      Mobility  Bed Mobility Overal bed mobility: Modified Independent                Transfers Overall transfer level: Needs assistance   Transfers: Sit to/from Stand Sit to Stand: Min guard         General transfer comment: VCs hand placement.  Ambulation/Gait Ambulation/Gait assistance: Min guard Gait Distance (Feet): 50 Feet Assistive device: Rolling walker (2 wheeled) Gait Pattern/deviations: Step-to pattern;Step-through pattern;Decreased stride length     General Gait Details: Min guard for safety. Slow gait speed.  Stairs            Wheelchair Mobility    Modified Rankin (Stroke Patients Only)       Balance Overall balance assessment: History of Falls                                           Pertinent Vitals/Pain Pain Assessment: 0-10 Pain Score: 5  Pain Location: L foot Pain Descriptors / Indicators: Discomfort;Sore Pain Intervention(s): Limited activity within patient's  tolerance;Monitored during session;Repositioned    Home Living Family/patient expects to be discharged to:: Private residence Living Arrangements: Parent;Other relatives;Spouse/significant other Available Help at Discharge: Family Type of Home: Mobile home Home Access: Stairs to enter Entrance Stairs-Rails: Can reach both;Left;Right Entrance Stairs-Number of Steps: 5 Home Layout: One level Home Equipment: Walker - 2 wheels;Bedside commode      Prior Function Level of Independence: Independent               Hand Dominance        Extremity/Trunk Assessment   Upper Extremity Assessment Upper Extremity Assessment: Overall WFL for tasks assessed    Lower Extremity Assessment Lower Extremity Assessment: Generalized weakness    Cervical / Trunk Assessment Cervical / Trunk Assessment: Normal  Communication   Communication: No difficulties  Cognition Arousal/Alertness: Awake/alert Behavior During Therapy: WFL for tasks assessed/performed Overall Cognitive Status: Within Functional Limits for tasks assessed                                        General Comments      Exercises     Assessment/Plan    PT Assessment Patient needs continued PT services  PT Problem List Decreased mobility;Decreased balance;Decreased activity tolerance;Decreased knowledge of use of  DME;Pain       PT Treatment Interventions DME instruction;Gait training;Therapeutic activities;Therapeutic exercise;Patient/family education;Balance training;Functional mobility training    PT Goals (Current goals can be found in the Care Plan section)  Acute Rehab PT Goals Patient Stated Goal: f/u PT to see if that helps with neuropathy PT Goal Formulation: With patient Time For Goal Achievement: 12/02/19 Potential to Achieve Goals: Good    Frequency Min 3X/week   Barriers to discharge        Co-evaluation               AM-PAC PT "6 Clicks" Mobility  Outcome Measure Help  needed turning from your back to your side while in a flat bed without using bedrails?: None Help needed moving from lying on your back to sitting on the side of a flat bed without using bedrails?: None Help needed moving to and from a bed to a chair (including a wheelchair)?: A Little Help needed standing up from a chair using your arms (e.g., wheelchair or bedside chair)?: A Little Help needed to walk in hospital room?: A Little Help needed climbing 3-5 steps with a railing? : A Little 6 Click Score: 20    End of Session   Activity Tolerance: Patient tolerated treatment well Patient left: in bed;with call bell/phone within reach;with bed alarm set   PT Visit Diagnosis: History of falling (Z91.81);Difficulty in walking, not elsewhere classified (R26.2);Pain Pain - Right/Left: Left Pain - part of body: Ankle and joints of foot    Time: 1204-1226 PT Time Calculation (min) (ACUTE ONLY): 22 min   Charges:   PT Evaluation $PT Eval Low Complexity: 1 Low            Faye Ramsay, PT Acute Rehabilitation  Office: [phone] Pager: [phone]

## 2019-11-18 NOTE — TOC Progression Note (Signed)
Transition of Care [facility]) - Progression Note    Patient Details  Name: Lynn Harvey MRN: [id number] Date of Birth: January 31, 1966  [facility]) CM/SW Contact  Geni Bers, RN Phone Number: 11/18/2019, 1:28 PM  Clinical Narrative:     Pt would benefit with OP PT related to insurance. Pt sleep at present time and did not awaken with her name being called. A call was made to husband to check on home health agency. Husband stated they do not come any more.  Will try to see pt again.   Expected Discharge Plan: Home/Self Care Barriers to Discharge: No Barriers Identified  Expected Discharge Plan and Services Expected Discharge Plan: Home/Self Care       Living arrangements for the past 2 months: Single Family Home                                       Social Determinants of Health (SDOH) Interventions    Readmission Risk Interventions No flowsheet data found.

## 2019-11-18 NOTE — Progress Notes (Signed)
°   11/18/19 1415  Vitals  Temp 98.4 F (36.9 C)  Temp Source Oral  BP (!) 88/61  MAP (mmHg) 70  BP Location Left Arm  BP Method Automatic  Patient Position (if appropriate) Lying  Pulse Rate 73  Resp 19  MEWS COLOR  MEWS Score Color Green  Oxygen Therapy  SpO2 99 %  O2 Device Room Air  MEWS Score  MEWS Temp 0  MEWS Systolic 1  MEWS Pulse 0  MEWS RR 0  MEWS LOC 0  MEWS Score 1  Provider Notification  Provider Name/Title Hazeline Junker MD  Date Provider Notified 11/18/19  Time Provider Notified 1419  Notification Type Page  Notification Reason Change in status  Response Other (Comment) (hold metoprolol; cont. LR @ 100)  Date of Provider Response 11/18/19  Time of Provider Response 1420   Patients BP 88/61, patient is asymptomatic. MD Jarvis Newcomer notified. Orders to hold metoprolol and continue LR.

## 2019-11-19 DIAGNOSIS — S92902A Unspecified fracture of left foot, initial encounter for closed fracture: Secondary | ICD-10-CM

## 2019-11-19 DIAGNOSIS — I2510 Atherosclerotic heart disease of native coronary artery without angina pectoris: Secondary | ICD-10-CM

## 2019-11-19 DIAGNOSIS — N390 Urinary tract infection, site not specified: Secondary | ICD-10-CM

## 2019-11-19 DIAGNOSIS — K219 Gastro-esophageal reflux disease without esophagitis: Secondary | ICD-10-CM

## 2019-11-19 DIAGNOSIS — B9620 Unspecified Escherichia coli [E. coli] as the cause of diseases classified elsewhere: Secondary | ICD-10-CM

## 2019-11-19 DIAGNOSIS — E871 Hypo-osmolality and hyponatremia: Secondary | ICD-10-CM

## 2019-11-19 DIAGNOSIS — F17210 Nicotine dependence, cigarettes, uncomplicated: Secondary | ICD-10-CM

## 2019-11-19 DIAGNOSIS — F1010 Alcohol abuse, uncomplicated: Secondary | ICD-10-CM

## 2019-11-19 DIAGNOSIS — E860 Dehydration: Secondary | ICD-10-CM

## 2019-11-19 DIAGNOSIS — N179 Acute kidney failure, unspecified: Principal | ICD-10-CM

## 2019-11-19 DIAGNOSIS — M3500 Sicca syndrome, unspecified: Secondary | ICD-10-CM

## 2019-11-19 DIAGNOSIS — I10 Essential (primary) hypertension: Secondary | ICD-10-CM

## 2019-11-19 LAB — BASIC METABOLIC PANEL
Anion gap: 10 (ref 5–15)
BUN: 10 mg/dL (ref 6–20)
CO2: 21 mmol/L — ABNORMAL LOW (ref 22–32)
Calcium: 8.6 mg/dL — ABNORMAL LOW (ref 8.9–10.3)
Chloride: 106 mmol/L (ref 98–111)
Creatinine, Ser: 1.03 mg/dL — ABNORMAL HIGH (ref 0.44–1.00)
GFR calc Af Amer: >60 mL/min (ref >60)
GFR calc non Af Amer: >60 mL/min (ref >60)
Glucose, Bld: 109 mg/dL — ABNORMAL HIGH (ref 70–99)
Potassium: 4.2 mmol/L (ref 3.5–5.1)
Sodium: 137 mmol/L (ref 135–145)

## 2019-11-19 LAB — CBC
HCT: 25.8 % — ABNORMAL LOW (ref 36.0–46.0)
Hemoglobin: 8.6 g/dL — ABNORMAL LOW (ref 12.0–15.0)
MCH: 36.4 pg — ABNORMAL HIGH (ref 26.0–34.0)
MCHC: 33.3 g/dL (ref 30.0–36.0)
MCV: 109.3 fL — ABNORMAL HIGH (ref 80.0–100.0)
Platelets: 177 10*3/uL (ref 150–400)
RBC: 2.36 MIL/uL — ABNORMAL LOW (ref 3.87–5.11)
RDW: 18.8 % — ABNORMAL HIGH (ref 11.5–15.5)
WBC: 5.1 10*3/uL (ref 4.0–10.5)
nRBC: 0 % (ref 0.0–0.2)

## 2019-11-19 LAB — URINE CULTURE: Culture: >=100000 — AB

## 2019-11-19 LAB — RETICULOCYTES
Immature Retic Fract: 11.8 % (ref 2.3–15.9)
RBC.: 2.34 MIL/uL — ABNORMAL LOW (ref 3.87–5.11)
Retic Count, Absolute: 131.3 10*3/uL (ref 19.0–186.0)
Retic Ct Pct: 5.6 % — ABNORMAL HIGH (ref 0.4–3.1)

## 2019-11-19 LAB — IRON AND TIBC
Iron: 70 ug/dL (ref 28–170)
Saturation Ratios: 21 % (ref 10.4–31.8)
TIBC: 335 ug/dL (ref 250–450)
UIBC: 265 ug/dL

## 2019-11-19 LAB — VITAMIN B12: Vitamin B-12: 459 pg/mL (ref 180–914)

## 2019-11-19 LAB — FOLATE: Folate: 84.9 ng/mL (ref >5.9)

## 2019-11-19 LAB — FERRITIN: Ferritin: 267 ng/mL (ref 11–307)

## 2019-11-19 MED ORDER — CEFDINIR 300 MG PO CAPS
300.0000 mg | ORAL_CAPSULE | Freq: Two times a day (BID) | ORAL | Status: DC
  Administered 2019-11-19: 300 mg via ORAL
  Filled 2019-11-19 (×2): qty 1

## 2019-11-19 MED ORDER — FOLIC ACID 1 MG PO TABS: 1.0000 mg | ORAL_TABLET | Freq: Every day | ORAL | Status: AC

## 2019-11-19 MED ORDER — METOPROLOL TARTRATE 25 MG PO TABS
25.0000 mg | ORAL_TABLET | Freq: Two times a day (BID) | ORAL | 2 refills | Status: DC
Start: 2019-11-19 — End: 2022-03-05

## 2019-11-19 MED ORDER — AMLODIPINE BESYLATE 5 MG PO TABS: 5.0000 mg | ORAL_TABLET | Freq: Every day | ORAL | 2 refills | Status: DC

## 2019-11-19 MED ORDER — ENSURE ENLIVE PO LIQD: 237.0000 mL | Freq: Two times a day (BID) | ORAL | Status: DC

## 2019-11-19 MED ORDER — NICOTINE 21 MG/24HR TD PT24: 21.0000 mg | MEDICATED_PATCH | Freq: Every day | TRANSDERMAL | 0 refills | Status: DC

## 2019-11-19 MED ORDER — THIAMINE HCL 100 MG PO TABS: 100.0000 mg | ORAL_TABLET | Freq: Every day | ORAL | Status: AC

## 2019-11-19 MED ORDER — PYRIDOXINE HCL 100 MG PO TABS: 100.0000 mg | ORAL_TABLET | Freq: Every day | ORAL | Status: AC

## 2019-11-19 MED ORDER — CEFDINIR 300 MG PO CAPS: 300.0000 mg | ORAL_CAPSULE | Freq: Two times a day (BID) | ORAL | 0 refills | Status: AC

## 2019-11-19 MED ORDER — VITAMIN B-6 100 MG PO TABS
100.0000 mg | ORAL_TABLET | Freq: Every day | ORAL | Status: DC
  Administered 2019-11-19: 100 mg via ORAL
  Filled 2019-11-19: qty 1

## 2019-11-19 NOTE — Progress Notes (Signed)
Pt discharged from hospital. Given and reviewed dc instructions with pt. Pt verbalized understanding of instructions. Given prescriptions per MD. Maeola Harman, RN

## 2019-11-19 NOTE — Progress Notes (Signed)
Physical Therapy Treatment Patient Details Name: Lynn Harvey MRN: [id number] DOB: 10-Dec-1965 Today's Date: 11/19/2019    History of Present Illness 54 yo female admitted with AKI. Hx of Sjogren's, CAD, NSTEMI, lupus, ETOH abuse, neuropathy, ADD, recent fall with foot fx-wearing CAM boot and using RW vs scooter.    PT Comments    General Comments: AxO x 3 pleasant "sleepy from Ativan" stated pt.  General Gait Details: pt able to Indep don Cam boot EOB.  Pt aware she is WBing thru heel only and need for walker.  Only amb to anf from bathroom "I'm leaving later today". Assisted back to bed to rest before D/C. Pt asked about Physical Therapy for her "Neuropathy" and "balance".  Advised her [facility] on [id number] has a Medical laboratory scientific officer.  Advised pt to pursue after her [facility] PT is completed.     Follow Up Recommendations  Home health PT     Equipment Recommendations  None recommended by PT    Recommendations for Other Services       Precautions / Restrictions Precautions Precautions: Fall Precaution Comments: CAM boot L foot Restrictions Other Position/Activity Restrictions: CAM boot and RW for ambulation per pt    Mobility  Bed Mobility Overal bed mobility: Modified Independent                Transfers Overall transfer level: Modified independent                  Ambulation/Gait Ambulation/Gait assistance: Modified independent (Device/Increase time);Supervision Gait Distance (Feet): 20 Feet Assistive device: Rolling walker (2 wheeled) Gait Pattern/deviations: Step-to pattern;Step-through pattern;Decreased stride length Gait velocity: WNL   General Gait Details: pt able to Indep don Cam boot EOB.  Pt aware she is WBing thru heel only and need for walker.  Only amb to anf from bathroom "I'm leaving later today".   Stairs             Wheelchair Mobility    Modified Rankin (Stroke Patients Only)       Balance                                             Cognition Arousal/Alertness: Awake/alert Behavior During Therapy: WFL for tasks assessed/performed Overall Cognitive Status: Within Functional Limits for tasks assessed                                 General Comments: AxO x 3 pleasant "sleepy from Ativan" stated pt      Exercises      General Comments        Pertinent Vitals/Pain Pain Assessment: No/denies pain    Home Living                      Prior Function            PT Goals (current goals can now be found in the care plan section) Progress towards PT goals: Progressing toward goals    Frequency    Min 3X/week      PT Plan Current plan remains appropriate    Co-evaluation              AM-PAC PT "6 Clicks" Mobility   Outcome Measure  Help needed turning from your back to your  side while in a flat bed without using bedrails?: None Help needed moving from lying on your back to sitting on the side of a flat bed without using bedrails?: None Help needed moving to and from a bed to a chair (including a wheelchair)?: None Help needed standing up from a chair using your arms (e.g., wheelchair or bedside chair)?: None Help needed to walk in hospital room?: None Help needed climbing 3-5 steps with a railing? : A Little 6 Click Score: 23    End of Session Equipment Utilized During Treatment: Gait belt Activity Tolerance: Patient tolerated treatment well Patient left: in bed;with call bell/phone within reach;with bed alarm set Nurse Communication: Mobility status PT Visit Diagnosis: History of falling (Z91.81);Difficulty in walking, not elsewhere classified (R26.2);Pain Pain - Right/Left: Left Pain - part of body: Ankle and joints of foot     Time: 1530-1550 PT Time Calculation (min) (ACUTE ONLY): 20 min  Charges:  $Gait Training: 8-22 mins                     Felecia Shelling  PTA Acute  Rehabilitation Services Pager      [phone] Office       [phone]

## 2019-11-19 NOTE — Discharge Summary (Addendum)
Physician Discharge Summary  Lynn Harvey RUE:[id number] DOB: 1965/02/28 DOA: 11/17/2019  PCP: [name], MD  Admit date: 11/17/2019 Discharge date: 11/19/2019  Time spent: 55 minutes  Recommendations for Outpatient Follow-up:  1. Follow-up with [name], MD in 1 to 2 weeks.  On follow-up patient will need a basic metabolic profile done to follow-up on electrolytes and renal function, magnesium level checked, CBC check to follow-up on H&H, blood pressure would need to be to be reassessed patient's ARB has been discontinued and patient started on Norvasc in addition to Lopressor during the hospitalization. 2. Outpatient follow-up with orthopedics as previously scheduled.   Discharge Diagnoses:  Principal Problem:   Acute kidney injury (HCC) Active Problems:   Essential hypertension   Coronary artery disease involving native coronary artery of native heart without angina pectoris   Iron deficiency anemia   Gastroesophageal reflux disease without esophagitis   Sjogren's syndrome (HCC)   Acute sensory neuropathy   Mixed hyperlipidemia   Nicotine dependence, cigarettes, uncomplicated   AKI (acute kidney injury) (HCC)   Unspecified fracture of left foot, initial encounter for closed fracture   Dehydration with hyponatremia   Alcohol abuse   Acute cystitis without hematuria   E. coli UTI (urinary tract infection)   Discharge Condition: Stable and improved  Diet recommendation: Regular  Filed Weights   11/17/19 1239  Weight: 48.5 kg    History of present illness:  HPI per Dr. 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  Patient explained that since her recent diagnosis of small fiber sensory neuropathy in August requiring hospitalization she has continued to experience tingling and pain of the extremities along with generalized weakness.  Patient blames this diagnosis on her mRNA vaccination completed in June.    Patient stated that she has followed with Dr. [name] with neurology in the outpatient setting who currently has her on IVIG infusions.  Her first infusion was the first week of September.  Patient stated that since her infusion she has felt nausea and generalized weakness.  Along with this nausea generalized weakness she has had progressively worsening appetite compared to her baseline.  Patient denied any fevers, dysuria, low back pain, diarrhea, abdominal pain, sick contacts, shortness of breath, cough or confirmed contact with COVID-19 infection.  Approximately 2 to 3 weeks ago, patient experienced an episode of loss of consciousness while attempting to take a bath.  Patient denied any symptoms prior to the sudden loss of consciousness and quickly regained consciousness.  Patient denied head trauma.  EMS did respond to that time but patient declined to come into the hospital.  In the weeks that followed, patient's generalized weakness and poor appetite persisted and worsened.  Patient also complains of associated lightheadedness on occasion without further episodes of loss of consciousness.  Patient eventually did see her primary care provider with the above-mentioned complaints.  Her primary care provider sent her for blood work and upon finding the patient was suffering from acute kidney injury instructed the patient to go to the emergency department for evaluation.  Upon evaluation in the emergency department patient has been confirmed to be suffering from acute kidney injury with creatinine of 2.08 compared to  baseline of 0.68.  Patient is also been found to have a leukocytosis of  10.6 with urinalysis suggestive of urinary tract infection.  The hospitalist group has been called to assess patient for admission to the hospital for management of acute kidney injury and complicating urinary tract infection.  Hospital Course:  1 acute kidney injury Patient sent to the ED due to acute kidney injury.  Creatinine noted to be 2.08 on admission compared to baseline of 0.68.  It was felt patient's acute kidney injury likely secondary to prerenal azotemia in the setting of ARB.  Patient hydrated with IV fluids, ARB discontinued.  Renal function improved such that by day of discharge creatinine was down to 1.03.  Patient's ARB has been discontinued on discharge.  Outpatient follow-up with PCP.  2.  E. coli UTI Urinalysis on admission concerning for UTI.  Patient noted to have a history of UTIs requiring treatment in the past and did not feel her similar symptoms as noted.  CT abdomen and pelvis showed a thickened bladder without pyelonephritis type changes.  Patient placed empirically on IV Rocephin pending urine culture results.  Urine cultures came back as > 100,000 of E. coli which was pansensitive.  Patient will be discharged home on 3 more days of oral Vantin to complete a 5-day course of antibiotic treatment.  Outpatient follow-up with PCP.  3.  Hypertension Patient's metoprolol and losartan were held during the hospitalization secondary to borderline blood pressure and acute kidney injury.  Patient's metoprolol was resumed.  Losartan discontinued on discharge secondary to problem #1.  Patient also started on Norvasc 5 mg daily.  Outpatient follow-up with PCP.  4.  Acute sensory neuropathy Recent diagnosis August 2021 while hospitalized at [facility] health.  Patient at that time was evaluated and diagnosed by neurology.  Patient received IVIG infusion during the hospitalization in addition to recent IV Ig infusion early on.  Patient noted to have low vitamin B 6 levels in July 2021 and as  such patient placed on oral vitamin B6 supplementation.  Patient follows up with Dr. [name], neurology in the outpatient setting.  Outpatient follow-up.  5.  Closed left foot fracture Lower extremity Dopplers done 11/18/2019 - for DVT.  Patient currently weightbearing as tolerated with boot, rolling walker.  Lower extremity Dopplers done were negative for DVT.  Patient placed on fall precautions.  Patient to be weightbearing as tolerated with boot, rolling walker.  Outpatient follow-up with orthopedics.  Outpatient PT ordered and recommended however per social work patient refusing outpatient PT.  Outpatient follow-up with PCP.   6.  Dehydration with hyponatremia Hyponatremia likely secondary to volume depletion in the setting of alcohol abuse.  Would also be potentially causing hyperosmolar hyponatremia.  Patient hydrated with IV fluids with clinical improvement as well as some improvement with hyponatremia.  7.  Sjogren's syndrome Patient maintained on home regimen of pilocarpine.  Outpatient follow-up.  8.  Tobacco abuse Tobacco cessation stressed to patient.  Patient placed on a nicotine patch.  Outpatient follow-up.  9.  Gastroesophageal reflux disease Patient maintained on a PPI.  10.  Alcohol abuse Patient was monitored on the Ativan withdrawal protocol.  Patient did not go through DTs during the hospitalization and remained stable.  11.  Coronary artery disease Remained stable.  Patient maintained on home regimen of aspirin, statin, beta-blocker.  Outpatient follow-up.  12.  History of iron deficiency anemia/anemia of chronic disease Patient noted to have anemia during the hospitalization with a downtrending of hemoglobin.  Etiology of anemia likely dilutional in nature.  Patient had no overt bleeding.  Patient denied any melanotic stools.  Anemia panel which was done was consistent with anemia of chronic disease.  Patient was also maintained on a PPI.  Outpatient follow-up with  PCP.  Procedures:  CT abdomen and pelvis 11/17/2019  Lower extremity Dopplers 11/18/2019    Consultations:  None  Discharge Exam: Vitals:   11/19/19 0605 11/19/19 1234  BP: 120/68 (!) 145/84  Pulse: 80 (!) 102  Resp:  20  Temp:  98.7 F (37.1 C)  SpO2:  99%    General: NAD Cardiovascular: RRR Respiratory: CTAB  Discharge Instructions   Discharge Instructions    Diet general   Complete by: As directed    Increase activity slowly   Complete by: As directed      Allergies as of 11/19/2019      Reactions   Buprenorphine Hcl Nausea And Vomiting   Doxycycline Nausea Only   Morphine Nausea And Vomiting   Morphine And Related Nausea And Vomiting   Ace Inhibitors Cough      Medication List    STOP taking these medications   losartan 50 MG tablet Commonly known as: COZAAR     TAKE these medications   acetaminophen-codeine 300-30 MG tablet Commonly known as: TYLENOL #3 Take 1 tablet by mouth every 8 (eight) hours as needed for moderate pain.   amLODipine 5 MG tablet Commonly known as: NORVASC Take 1 tablet (5 mg total) by mouth daily.   amphetamine-dextroamphetamine 15 MG tablet Commonly known as: ADDERALL Take 0.5 tablets by mouth 2 (two) times daily. What changed: how much to take   aspirin EC 81 MG tablet Take 81 mg by mouth daily.   cefdinir 300 MG capsule Commonly known as: OMNICEF Take 1 capsule (300 mg total) by mouth every 12 (twelve) hours for 3 days.   ezetimibe 10 MG tablet Commonly known as: ZETIA Take 1 tablet (10 mg total) by mouth daily.   folic acid 1 MG tablet Commonly known as: FOLVITE Take 1 tablet (1 mg total) by mouth daily. Start taking on: November 20, 2019   gabapentin 400 MG capsule Commonly known as: NEURONTIN Take 400 mg by mouth at bedtime. What changed: Another medication with the same name was removed. Continue taking this medication, and follow the directions you see here.   metoprolol tartrate 25 MG  tablet Commonly known as: LOPRESSOR Take 1 tablet (25 mg total) by mouth 2 (two) times daily.   multivitamin with minerals Tabs tablet Take 1 tablet by mouth daily.   nicotine 21 mg/24hr patch Commonly known as: NICODERM CQ - dosed in mg/24 hours Place 1 patch (21 mg total) onto the skin daily. Start taking on: November 20, 2019   nitroGLYCERIN 0.4 MG SL tablet Commonly known as: NITROSTAT Place 1 tablet (0.4 mg total) under the tongue every 5 (five) minutes as needed for chest pain.   omeprazole 20 MG capsule Commonly known as: PRILOSEC Take 20 mg by mouth 2 (two) times daily before a meal.   ondansetron 4 MG tablet Commonly known as: ZOFRAN Take 1 tablet (4 mg total) by mouth every 8 (eight) hours as needed for nausea or vomiting.   pilocarpine 5 MG tablet Commonly known as: SALAGEN Take 5 mg by mouth 3 (three) times daily.   promethazine 25 MG tablet Commonly known as: PHENERGAN Take 1 tablet (25 mg total) by mouth every 6 (six) hours as needed for nausea or  vomiting.   pyridoxine 100 MG tablet Commonly known as: B-6 Take 1 tablet (100 mg total) by mouth daily.   rOPINIRole 0.5 MG tablet Commonly known as: REQUIP Take 1 tablet (0.5 mg total) by mouth at bedtime.   thiamine 100 MG tablet Take 1 tablet (100 mg total) by mouth daily. Start taking on: November 20, 2019   valACYclovir 500 MG tablet Commonly known as: VALTREX Take 500 mg by mouth 3 (three) times daily as needed (fever blister).   zolpidem 5 MG tablet Commonly known as: AMBIEN Take 5 mg by mouth at bedtime.      Allergies  Allergen Reactions  . Buprenorphine Hcl Nausea And Vomiting  . Doxycycline Nausea Only  . Morphine Nausea And Vomiting  . Morphine And Related Nausea And Vomiting  . Ace Inhibitors Cough    Follow-up Information    [name], MD. Schedule an appointment as soon as possible for a visit in 1 week(s).   Specialty: Internal Medicine Why: f/u in 1-2 weeks. Contact  information: [address] [place] [address] [phone]        [name], MD .   Specialty: Cardiology Contact information: [address] [place] [address] [phone]                The results of significant diagnostics from this hospitalization (including imaging, microbiology, ancillary and laboratory) are listed below for reference.    Significant Diagnostic Studies: CT ABDOMEN PELVIS WO CONTRAST  Result Date: 11/17/2019 CLINICAL DATA:  Acute kidney injury, elevated BUN, creatinine and GFR EXAM: CT ABDOMEN AND PELVIS WITHOUT CONTRAST TECHNIQUE: Multidetector CT imaging of the abdomen and pelvis was performed following the standard protocol without IV contrast. COMPARISON:  CT 04/08/2019 FINDINGS: Lower chest: Lung bases are clear. Normal heart size. No pericardial effusion. Few coronary artery calcifications. Hepatobiliary: No visible liver lesion on this unenhanced CT. Smooth surface contour. Normal liver attenuation. Post cholecystectomy. No biliary dilatation or intraductal gallstones. Pancreas: Unremarkable. No pancreatic ductal dilatation or surrounding inflammatory changes. Spleen: Normal in size. No concerning splenic lesions. Adrenals/Urinary Tract: Normal adrenal glands. Kidneys are symmetric in size and normally located. No visible concerning renal lesions. Punctate calcification in the upper pole left kidney has an appearance more suggestive of a vascular calcification on the coronal image. No obstructive urolithiasis or hydronephrosis. Urinary bladder is largely decompressed at the time of exam and therefore poorly evaluated by CT imaging. Mild bladder wall thickening is nonspecific in the setting of underdistention. Stomach/Bowel: Distal esophagus, stomach and duodenal sweep are unremarkable. No small bowel wall thickening or dilatation. No evidence of obstruction. A normal appendix is visualized. No colonic dilatation or  wall thickening. Intramural fat within the cecum is a nonspecific finding which can be seen as sequela of prior inflammatory bowel disease or lipomatous body habitus. Vascular/Lymphatic: Atherosclerotic calcifications within the abdominal aorta and branch vessels. No aneurysm or ectasia. No enlarged abdominopelvic lymph nodes. Reproductive: Uterus is surgically absent. No concerning adnexal lesions. Other: No abdominopelvic free fluid or free gas. No bowel containing hernias. Small fat containing umbilical hernia. Musculoskeletal: Mild multilevel degenerative changes in the spine including a partially calcified right central disc protrusion L5-S1 resulting in mild canal stenosis. Unchanged grade 1 anterolisthesis L4 on 5 without associated spondylolysis. Mild degenerative changes in the hips and pelvis as well as small benign synovial pits in the right femoral neck. No acute osseous abnormality or suspicious osseous lesion. IMPRESSION: 1. No obstructive  urolithiasis or hydronephrosis. No other acute CT abnormality to provide cause for patient's symptoms. 2. Mild bladder wall thickening is nonspecific in the setting of underdistention. Recommend correlation with urinalysis to exclude cystitis. 3. Intramural fat within the cecum is a nonspecific finding which can be seen as sequela of prior inflammatory bowel disease or lipomatous body habitus. 4. Mild multilevel degenerative changes in the spine including a partially calcified right central disc protrusion L5-S1 resulting in mild canal stenosis. 5. Aortic Atherosclerosis (ICD10-I70.0). Electronically Signed   By: Kreg Shropshire M.D.   On: 11/17/2019 20:30   VAS [place] LOWER EXTREMITY VENOUS (DVT)  Result Date: 11/19/2019  Lower Venous DVTStudy Indications: Swelling, and s/p fracture LT foot.  Comparison Study: No prior studies. Performing Technologist: Jean Rosenthal  Examination Guidelines: A complete evaluation includes B-mode imaging, spectral Doppler, color Doppler,  and power Doppler as needed of all accessible portions of each vessel. Bilateral testing is considered an integral part of a complete examination. Limited examinations for reoccurring indications may be performed as noted. The reflux portion of the exam is performed with the patient in reverse Trendelenburg.  +-----+---------------+---------+-----------+----------+--------------+ RIGHTCompressibilityPhasicitySpontaneityPropertiesThrombus Aging +-----+---------------+---------+-----------+----------+--------------+ CFV  Full           Yes      Yes                                 +-----+---------------+---------+-----------+----------+--------------+   +---------+---------------+---------+-----------+----------+--------------+ LEFT     CompressibilityPhasicitySpontaneityPropertiesThrombus Aging +---------+---------------+---------+-----------+----------+--------------+ CFV      Full           Yes      Yes                                 +---------+---------------+---------+-----------+----------+--------------+ SFJ      Full                                                        +---------+---------------+---------+-----------+----------+--------------+ FV Prox  Full                                                        +---------+---------------+---------+-----------+----------+--------------+ FV Mid   Full                                                        +---------+---------------+---------+-----------+----------+--------------+ FV DistalFull                                                        +---------+---------------+---------+-----------+----------+--------------+ PFV      Full                                                        +---------+---------------+---------+-----------+----------+--------------+  POP      Full           Yes      Yes                                  +---------+---------------+---------+-----------+----------+--------------+ PTV      Full                                                        +---------+---------------+---------+-----------+----------+--------------+ PERO     Full                                                        +---------+---------------+---------+-----------+----------+--------------+     Summary: RIGHT: - No evidence of common femoral vein obstruction.  LEFT: - There is no evidence of deep vein thrombosis in the lower extremity.  - No cystic structure found in the popliteal fossa.  *See table(s) above for measurements and observations. Electronically signed by Sherald Hess MD on 11/19/2019 at 2:02:37 PM.    Final     Microbiology: Recent Results (from the past 240 hour(s))  Culture, Urine     Status: Abnormal   Collection Time: 11/17/19  4:08 PM   Specimen: Urine, Random  Result Value Ref Range Status   Specimen Description   Final    URINE, RANDOM Performed at [facility], [address] [place], [place] [address]    Special Requests   Final    NONE Performed at [facility], [address] [place], [place] [address]    Culture >=100,000 COLONIES/mL ESCHERICHIA COLI (A)  Final   Report Status 11/19/2019 FINAL  Final   Organism ID, Bacteria ESCHERICHIA COLI (A)  Final      Susceptibility   Escherichia coli - MIC*    AMPICILLIN <=2 SENSITIVE Sensitive     CEFAZOLIN <=4 SENSITIVE Sensitive     CEFTRIAXONE <=0.25 SENSITIVE Sensitive     CIPROFLOXACIN <=0.25 SENSITIVE Sensitive     GENTAMICIN <=1 SENSITIVE Sensitive     IMIPENEM <=0.25 SENSITIVE Sensitive     NITROFURANTOIN <=16 SENSITIVE Sensitive     TRIMETH/SULFA <=20 SENSITIVE Sensitive     AMPICILLIN/SULBACTAM <=2 SENSITIVE Sensitive     PIP/TAZO <=4 SENSITIVE Sensitive     * >=100,000 COLONIES/mL ESCHERICHIA COLI  Blood culture (routine x 2)     Status: None (Preliminary result)   Collection  Time: 11/17/19  5:54 PM   Specimen: BLOOD  Result Value Ref Range Status   Specimen Description   Final    BLOOD RIGHT ANTECUBITAL Performed at [facility], [address] [place], [place] [address]    Special Requests   Final    BOTTLES DRAWN AEROBIC AND ANAEROBIC Blood Culture adequate volume Performed at [facility], [address], [place], [place] [address]    Culture   Final    NO GROWTH 2 DAYS Performed at [facility], [address] [place], [place] [address]    Report Status PENDING  Incomplete  Respiratory Panel by RT PCR (Flu A&B, Covid) -  Nasopharyngeal Swab     Status: None   Collection Time: 11/17/19  5:54 PM   Specimen: Nasopharyngeal Swab  Result Value Ref Range Status   SARS Coronavirus 2 by RT PCR NEGATIVE NEGATIVE Final    Comment: (NOTE) SARS-CoV-2 target nucleic acids are NOT DETECTED.  The SARS-CoV-2 RNA is generally detectable in upper respiratoy specimens during the acute phase of infection. The lowest concentration of SARS-CoV-2 viral copies this assay can detect is 131 copies/mL. A negative result does not preclude SARS-Cov-2 infection and should not be used as the sole basis for treatment or other patient management decisions. A negative result may occur with  improper specimen collection/handling, submission of specimen other than nasopharyngeal swab, presence of viral mutation(s) within the areas targeted by this assay, and inadequate number of viral copies (<131 copies/mL). A negative result must be combined with clinical observations, patient history, and epidemiological information. The expected result is Negative.  Fact Sheet for Patients:  https://www.moore.com/  Fact Sheet for Healthcare Providers:  https://www.young.biz/  This test is no t yet approved or cleared by the [place] FDA and  has been authorized for detection and/or diagnosis of  SARS-CoV-2 by FDA under an Emergency Use Authorization (EUA). This EUA will remain  in effect (meaning this test can be used) for the duration of the COVID-19 declaration under Section 564(b)(1) of the Act, 21 U.S.C. section 360bbb-3(b)(1), unless the authorization is terminated or revoked sooner.     Influenza A by PCR NEGATIVE NEGATIVE Final   Influenza B by PCR NEGATIVE NEGATIVE Final    Comment: (NOTE) The Xpert Xpress SARS-CoV-2/FLU/RSV assay is intended as an aid in  the diagnosis of influenza from Nasopharyngeal swab specimens and  should not be used as a sole basis for treatment. Nasal washings and  aspirates are unacceptable for Xpert Xpress SARS-CoV-2/FLU/RSV  testing.  Fact Sheet for Patients: https://www.moore.com/  Fact Sheet for Healthcare Providers: https://www.young.biz/  This test is not yet approved or cleared by the [place] FDA and  has been authorized for detection and/or diagnosis of SARS-CoV-2 by  FDA under an Emergency Use Authorization (EUA). This EUA will remain  in effect (meaning this test can be used) for the duration of the  Covid-19 declaration under Section 564(b)(1) of the Act, 21  U.S.C. section 360bbb-3(b)(1), unless the authorization is  terminated or revoked. Performed at [facility], [address] [place], [place] [address]   Blood culture (routine x 2)     Status: None (Preliminary result)   Collection Time: 11/17/19  6:01 PM   Specimen: BLOOD RIGHT HAND  Result Value Ref Range Status   Specimen Description   Final    BLOOD RIGHT HAND Performed at [facility], [address], [place], [place] [address]    Special Requests   Final    BOTTLES DRAWN AEROBIC ONLY Blood Culture results may not be optimal due to an inadequate volume of blood received in culture bottles Performed at [facility], [address], [place], [place] [address]     Culture   Final    NO GROWTH 2 DAYS Performed at [facility], [address], [place], [place] [address]    Report Status PENDING  Incomplete     Labs: Basic Metabolic Panel: Recent Labs  Lab 11/17/19 1456 11/18/19 0410 11/19/19 1211  NA 132* 136 137  K 4.9 3.9 4.2  CL 97* 103 106  CO2 26 24 21*  GLUCOSE 96 101* 109*  BUN 30* 21* 10  CREATININE 2.08* 1.24* 1.03*  CALCIUM 8.7* 8.7* 8.6*  MG  --  2.0  --    Liver Function Tests: Recent Labs  Lab 11/17/19 1456 11/18/19 0410  AST 49* 48*  ALT 29 26  ALKPHOS 92 93  BILITOT 0.8 0.6  PROT 7.2 6.9  ALBUMIN 2.9* 3.0*   No results for input(s): LIPASE, AMYLASE in the last 168 hours. No results for input(s): AMMONIA in the last 168 hours. CBC: Recent Labs  Lab 11/17/19 1336 11/18/19 0410 11/19/19 1211  WBC 10.6* 6.9 5.1  NEUTROABS 7.2 4.3  --   HGB 11.1* 9.4* 8.6*  HCT 31.9* 27.7* 25.8*  MCV 106.7* 109.1* 109.3*  PLT 350 232 177   Cardiac Enzymes: No results for input(s): CKTOTAL, CKMB, CKMBINDEX, TROPONINI in the last 168 hours. BNP: BNP (last 3 results) No results for input(s): BNP in the last 8760 hours.  ProBNP (last 3 results) No results for input(s): PROBNP in the last 8760 hours.  CBG: No results for input(s): GLUCAP in the last 168 hours.     Signed:  Ramiro Harvest MD.  [facility] 11/19/2019, 3:35 PM

## 2019-11-19 NOTE — TOC Progression Note (Signed)
Transition of Care [facility]) - Progression Note    Patient Details  Name: Lynn Harvey MRN: [id number] Date of Birth: 03-26-1965  [facility]) CM/SW Contact  Geni Bers, RN Phone Number: 11/19/2019, 12:20 PM  Clinical Narrative:     Pt was given resources for ETOH/ Substance Abuse.    Expected Discharge Plan: Home/Self Care Barriers to Discharge: No Barriers Identified  Expected Discharge Plan and Services Expected Discharge Plan: Home/Self Care       Living arrangements for the past 2 months: Single Family Home                                       Social Determinants of Health (SDOH) Interventions    Readmission Risk Interventions No flowsheet data found.

## 2019-11-19 NOTE — Plan of Care (Signed)
  Problem: Education: Goal: Knowledge of General Education information will improve Description: Including pain rating scale, medication(s)/side effects and non-pharmacologic comfort measures Outcome: Completed/Met

## 2019-11-19 NOTE — TOC Progression Note (Signed)
Transition of Care [facility]) - Progression Note    Patient Details  Name: Lynn Harvey MRN: [id number] Date of Birth: [birth year]  Transition of [facility]) CM/SW Contact  Geni Bers, RN Phone Number: 11/19/2019, 10:48 AM  Clinical Narrative:    Pt states that she will not need Home Health at present time.    Expected Discharge Plan: Home/Self Care Barriers to Discharge: No Barriers Identified  Expected Discharge Plan and Services Expected Discharge Plan: Home/Self Care       Living arrangements for the past 2 months: Single Family Home                                       Social Determinants of Health (SDOH) Interventions    Readmission Risk Interventions No flowsheet data found.

## 2019-11-19 NOTE — Progress Notes (Signed)
Occupational Therapy Evaluation  Patient with functional deficits listed below impacting safety and independence with self care. Patient reports 1 fall recently resulting in L broken foot and has been wearing CAM boot ~1 month. Patient reporting neuropathy in B hands/feet, initiate education to minimize fall risks 2* neuropathy. Patient primarily supervision level with rolling walker, min cues for hand placement. Recommend HH due to transportation issues for OP OT services for further treatment of neuropathy. Will continue to follow acutely.    11/19/19 0835  OT Visit Information  Last OT Received On 11/19/19  Assistance Needed +1  History of Present Illness 54 yo female admitted with AKI. Hx of Sjogren's, CAD, NSTEMI, lupus, ETOH abuse, neuropathy, ADD, recent fall with foot fx-wearing CAM boot and using RW vs scooter.  Precautions  Precautions Fall  Precaution Comments CAM boot L foot  Restrictions  Other Position/Activity Restrictions CAM boot and RW for ambulation per pt  Home Living  Family/patient expects to be discharged to: Private residence  Research officer, trade [employer];Other relatives;Spouse/significant other  Available Help at Discharge Family  Type of Home Mobile home  Home Access Stairs to enter  Entrance Stairs-Number of Steps 5  Entrance Stairs-Rails Can reach both;Left;Right  Home Layout One level  Bathroom Shower/Tub Walk-in shower;Tub/shower unit  Pension scheme manager - 2 wheels;BSC  Prior Function  Level of Independence Independent  Communication  Communication No difficulties  Pain Assessment  Pain Assessment Faces  Faces Pain Scale 4  Pain Location L foot and headache  Pain Descriptors / Indicators Aching;Discomfort  Pain Intervention(s) Patient requesting pain meds-RN notified  Cognition  Arousal/Alertness Awake/alert  Behavior During Therapy WFL for tasks assessed/performed  Overall Cognitive Status Within Functional  Limits for tasks assessed  Upper Extremity Assessment  Upper Extremity Assessment RUE deficits/detail;LUE deficits/detail  RUE Deficits / Details patient reports neuropathy from COVID shots, has bilateral numbness and burning in hands + feet  Lower Extremity Assessment  Lower Extremity Assessment Defer to PT evaluation  ADL  Overall ADL's  Needs assistance/impaired  Grooming Set up;Sitting  Upper Body Bathing Set up;Sitting  Lower Body Bathing Supervison/ safety;Sit to/from stand  Upper Body Dressing  Set up;Sitting  Lower Body Dressing Supervision/safety;Sitting/lateral leans;Sit to/from stand  Lower Body Dressing Details (indicate cue type and reason) to don cam boot and sock seated EOB, increased time due to neuropathy  Toilet Transfer Supervision/safety;Ambulation;RW  Toilet Transfer Details (indicate cue type and reason) simulated with functional ambulation, supervision for safety due to fall hx, patient reporting weakness in R LE "from the neuropathy" min cues for hand placement when standing with walker  Toileting- Clothing Manipulation and Hygiene Supervision/safety;Sit to/from stand  Functional mobility during ADLs Supervision/safety  Bed Mobility  Overal bed mobility Modified Independent  Transfers  Overall transfer level Needs assistance  Equipment used Rolling walker (2 wheeled)  Transfers Sit to/from Stand  Sit to Stand Supervision  General transfer comment VCs hand placement.  Balance  Overall balance assessment History of Falls  OT - End of Session  Equipment Utilized During Treatment Rolling walker  Activity Tolerance Patient tolerated treatment well  Patient left in bed;with call bell/phone within reach  Nurse Communication Mobility status  OT Assessment  OT Recommendation/Assessment Patient needs continued OT Services  OT Visit Diagnosis Pain  Pain - Right/Left Left  Pain - part of body Leg  OT Problem List Impaired balance (sitting and/or standing);Decreased  activity tolerance;Impaired sensation;Pain  OT Plan  OT Frequency (ACUTE ONLY) Min 2X/week  OT Treatment/Interventions (ACUTE ONLY) Self-care/ADL training;Energy conservation;Therapeutic activities;Patient/family education;Balance training;Therapeutic exercise  AM-PAC OT "6 Clicks" Daily Activity Outcome Measure (Version 2)  Help from another person eating meals? 4  Help from another person taking care of personal grooming? 3  Help from another person toileting, which includes using toliet, bedpan, or urinal? 3  Help from another person bathing (including washing, rinsing, drying)? 3  Help from another person to put on and taking off regular upper body clothing? 3  Help from another person to put on and taking off regular lower body clothing? 3  6 Click Score 19  OT Recommendation  Follow Up Recommendations Home health OT;Other (comment) (limited transportation for OP OT for neuropathy)  OT Equipment None recommended by OT  Individuals Consulted  Consulted and Agree with Results and Recommendations Patient  Acute Rehab OT Goals  Patient Stated Goal help for neuropathy  OT Goal Formulation With patient  Time For Goal Achievement 12/03/19  Potential to Achieve Goals Good  OT Time Calculation  OT Start Time (ACUTE ONLY) 0735  OT Stop Time (ACUTE ONLY) 0755  OT Time Calculation (min) 20 min  OT General Charges  $OT Visit 1 Visit  OT Evaluation  $OT Eval Low Complexity 1 Low  Written Expression  Dominant Hand Right   Marlyce Huge OT OT pager: [phone]

## 2019-11-19 NOTE — Progress Notes (Signed)
Initial Nutrition Assessment  RD working remotely.  DOCUMENTATION CODES:   Not applicable  INTERVENTION:  - will order Ensure Enlive BID, each supplement provides 350 kcal and 20 grams of protein - will complete NFPE at follow-up.  NUTRITION DIAGNOSIS:   Inadequate oral intake related to acute illness, poor appetite as evidenced by per patient/family report.  GOAL:   Patient will meet greater than or equal to 90% of their needs  MONITOR:   PO intake, Supplement acceptance, Labs, Weight trends  REASON FOR ASSESSMENT:   Malnutrition Screening Tool  ASSESSMENT:   54 y.o. 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 She presented to the ED secondary to fatigue, malaise, nausea, generalized weakness, and very poor appetite. She was found to have an AKI thought to be 2/2 dehydration.  No intakes documented since admission. Weight on 9/28 was 107 lb and weight on 09/18/19 was 123 lb. This indicates 16 lb weight loss (13% body weight) in the past 2 months; significant for time frame.   Per notes: - AKI - acute cystitis--thickened bladder without pyelonephritis  - acute sensory neuropathy dx in 09/2019 - closed L foot fx - dehydration on admission   Labs reviewed; creatinine: 1.03 mg/dl, Ca: 8.6 mg/dl.  Medications reviewed; 1 mg folvite/day, 1 tablet multivitamin with minerals/day, 20 mg protonix BID, 100 mg vitamin B6/day, 100 mg oral thiamine/day.     NUTRITION - FOCUSED PHYSICAL EXAM:  unable to complete at this time.   Diet Order:   Diet Order            Diet regular Room service appropriate? Yes; Fluid consistency: Thin  Diet effective now                 EDUCATION NEEDS:   No education needs have been identified at this time  Skin:  Skin Assessment: Reviewed RN Assessment  Last BM:  9/27  Height:   Ht  Readings from Last 1 Encounters:  11/17/19 5\' 3"  (1.6 m)    Weight:   Wt Readings from Last 1 Encounters:  11/17/19 48.5 kg    Estimated Nutritional Needs:  Kcal:  1695-1940 kcal Protein:  60-75 grams Fluid:  >/= 2 L/day     11/19/19, MS, RD, LDN, CNSC Inpatient Clinical Dietitian RD pager # available in [facility]  After hours/weekend pager # available in [facility]

## 2019-11-20 NOTE — TOC Progression Note (Signed)
Transition of Care [facility]) - Progression Note    Patient Details  Name: Lynn Harvey MRN: [id number] Date of Birth: 08-06-1965  Transition of Care [facility]) CM/SW Contact  Geni Bers, RN Phone Number: 11/20/2019, 4:12 PM  Clinical Narrative:     Pt called to ask about her medications.  Expected Discharge Plan: Home/Self Care Barriers to Discharge: No Barriers Identified  Expected Discharge Plan and Services Expected Discharge Plan: Home/Self Care       Living arrangements for the past 2 months: Single Family Home Expected Discharge Date: 11/19/19                                     Social Determinants of Health (SDOH) Interventions    Readmission Risk Interventions No flowsheet data found.

## 2019-11-22 LAB — CULTURE, BLOOD (ROUTINE X 2)
Culture: NO GROWTH
Culture: NO GROWTH
Special Requests: ADEQUATE

## 2019-11-23 ENCOUNTER — Telehealth: Payer: Self-pay | Admitting: Neurology

## 2019-11-23 NOTE — Telephone Encounter (Signed)
I called the pt. She stated since she received IVIG in the hospital, she received it once at home about 4 weeks ago. Pt stated during the infusion at home both days she had burning in her hands and feet like the neuropathy was getting worse. She stated she was then in bed for a week with stomach issues, couldn't eat, stomach hurting. Pt states she is supposed to have IVIG again this weekend and isn't sure she wants to put another thing "in my body". Pt also stated when EMS came to see her the night of the bathtub incident her BP was 77/53 and she was told not to take her BP meds. Pt stated she saw PCP and mentioned the bathtub passing out incident and she had labs checked. She was advised to follow up with Dr [name] to r/o seizure. She was also told to go to the ER because her "kidneys were failing". She was d/c from hospital with Abx for kidney infection. Pt stated in the hospital her SBP was in the 70s. She was told in the ER the incident in the bathtub might have been from low BP.  Pt isn't sure she needs to be seen by Dr [name] to r/o seizures since the episode may have been from low BP. Pt also wanted Dr [name] to know that all alcohol has been removed from her house. She quit drinking. Her husband is detoxing in the hospital.

## 2019-11-23 NOTE — Telephone Encounter (Signed)
Pt called, last infusion my neuropathy was really bad and infusion did not really help. Just got out of the hospital for a kidney infection. Not sure if I should get the infusion this weekend. Would like a call from the nurse.

## 2019-11-23 NOTE — Telephone Encounter (Signed)
I donn't think it was a seizure, she was extremely hypotensive which is the likely cause. If the IVIG is not helping please place order to stop it.  I am not sure we can do anything more for her we may need to refer her to [facility] or [facility] if we have not done that already. Please discuss with patient I think she needs a higher level of care. thanks

## 2019-11-24 NOTE — Telephone Encounter (Signed)
That's fine. thanks

## 2019-11-24 NOTE — Telephone Encounter (Signed)
I spoke with the pt and discussed the message from Dr [name]. Pt verbalized understanding. She did say she is better with her neuropathy now, it was just when the IVIG was going in, and then with her other health issues she felt she may want to a week or so. I let her know she can call the IVIG home nursing team to push out her next infusion by 1-2 weeks if she would like to do that and then call us if she feels it's no longer working. The pt stated she had called them about rescheduling. I let her know I would notify Dr [name] of her plan and would call if needed. Pt verbalized appreciation for the call.

## 2020-01-08 ENCOUNTER — Other Ambulatory Visit: Payer: Self-pay | Admitting: Student

## 2020-01-08 DIAGNOSIS — S92352K Displaced fracture of fifth metatarsal bone, left foot, subsequent encounter for fracture with nonunion: Secondary | ICD-10-CM

## 2020-01-11 ENCOUNTER — Telehealth: Payer: Self-pay | Admitting: Neurology

## 2020-01-11 NOTE — Telephone Encounter (Signed)
[facility]) IVIG Pt was not feeling well and she had the flu. Would physician like for her to get infusion now or in 4 weeks. Can contact [phone]

## 2020-01-11 NOTE — Telephone Encounter (Signed)
I would wait until she has recovered so more likely in 4 weeks

## 2020-01-11 NOTE — Telephone Encounter (Signed)
I returned leslie's call at [facility] [phone] and spoke with Onalee Hua. He took the message from Dr [name] that she advises pt wait to have the IVIG until pt has recovered so more likely in 4 weeks. Onalee Hua took the order for pt to skip day 2 of her IVIG. He verbalized appreciation for the call.

## 2020-01-26 ENCOUNTER — Ambulatory Visit
Admission: RE | Admit: 2020-01-26 | Discharge: 2020-01-26 | Disposition: A | Discharge status: Home / Self Care | Payer: 59 | Source: Ambulatory Visit | Attending: Student | Admitting: Student

## 2020-01-26 DIAGNOSIS — S92352K Displaced fracture of fifth metatarsal bone, left foot, subsequent encounter for fracture with nonunion: Secondary | ICD-10-CM

## 2020-02-04 ENCOUNTER — Encounter: Payer: Self-pay | Admitting: Cardiovascular Disease

## 2020-02-04 ENCOUNTER — Other Ambulatory Visit: Payer: Self-pay

## 2020-02-04 ENCOUNTER — Telehealth: Payer: Self-pay | Admitting: Cardiovascular Disease

## 2020-02-04 ENCOUNTER — Ambulatory Visit (INDEPENDENT_AMBULATORY_CARE_PROVIDER_SITE_OTHER): Payer: 59 | Admitting: Cardiovascular Disease

## 2020-02-04 DIAGNOSIS — I2510 Atherosclerotic heart disease of native coronary artery without angina pectoris: Secondary | ICD-10-CM | POA: Diagnosis not present

## 2020-02-04 DIAGNOSIS — I214 Non-ST elevation (NSTEMI) myocardial infarction: Secondary | ICD-10-CM

## 2020-02-04 DIAGNOSIS — G619 Inflammatory polyneuropathy, unspecified: Secondary | ICD-10-CM

## 2020-02-04 DIAGNOSIS — F1011 Alcohol abuse, in remission: Secondary | ICD-10-CM

## 2020-02-04 DIAGNOSIS — E782 Mixed hyperlipidemia: Secondary | ICD-10-CM | POA: Diagnosis not present

## 2020-02-04 DIAGNOSIS — I10 Essential (primary) hypertension: Secondary | ICD-10-CM | POA: Diagnosis not present

## 2020-02-04 DIAGNOSIS — Z720 Tobacco use: Secondary | ICD-10-CM

## 2020-02-04 DIAGNOSIS — D539 Nutritional anemia, unspecified: Secondary | ICD-10-CM

## 2020-02-04 DIAGNOSIS — Z9861 Coronary angioplasty status: Secondary | ICD-10-CM | POA: Diagnosis not present

## 2020-02-04 MED ORDER — NITROGLYCERIN 0.4 MG SL SUBL: 0.4000 mg | SUBLINGUAL_TABLET | SUBLINGUAL | 3 refills | Status: DC | PRN

## 2020-02-04 NOTE — Patient Instructions (Signed)
Medication Instructions:  No changes *If you need a refill on your cardiac medications before your next appointment, please call your pharmacy*   Lab Work: Lab work with your PCP (chemistry, cbc, lipids, B12) If you have labs (blood work) drawn today and your tests are completely normal, you will receive your results only by: Marland Kitchen MyChart Message (if you have MyChart) OR . A paper copy in the mail If you have any lab test that is abnormal or we need to change your treatment, we will call you to review the results.   Testing/Procedures: None ordered   Follow-Up: At [facility], you and your health needs are our priority.  As part of our continuing mission to provide you with exceptional heart care, we have created designated Provider Care Teams.  These Care Teams include your primary Cardiologist (physician) and Advanced Practice Providers (APPs -  Physician Assistants and Nurse Practitioners) who all work together to provide you with the care you need, when you need it.  We recommend signing up for the patient portal called "MyChart".  Sign up information is provided on this After Visit Summary.  MyChart is used to connect with patients for Virtual Visits (Telemedicine).  Patients are able to view lab/test results, encounter notes, upcoming appointments, etc.  Non-urgent messages can be sent to your provider as well.   To learn more about what you can do with MyChart, go to [email or web address].    Your next appointment:   6 month(s)  The format for your next appointment:   In Person  Provider:   Nicki Guadalajara, MD   Other Instructions None
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
## 2020-02-04 NOTE — Telephone Encounter (Signed)
*  STAT* If patient is at the pharmacy, call can be transferred to refill team.   1. Which medications need to be refilled? (please list name of each medication and dose if known) Nirroglycern  2. Which pharmacy/location (including street and city if local pharmacy) is medication to be sent to? [facility] [address]  3. Do they need a 30 day or 90 day supply? Not sure

## 2020-02-06 ENCOUNTER — Encounter: Payer: Self-pay | Admitting: Cardiovascular Disease

## 2020-02-10 ENCOUNTER — Other Ambulatory Visit: Payer: Self-pay | Admitting: Cardiovascular Disease

## 2020-04-25 ENCOUNTER — Telehealth: Payer: Self-pay | Admitting: *Deleted

## 2020-04-25 NOTE — Telephone Encounter (Signed)
Pt scheduled for 03/10 at 1:30 pm with Butch Penny, NP.

## 2020-04-25 NOTE — Telephone Encounter (Signed)
[facility] has reached out. They need a copy of the latest office visit for the insurance purposes. Pt due to a follow up appointment.

## 2020-04-28 ENCOUNTER — Encounter: Payer: Self-pay | Admitting: Adult Health

## 2020-04-28 ENCOUNTER — Ambulatory Visit (INDEPENDENT_AMBULATORY_CARE_PROVIDER_SITE_OTHER): Payer: 59 | Admitting: Adult Health

## 2020-04-28 ENCOUNTER — Other Ambulatory Visit: Payer: Self-pay

## 2020-04-28 VITALS — BP 113/66 | HR 78 | Ht 63.0 in | Wt 113.0 lb

## 2020-04-28 DIAGNOSIS — G619 Inflammatory polyneuropathy, unspecified: Secondary | ICD-10-CM | POA: Diagnosis not present

## 2020-04-28 MED ORDER — GABAPENTIN 400 MG PO CAPS: 400.0000 mg | ORAL_CAPSULE | Freq: Three times a day (TID) | ORAL | 3 refills | Status: DC

## 2020-04-28 NOTE — Progress Notes (Signed)
PATIENT: Lynn Harvey DOB: August 10, 1965  REASON FOR VISIT: follow up HISTORY FROM: patient  HISTORY OF PRESENT ILLNESS: Today 04/28/20:  Lynn Harvey is a 55 year old female with a history of inflammatory neuropathy.  She returns today for follow-up.  She remains on gabapentin 400 mg 3 times a day.  She reports that her balance has improved.  She feels it is back to her baseline.  She continues to get numbness and sharp shooting pain in the hands and legs.  She does feel that gabapentin has offered her improvement.  She states that if she misses the gabapentin dose then the pain returns.  She continues on IVIG.  She follows with rheumatology.  She is not drink alcohol since September.  HISTORY CC: "Numbness, tingling, pain everywhere, feet hands at the worst, sharp lightning pains, all started right after 2nd Covid shot "  HPI:  Lynn Harvey is a 55 y.o. female here as requested by [name], MD for gait abnormality.  Past medical history hot flashes, hypertension, bilateral carotid artery disease, mouth dryness, sicca syndrome, GERD, fatty liver, intractable vomiting with nausea, hyperlipidemia, elevated glucose, recurrent major depression, generalized anxiety disorder, edema, insomnia, Sjogren's, prediabetes.  She has an allergy to buprenorphine, morphine, ACE inhibitors and doxycycline.  I reviewed Dr. Blanca Friend notes that were provided which had no history absolutely of any symptoms in them.  She is a current every day smoker.  Here with her husband who also provides much information. June 23rd she had her second covid shot and around 6-7pm that night her face became numb like sh ehad a shot of novacaine, the back of her head, then all over her body. She got up and she had no "maneuverability", started in the head and immediately went all the way to the feet. She went to the emergency roo, she was vomiting. They kept jer overnight, the next morning could walk and they let her go home  and she wasn't throwing up anymore, she still had symptoms afterwards but 5 days later had an acute episode again. She was in triage for 6 hours and by then she could walk again. The symptoms have not resolved, continuously worse, hands and deet are on fire, she can;t open jars. Her legs hurt at bedtime, she has lightning and shppting pain in the legs and the hands. The whole body is affected, husband says she will wake up crying.Marland Kitchen Her bowel movements are bad. She is also drinking daily, she is due for an abdominal ultrasound this month, she has a long history of alcohol use a 1/2 gallon every 2 days, but not as much lately she drinks beer and white claw. The pain is getting worse, She was given gabapentin for the nerve pain but it knocks her out.   Reviewed notes, labs and imaging from outside physicians, which showed:  MRI cervical 12/2013-spine personally reviewed images and agree:  IMPRESSION: 1. Rightward disc herniations at C5-C6 and C6-C7, more pronounced at the former. Mild C5-C6 spinal stenosis and moderate right C6 foraminal stenosis. Mild right C7 foraminal stenosis. 2. Moderate to severe left facet degeneration at C3-C4. Associated moderate left C4 foraminal stenosis.    REVIEW OF SYSTEMS: Out of a complete 14 system review of symptoms, the patient complains only of the following symptoms, and all other reviewed systems are negative.  See hpi  ALLERGIES: Allergies  Allergen Reactions  . Buprenorphine Hcl Nausea And Vomiting  . Doxycycline Nausea Only  . Morphine Nausea  And Vomiting  . Morphine And Related Nausea And Vomiting  . Ace Inhibitors Cough    HOME MEDICATIONS: Outpatient Medications Prior to Visit  Medication Sig Dispense Refill  . acetaminophen-codeine (TYLENOL #3) 300-30 MG tablet Take 1 tablet by mouth every 8 (eight) hours as needed for moderate pain.     Marland Kitchen amLODipine (NORVASC) 5 MG tablet TAKE 1 TABLET BY MOUTH EVERY DAY 90 tablet 3  .  amphetamine-dextroamphetamine (ADDERALL) 15 MG tablet Take 0.5 tablets by mouth 2 (two) times daily. (Patient taking differently: Take 15 mg by mouth 2 (two) times daily.) 60 tablet 0  . aspirin EC 81 MG tablet Take 81 mg by mouth daily.    . folic acid (FOLVITE) 1 MG tablet Take 1 tablet (1 mg total) by mouth daily.    Marland Kitchen gabapentin (NEURONTIN) 400 MG capsule Take 400 mg by mouth at bedtime.    . metoprolol tartrate (LOPRESSOR) 25 MG tablet Take 1 tablet (25 mg total) by mouth 2 (two) times daily. 60 tablet 2  . Multiple Vitamin (MULTIVITAMIN WITH MINERALS) TABS tablet Take 1 tablet by mouth daily. 30 tablet 0  . nicotine (NICODERM CQ - DOSED IN MG/24 HOURS) 21 mg/24hr patch Place 1 patch (21 mg total) onto the skin daily. 28 patch 0  . nitroGLYCERIN (NITROSTAT) 0.4 MG SL tablet Place 1 tablet (0.4 mg total) under the tongue every 5 (five) minutes as needed for chest pain. 25 tablet 3  . omeprazole (PRILOSEC) 20 MG capsule Take 20 mg by mouth 2 (two) times daily before a meal.    . ondansetron (ZOFRAN) 4 MG tablet Take 1 tablet (4 mg total) by mouth every 8 (eight) hours as needed for nausea or vomiting. 12 tablet 0  . pilocarpine (SALAGEN) 5 MG tablet Take 5 mg by mouth 3 (three) times daily.     . promethazine (PHENERGAN) 25 MG tablet Take 1 tablet (25 mg total) by mouth every 6 (six) hours as needed for nausea or vomiting. 30 tablet 0  . pyridOXINE (B-6) 100 MG tablet Take 1 tablet (100 mg total) by mouth daily.    Marland Kitchen rOPINIRole (REQUIP) 0.5 MG tablet Take 1 tablet (0.5 mg total) by mouth at bedtime. 30 tablet 0  . thiamine 100 MG tablet Take 1 tablet (100 mg total) by mouth daily.    . valACYclovir (VALTREX) 500 MG tablet Take 500 mg by mouth 3 (three) times daily as needed (fever blister).    Marland Kitchen zolpidem (AMBIEN) 5 MG tablet Take 5 mg by mouth at bedtime.     Marland Kitchen ezetimibe (ZETIA) 10 MG tablet Take 1 tablet (10 mg total) by mouth daily. 90 tablet 3   No facility-administered medications prior to  visit.    PAST MEDICAL HISTORY: Past Medical History:  Diagnosis Date  . Acute sensory neuropathy 09/18/2019  . ADD (attention deficit disorder)   . Allergy   . Anemia   . Anxiety   . Coronary artery disease    a. s/p NSTEMI in 2015 with angioplasty alone to mid-LAD, repeat cath within the same month showing a patent site  . Depression   . Depression   . Fatty liver   . GAD (generalized anxiety disorder)   . GERD (gastroesophageal reflux disease)   . Gestational diabetes   . History of heart attack   . Hyperlipidemia   . Hypertension   . Insomnia   . Lupus (HCC)   . Milia   . Myocardial infarction (HCC) 06/2013  .  Rectal abnormality   . Sicca syndrome (HCC)   . Sjogren's syndrome (HCC)   . Telangiectasia   . Vitamin D deficiency     PAST SURGICAL HISTORY: Past Surgical History:  Procedure Laterality Date  . ABDOMINAL HYSTERECTOMY    . CHOLECYSTECTOMY  08/2018  . COLON SURGERY     Flexible Sigmoidoscopy  . COLONOSCOPY N/A 01/08/2014   Procedure: COLONOSCOPY;  Surgeon: Theda Belfast, MD;  Location: [facility];  Service: Endoscopy;  Laterality: N/A;  . ESOPHAGOGASTRODUODENOSCOPY N/A 01/08/2014   Procedure: ESOPHAGOGASTRODUODENOSCOPY (EGD);  Surgeon: Theda Belfast, MD;  Location: [facility];  Service: Endoscopy;  Laterality: N/A;  . LEFT HEART CATHETERIZATION WITH CORONARY ANGIOGRAM N/A 06/24/2013   Procedure: LEFT HEART CATHETERIZATION WITH CORONARY ANGIOGRAM;  Surgeon: [name], MD;  Location: [facility] CATH LAB;  Service: Cardiovascular;  Laterality: N/A;  . LEFT HEART CATHETERIZATION WITH CORONARY ANGIOGRAM N/A 07/17/2013   Procedure: LEFT HEART CATHETERIZATION WITH CORONARY ANGIOGRAM;  Surgeon: Kathleene Hazel, MD;  Location: [facility] CATH LAB;  Service: Cardiovascular;  Laterality: N/A;  . TONSILECTOMY, ADENOIDECTOMY, BILATERAL MYRINGOTOMY AND TUBES    . WRIST SURGERY  1987   RIGHT; bone from forearm grafted to wrist    FAMILY HISTORY: Family History  Problem  Relation Age of Onset  . Cancer Mother   . COPD Mother        emphysema  . Heart disease Father 83       s/p 3V CABG  . Depression Father   . Barrett's esophagus Father   . Mental illness Sister        depression  . Depression Sister   . Mental illness Son        ADHD, bipolar disorder  . Diabetes Maternal Grandmother   . Heart disease Maternal Grandmother   . Diabetes Maternal Grandfather   . Cancer Maternal Grandfather        lung cancer  . Diabetes Paternal Grandmother   . Kidney disease Paternal Grandmother   . Diabetes Paternal Grandfather   . Heart disease Paternal Grandfather     SOCIAL HISTORY: Social History   Socioeconomic History  . Marital status: Married    Spouse name: [name]  . Number of children: 3  . Years of education: 32  . Highest education level: Not on file  Occupational History  . Occupation: Nutritional therapist: [employer]  Tobacco Use  . Smoking status: Current Every Day Smoker    Packs/day: 1.50    Years: 20.00    Pack years: 30.00    Types: Cigarettes    Start date: 12/25/1982  . Smokeless tobacco: Never Used  . Tobacco comment: 09/03/19 < 1.5 ppd  Vaping Use  . Vaping Use: Never used  Substance and Sexual Activity  . Alcohol use: Yes    Alcohol/week: 24.0 standard drinks    Types: 24 Cans of beer per week  . Drug use: No  . Sexual activity: Yes    Partners: Male    Birth control/protection: Surgical  Other Topics Concern  . Not on file  Social History Narrative   Lives with her husband, their son [name], and her daughter Hospital doctor and Amber's son [name].   Social Determinants of Health   Financial Resource Strain: Not on file  Food Insecurity: Not on file  Transportation Needs: Not on file  Physical Activity: Not on file  Stress: Not on file  Social Connections: Not on file  Intimate Partner Violence: Not  on file      PHYSICAL EXAM  Vitals:   04/28/20 1326  BP: 113/66  Pulse: 78  Weight: 113 lb (51.3  kg)  Height: 5\' 3"  (1.6 m)   Body mass index is 20.02 kg/m.  Generalized: Well developed, in no acute distress   Neurological examination  Mentation: Alert oriented to time, place, history taking. Follows all commands speech and language fluent Cranial nerve II-XII: Pupils were equal round reactive to light. Extraocular movements were full, visual field were full on confrontational test. Head turning and shoulder shrug  were normal and symmetric. Motor: The motor testing reveals 5 over 5 strength of all 4 extremities. Good symmetric motor tone is noted throughout.  Sensory: Sensory testing is intact to soft touch on all 4 extremities. No evidence of extinction is noted.  Coordination: Cerebellar testing reveals good finger-nose-finger and heel-to-shin bilaterally.  Gait and station: Gait is normal.  Has a boot on the left foot Reflexes: Deep tendon reflexes are symmetric and normal bilaterally.   DIAGNOSTIC DATA (LABS, IMAGING, TESTING) - I reviewed patient records, labs, notes, testing and imaging myself where available.  Lab Results  Component Value Date   WBC 5.1 11/19/2019   HGB 8.6 (L) 11/19/2019   HCT 25.8 (L) 11/19/2019   MCV 109.3 (H) 11/19/2019   PLT 177 11/19/2019      Component Value Date/Time   NA 137 11/19/2019 1211   K 4.2 11/19/2019 1211   CL 106 11/19/2019 1211   CO2 21 (L) 11/19/2019 1211   GLUCOSE 109 (H) 11/19/2019 1211   BUN 10 11/19/2019 1211   CREATININE 1.03 (H) 11/19/2019 1211   CREATININE 0.77 07/29/2013 0847   CALCIUM 8.6 (L) 11/19/2019 1211   PROT 6.9 11/18/2019 0410   PROT 6.3 09/03/2019 1451   ALBUMIN 3.0 (L) 11/18/2019 0410   AST 48 (H) 11/18/2019 0410   ALT 26 11/18/2019 0410   ALKPHOS 93 11/18/2019 0410   BILITOT 0.6 11/18/2019 0410   GFRNONAA >60 11/19/2019 1211   GFRAA >60 11/19/2019 1211   Lab Results  Component Value Date   CHOL 194 09/18/2019   HDL 32 (L) 09/18/2019   LDLCALC 121 (H) 09/18/2019   TRIG 206 (H) 09/18/2019    CHOLHDL 6.1 09/18/2019   Lab Results  Component Value Date   HGBA1C 5.3 09/03/2019   Lab Results  Component Value Date   VITAMINB12 459 11/19/2019   Lab Results  Component Value Date   TSH 1.710 09/03/2019      ASSESSMENT AND PLAN 55 y.o. year old female  has a past medical history of Acute sensory neuropathy (09/18/2019), ADD (attention deficit disorder), Allergy, Anemia, Anxiety, Coronary artery disease, Depression, Depression, Fatty liver, GAD (generalized anxiety disorder), GERD (gastroesophageal reflux disease), Gestational diabetes, History of heart attack, Hyperlipidemia, Hypertension, Insomnia, Lupus (HCC), Milia, Myocardial infarction (HCC) (06/2013), Rectal abnormality, Sicca syndrome (HCC), Sjogren's syndrome (HCC), Telangiectasia, and Vitamin D deficiency. here with:  1.  Inflammatory neuropathy  --Continue IVIG --Continue gabapentin 40 mg 3 times a day --Advised if symptoms worsen or she develops new symptoms she should let 04-29-1982 know --Follow-up in 6 months   I spent 30 minutes of face-to-face and non-face-to-face time with patient.  This included previsit chart review, lab review, study review, order entry, electronic health record documentation, patient education.  [place], MSN, NP-C 04/28/2020, 1:46 PM [facility] [address] [place], [place] [phone]

## 2020-04-28 NOTE — Patient Instructions (Signed)
Your Plan:  Continue gabapentin 400 mg three times a day If your symptoms worsen or you develop new symptoms please let us know.       Thank you for coming to see us at [facility]. I hope we have been able to provide you high quality care today.  You may receive a patient satisfaction survey over the next few weeks. We would appreciate your feedback and comments so that we may continue to improve ourselves and the health of our patients.  

## 2020-05-07 ENCOUNTER — Other Ambulatory Visit: Payer: Self-pay | Admitting: Neurology

## 2020-05-09 ENCOUNTER — Other Ambulatory Visit: Payer: Self-pay | Admitting: Neurology

## 2020-05-30 ENCOUNTER — Telehealth: Payer: Self-pay | Admitting: Physician Assistant

## 2020-05-30 NOTE — Telephone Encounter (Signed)
Received a new hem referral from Dr. Kristen Loader for IDA. Ms. Hotard has been cld and scheduled to see Karena Addison on 4/12 at 2pm. Pt aware to arrive 20 minutes early.

## 2020-05-31 ENCOUNTER — Inpatient Hospital Stay: Payer: 59

## 2020-05-31 ENCOUNTER — Encounter: Payer: Self-pay | Admitting: Physician Assistant

## 2020-05-31 ENCOUNTER — Inpatient Hospital Stay: Payer: 59 | Attending: Physician Assistant | Admitting: Physician Assistant

## 2020-05-31 ENCOUNTER — Ambulatory Visit: Payer: 59 | Admitting: Cardiovascular Disease

## 2020-05-31 ENCOUNTER — Other Ambulatory Visit: Payer: Self-pay

## 2020-05-31 VITALS — BP 123/70 | HR 85 | Temp 97.4°F | Resp 18 | Wt 114.6 lb

## 2020-05-31 DIAGNOSIS — F339 Major depressive disorder, recurrent, unspecified: Secondary | ICD-10-CM | POA: Insufficient documentation

## 2020-05-31 DIAGNOSIS — Z7982 Long term (current) use of aspirin: Secondary | ICD-10-CM | POA: Insufficient documentation

## 2020-05-31 DIAGNOSIS — R110 Nausea: Secondary | ICD-10-CM | POA: Diagnosis not present

## 2020-05-31 DIAGNOSIS — E559 Vitamin D deficiency, unspecified: Secondary | ICD-10-CM | POA: Diagnosis not present

## 2020-05-31 DIAGNOSIS — E785 Hyperlipidemia, unspecified: Secondary | ICD-10-CM | POA: Insufficient documentation

## 2020-05-31 DIAGNOSIS — M3500 Sicca syndrome, unspecified: Secondary | ICD-10-CM | POA: Diagnosis not present

## 2020-05-31 DIAGNOSIS — G629 Polyneuropathy, unspecified: Secondary | ICD-10-CM | POA: Diagnosis not present

## 2020-05-31 DIAGNOSIS — F411 Generalized anxiety disorder: Secondary | ICD-10-CM | POA: Diagnosis not present

## 2020-05-31 DIAGNOSIS — K219 Gastro-esophageal reflux disease without esophagitis: Secondary | ICD-10-CM | POA: Diagnosis not present

## 2020-05-31 DIAGNOSIS — F17210 Nicotine dependence, cigarettes, uncomplicated: Secondary | ICD-10-CM | POA: Insufficient documentation

## 2020-05-31 DIAGNOSIS — D509 Iron deficiency anemia, unspecified: Secondary | ICD-10-CM

## 2020-05-31 DIAGNOSIS — K760 Fatty (change of) liver, not elsewhere classified: Secondary | ICD-10-CM | POA: Diagnosis not present

## 2020-05-31 DIAGNOSIS — I2510 Atherosclerotic heart disease of native coronary artery without angina pectoris: Secondary | ICD-10-CM | POA: Insufficient documentation

## 2020-05-31 DIAGNOSIS — I10 Essential (primary) hypertension: Secondary | ICD-10-CM | POA: Diagnosis not present

## 2020-05-31 DIAGNOSIS — M329 Systemic lupus erythematosus, unspecified: Secondary | ICD-10-CM | POA: Insufficient documentation

## 2020-05-31 DIAGNOSIS — Z79899 Other long term (current) drug therapy: Secondary | ICD-10-CM | POA: Diagnosis not present

## 2020-05-31 LAB — CBC WITH DIFFERENTIAL (CANCER CENTER ONLY)
Abs Immature Granulocytes: 0 10*3/uL (ref 0.00–0.07)
Basophils Absolute: 0.1 10*3/uL (ref 0.0–0.1)
Basophils Relative: 1 %
Eosinophils Absolute: 0.1 10*3/uL (ref 0.0–0.5)
Eosinophils Relative: 1 %
HCT: 32.4 % — ABNORMAL LOW (ref 36.0–46.0)
Hemoglobin: 10.2 g/dL — ABNORMAL LOW (ref 12.0–15.0)
Immature Granulocytes: 0 %
Lymphocytes Relative: 46 %
Lymphs Abs: 2.5 10*3/uL (ref 0.7–4.0)
MCH: 25.5 pg — ABNORMAL LOW (ref 26.0–34.0)
MCHC: 31.5 g/dL (ref 30.0–36.0)
MCV: 81 fL (ref 80.0–100.0)
Monocytes Absolute: 0.4 10*3/uL (ref 0.1–1.0)
Monocytes Relative: 7 %
Neutro Abs: 2.4 10*3/uL (ref 1.7–7.7)
Neutrophils Relative %: 45 %
Platelet Count: 233 10*3/uL (ref 150–400)
RBC: 4 MIL/uL (ref 3.87–5.11)
RDW: 16.1 % — ABNORMAL HIGH (ref 11.5–15.5)
WBC Count: 5.4 10*3/uL (ref 4.0–10.5)
nRBC: 0 % (ref 0.0–0.2)

## 2020-05-31 LAB — CMP (CANCER CENTER ONLY)
ALT: 13 U/L (ref 0–44)
AST: 20 U/L (ref 15–41)
Albumin: 3.8 g/dL (ref 3.5–5.0)
Alkaline Phosphatase: 87 U/L (ref 38–126)
Anion gap: 10 (ref 5–15)
BUN: 11 mg/dL (ref 6–20)
CO2: 26 mmol/L (ref 22–32)
Calcium: 9.5 mg/dL (ref 8.9–10.3)
Chloride: 103 mmol/L (ref 98–111)
Creatinine: 0.79 mg/dL (ref 0.44–1.00)
GFR, Estimated: >60 mL/min (ref >60)
Glucose, Bld: 127 mg/dL — ABNORMAL HIGH (ref 70–99)
Potassium: 3.9 mmol/L (ref 3.5–5.1)
Sodium: 139 mmol/L (ref 135–145)
Total Bilirubin: 0.3 mg/dL (ref 0.3–1.2)
Total Protein: 7.7 g/dL (ref 6.5–8.1)

## 2020-05-31 LAB — RETIC PANEL
Immature Retic Fract: 16.9 % — ABNORMAL HIGH (ref 2.3–15.9)
RBC.: 3.94 MIL/uL (ref 3.87–5.11)
Retic Count, Absolute: 79.2 10*3/uL (ref 19.0–186.0)
Retic Ct Pct: 2 % (ref 0.4–3.1)
Reticulocyte Hemoglobin: 25.9 pg — ABNORMAL LOW (ref >27.9)

## 2020-05-31 NOTE — Progress Notes (Signed)
[facility] [phone]   [phone]  INITIAL CONSULT NOTE  Patient Care Team: Laqueta DueFurr, Lynn M., MD as PCP - General (Internal Medicine) Lennette BihariKelly, Lynn A, MD as PCP - Cardiology (Cardiology) Sherian ReinBovard-Stuckert, Jody, MD as Consulting Physician (Obstetrics and Gynecology) Lennette BihariKelly, Lynn A, MD as Consulting Physician (Cardiology) Jeani HawkingHung, Patrick, MD as Consulting Physician (Gastroenterology) Scarlett Prestoobbins, Lynn J, Harvey as Physician Assistant (Neurology)  Hematological/Oncological History 1) 12/17/2013: Presented to ED for chest pain and rectal bleeding. Hemoglobin dropped from a baseline of 12 to 9.5. Positive hemoccult.   2)01/08/2014: EGD and Colonoscopy performed by Dr. Jeani HawkingPatrick Harvey for iron deficiency anemia. Unremarkable. Recommended capsular endoscopy. Patient received IV iron transfusions since she could not tolerate oral iron.   3) 08/28/2016: Hgb 8.1 (L), Iron 16 (L), Ferritin 14(L), TIBC 454, Iron saturation 4% (L), Transferrin 337. Received 2 doses of IV iron.   4)10/12/2016: Iron 89, TIBC 310, Ferritin 523 (H)  5) 10/19/2016: EGD/Colonoscopy: Hyperplastic colon polyp and grade 3 internal hemorrhoids. SB biopsies negative for sprue. 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 No evidence of varices, esophagitis, stricture or Barrett's.   7) 11/19/2019: Iron 70, TIBC 335, Iron saturation 21%, Ferritin 267  8) 05/18/2020: Transferrin 390 (H), Ferritin 8 (L), TIBC 546 (H), Iron saturation 11% (L)  9) 05/31/2020: Established care with Lynn Harvey   CHIEF COMPLAINTS/PURPOSE OF CONSULTATION:  "Iron deficiency anemia"  HISTORY OF PRESENTING ILLNESS:  Lynn Harvey 55 y.o. Harvey with medical history significant for hypertension, bilateral carotid artery disease,  SICCA syndrome, GERD, fatty liver, hyperlipidemia, recurrent major depression, and  generalized anxiety disorder.  On review of the previous records, Lynn Harvey has history of iron deficiency anemia without clear underlying cause. Most recent EGD was from July 2020 that was unremarkable except for angiectasias in the antrum. No source of bleeding was identified. It was recommended for patient to undergo capsular endoscopy which has not been done.   On exam today, Lynn Harvey reports persistent fatigue and admits to oversleeping several times. She continues to work full time and is able to complete her ADLs on her own. She denies any changes to her appetite or weight. She reports intermittent episodes of nausea that is controlled with zofran as needed. She denies any vomiting episodes or abdominal pain. Her bowel movements are regular. She denies any easy bruising or signs of bleeding including hematochezia, melena, hematuria, epistaxis, hemoptysis or gum bleeding. Patient has neuropathy in her hands and feet after receiving the second Pfizer COVID vaccine in June 2021. She is under the care of neurology and receiving IVIG along with gabapentin 40 mg TID. She denies any fevers, chills, shortness of breath, chest pain or cough. She has no other complaints. Rest of the 10 point ROS is below.   MEDICAL HISTORY:  Past Medical History:  Diagnosis Date  . Acute sensory neuropathy 09/18/2019  . ADD (attention deficit disorder)   . Allergy   . Anemia   . Anxiety   . Coronary artery disease    a. s/p NSTEMI in 2015 with angioplasty alone to mid-LAD, repeat cath within the same month showing a patent site  . Depression   . Depression   . Fatty liver   . GAD (generalized anxiety disorder)   . GERD (gastroesophageal reflux disease)   . Gestational diabetes   . History of heart attack   . Hyperlipidemia   .  Hypertension   . Insomnia   . Lupus (HCC)   . Milia   . Myocardial infarction (HCC) 06/2013  . Rectal abnormality   . Sicca syndrome (HCC)   . Sjogren's syndrome (HCC)   .  Telangiectasia   . Vitamin D deficiency     SURGICAL HISTORY: Past Surgical History:  Procedure Laterality Date  . ABDOMINAL HYSTERECTOMY     fibroids, endometrosis.   . CHOLECYSTECTOMY  08/2018  . COLON SURGERY     Flexible Sigmoidoscopy  . COLONOSCOPY N/A 01/08/2014   Procedure: COLONOSCOPY;  Surgeon: Theda Belfast, MD;  Location: [facility]OS[facility];  Service: Endoscopy;  Laterality: N/A;  . ESOPHAGOGASTRODUODENOSCOPY N/A 01/08/2014   Procedure: ESOPHAGOGASTRODUODENOSCOPY (EGD);  Surgeon: Theda Belfast, MD;  Location: [facility];  Service: Endoscopy;  Laterality: N/A;  . LEFT HEART CATHETERIZATION WITH CORONARY ANGIOGRAM N/A 06/24/2013   Procedure: LEFT HEART CATHETERIZATION WITH CORONARY ANGIOGRAM;  Surgeon: [name], MD;  Location: [facility] CATH LAB;  Service: Cardiovascular;  Laterality: N/A;  . LEFT HEART CATHETERIZATION WITH CORONARY ANGIOGRAM N/A 07/17/2013   Procedure: LEFT HEART CATHETERIZATION WITH CORONARY ANGIOGRAM;  Surgeon: Kathleene Hazel, MD;  Location: [facility] CATH LAB;  Service: Cardiovascular;  Laterality: N/A;  . TONSILECTOMY, ADENOIDECTOMY, BILATERAL MYRINGOTOMY AND TUBES    . WRIST SURGERY  1987   RIGHT; bone from forearm grafted to wrist    SOCIAL HISTORY: Social History   Socioeconomic History  . Marital status: Married    Spouse name: Lynn Harvey  . Number of children: 3  . Years of education: 38  . Highest education level: Not on file  Occupational History  . Occupation: Nutritional therapist: [employer]  Tobacco Use  . Smoking status: Current Every Day Smoker    Packs/day: 1.00    Years: 20.00    Pack years: 20.00    Types: Cigarettes    Start date: 12/25/1982  . Smokeless tobacco: Never Used  . Tobacco comment: 09/03/19 < 1.5 ppd  Vaping Use  . Vaping Use: Never used  Substance and Sexual Activity  . Alcohol use: Not Currently    Comment: Drank 24 cans/week until September 2021  . Drug use: No  . Sexual activity: Yes     Partners: Male    Birth control/protection: Surgical  Other Topics Concern  . Not on file  Social History Narrative   Lives with her husband, their son [name], and her daughter Hospital doctor and Amber's son [name].   Social Determinants of Health   Financial Resource Strain: Not on file  Food Insecurity: Not on file  Transportation Needs: Not on file  Physical Activity: Not on file  Stress: Not on file  Social Connections: Not on file  Intimate Partner Violence: Not on file    FAMILY HISTORY: Family History  Problem Relation Age of Onset  . COPD Mother        emphysema  . Lung cancer Mother   . Heart disease Father 60       s/p 3V CABG  . Depression Father   . Barrett's esophagus Father   . Mental illness Sister        depression  . Depression Sister   . Mental illness Son        ADHD, bipolar disorder  . Diabetes Maternal Grandmother   . Heart disease Maternal Grandmother   . Diabetes Maternal Grandfather   . Cancer Maternal Grandfather        lung cancer  .  Diabetes Paternal Grandmother   . Kidney disease Paternal Grandmother   . Diabetes Paternal Grandfather   . Heart disease Paternal Grandfather   . Lung cancer Maternal Aunt     ALLERGIES:  is allergic to buprenorphine hcl, doxycycline, morphine, morphine and related, and ace inhibitors.  MEDICATIONS:  Current Outpatient Medications  Medication Sig Dispense Refill  . acetaminophen-codeine (TYLENOL #3) 300-30 MG tablet Take 1 tablet by mouth every 8 (eight) hours as needed for moderate pain.     Marland Kitchen amLODipine (NORVASC) 5 MG tablet TAKE 1 TABLET BY MOUTH EVERY DAY 90 tablet 3  . amphetamine-dextroamphetamine (ADDERALL) 15 MG tablet Take 0.5 tablets by mouth 2 (two) times daily. (Patient taking differently: Take 15 mg by mouth 2 (two) times daily.) 60 tablet 0  . aspirin EC 81 MG tablet Take 81 mg by mouth daily.    Marland Kitchen ezetimibe (ZETIA) 10 MG tablet Take 1 tablet (10 mg total) by mouth daily. 90 tablet 3  . folic acid  (FOLVITE) 1 MG tablet Take 1 tablet (1 mg total) by mouth daily.    Marland Kitchen gabapentin (NEURONTIN) 400 MG capsule TAKE 1 CAPSULE(400 MG) BY MOUTH THREE TIMES DAILY 90 capsule 3  . metoprolol tartrate (LOPRESSOR) 25 MG tablet Take 1 tablet (25 mg total) by mouth 2 (two) times daily. 60 tablet 2  . Multiple Vitamin (MULTIVITAMIN WITH MINERALS) TABS tablet Take 1 tablet by mouth daily. 30 tablet 0  . nicotine (NICODERM CQ - DOSED IN MG/24 HOURS) 21 mg/24hr patch Place 1 patch (21 mg total) onto the skin daily. 28 patch 0  . nitroGLYCERIN (NITROSTAT) 0.4 MG SL tablet Place 1 tablet (0.4 mg total) under the tongue every 5 (five) minutes as needed for chest pain. 25 tablet 3  . omeprazole (PRILOSEC) 20 MG capsule Take 20 mg by mouth 2 (two) times daily before a meal.    . ondansetron (ZOFRAN) 4 MG tablet Take 1 tablet (4 mg total) by mouth every 8 (eight) hours as needed for nausea or vomiting. 12 tablet 0  . pilocarpine (SALAGEN) 5 MG tablet Take 5 mg by mouth 3 (three) times daily.     . promethazine (PHENERGAN) 25 MG tablet Take 1 tablet (25 mg total) by mouth every 6 (six) hours as needed for nausea or vomiting. 30 tablet 0  . pyridOXINE (B-6) 100 MG tablet Take 1 tablet (100 mg total) by mouth daily.    Marland Kitchen rOPINIRole (REQUIP) 0.5 MG tablet Take 1 tablet (0.5 mg total) by mouth at bedtime. 30 tablet 0  . thiamine 100 MG tablet Take 1 tablet (100 mg total) by mouth daily.    . valACYclovir (VALTREX) 500 MG tablet Take 500 mg by mouth 3 (three) times daily as needed (fever blister).    Marland Kitchen zolpidem (AMBIEN) 5 MG tablet Take 5 mg by mouth at bedtime.      No current facility-administered medications for this visit.    REVIEW OF SYSTEMS:   Constitutional: ( - ) fevers, ( - )  chills , ( - ) night sweats Eyes: ( - ) blurriness of vision, ( - ) double vision, ( - ) watery eyes Ears, nose, mouth, throat, and face: ( - ) mucositis, ( - ) sore throat Respiratory: ( - ) cough, ( - ) dyspnea, ( - )  wheezes Cardiovascular: ( - ) palpitation, ( - ) chest discomfort, ( - ) lower extremity swelling Gastrointestinal:  ( + ) nausea, ( - ) heartburn, ( - )  change in bowel habits Skin: ( - ) abnormal skin rashes Lymphatics: ( - ) new lymphadenopathy, ( - ) easy bruising Neurological: ( +) numbness, ( +) tingling, ( - ) new weaknesses Behavioral/Psych: ( - ) mood change, ( - ) new changes  All other systems were reviewed with the patient and are negative.  PHYSICAL EXAMINATION: ECOG PERFORMANCE STATUS: 1 - Symptomatic but completely ambulatory  Vitals:   05/31/20 1432  BP: 123/70  Pulse: 85  Resp: 18  Temp: (!) 97.4 F (36.3 C)  SpO2: 98%   Filed Weights   05/31/20 1432  Weight: 114 lb 9.6 oz (52 kg)    GENERAL: well appearing Harvey in NAD  SKIN: skin color, texture, turgor are normal, no rashes or significant lesions EYES: conjunctiva are pink and non-injected, sclera clear OROPHARYNX: no exudate, no erythema; lips, buccal mucosa, and tongue normal  NECK: supple, non-tender LYMPH:  no palpable lymphadenopathy in the cervical, axillary or supraclavicular lymph nodes.  LUNGS: clear to auscultation and percussion with normal breathing effort HEART: regular rate & rhythm and no murmurs and no lower extremity edema ABDOMEN: soft, non-tender, non-distended, normal bowel sounds Musculoskeletal: no cyanosis of digits and no clubbing  PSYCH: alert & oriented x 3, fluent speech NEURO: no focal motor/sensory deficits  LABORATORY DATA:  I have reviewed the data as listed CBC Latest Ref Rng & Units 05/31/2020 11/19/2019 11/18/2019  WBC 4.0 - 10.5 K/uL 5.4 5.1 6.9  Hemoglobin 12.0 - 15.0 g/dL 10.2(L) 8.6(L) 9.4(L)  Hematocrit 36.0 - 46.0 % 32.4(L) 25.8(L) 27.7(L)  Platelets 150 - 400 K/uL 233 177 232    CMP Latest Ref Rng & Units 05/31/2020 11/19/2019 11/18/2019  Glucose 70 - 99 mg/dL 735(H) 299(M) 426(S)  BUN 6 - 20 mg/dL 11 10 34(H)  Creatinine 0.44 - 1.00 mg/dL 9.62 2.29(N) 9.89(Q)   Sodium 135 - 145 mmol/L 139 137 136  Potassium 3.5 - 5.1 mmol/L 3.9 4.2 3.9  Chloride 98 - 111 mmol/L 103 106 103  CO2 22 - 32 mmol/L 26 21(L) 24  Calcium 8.9 - 10.3 mg/dL 9.5 1.1(H) 4.1(D)  Total Protein 6.5 - 8.1 g/dL 7.7 - 6.9  Total Bilirubin 0.3 - 1.2 mg/dL 0.3 - 0.6  Alkaline Phos 38 - 126 U/L 87 - 93  AST 15 - 41 U/L 20 - 48(H)  ALT 0 - 44 U/L 13 - 26   ASSESSMENT & PLAN Lynn Harvey is a 55 y.o. Harvey presenting to the clinic for evaluation for iron deficiency anemia. Reviewed most recent outside labs from 05/18/2020 that revaled iron deficiency anemia. Recommend repeat labs today to check CBC, CMP, iron and TIBC, ferritin and retic panel. Uncertain cause of iron deficiency anemia as patiet denies any signs of bleeding. Patient has yet to undergo capsular endoscopy that was recommended by Dr. Devona Konig in [facility]. I recommend to follow up with GI to further evaluate. Patient will proceed with IV venofer x 5 doses   #Iron deficiency anemia: --Uncertain etiology. Patient denies any signs of bruising or bleeding.  --Recommend follow up with GI to proceed with capsular endoscopy --Labs today to check  CBC, CMP, iron and TIBC, ferritin and retic panel.  --Recommend IV venofer x 5 doses.  --RTC 4-6 weeks after IV iron infusions with labs.   Orders Placed This Encounter  Procedures  . CBC with Differential (Cancer Center Only)    Standing Status:   Future    Number of Occurrences:  1    Standing Expiration Date:   05/31/2021  . Ferritin    Standing Status:   Future    Number of Occurrences:   1    Standing Expiration Date:   05/31/2021  . Iron and TIBC    Standing Status:   Future    Number of Occurrences:   1    Standing Expiration Date:   05/31/2021  . CMP (Cancer Center only)    Standing Status:   Future    Number of Occurrences:   1    Standing Expiration Date:   05/31/2021  . Retic Panel    Standing Status:   Future    Number of Occurrences:   1     Standing Expiration Date:   05/31/2021    All questions were answered. The patient knows to call the clinic with any problems, questions or concerns.  A total of more than 55 minutes were spent on this encounter and over half of that time was spent on counseling and coordination of care as outlined above.    Lynn Kaufmann, Harvey Department of Hematology/Oncology [facility] Phone: [phone]

## 2020-06-01 LAB — IRON AND TIBC
Iron: 32 ug/dL — ABNORMAL LOW (ref 41–142)
Saturation Ratios: 6 % — ABNORMAL LOW (ref 21–57)
TIBC: 523 ug/dL — ABNORMAL HIGH (ref 236–444)
UIBC: 491 ug/dL — ABNORMAL HIGH (ref 120–384)

## 2020-06-01 LAB — FERRITIN: Ferritin: 8 ng/mL — ABNORMAL LOW (ref 11–307)

## 2020-06-10 ENCOUNTER — Inpatient Hospital Stay: Payer: 59

## 2020-06-10 ENCOUNTER — Other Ambulatory Visit: Payer: Self-pay

## 2020-06-10 VITALS — BP 118/60 | HR 90 | Temp 98.4°F | Resp 17

## 2020-06-10 DIAGNOSIS — D509 Iron deficiency anemia, unspecified: Secondary | ICD-10-CM | POA: Diagnosis not present

## 2020-06-10 MED ORDER — SODIUM CHLORIDE 0.9 % IV SOLN
Freq: Once | INTRAVENOUS | Status: AC
Start: 2020-06-10 — End: 2020-06-10
  Filled 2020-06-10: qty 250

## 2020-06-10 MED ORDER — SODIUM CHLORIDE 0.9 % IV SOLN
200.0000 mg | Freq: Once | INTRAVENOUS | Status: AC
  Administered 2020-06-10: 200 mg via INTRAVENOUS
  Filled 2020-06-10: qty 200

## 2020-06-10 NOTE — Patient Instructions (Signed)

## 2020-06-17 ENCOUNTER — Inpatient Hospital Stay: Payer: 59

## 2020-06-17 ENCOUNTER — Other Ambulatory Visit: Payer: Self-pay

## 2020-06-17 VITALS — BP 121/59 | HR 66 | Temp 98.6°F | Resp 17

## 2020-06-17 DIAGNOSIS — D509 Iron deficiency anemia, unspecified: Secondary | ICD-10-CM | POA: Diagnosis not present

## 2020-06-17 MED ORDER — SODIUM CHLORIDE 0.9 % IV SOLN
Freq: Once | INTRAVENOUS | Status: AC
  Filled 2020-06-17: qty 250

## 2020-06-17 MED ORDER — SODIUM CHLORIDE 0.9 % IV SOLN
200.0000 mg | Freq: Once | INTRAVENOUS | Status: AC
  Administered 2020-06-17: 200 mg via INTRAVENOUS
  Filled 2020-06-17: qty 200

## 2020-06-17 MED ORDER — PEGFILGRASTIM-JMDB 6 MG/0.6ML ~~LOC~~ SOSY
PREFILLED_SYRINGE | SUBCUTANEOUS | Status: AC
  Filled 2020-06-17: qty 0.6

## 2020-06-17 NOTE — Patient Instructions (Signed)

## 2020-06-24 ENCOUNTER — Other Ambulatory Visit: Payer: Self-pay

## 2020-06-24 ENCOUNTER — Inpatient Hospital Stay: Payer: 59 | Attending: Physician Assistant

## 2020-06-24 VITALS — BP 137/76 | HR 66 | Temp 98.6°F | Resp 18

## 2020-06-24 DIAGNOSIS — D509 Iron deficiency anemia, unspecified: Secondary | ICD-10-CM | POA: Insufficient documentation

## 2020-06-24 MED ORDER — SODIUM CHLORIDE 0.9 % IV SOLN
200.0000 mg | Freq: Once | INTRAVENOUS | Status: AC
  Administered 2020-06-24: 200 mg via INTRAVENOUS
  Filled 2020-06-24: qty 200

## 2020-06-24 MED ORDER — SODIUM CHLORIDE 0.9 % IV SOLN
Freq: Once | INTRAVENOUS | Status: AC
Start: 2020-06-24 — End: 2020-06-24
  Filled 2020-06-24: qty 250

## 2020-06-24 NOTE — Progress Notes (Signed)
Patient was observed for 30 minutes post infusion with no adverse reaction. Vitals stable upon departure and patient in no distress.

## 2020-06-24 NOTE — Patient Instructions (Signed)

## 2020-07-01 ENCOUNTER — Inpatient Hospital Stay: Payer: 59

## 2020-07-01 ENCOUNTER — Other Ambulatory Visit: Payer: Self-pay

## 2020-07-01 VITALS — BP 112/61 | HR 75 | Temp 97.8°F | Resp 16

## 2020-07-01 DIAGNOSIS — D509 Iron deficiency anemia, unspecified: Secondary | ICD-10-CM

## 2020-07-01 MED ORDER — SODIUM CHLORIDE 0.9 % IV SOLN
Freq: Once | INTRAVENOUS | Status: AC
  Filled 2020-07-01: qty 250

## 2020-07-01 MED ORDER — SODIUM CHLORIDE 0.9 % IV SOLN
200.0000 mg | Freq: Once | INTRAVENOUS | Status: AC
  Administered 2020-07-01: 200 mg via INTRAVENOUS
  Filled 2020-07-01: qty 200

## 2020-07-01 NOTE — Patient Instructions (Signed)

## 2020-07-08 ENCOUNTER — Inpatient Hospital Stay: Payer: 59

## 2020-07-08 ENCOUNTER — Other Ambulatory Visit: Payer: Self-pay

## 2020-07-08 VITALS — BP 122/68 | HR 72 | Temp 98.2°F | Resp 18

## 2020-07-08 DIAGNOSIS — D509 Iron deficiency anemia, unspecified: Secondary | ICD-10-CM | POA: Diagnosis not present

## 2020-07-08 MED ORDER — SODIUM CHLORIDE 0.9 % IV SOLN
200.0000 mg | Freq: Once | INTRAVENOUS | Status: AC
  Administered 2020-07-08: 200 mg via INTRAVENOUS
  Filled 2020-07-08: qty 200

## 2020-07-08 MED ORDER — SODIUM CHLORIDE 0.9 % IV SOLN
Freq: Once | INTRAVENOUS | Status: AC
Start: 2020-07-08 — End: 2020-07-08
  Filled 2020-07-08: qty 250

## 2020-07-08 NOTE — Progress Notes (Signed)
Patient did not wait 30 minute post observation for her iron infusion. VSS upon discharge.

## 2020-07-08 NOTE — Patient Instructions (Signed)

## 2020-07-29 ENCOUNTER — Other Ambulatory Visit: Payer: Self-pay | Admitting: Cardiovascular Disease

## 2020-08-26 ENCOUNTER — Other Ambulatory Visit: Payer: Self-pay | Admitting: Adult Health

## 2020-08-30 ENCOUNTER — Other Ambulatory Visit: Payer: Self-pay | Admitting: Physician Assistant

## 2020-08-30 DIAGNOSIS — D509 Iron deficiency anemia, unspecified: Secondary | ICD-10-CM

## 2020-08-31 ENCOUNTER — Inpatient Hospital Stay: Payer: 59 | Attending: Physician Assistant | Admitting: Physician Assistant

## 2020-08-31 ENCOUNTER — Other Ambulatory Visit: Payer: Self-pay

## 2020-08-31 ENCOUNTER — Inpatient Hospital Stay: Payer: 59

## 2020-08-31 VITALS — BP 125/74 | HR 73 | Temp 97.7°F | Resp 18 | Ht 63.0 in | Wt 123.5 lb

## 2020-08-31 DIAGNOSIS — M3500 Sicca syndrome, unspecified: Secondary | ICD-10-CM | POA: Insufficient documentation

## 2020-08-31 DIAGNOSIS — Z7982 Long term (current) use of aspirin: Secondary | ICD-10-CM | POA: Insufficient documentation

## 2020-08-31 DIAGNOSIS — M329 Systemic lupus erythematosus, unspecified: Secondary | ICD-10-CM | POA: Insufficient documentation

## 2020-08-31 DIAGNOSIS — D509 Iron deficiency anemia, unspecified: Secondary | ICD-10-CM | POA: Diagnosis not present

## 2020-08-31 DIAGNOSIS — Z79899 Other long term (current) drug therapy: Secondary | ICD-10-CM | POA: Diagnosis not present

## 2020-08-31 DIAGNOSIS — R21 Rash and other nonspecific skin eruption: Secondary | ICD-10-CM | POA: Diagnosis not present

## 2020-08-31 DIAGNOSIS — F411 Generalized anxiety disorder: Secondary | ICD-10-CM | POA: Diagnosis not present

## 2020-08-31 DIAGNOSIS — I2510 Atherosclerotic heart disease of native coronary artery without angina pectoris: Secondary | ICD-10-CM | POA: Diagnosis not present

## 2020-08-31 DIAGNOSIS — I10 Essential (primary) hypertension: Secondary | ICD-10-CM | POA: Diagnosis not present

## 2020-08-31 DIAGNOSIS — K219 Gastro-esophageal reflux disease without esophagitis: Secondary | ICD-10-CM | POA: Insufficient documentation

## 2020-08-31 DIAGNOSIS — I252 Old myocardial infarction: Secondary | ICD-10-CM | POA: Insufficient documentation

## 2020-08-31 DIAGNOSIS — E785 Hyperlipidemia, unspecified: Secondary | ICD-10-CM | POA: Diagnosis not present

## 2020-08-31 DIAGNOSIS — F17210 Nicotine dependence, cigarettes, uncomplicated: Secondary | ICD-10-CM | POA: Diagnosis not present

## 2020-08-31 LAB — CBC WITH DIFFERENTIAL (CANCER CENTER ONLY)
Abs Immature Granulocytes: 0.02 10*3/uL (ref 0.00–0.07)
Basophils Absolute: 0.1 10*3/uL (ref 0.0–0.1)
Basophils Relative: 1 %
Eosinophils Absolute: 0.2 10*3/uL (ref 0.0–0.5)
Eosinophils Relative: 3 %
HCT: 44.9 % (ref 36.0–46.0)
Hemoglobin: 15.5 g/dL — ABNORMAL HIGH (ref 12.0–15.0)
Immature Granulocytes: 0 %
Lymphocytes Relative: 30 %
Lymphs Abs: 2.1 10*3/uL (ref 0.7–4.0)
MCH: 30.8 pg (ref 26.0–34.0)
MCHC: 34.5 g/dL (ref 30.0–36.0)
MCV: 89.1 fL (ref 80.0–100.0)
Monocytes Absolute: 0.5 10*3/uL (ref 0.1–1.0)
Monocytes Relative: 7 %
Neutro Abs: 4.1 10*3/uL (ref 1.7–7.7)
Neutrophils Relative %: 59 %
Platelet Count: 230 10*3/uL (ref 150–400)
RBC: 5.04 MIL/uL (ref 3.87–5.11)
RDW: 15.2 % (ref 11.5–15.5)
WBC Count: 6.9 10*3/uL (ref 4.0–10.5)
nRBC: 0 % (ref 0.0–0.2)

## 2020-08-31 LAB — IRON AND TIBC
Iron: 76 ug/dL (ref 41–142)
Saturation Ratios: 20 % — ABNORMAL LOW (ref 21–57)
TIBC: 374 ug/dL (ref 236–444)
UIBC: 298 ug/dL (ref 120–384)

## 2020-08-31 LAB — CMP (CANCER CENTER ONLY)
ALT: 38 U/L (ref 0–44)
AST: 39 U/L (ref 15–41)
Albumin: 3.7 g/dL (ref 3.5–5.0)
Alkaline Phosphatase: 98 U/L (ref 38–126)
Anion gap: 9 (ref 5–15)
BUN: 12 mg/dL (ref 6–20)
CO2: 28 mmol/L (ref 22–32)
Calcium: 10.3 mg/dL (ref 8.9–10.3)
Chloride: 104 mmol/L (ref 98–111)
Creatinine: 0.87 mg/dL (ref 0.44–1.00)
GFR, Estimated: >60 mL/min (ref >60)
Glucose, Bld: 107 mg/dL — ABNORMAL HIGH (ref 70–99)
Potassium: 4.9 mmol/L (ref 3.5–5.1)
Sodium: 141 mmol/L (ref 135–145)
Total Bilirubin: 0.4 mg/dL (ref 0.3–1.2)
Total Protein: 8 g/dL (ref 6.5–8.1)

## 2020-08-31 LAB — RETIC PANEL
Immature Retic Fract: 12.3 % (ref 2.3–15.9)
RBC.: 4.99 MIL/uL (ref 3.87–5.11)
Retic Count, Absolute: 82.8 10*3/uL (ref 19.0–186.0)
Retic Ct Pct: 1.7 % (ref 0.4–3.1)
Reticulocyte Hemoglobin: 34.6 pg (ref >27.9)

## 2020-08-31 LAB — FERRITIN: Ferritin: 125 ng/mL (ref 11–307)

## 2020-08-31 NOTE — Progress Notes (Signed)
[facility] [phone]   [phone]  INITIAL CONSULT NOTE  Patient Care Team: [name], MD as PCP - General (Internal Medicine) [name], MD as PCP - Cardiology (Cardiology) Sherian Rein, MD as Consulting Physician (Obstetrics and Gynecology) [name], MD as Consulting Physician (Cardiology) Jeani Hawking, MD as Consulting Physician (Gastroenterology) Scarlett Presto as Physician Assistant (Neurology)  Hematological/Oncological History 1) 12/17/2013: Presented to ED for chest pain and rectal bleeding. Hemoglobin dropped from a baseline of 12 to 9.5. Positive hemoccult.   2)01/08/2014: EGD and Colonoscopy performed by Dr. Jeani Hawking for iron deficiency anemia. Unremarkable. Recommended capsular endoscopy. Patient received IV iron transfusions since she could not tolerate oral iron.   3) 08/28/2016: Hgb 8.1 (L), Iron 16 (L), Ferritin 14(L), TIBC 454, Iron saturation 4% (L), Transferrin 337. Received 2 doses of IV iron.   4)10/12/2016: Iron 89, TIBC 310, Ferritin 523 (H)  5) 10/19/2016: EGD/Colonoscopy: Hyperplastic colon polyp and grade 3 internal hemorrhoids. SB biopsies negative for sprue. 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 No evidence of varices, esophagitis, stricture or Barrett's.   7) 11/19/2019: Iron 70, TIBC 335, Iron saturation 21%, Ferritin 267  8) 05/18/2020: Transferrin 390 (H), Ferritin 8 (L), TIBC 546 (H), Iron saturation 11% (L)  9) 05/31/2020: Established care with Georga Kaufmann PA-C  10) 06/10/2020-07/08/2020: Received IV venofer x 5 doses.   11) 08/25/2020: Colonoscopy revealed one benign appearing colon polyp. Enteroscopy revealed subtle appearing gastric AVMs, proximal jejunum is normal. Recommend VCE if Hgb does not improve  CHIEF COMPLAINTS: Iron deficiency anemia  HISTORY OF  PRESENTING ILLNESS:  Lynn Harvey 55 y.o. female who returns for a follow up for iron deficiency anemia.  Patient is unaccompanied for this visit.  She reports that after her first IV Venofer infusion, she experienced body aches and nausea that lasted for one day.  Symptoms resolved on its own.  She did not have any other side effects with subsequent IV Venofer infusions.  She continues to feel fatigued although she completes all her ADLs on her own.  Patient denies any appetite changes or weight changes.  She denies any nausea, vomiting or abdominal pain.  Her bowel movements are regular without any diarrhea or constipation.  She denies easy bruising or signs of bleeding at this time.  She reports having a pruritic rash on both her hands for the last month. She is uncertain the cause and has tried steroid cream with no improvement. She denies any fevers, chills, shortness of breath, chest pain or cough. She has no other complaints. Rest of the 10 point ROS is below.   MEDICAL HISTORY:  Past Medical History:  Diagnosis Date   Acute sensory neuropathy 09/18/2019   ADD (attention deficit disorder)    Allergy    Anemia    Anxiety    Coronary artery disease    a. s/p NSTEMI in 2015 with angioplasty alone to mid-LAD, repeat cath within the same month showing a patent site   Depression    Depression    Fatty liver    GAD (generalized anxiety disorder)    GERD (gastroesophageal reflux disease)    Gestational diabetes    History of heart attack    Hyperlipidemia    Hypertension    Insomnia    Lupus (HCC)    Milia    Myocardial infarction (HCC) 06/2013  Rectal abnormality    Sicca syndrome (HCC)    Sjogren's syndrome (HCC)    Telangiectasia    Vitamin D deficiency     SURGICAL HISTORY: Past Surgical History:  Procedure Laterality Date   ABDOMINAL HYSTERECTOMY     fibroids, endometrosis.    CHOLECYSTECTOMY  08/2018   COLON SURGERY     Flexible Sigmoidoscopy   COLONOSCOPY N/A  01/08/2014   Procedure: COLONOSCOPY;  Surgeon: Theda Belfast, MD;  Location: [facility];  Service: Endoscopy;  Laterality: N/A;   ESOPHAGOGASTRODUODENOSCOPY N/A 01/08/2014   Procedure: ESOPHAGOGASTRODUODENOSCOPY (EGD);  Surgeon: Theda Belfast, MD;  Location: [facility];  Service: Endoscopy;  Laterality: N/A;   LEFT HEART CATHETERIZATION WITH CORONARY ANGIOGRAM N/A 06/24/2013   Procedure: LEFT HEART CATHETERIZATION WITH CORONARY ANGIOGRAM;  Surgeon: [name], MD;  Location: [facility] CATH LAB;  Service: Cardiovascular;  Laterality: N/A;   LEFT HEART CATHETERIZATION WITH CORONARY ANGIOGRAM N/A 07/17/2013   Procedure: LEFT HEART CATHETERIZATION WITH CORONARY ANGIOGRAM;  Surgeon: Kathleene Hazel, MD;  Location: [facility] CATH LAB;  Service: Cardiovascular;  Laterality: N/A;   TONSILECTOMY, ADENOIDECTOMY, BILATERAL MYRINGOTOMY AND TUBES     WRIST SURGERY  1987   RIGHT; bone from forearm grafted to wrist    SOCIAL HISTORY: Social History   Socioeconomic History   Marital status: Married    Spouse name: [name]   Number of children: 3   Years of education: 12   Highest education level: Not on file  Occupational History   Occupation: CUSTOMER SERVICE    Employer: [employer]  Tobacco Use   Smoking status: Every Day    Packs/day: 1.00    Years: 20.00    Pack years: 20.00    Types: Cigarettes    Start date: 12/25/1982   Smokeless tobacco: Never   Tobacco comments:    09/03/19 < 1.5 ppd  Vaping Use   Vaping Use: Never used  Substance and Sexual Activity   Alcohol use: Not Currently    Comment: Drank 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   Social Determinants of Health   Financial Resource Strain: Not on file  Food Insecurity: Not  on file  Transportation Needs: Not on file  Physical Activity: Not on file  Stress: Not on file  Social Connections: Not on file  Intimate Partner Violence: Not on file    FAMILY HISTORY: Family History  Problem Relation Age of Onset   COPD Mother        emphysema   Lung cancer Mother    Heart disease Father 64       s/p 3V CABG   Depression Father    Barrett's esophagus Father    Mental illness Sister        depression   Depression Sister    Mental illness Son        ADHD, bipolar disorder   Diabetes Maternal Grandmother    Heart disease Maternal Grandmother    Diabetes Maternal Grandfather    Cancer Maternal Grandfather        lung cancer   Diabetes Paternal Grandmother    Kidney disease Paternal Grandmother    Diabetes Paternal Grandfather    Heart disease Paternal  Grandfather    Lung cancer Maternal Aunt     ALLERGIES:  is allergic to buprenorphine hcl, doxycycline, morphine, morphine and related, and ace inhibitors.  MEDICATIONS:  Current Outpatient Medications  Medication Sig Dispense Refill   acetaminophen-codeine (TYLENOL #3) 300-30 MG tablet Take 1 tablet by mouth every 8 (eight) hours as needed for moderate pain.      amLODipine (NORVASC) 5 MG tablet TAKE 1 TABLET BY MOUTH EVERY DAY 90 tablet 3   amphetamine-dextroamphetamine (ADDERALL) 15 MG tablet Take 0.5 tablets by mouth 2 (two) times daily. (Patient taking differently: Take 15 mg by mouth 2 (two) times daily.) 60 tablet 0   aspirin EC 81 MG tablet Take 81 mg by mouth daily.     ezetimibe (ZETIA) 10 MG tablet TAKE 1 TABLET(10 MG) BY MOUTH DAILY 90 tablet 3   folic acid (FOLVITE) 1 MG tablet Take 1 tablet (1 mg total) by mouth daily.     gabapentin (NEURONTIN) 400 MG capsule TAKE 1 CAPSULE(400 MG) BY MOUTH THREE TIMES DAILY 90 capsule 2   metoprolol tartrate (LOPRESSOR) 25 MG tablet Take 1 tablet (25 mg total) by mouth 2 (two) times daily. 60 tablet 2   Multiple Vitamin (MULTIVITAMIN WITH MINERALS) TABS  tablet Take 1 tablet by mouth daily. 30 tablet 0   nicotine (NICODERM CQ - DOSED IN MG/24 HOURS) 21 mg/24hr patch Place 1 patch (21 mg total) onto the skin daily. 28 patch 0   nitroGLYCERIN (NITROSTAT) 0.4 MG SL tablet Place 1 tablet (0.4 mg total) under the tongue every 5 (five) minutes as needed for chest pain. 25 tablet 3   omeprazole (PRILOSEC) 20 MG capsule Take 20 mg by mouth 2 (two) times daily before a meal.     ondansetron (ZOFRAN) 4 MG tablet Take 1 tablet (4 mg total) by mouth every 8 (eight) hours as needed for nausea or vomiting. 12 tablet 0   pilocarpine (SALAGEN) 5 MG tablet Take 5 mg by mouth 3 (three) times daily.      promethazine (PHENERGAN) 25 MG tablet Take 1 tablet (25 mg total) by mouth every 6 (six) hours as needed for nausea or vomiting. 30 tablet 0   pyridOXINE (B-6) 100 MG tablet Take 1 tablet (100 mg total) by mouth daily.     thiamine 100 MG tablet Take 1 tablet (100 mg total) by mouth daily.     valACYclovir (VALTREX) 500 MG tablet Take 500 mg by mouth 3 (three) times daily as needed (fever blister).     zolpidem (AMBIEN) 5 MG tablet Take 5 mg by mouth at bedtime.      No current facility-administered medications for this visit.    REVIEW OF SYSTEMS:   Constitutional: ( - ) fevers, ( - )  chills , ( - ) night sweats Eyes: ( - ) blurriness of vision, ( - ) double vision, ( - ) watery eyes Ears, nose, mouth, throat, and face: ( - ) mucositis, ( - ) sore throat Respiratory: ( - ) cough, ( - ) dyspnea, ( - ) wheezes Cardiovascular: ( - ) palpitation, ( - ) chest discomfort, ( - ) lower extremity swelling Gastrointestinal:  ( + ) nausea, ( - ) heartburn, ( - ) change in bowel habits Skin: ( - ) abnormal skin rashes Lymphatics: ( - ) new lymphadenopathy, ( - ) easy bruising Neurological: ( +) numbness, ( +) tingling, ( - ) new weaknesses Behavioral/Psych: ( - ) mood change, ( - ) new changes  All other systems were reviewed with the patient and are  negative.  PHYSICAL EXAMINATION: ECOG PERFORMANCE STATUS: 1 - Symptomatic but completely ambulatory  Vitals:   08/31/20 0834  BP: 125/74  Pulse: 73  Resp: 18  Temp: 97.7 F (36.5 C)  SpO2: 99%   Filed Weights   08/31/20 0834  Weight: 123 lb 8 oz (56 kg)    GENERAL: well appearing female in NAD  SKIN: skin color, texture, turgor are normal, no rashes or significant lesions EYES: conjunctiva are pink and non-injected, sclera clear OROPHARYNX: no exudate, no erythema; lips, buccal mucosa, and tongue normal  NECK: supple, non-tender LYMPH:  no palpable lymphadenopathy in the cervical, axillary or supraclavicular lymph nodes.  LUNGS: clear to auscultation and percussion with normal breathing effort HEART: regular rate & rhythm and no murmurs and no lower extremity edema ABDOMEN: soft, non-tender, non-distended, normal bowel sounds Musculoskeletal: no cyanosis of digits and no clubbing  PSYCH: alert & oriented x 3, fluent speech NEURO: no focal motor/sensory deficits  LABORATORY DATA:  I have reviewed the data as listed CBC Latest Ref Rng & Units 08/31/2020 05/31/2020 11/19/2019  WBC 4.0 - 10.5 K/uL 6.9 5.4 5.1  Hemoglobin 12.0 - 15.0 g/dL 15.5(H) 10.2(L) 8.6(L)  Hematocrit 36.0 - 46.0 % 44.9 32.4(L) 25.8(L)  Platelets 150 - 400 K/uL 230 233 177    CMP Latest Ref Rng & Units 08/31/2020 05/31/2020 11/19/2019  Glucose 70 - 99 mg/dL 426(S) 341(D) 622(W)  BUN 6 - 20 mg/dL 12 11 10   Creatinine 0.44 - 1.00 mg/dL 9.79 8.92)  Sodium 135 - 145 mmol/L 141 139 137  Potassium 3.5 - 5.1 mmol/L 4.9 3.9 4.2  Chloride 98 - 111 mmol/L 104 103 106  CO2 22 - 32 mmol/L 28 26 21(L)  Calcium 8.9 - 10.3 mg/dL 1.19(E 9.5 17.4)  Total Protein 6.5 - 8.1 g/dL 8.0 7.7 -  Total Bilirubin 0.3 - 1.2 mg/dL 0.4 0.3 -  Alkaline Phos 38 - 126 U/L 98 87 -  AST 15 - 41 U/L 39 20 -  ALT 0 - 44 U/L 38 13 -   ASSESSMENT & PLAN Lynn Harvey is a 55 y.o. female who returns for iron deficiency anemia.    #Iron deficiency anemia: --Received IV venofer x 5 doses from 06/10/2020-07/08/2020. Patient is unable to tolerate oral iron supplementation.  --Underwent colonoscopy and endoscopy on 08/25/2020. Colonoscopy revealed one benign appearing colon polyp. Enteroscopy revealed subtle appearing gastric AVMs, proximal jejunum is normal. Recommend VCE if Hgb does not improve --Labs today revealed improvement of iron deficiency anemia. Ferritin levels are 125, Hgb is 15.5, iron saturation has improved from 6% to 20%.   --No further intervention at this time.  --RTC 3 months with labs.   #Fatigue: --Uncertain etiology as energy levels have not improved with resolution of iron deficiency anemia. --Recommend for patient to follow up with PCP to evaluate for other causes  #Pruritic rash on bilateral hands: --Noted around 07/25/2020 per patient.  --Unlikely related to IV venofer since last infusion was on 07/08/2020.  --Patient denies trying any new creams/lotions, or been outside gardening or exposed to any allergens.  --Recommend to try Benadryl cream and follow up with PCP or dermatology  Orders Placed This Encounter  Procedures   CBC with Differential (Cancer Center Only)    Standing Status:   Future    Standing Expiration Date:   08/31/2021   Ferritin    Standing Status:   Future  Standing Expiration Date:   08/31/2021   Iron and TIBC    Standing Status:   Future    Standing Expiration Date:   08/31/2021   Retic Panel    Standing Status:   Future    Standing Expiration Date:   08/31/2021     All questions were answered. The patient knows to call the clinic with any problems, questions or concerns.  I have spent a total of 25 minutes minutes of face-to-face and non-face-to-face time, preparing to see the patient, obtaining and/or reviewing separately obtained history, performing a medically appropriate examination, counseling and educating the patient, ordering tests, documenting clinical  information in the electronic health record, and care coordination.    Georga KaufmannIrene Oyindamola Key, PA-C Department of Hematology/Oncology [address] [facility] Phone: [phone]

## 2020-09-13 ENCOUNTER — Ambulatory Visit: Payer: 59 | Admitting: Cardiovascular Disease

## 2020-09-29 ENCOUNTER — Telehealth: Payer: Self-pay | Admitting: *Deleted

## 2020-09-29 NOTE — Telephone Encounter (Signed)
IVIG order sheet received from [facility]. Form signed by Dr [name] and faxed back to [facility]. Received a receipt of confirmation.  Per last ov, continue IVIG.

## 2020-10-17 ENCOUNTER — Other Ambulatory Visit: Payer: Self-pay | Admitting: Student

## 2020-10-17 DIAGNOSIS — M79672 Pain in left foot: Secondary | ICD-10-CM

## 2020-10-27 ENCOUNTER — Other Ambulatory Visit: Payer: Self-pay | Admitting: Cardiovascular Disease

## 2020-11-02 ENCOUNTER — Ambulatory Visit: Payer: 59 | Admitting: Neurology

## 2020-11-05 ENCOUNTER — Ambulatory Visit
Admission: RE | Admit: 2020-11-05 | Discharge: 2020-11-05 | Disposition: A | Discharge status: Home / Self Care | Payer: 59 | Source: Ambulatory Visit | Attending: Student | Admitting: Student

## 2020-11-05 DIAGNOSIS — M79672 Pain in left foot: Secondary | ICD-10-CM

## 2020-11-20 ENCOUNTER — Other Ambulatory Visit: Payer: Self-pay | Admitting: Adult Health

## 2020-11-29 ENCOUNTER — Telehealth: Payer: Self-pay | Admitting: *Deleted

## 2020-11-29 NOTE — Telephone Encounter (Signed)
[facility] for IVIG orders Gammagard liquid 55g, divided over 2 days every 4 wks. Signed.  Fax confirmation received. [phone], [phone].

## 2020-11-30 ENCOUNTER — Other Ambulatory Visit: Payer: Self-pay

## 2020-11-30 ENCOUNTER — Inpatient Hospital Stay (HOSPITAL_BASED_OUTPATIENT_CLINIC_OR_DEPARTMENT_OTHER): Payer: 59 | Admitting: Physician Assistant

## 2020-11-30 ENCOUNTER — Inpatient Hospital Stay: Payer: 59 | Attending: Physician Assistant

## 2020-11-30 VITALS — BP 146/67 | HR 80 | Temp 98.2°F | Resp 17 | Wt 132.1 lb

## 2020-11-30 DIAGNOSIS — D509 Iron deficiency anemia, unspecified: Secondary | ICD-10-CM

## 2020-11-30 LAB — CBC WITH DIFFERENTIAL (CANCER CENTER ONLY)
Abs Immature Granulocytes: 0.03 10*3/uL (ref 0.00–0.07)
Basophils Absolute: 0.1 10*3/uL (ref 0.0–0.1)
Basophils Relative: 1 %
Eosinophils Absolute: 0.1 10*3/uL (ref 0.0–0.5)
Eosinophils Relative: 2 %
HCT: 43.3 % (ref 36.0–46.0)
Hemoglobin: 15 g/dL (ref 12.0–15.0)
Immature Granulocytes: 0 %
Lymphocytes Relative: 38 %
Lymphs Abs: 2.9 10*3/uL (ref 0.7–4.0)
MCH: 33.1 pg (ref 26.0–34.0)
MCHC: 34.6 g/dL (ref 30.0–36.0)
MCV: 95.6 fL (ref 80.0–100.0)
Monocytes Absolute: 0.6 10*3/uL (ref 0.1–1.0)
Monocytes Relative: 7 %
Neutro Abs: 4.1 10*3/uL (ref 1.7–7.7)
Neutrophils Relative %: 52 %
Platelet Count: 184 10*3/uL (ref 150–400)
RBC: 4.53 MIL/uL (ref 3.87–5.11)
RDW: 13.7 % (ref 11.5–15.5)
WBC Count: 7.8 10*3/uL (ref 4.0–10.5)
nRBC: 0 % (ref 0.0–0.2)

## 2020-11-30 LAB — RETIC PANEL
Immature Retic Fract: 15.2 % (ref 2.3–15.9)
RBC.: 4.48 MIL/uL (ref 3.87–5.11)
Retic Count, Absolute: 122.8 10*3/uL (ref 19.0–186.0)
Retic Ct Pct: 2.7 % (ref 0.4–3.1)
Reticulocyte Hemoglobin: 38.5 pg (ref >27.9)

## 2020-11-30 LAB — IRON AND TIBC
Iron: 72 ug/dL (ref 28–170)
Saturation Ratios: 17 % (ref 10.4–31.8)
TIBC: 419 ug/dL (ref 250–450)
UIBC: 347 ug/dL

## 2020-11-30 LAB — FERRITIN: Ferritin: 153 ng/mL (ref 11–307)

## 2020-11-30 NOTE — Progress Notes (Signed)
[facility] Telephone:([phone]) [phone]   Fax:([phone]) [phone]  PROGRESS NOTE  Patient Care Team: [name], MD as PCP - General (Internal Medicine) [name], MD as PCP - Cardiology (Cardiology) Sherian Rein, MD as Consulting Physician (Obstetrics and Gynecology) [name], MD as Consulting Physician (Cardiology) Jeani Hawking, MD as Consulting Physician (Gastroenterology) Scarlett Presto as Physician Assistant (Neurology)  Hematological/Oncological History 1) 12/17/2013: Presented to ED for chest pain and rectal bleeding. Hemoglobin dropped from a baseline of 12 to 9.5. Positive hemoccult.   2)01/08/2014: EGD and Colonoscopy performed by Dr. Jeani Hawking for iron deficiency anemia. Unremarkable. Recommended capsular endoscopy. Patient received IV iron transfusions since she could not tolerate oral iron.   3) 08/28/2016: Hgb 8.1 (L), Iron 16 (L), Ferritin 14(L), TIBC 454, Iron saturation 4% (L), Transferrin 337. Received 2 doses of IV iron.   4)10/12/2016: Iron 89, TIBC 310, Ferritin 523 (H)  5) 10/19/2016: EGD/Colonoscopy: Hyperplastic colon polyp and grade 3 internal hemorrhoids. SB biopsies negative for sprue. 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 No evidence of varices, esophagitis, stricture or Barrett's.   7) 11/19/2019: Iron 70, TIBC 335, Iron saturation 21%, Ferritin 267  8) 05/18/2020: Transferrin 390 (H), Ferritin 8 (L), TIBC 546 (H), Iron saturation 11% (L)  9) 05/31/2020: Established care with Georga Kaufmann PA-C  10) 06/10/2020-07/08/2020: Received IV venofer x 5 doses.   11) 08/25/2020: Colonoscopy revealed one benign appearing colon polyp. Enteroscopy revealed subtle appearing gastric AVMs, proximal jejunum is normal. Recommend VCE if Hgb does not improve  CHIEF COMPLAINTS: Iron deficiency anemia  HISTORY OF  PRESENTING ILLNESS:  Lynn Harvey 55 y.o. female who returns for a follow up for iron deficiency anemia.  Patient is unaccompanied for this visit.  She reports her energy levels are overall stable. She does have chronic fatigue but continues to complete her ADLs on her own. She reports having nausea mainly in the morning that improves after eating breakfast. She denies any vomiting episodes. Patient denies any abdominal pain and bowel movements are regular. She denies easy bruising or signs of bleeding. he denies any fevers, chills, shortness of breath, chest pain or cough. She has no other complaints. Rest of the 10 point ROS is below.   MEDICAL HISTORY:  Past Medical History:  Diagnosis Date   Acute sensory neuropathy 09/18/2019   ADD (attention deficit disorder)    Allergy    Anemia    Anxiety    Coronary artery disease    a. s/p NSTEMI in 2015 with angioplasty alone to mid-LAD, repeat cath within the same month showing a patent site   Depression    Depression    Fatty liver    GAD (generalized anxiety disorder)    GERD (gastroesophageal reflux disease)    Gestational diabetes    History of heart attack    Hyperlipidemia    Hypertension    Insomnia    Lupus (HCC)    Milia    Myocardial infarction (HCC) 06/2013   Rectal abnormality    Sicca syndrome (HCC)    Sjogren's syndrome (HCC)    Telangiectasia    Vitamin D deficiency     SURGICAL HISTORY: Past Surgical History:  Procedure Laterality Date   ABDOMINAL HYSTERECTOMY     fibroids, endometrosis.    CHOLECYSTECTOMY  08/2018   COLON SURGERY     Flexible Sigmoidoscopy  COLONOSCOPY N/A 01/08/2014   Procedure: COLONOSCOPY;  Surgeon: Theda Belfast, MD;  Location: [facility];  Service: Endoscopy;  Laterality: N/A;   ESOPHAGOGASTRODUODENOSCOPY N/A 01/08/2014   Procedure: ESOPHAGOGASTRODUODENOSCOPY (EGD);  Surgeon: Theda Belfast, MD;  Location: [facility];  Service: Endoscopy;  Laterality: N/A;   LEFT HEART  CATHETERIZATION WITH CORONARY ANGIOGRAM N/A 06/24/2013   Procedure: LEFT HEART CATHETERIZATION WITH CORONARY ANGIOGRAM;  Surgeon: [name], MD;  [facility];  Service: Cardiovascular;  Laterality: N/A;   LEFT HEART CATHETERIZATION WITH CORONARY ANGIOGRAM N/A 07/17/2013   Procedure: LEFT HEART CATHETERIZATION WITH CORONARY ANGIOGRAM;  Surgeon: Kathleene Hazel, MD;  Location: [facility] CATH LAB;  Service: Cardiovascular;  Laterality: N/A;   TONSILECTOMY, ADENOIDECTOMY, BILATERAL MYRINGOTOMY AND TUBES     WRIST SURGERY  1987   RIGHT; bone from forearm grafted to wrist    SOCIAL HISTORY: Social History   Socioeconomic History   Marital status: Married    Spouse name: [name]   Number of children: 3   Years of education: 12   Highest education level: Not on file  Occupational History   Occupation: CUSTOMER SERVICE    Employer: [employer]  Tobacco Use   Smoking status: Every Day    Packs/day: 1.00    Years: 20.00    Pack years: 20.00    Types: Cigarettes    Start date: 12/25/1982   Smokeless tobacco: Never   Tobacco comments:    09/03/19 < 1.5 ppd  Vaping Use   Vaping Use: Never used  Substance and Sexual Activity   Alcohol use: Not Currently    Comment: Drank 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   Social Determinants of Health   Financial Resource Strain: Not on file  Food Insecurity: Not on file  Transportation Needs: Not on file  Physical Activity: Not on file  Stress: Not on file  Social Connections: Not on file  Intimate Partner Violence: Not on file    FAMILY HISTORY: Family History  Problem Relation Age of Onset   COPD Mother        emphysema   Lung cancer Mother    Heart disease Father 50        s/p 3V CABG   Depression Father    Barrett's esophagus Father    Mental illness Sister        depression   Depression Sister    Mental illness Son        ADHD, bipolar disorder   Diabetes Maternal Grandmother    Heart disease Maternal Grandmother    Diabetes Maternal Grandfather    Cancer Maternal Grandfather        lung cancer   Diabetes Paternal Grandmother    Kidney disease Paternal Grandmother    Diabetes Paternal Grandfather    Heart disease Paternal Grandfather    Lung cancer Maternal Aunt     ALLERGIES:  is allergic to buprenorphine hcl, doxycycline, morphine, morphine and related, and ace inhibitors.  MEDICATIONS:  Current Outpatient Medications  Medication Sig Dispense Refill   acetaminophen-codeine (TYLENOL #3) 300-30 MG tablet Take 1 tablet by mouth every 8 (eight) hours as needed for moderate pain.  amLODipine (NORVASC) 5 MG tablet TAKE 1 TABLET BY MOUTH EVERY DAY 90 tablet 3   amphetamine-dextroamphetamine (ADDERALL) 15 MG tablet Take 0.5 tablets by mouth 2 (two) times daily. (Patient taking differently: Take 15 mg by mouth 2 (two) times daily.) 60 tablet 0   aspirin EC 81 MG tablet Take 81 mg by mouth daily.     ezetimibe (ZETIA) 10 MG tablet TAKE 1 TABLET(10 MG) BY MOUTH DAILY 90 tablet 3   folic acid (FOLVITE) 1 MG tablet Take 1 tablet (1 mg total) by mouth daily.     gabapentin (NEURONTIN) 400 MG capsule Take 1 capsule (400 mg total) by mouth 3 (three) times daily. Must be seen for further refills. 90 capsule 1   metoprolol tartrate (LOPRESSOR) 25 MG tablet Take 1 tablet (25 mg total) by mouth 2 (two) times daily. 60 tablet 2   Multiple Vitamin (MULTIVITAMIN WITH MINERALS) TABS tablet Take 1 tablet by mouth daily. 30 tablet 0   omeprazole (PRILOSEC) 20 MG capsule Take 20 mg by mouth 2 (two) times daily before a meal.     ondansetron (ZOFRAN) 4 MG tablet Take 1 tablet (4 mg total) by mouth every 8 (eight) hours as needed for nausea or vomiting. 12 tablet 0    pilocarpine (SALAGEN) 5 MG tablet Take 5 mg by mouth 3 (three) times daily.      pyridOXINE (B-6) 100 MG tablet Take 1 tablet (100 mg total) by mouth daily.     thiamine 100 MG tablet Take 1 tablet (100 mg total) by mouth daily.     valACYclovir (VALTREX) 500 MG tablet Take 500 mg by mouth 3 (three) times daily as needed (fever blister).     zolpidem (AMBIEN) 5 MG tablet Take 5 mg by mouth at bedtime.      nicotine (NICODERM CQ - DOSED IN MG/24 HOURS) 21 mg/24hr patch Place 1 patch (21 mg total) onto the skin daily. 28 patch 0   nitroGLYCERIN (NITROSTAT) 0.4 MG SL tablet Place 1 tablet (0.4 mg total) under the tongue every 5 (five) minutes as needed for chest pain. 25 tablet 3   promethazine (PHENERGAN) 25 MG tablet Take 1 tablet (25 mg total) by mouth every 6 (six) hours as needed for nausea or vomiting. (Patient not taking: Reported on 11/30/2020) 30 tablet 0   No current facility-administered medications for this visit.    REVIEW OF SYSTEMS:   Constitutional: ( - ) fevers, ( - )  chills , ( - ) night sweats Eyes: ( - ) blurriness of vision, ( - ) double vision, ( - ) watery eyes Ears, nose, mouth, throat, and face: ( - ) mucositis, ( - ) sore throat Respiratory: ( - ) cough, ( - ) dyspnea, ( - ) wheezes Cardiovascular: ( - ) palpitation, ( - ) chest discomfort, ( - ) lower extremity swelling Gastrointestinal:  ( + ) nausea, ( - ) heartburn, ( - ) change in bowel habits Skin: ( - ) abnormal skin rashes Lymphatics: ( - ) new lymphadenopathy, ( - ) easy bruising Neurological: ( +) numbness, ( +) tingling, ( - ) new weaknesses Behavioral/Psych: ( - ) mood change, ( - ) new changes  All other systems were reviewed with the patient and are negative.  PHYSICAL EXAMINATION: ECOG PERFORMANCE STATUS: 1 - Symptomatic but completely ambulatory  Vitals:   11/30/20 1005  BP: (!) 146/67  Pulse: 80  Resp: 17  Temp: 98.2 F (36.8 C)  SpO2: 97%  Filed Weights   11/30/20 1005  Weight: 132 lb  1.6 oz (59.9 kg)    GENERAL: well appearing female in NAD  SKIN: skin color, texture, turgor are normal, no rashes or significant lesions EYES: conjunctiva are pink and non-injected, sclera clear OROPHARYNX: no exudate, no erythema; lips, buccal mucosa, and tongue normal  NECK: supple, non-tender LYMPH:  no palpable lymphadenopathy in the cervical, axillary or supraclavicular lymph nodes.  LUNGS: clear to auscultation and percussion with normal breathing effort HEART: regular rate & rhythm and no murmurs and no lower extremity edema ABDOMEN: soft, non-tender, non-distended, normal bowel sounds Musculoskeletal: no cyanosis of digits and no clubbing  PSYCH: alert & oriented x 3, fluent speech NEURO: no focal motor/sensory deficits  LABORATORY DATA:  I have reviewed the data as listed CBC Latest Ref Rng & Units 11/30/2020 08/31/2020 05/31/2020  WBC 4.0 - 10.5 K/uL 7.8 6.9 5.4  Hemoglobin 12.0 - 15.0 g/dL 37.6 15.5(H) 10.2(L)  Hematocrit 36.0 - 46.0 % 43.3 44.9 32.4(L)  Platelets 150 - 400 K/uL 184 230 233    CMP Latest Ref Rng & Units 08/31/2020 05/31/2020 11/19/2019  Glucose 70 - 99 mg/dL 283(T) 517(O) 160(V)  BUN 6 - 20 mg/dL 12 11 10   Creatinine 0.44 - 1.00 mg/dL 3.71 0.62)  Sodium 135 - 145 mmol/L 141 139 137  Potassium 3.5 - 5.1 mmol/L 4.9 3.9 4.2  Chloride 98 - 111 mmol/L 104 103 106  CO2 22 - 32 mmol/L 28 26 21(L)  Calcium 8.9 - 10.3 mg/dL 6.94(W 9.5 54.6)  Total Protein 6.5 - 8.1 g/dL 8.0 7.7 -  Total Bilirubin 0.3 - 1.2 mg/dL 0.4 0.3 -  Alkaline Phos 38 - 126 U/L 98 87 -  AST 15 - 41 U/L 39 20 -  ALT 0 - 44 U/L 38 13 -   ASSESSMENT & PLAN Lynn Harvey is a 55 y.o. female who returns for iron deficiency anemia.   #Iron deficiency anemia: --Received IV venofer x 5 doses from 06/10/2020-07/08/2020. Patient is unable to tolerate oral iron supplementation.  --Underwent colonoscopy and endoscopy on 08/25/2020. Colonoscopy revealed one benign appearing colon polyp.  Enteroscopy revealed subtle appearing gastric AVMs, proximal jejunum is normal. Recommend VCE if Hgb does not improve --Labs today show no evidence of of anemia. Hgb is normal at 15.0 and Ferritin is 153 --Patient will follow labs with her PCP moving forward and return to the clinic as needed.   #Fatigue: --Uncertain etiology as energy levels have not improved with resolution of iron deficiency anemia. --Recommend for patient to follow up with PCP to evaluate for other causes  #Pruritic rash on bilateral hands: --Noted around 07/25/2020 per patient.  --Unlikely related to IV venofer since last infusion was on 07/08/2020.  --Patient denies trying any new creams/lotions, or been outside gardening or exposed to any allergens.  --Recommended to try Benadryl cream and follow up with PCP or dermatology --Symptoms resolved at today's visit.   No orders of the defined types were placed in this encounter.  All questions were answered. The patient knows to call the clinic with any problems, questions or concerns.  I have spent a total of 25 minutes minutes of face-to-face and non-face-to-face time, preparing to see the patient, obtaining and/or reviewing separately obtained history, performing a medically appropriate examination, counseling and educating the patient, ordering tests, documenting clinical information in the electronic health record, and care coordination.    07/10/2020, PA-C Department of Hematology/Oncology [facility] at  [facility] Phone: [phone]

## 2020-12-01 ENCOUNTER — Encounter: Payer: Self-pay | Admitting: Physician Assistant

## 2020-12-29 NOTE — Telephone Encounter (Signed)
Received plan of care papers from [facility] for gammagard liquid 55g, divided over 2 days every 4 weeks. Form signed and faxed back. Received a receipt of confirmation.

## 2021-01-16 ENCOUNTER — Other Ambulatory Visit: Payer: Self-pay | Admitting: Adult Health

## 2021-01-16 ENCOUNTER — Other Ambulatory Visit: Payer: Self-pay | Admitting: Cardiovascular Disease

## 2021-01-16 ENCOUNTER — Ambulatory Visit: Payer: 59 | Admitting: Cardiovascular Disease

## 2021-02-06 ENCOUNTER — Other Ambulatory Visit: Payer: Self-pay | Admitting: Cardiovascular Disease

## 2021-03-12 ENCOUNTER — Other Ambulatory Visit: Payer: Self-pay | Admitting: Adult Health

## 2021-03-13 ENCOUNTER — Encounter: Payer: Self-pay | Admitting: Physician Assistant

## 2021-03-15 ENCOUNTER — Telehealth: Payer: Self-pay | Admitting: Adult Health

## 2021-03-15 NOTE — Telephone Encounter (Signed)
[facility] handles the PAs for this patient's therapy. I have reached out urgently to [facility] w/ [facility] to see what is needed and if they are already working on this. Will update when I hear back.

## 2021-03-15 NOTE — Telephone Encounter (Signed)
Updated pt that letter is in process by [facility]. She was appreciative.

## 2021-03-15 NOTE — Telephone Encounter (Signed)
Lynn Harvey said they will be drafting a letter. Will update when I hear back again.

## 2021-03-15 NOTE — Telephone Encounter (Signed)
Pt called stating that her insurance [employer] Tier this year is needing an Urgent Exception Letter stating why the pt is needing to receive the Gammagard liquid 55g infusion so they can cover it. Please advise.

## 2021-03-16 ENCOUNTER — Telehealth: Payer: Self-pay | Admitting: Neurology

## 2021-03-16 NOTE — Telephone Encounter (Signed)
Received call from [facility] w/ [facility]. Need LMN signed and faxed back to her so it can be submitted to plan. Letter signed by Aundra Millet NP. Faxed to [facility] attn [place] along with last office note. Received a receipt of confirmation.

## 2021-03-16 NOTE — Telephone Encounter (Signed)
I lvm for patient to call us back to reschedule due to Dr. [name] being out.

## 2021-03-20 ENCOUNTER — Ambulatory Visit: Payer: Self-pay | Admitting: Neurology

## 2021-03-21 ENCOUNTER — Telehealth: Payer: Self-pay | Admitting: Neurology

## 2021-03-21 NOTE — Telephone Encounter (Signed)
I called the # left and was not able to speak to PA dept. I had to LM.  REF # [id number].  Trying to finish the PA for this Gammaguard.

## 2021-03-21 NOTE — Telephone Encounter (Signed)
[employer] called in asking for a call back from clinical staff to finish pt's PA. Please advise at [phone].

## 2021-03-22 NOTE — Telephone Encounter (Signed)
I called [employer] back and was on the phone approx. 30 minutes, transferred 2 times. While on the phone, I received a call separately from [facility] in [place]. They had specific clinical questions for the IVIG regarding diagnosis and testing. I spoke with Dr [name]. At this time the patient needs to be re-evaluated. Dr [name] will see her on February 14th. I have canceled the PA request and I updated Carmen w/ [facility].

## 2021-03-22 NOTE — Telephone Encounter (Signed)
Bethany RN has called [employer] to discuss.

## 2021-03-23 NOTE — Telephone Encounter (Signed)
Per Porfirio Mylar w/ [facility], pt's infusion schedule has been every 4 weeks and her last dose infusion / delivery was around Dec. 20th according to their records, so she would have been due around Jan. 20th.

## 2021-03-23 NOTE — Telephone Encounter (Signed)
Margaret w/ [employer] called again and said there was another Gammagard request that came to them from [facility] yesterday around 4 pm. She wanted to confirm the decision is still to cancel the request for now and I confirmed it was.

## 2021-03-23 NOTE — Telephone Encounter (Addendum)
Spoke with patient.  I let her know we were unable to get her IVIG at this time.  She does need to be reevaluated by Dr. [name] and perhaps further testing done.  She was disappointed but verbalized understanding and said she was due for her infusion on 02/22/21.  She has noticed the IVIG wearing off.  She states she does have more burning in her feet, it is moving up her legs to her knees.  She feels sensation changes in her hands and it is difficult to type and write.  She feels like her balance is off and she states driving is scary because she does not feel her feet on her pedal well.  I let her know I would place her on the wait list.  I have her scheduled to see Dr. [name] on 04/04/2021 at 8:30 AM.  Patient verbalized appreciation for the call and her questions were answered.

## 2021-03-27 ENCOUNTER — Encounter: Payer: Self-pay | Admitting: Physician Assistant

## 2021-03-27 NOTE — Telephone Encounter (Signed)
Health advocate Coy Saunas, RN called because patient called her about the IVIG not being approved. I confirmed with her that the patient has an appointment on 2/14 for IVIG reevaluation and she is on the wait list. I entered her new [employer] info into her chart. She wanted to let us know that with the new insurance the medication will need a PA and it will be through [employer] fax number [phone]. Rosemary's direct line is [phone] ext. [phone]. She said feel free to call her with any questions.

## 2021-04-04 ENCOUNTER — Telehealth: Payer: Self-pay | Admitting: *Deleted

## 2021-04-04 ENCOUNTER — Encounter: Payer: Self-pay | Admitting: Neurology

## 2021-04-04 ENCOUNTER — Encounter: Payer: Self-pay | Admitting: Physician Assistant

## 2021-04-04 ENCOUNTER — Ambulatory Visit (INDEPENDENT_AMBULATORY_CARE_PROVIDER_SITE_OTHER): Payer: BC Managed Care – PPO | Admitting: Neurology

## 2021-04-04 ENCOUNTER — Other Ambulatory Visit: Payer: Self-pay

## 2021-04-04 VITALS — BP 145/83 | HR 80 | Ht 63.0 in | Wt 138.2 lb

## 2021-04-04 DIAGNOSIS — G6181 Chronic inflammatory demyelinating polyneuritis: Secondary | ICD-10-CM | POA: Diagnosis not present

## 2021-04-04 DIAGNOSIS — R200 Anesthesia of skin: Secondary | ICD-10-CM | POA: Diagnosis not present

## 2021-04-04 DIAGNOSIS — Z820 Family history of epilepsy and other diseases of the nervous system: Secondary | ICD-10-CM

## 2021-04-04 DIAGNOSIS — G619 Inflammatory polyneuropathy, unspecified: Secondary | ICD-10-CM | POA: Diagnosis not present

## 2021-04-04 DIAGNOSIS — R202 Paresthesia of skin: Secondary | ICD-10-CM

## 2021-04-04 DIAGNOSIS — R9082 White matter disease, unspecified: Secondary | ICD-10-CM

## 2021-04-04 NOTE — Progress Notes (Addendum)
[facility]    Provider:  Dr Jaynee Eagles Requesting Provider: Karleen Hampshire., MD Primary Care Provider:  Karleen Hampshire., MD  CC: "Numbness, tingling, pain everywhere, feet hands at the worst, sharp lightning pains, all started right after 2nd Covid shot "  Addendum 07/11/2021: Lynn Harvey, MRI of your brain is stable, no changes since 2021, I reviewed the imaging with the neuroradiologist as well. I do not believe this is Multiple Sclerosis. Also the fact that your brain has been stable without changes for 2 years also makes Lynn less likely. And your cervical spine was normal, no Lynn lesions were seen in the cervical spine. My recommendation would be to repeat MRI of the brain in a year. However I am more than happy to send you to an [place] specialist at [facility] if you want another opinion, thanks, Dr. Jaynee Eagles  04/04/2021: Initially saw patient in July 2021 for acute onset paresthesias after COVID shot likely due to autoimmune mechanism/inflammatory given her history of rheumatologic disorders.  Was admitted inpatient and given IVIG with improvement. Past medical history hot flashes, hypertension, bilateral carotid artery disease, mouth dryness, sicca syndrome, GERD, fatty liver, intractable vomiting with nausea, hyperlipidemia, elevated glucose, recurrent major depression, generalized anxiety disorder, edema, insomnia, Sjogren's, prediabetes.  She was last seen by her nurse practitioner in March 2022 where she felt stable/improved on IVIG and on gabapentin. She sees Rheumatology, in [place], [place], I reviewed notes, she has sicca syndrome, arthralgia, Sjogren's, she is not on an immunosuppressant and I discussed with patient that possibly we could talk to her rheumatologist and see if an immunosuppressant agent might be better than getting IVIG every month at this point.  I would have to defer to rheumatology on what would be the best and immunosuppressant given her rheumatologic disorders.   In the meantime we can continue the IVIG and the gabapentin but may consider transferring all her care since this is an autoimmune disorder. She is feeling worse since not getting January IVIG, she has more numbness in the fingers and burning and in her feet and now moving up to the knees. Her balance is impaired.  04/28/2020: Lynn Givens Lynn. Harvey is a 56 year old female with a history of inflammatory neuropathy; likely autoimmune or inflammatory neuropathy given her history of rheumatologic disorders, she also continues to follow with rheumatology.  She returns today for follow-up.  She remains on gabapentin 400 mg 3 times a day.  She reports that her balance has improved.  She feels it is back to her baseline.  She continues to get numbness and sharp shooting pain in the hands and legs.  She does feel that gabapentin has offered her improvement.  She states that if she misses the gabapentin dose then the pain returns.  She continues on IVIG.  She follows with rheumatology.  She is not drink alcohol since September.   10/04/2019: Patient with acute onset paresthesias in the setting of covid vaccine likely due to autoimmune mechanism/inflammatory. Was admitted inpatient and treated with IVIG with improvement. 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 Patient still reports pain but she has appeared to stabilize and improve since being treated with IVIG. We will be continuing IVIG and sending patient to pain management. In addition she has bilateral Carpal Tunnel syndrome and right Ulnar neuropathy at the elbow and we will refer to Dr. Amedeo Plenty at [facility] for treatment. Discussed results with patient and management plans. Answered questions.  HPI 09/03/2019:  Lynn Harvey is a 56 y.o. female here as requested by Karleen Hampshire., MD for gait abnormality.  Past medical history hot  flashes, hypertension, bilateral carotid artery disease, mouth dryness, sicca syndrome, GERD, fatty liver, intractable vomiting with nausea, hyperlipidemia, elevated glucose, recurrent major depression, generalized anxiety disorder, edema, insomnia, Sjogren's, prediabetes.  She has an allergy to buprenorphine, morphine, ACE inhibitors and doxycycline.  I reviewed Dr. Dian Situ notes that were provided which had no history absolutely of any symptoms in them.  She is a current every day smoker.  Here with her husband who also provides much information. June 23rd she had her second covid shot and around 6-7pm that night her face became numb like sh ehad a shot of novacaine, the back of her head, then all over her body. She got up and she had no "maneuverability", started in the head and immediately went all the way to the feet. She went to the emergency roo, she was vomiting. They kept jer overnight, the next morning could walk and they let her go home and she wasn't throwing up anymore, she still had symptoms afterwards but 5 days later had an acute episode again. She was in triage for 6 hours and by then she could walk again. The symptoms have not resolved, continuously worse, hands and deet are on fire, she can;t open jars. Her legs hurt at bedtime, she has lightning and shppting pain in the legs and the hands. The whole body is affected, husband says she will wake up crying.Marland Kitchen Her bowel movements are bad. She is also drinking daily, she is due for an abdominal ultrasound this month, she has a long history of alcohol use a 1/2 gallon every 2 days, but not as much lately she drinks beer and white claw. The pain is getting worse, She was given gabapentin for the nerve pain but it knocks her out.   Reviewed notes, labs and imaging from outside physicians, which showed:  MRI cervical 12/2013-spine personally reviewed images and agree:  IMPRESSION: 1. Rightward disc herniations at C5-C6 and C6-C7, more pronounced  at the former. Mild C5-C6 spinal stenosis and moderate right C6 foraminal stenosis. Mild right C7 foraminal stenosis. 2. Moderate to severe left facet degeneration at C3-C4. Associated moderate left C4 foraminal stenosis.    Review of Systems: Patient complains of symptoms per HPI as well as the following symptoms: numbness and tingling . Pertinent negatives and positives per HPI. All others negative    Social History   Socioeconomic History   Marital status: Married    Spouse name: Barbaraann Rondo   Number of children: 3   Years of education: 12   Highest education level: Not on file  Occupational History   Occupation: CUSTOMER SERVICE    Employer: [employer]  Tobacco Use   Smoking status: Every Day    Packs/day: 1.00    Years: 20.00    Pack years: 20.00    Types: Cigarettes    Start date: 12/25/1982   Smokeless tobacco: Never   Tobacco comments:    09/03/19 < 1.5 ppd  Vaping Use   Vaping Use: Never used  Substance and Sexual Activity   Alcohol use: Not Currently    Comment: Drank 24 cans/week until September 2021   Drug use: No   Sexual activity: Yes    Partners: Male    Birth control/protection: Surgical  Other Topics  Concern   Not on file  Social History Narrative   Lives with her husband, their son Yong Channel, and her daughter Museum/gallery conservator and Amber's son [name].   Social Determinants of Health   Financial Resource Strain: Not on file  Food Insecurity: Not on file  Transportation Needs: Not on file  Physical Activity: Not on file  Stress: Not on file  Social Connections: Not on file  Intimate Partner Violence: Not on file    Family History  Problem Relation Age of Onset   COPD Mother        emphysema   Lung cancer Mother    Heart disease Father 75       s/p 3V CABG   Depression Father    Barrett's esophagus Father    Mental illness Sister        depression   Depression Sister    Mental illness Son        ADHD, bipolar disorder   Diabetes Maternal  Grandmother    Heart disease Maternal Grandmother    Diabetes Maternal Grandfather    Cancer Maternal Grandfather        lung cancer   Diabetes Paternal Grandmother    Kidney disease Paternal Grandmother    Diabetes Paternal Grandfather    Heart disease Paternal Grandfather    Lung cancer Maternal Aunt     Past Medical History:  Diagnosis Date   Acute sensory neuropathy 09/18/2019   ADD (attention deficit disorder)    Allergy    Anemia    Anxiety    Coronary artery disease    a. s/p NSTEMI in 2015 with angioplasty alone to mid-LAD, repeat cath within the same month showing a patent site   Depression    Depression    Fatty liver    GAD (generalized anxiety disorder)    GERD (gastroesophageal reflux disease)    Gestational diabetes    History of heart attack    Hyperlipidemia    Hypertension    Insomnia    Lupus ([place])    Milia    Myocardial infarction ([place]) 06/2013   Rectal abnormality    Sicca syndrome ([place])    Sjogren's syndrome ([employer])    Telangiectasia    Vitamin D deficiency     Patient Active Problem List   Diagnosis Date Noted   E. 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[place]) 10/01/2019   Bilateral carpal tunnel syndrome 10/01/2019   Ulnar neuropathy at elbow, right 10/01/2019   Paresthesias 10/01/2019   Acute sensory neuropathy 09/18/2019   Complex ovarian cyst 10/12/2016   Presence of granulation tissue 10/12/2016   Hemorrhagic cyst of ovary 10/12/2016   Edema 08/24/2016   Drug therapy 12/20/2015   Sjogren's syndrome ([place]) 12/20/2015   Elevated  liver function tests 12/19/2015   History of myocardial infarction 12/06/2015   ANA positive 08/18/2015   Mouth dryness 08/18/2015   GAD (generalized anxiety disorder) 08/17/2015   Recurrent major depressive disorder ([place]) 08/17/2015   Left foot pain 07/04/2015   Telangiectasia 07/04/2015   Elevated glucose 05/04/2015   Milia 05/04/2015   Vitamin D deficiency 05/04/2015   Anemia  04/27/2015   Fatigue 04/27/2015   Health care maintenance 04/27/2015   History of MI (myocardial infarction) 04/27/2015   Hot flashes 04/27/2015   Low back pain radiating to both legs 03/10/2014   Bilateral carotid artery disease ([place]) 02/17/2014   First degree hemorrhoids 01/15/2014   Gastroesophageal reflux disease without esophagitis 01/15/2014   Iron deficiency anemia 01/13/2014   RBC microcytosis 01/13/2014   Coronary artery disease involving native coronary artery of native heart without angina pectoris 12/17/2013   Essential hypertension 06/25/2013   High cholesterol 06/25/2013   Chest pain with moderate risk of acute coronary syndrome 06/24/2013   NSTEMI (non-ST elevated myocardial infarction) ([place]) 06/24/2013   Myocardial infarction ([place]) 06/19/2013   Tobacco abuse 06/10/2012   Allergic rhinitis 06/10/2012   ADD (attention deficit disorder) 03/06/2012   Depressive disorder 04/12/2011    Past Surgical History:  Procedure Laterality Date   ABDOMINAL HYSTERECTOMY     fibroids, endometrosis.    CHOLECYSTECTOMY  08/2018   COLON SURGERY     Flexible Sigmoidoscopy   COLONOSCOPY N/A 01/08/2014   Procedure: COLONOSCOPY;  Surgeon: Beryle Beams, MD;  Location: [facility];  Service: Endoscopy;  Laterality: N/A;   ESOPHAGOGASTRODUODENOSCOPY N/A 01/08/2014   Procedure: ESOPHAGOGASTRODUODENOSCOPY (EGD);  Surgeon: Beryle Beams, MD;  Location: [facility];  Service: Endoscopy;  Laterality: N/A;   LEFT HEART CATHETERIZATION WITH CORONARY ANGIOGRAM N/A 06/24/2013   Procedure: LEFT HEART  CATHETERIZATION WITH CORONARY ANGIOGRAM;  Surgeon: Troy Sine, MD;  Location: [facility] CATH LAB;  Service: Cardiovascular;  Laterality: N/A;   LEFT HEART CATHETERIZATION WITH CORONARY ANGIOGRAM N/A 07/17/2013   Procedure: LEFT HEART CATHETERIZATION WITH CORONARY ANGIOGRAM;  Surgeon: Burnell Blanks, MD;  Location: [facility] CATH LAB;  Service: Cardiovascular;  Laterality: N/A;   TONSILECTOMY, ADENOIDECTOMY, BILATERAL MYRINGOTOMY AND TUBES     WRIST SURGERY  1987   RIGHT; bone from forearm grafted to wrist    Current Outpatient Medications  Medication Sig Dispense Refill   acetaminophen-codeine (TYLENOL #3) 300-30 MG tablet Take 1 tablet by mouth every 8 (eight) hours as needed for moderate pain.      amLODipine (NORVASC) 5 MG tablet TAKE 1 TABLET BY MOUTH EVERY DAY 90 tablet 3   amphetamine-dextroamphetamine (ADDERALL) 15 MG tablet Take 0.5 tablets by mouth 2 (two) times daily. (Patient taking differently: Take 15 mg by mouth 2 (two) times daily.) 60 tablet 0   aspirin EC 81 MG tablet Take 81 mg by mouth daily.     ezetimibe (ZETIA) 10 MG tablet TAKE 1 TABLET(10 MG) BY MOUTH DAILY 90 tablet 3   folic acid (FOLVITE) 1 MG tablet Take 1 tablet (1 mg total) by mouth daily.     gabapentin (NEURONTIN) 400 MG capsule TAKE 1 CAPSULE(400 MG) BY MOUTH THREE TIMES DAILY 90 capsule 1   metoprolol tartrate (LOPRESSOR) 25 MG tablet Take 1 tablet (25 mg total) by mouth 2 (two) times daily. 60 tablet 2   Multiple Vitamin (MULTIVITAMIN WITH MINERALS) TABS tablet Take 1 tablet by mouth daily. 30 tablet 0   nicotine (NICODERM CQ - DOSED IN MG/24 HOURS) 21 mg/24hr patch Place 1 patch (21 mg total) onto the skin daily. 28 patch 0   nitroGLYCERIN (NITROSTAT) 0.4 MG SL tablet DISSOLVE 1 TABLET UNDER THE TONGUE EVERY 5 MINUTES AS NEEDED FOR CHEST PAIN 45 tablet 1   omeprazole (PRILOSEC) 20 MG capsule Take 20 mg by mouth 2 (two) times daily before a meal.     ondansetron (ZOFRAN) 4 MG  tablet Take 1 tablet (4 mg total) by  mouth every 8 (eight) hours as needed for nausea or vomiting. 12 tablet 0   pilocarpine (SALAGEN) 5 MG tablet Take 5 mg by mouth 3 (three) times daily.      pyridOXINE (B-6) 100 MG tablet Take 1 tablet (100 mg total) by mouth daily.     thiamine 100 MG tablet Take 1 tablet (100 mg total) by mouth daily.     valACYclovir (VALTREX) 500 MG tablet Take 500 mg by mouth 3 (three) times daily as needed (fever blister).     zolpidem (AMBIEN) 5 MG tablet Take 5 mg by mouth at bedtime.      No current facility-administered medications for this visit.    Allergies as of 04/04/2021 - Review Complete 04/04/2021  Allergen Reaction Noted   Buprenorphine hcl Nausea And Vomiting 07/19/2015   Doxycycline Nausea Only 09/02/2019   Morphine Nausea And Vomiting 07/19/2015   Morphine and related Nausea And Vomiting 06/24/2013   Ace inhibitors Cough 07/19/2015    Vitals: BP (!) 145/83   Pulse 80   Ht 5\' 3"  (1.6 m)   Wt 138 lb 3.2 oz (62.7 kg)   BMI 24.48 kg/m  Last Weight:  Wt Readings from Last 1 Encounters:  04/04/21 138 lb 3.2 oz (62.7 kg)   Last Height:   Ht Readings from Last 1 Encounters:  04/04/21 5\' 3"  (1.6 m)     Physical exam: Exam: Gen: NAD, conversant, anxious                    CV: RRR, no MRG. No Carotid Bruits. No peripheral edema, warm, nontender Eyes: Conjunctivae clear without exudates or hemorrhage  Neuro: Detailed Neurologic Exam  Speech:    Speech is normal; fluent and spontaneous with normal comprehension.  Cognition:    The patient is oriented to person, place, and time;     recent and remote memory intact;     language fluent;     normal attention, concentration,     fund of knowledge Cranial Nerves:    The pupils are equal, round, and reactive to light.  Visual fields are full to finger confrontation. Extraocular movements are intact. Trigeminal sensation is intact and the muscles of mastication are normal. The face is symmetric. The palate elevates in the  midline. Hearing intact. Voice is normal. Shoulder shrug is normal. The tongue has normal motion without fasciculations.   Coordination:    Normal finger to nose and heel to shin.  Gait:   Significant improvement, can heel/toe walk, normal stance and gait, tandem with mild imbalance (prior 08/2019: Imbalance, wide based; Imbalance with heel and toe almost falls with tandem.)   Motor Observation:    No asymmetry, no atrophy, and no involuntary movements noted. Tone:    Normal muscle tone.    Posture:    Posture is normal. normal erect    Strength: 5/5, significant improvement ( last 08/2019 generalized weakness)        Sensation: Decreased pp in the lower leg (can feel slightly more on the tops of the feet)  cannot feel temp until mid calf, vibration to the great toes 3 seconds, intact proprioception (significant improvement, 08/2019 was impaired to all modalities to the knees)     Reflex Exam:  DTR's: stable absent lower reflexes, biceps 2+ (stable since 08/2019)  Toes:    The toes are downgoing bilaterally.   Clonus:    Clonus is absent.  Assessment/Plan:  56 y.o. female here as requested by Karleen Hampshire., MD for gait abnormality.  Past medical history alcohol abuse, hot flashes, hypertension, bilateral carotid artery disease, mouth dryness, sicca syndrome, GERD, fatty liver, intractable vomiting with nausea, hyperlipidemia, elevated glucose, recurrent major depression, generalized anxiety disorder, edema, insomnia, Sjogren's, prediabetes.  She is a current every day smoker.Initially saw patient in July 2021 for acute onset paresthesias after COVID shot likely due to autoimmune mechanism/inflammatory given her history of rheumatologic disorders.  Was admitted inpatient and given IVIG with improvement. initial exam with absent reflexes,significant loss of sensation to the knees and wrists, wide based gait with imbalance. She has significantly improved on IVIG(see exam). However she has  changed insurance and missed her IVIG dose and  she is declining since not having the IVIG due to changes in insurance, we will try to get IVIG approved again.  Addendum 07/11/2021: Lynn Harvey, MRI of your brain is stable, no changes since 2021, I reviewed the imaging with the neuroradiologist as well. I do not believe this is Multiple Sclerosis. Also the fact that your brain has been stable without changes for 2 years also makes Lynn less likely. And your cervical spine was normal, no Lynn lesions were seen in the cervical spine. My recommendation would be to repeat MRI of the brain in a year. However I am more than happy to send you to an [place] specialist at [facility] if you want another opinion, thanks, Dr. Jaynee Eagles  - Patient's MRI had white matter changes, non specific but her sister was diagnosed with Lynn and patient is requesting evaluation which is reasonable. Will order mri brain and cspine to look for demyelinating disease of multiple sclerosis and follow white matter changes.  - - She has an extensive history of alcohol abuse which could be contributory she tried to stop but now she says she has started again. (She states her husband has Wernicke-Korsakoff and still drinks alcohol as well). Alcohol abuse can also be contributory to her sensory polyneuropathy.   - Need to get IVIG reapproved. She is feeling worse since not getting January IVIG, she has more numbness in the fingers and burning and in her feet and now moving up to the knees. Her balance is impaired.. Her exam does still show absent lower extremity reflexes and decrease to all sensory modalities even though she has improved since initiating IVIG.   - She sees Rheumatology, in [place], [place], I reviewed notes, she has sicca syndrome, arthralgia, Sjogren's, Doesn't appear she is not on an oral immunosuppressant and I discussed with patient that possibly we could talk to her rheumatologist and see if an oral immunosuppressant agent might  be better than getting IVIG every month at this point.  I would have to defer to rheumatology on what would be the best and immunosuppressant given her rheumatologic disorders.  In the meantime we can try to get the IVIG approved again(new insurance) until appointment in April with rheumatology. May consider transferring all her care to Rheumatology since this is an autoimmune disorder.     No orders of the defined types were placed in this encounter.  No orders of the defined types were placed in this encounter.   Cc: Karleen Hampshire., MD  Sarina Ill, MD  [facility] [address] [place], [place] [address]  Phone [phone] Fax [phone]  I spent over 40 minutes of face-to-face and non-face-to-face time with patient on the  1. Inflammatory polyneuropathy (  [place])   2. CIDP (chronic inflammatory demyelinating polyneuropathy) (H[facility])    diagnosis.  This included previsit chart review, lab review, study review, order entry, electronic health record documentation, patient education on the different diagnostic and therapeutic options, counseling and coordination of care, risks and benefits of management, compliance, or risk factor reduction

## 2021-04-04 NOTE — Patient Instructions (Signed)
We will get IVIG approved again, speak to Rheumatology in April

## 2021-04-04 NOTE — Telephone Encounter (Signed)
See other phone note.  BC/RN sent new ofv note to [facility]. (Fax confirmed received).

## 2021-04-04 NOTE — Telephone Encounter (Signed)
-----   Message from Anson Fret, MD sent at 04/04/2021  9:11 AM EST ----- Regarding: please continue with IVIG PA Not completed, please continue with trying to reinstate her IVIG since insurance change.

## 2021-04-04 NOTE — Telephone Encounter (Signed)
Per Dr [name], resubmit for IVIG, any brand we can get approved is ok. I contacted Porfirio Mylar w/ [facility] and let her know. Also faxed latest insurance card and office note from today. Received a receipt of confirmation. She states she will have her team submit auth with new diagnosis. Also stated it would not hurt to have an updated EMG/NCV.

## 2021-04-05 NOTE — Telephone Encounter (Signed)
From Dr [name]: "Please call patient and let her know that insurance may want an updated emg/ncs to get her IVIG approved. If she is willing, please order."  Update 04/05/2021 Lynn Harvey w/ [facility] is having her team look into whether or not a repeat EMG/NCS is needed. I can call patient as soon as I hear back from her.

## 2021-04-11 NOTE — Telephone Encounter (Signed)
The PA sheet for IVIG (Gammagard) has been completed and sent to [facility] with EMG and last office note for them to review, finish, and fax to plan. Received a receipt of confirmation. Also sent update to [facility] w/ [facility].

## 2021-04-13 ENCOUNTER — Encounter: Payer: Self-pay | Admitting: *Deleted

## 2021-04-13 NOTE — Telephone Encounter (Signed)
[employer] had a couple clarification questions they faxed [place]. These were answered and faxed back to [facility] @ [phone]. Received a receipt of confirmation.  Also updated Lynn Harvey w/ [facility].

## 2021-04-13 NOTE — Telephone Encounter (Signed)
Mailed copy of letter to transition care back to rheumatology to pt. Also received fax confirmation that Dr. Cardell Peach [phone], [phone].

## 2021-04-17 NOTE — Telephone Encounter (Signed)
[employer] asked additional clinical information (this was already faxed). I called and provided this to [facility] in the "missing clinical" dept of [employer]. She will send to pharmacy for processing.

## 2021-04-18 NOTE — Telephone Encounter (Signed)
Received fax from [facility]. Gammagard 20 gm/200 mL approved from 04/06/2021-04/05/2022. I faxed this letter to [facility]. Received a receipt of confirmation.

## 2021-04-26 ENCOUNTER — Ambulatory Visit: Payer: Self-pay | Admitting: Neurology

## 2021-05-08 ENCOUNTER — Other Ambulatory Visit: Payer: Self-pay | Admitting: Adult Health

## 2021-05-12 ENCOUNTER — Encounter: Payer: Self-pay | Admitting: Neurology

## 2021-05-12 ENCOUNTER — Telehealth: Payer: Self-pay

## 2021-05-12 ENCOUNTER — Telehealth: Payer: Self-pay | Admitting: Physician Assistant

## 2021-05-12 NOTE — Telephone Encounter (Signed)
Per [name]: Lynn Harvey. This patient called in about having another lab and iron infusion. Patient states her symptoms are getting worse. She's feeling tired and craving ice. She is asking to get an appointment soon to get this checked out. Thankssss ? ?Spoke with pt and she has an appt with her PCP on Tuesday 3/28 and will be having labwork.  Pt will call if labs are abnormal and needs a follow up with hematology ?

## 2021-05-12 NOTE — Telephone Encounter (Signed)
Scheduled per 3/24 secure chat, message has been left with pt ?

## 2021-05-15 ENCOUNTER — Encounter: Payer: Self-pay | Admitting: *Deleted

## 2021-05-16 ENCOUNTER — Other Ambulatory Visit: Payer: BC Managed Care – PPO

## 2021-05-16 ENCOUNTER — Ambulatory Visit: Payer: BC Managed Care – PPO | Admitting: Physician Assistant

## 2021-06-20 ENCOUNTER — Telehealth: Payer: Self-pay | Admitting: Neurology

## 2021-06-20 NOTE — Telephone Encounter (Signed)
Pt said following up on message sent through MyChart. Rheumatologist cannot take over treatment, would like to get MRI as soon as possible. Would like a call from the nurse. ?

## 2021-06-20 NOTE — Telephone Encounter (Signed)
Previous note from Dr [name] said she needed to discuss with [facility] NP. We may end up needing an appt first.  ?

## 2021-06-21 ENCOUNTER — Telehealth: Payer: Self-pay | Admitting: Neurology

## 2021-06-21 NOTE — Telephone Encounter (Signed)
Addressed in other phone note.

## 2021-06-21 NOTE — Telephone Encounter (Signed)
I called pt started off with telling her IVIG was not approved.  Looking thru chart I see it was approved Received fax from [facility]. Gammagard 20 gm/200 mL approved from 04/06/2021-04/05/2022. I faxed this letter to [facility]. Noted 07-16-2021. So she is getting as of now.  PT DOES NOT WANT TO STOP THIS.  Also MRI last done by [place] 2021, by [facility]  2018 not [place]. Per Raquel Sarna ok to order MRI and see if get approved.  Per pt, rheumatology will only treat rheumatological conditions , and she is seeing one,  not neurology conditions.  I spoke to Dr. Jaynee Eagles and that is great she was not aware that IVIG had been approved.  She was ok to see if MRI brain and cervical would be approved without appt first, and compare to MRI done from 2021, but if needs appt prior to ordering then will do so.   She ? ?

## 2021-06-21 NOTE — Telephone Encounter (Signed)
Pod4: We cannot get her IVIG infusions approved so we will have to see how she does off of it. I cannot treat her rheumatologic conditions like her sicca syndrome she will have to continue to see rheumatology for that, we can follow for her neuropathy and also discuss any other possible causes such as  alcohol use as a risk factor. But as far as the MRI brain and cervical spine, she will need another appointment to get that approved. Also ask her to call [place] [facility] and get a CD mailed to her of her prior MRIs and bring it with her. Please set up a follow up with myself or megan, first available to get an office visit in order to get MRIs ordered, insurance usually requires an office visit and justification to get MRIs approved. I discussed this with megan. Please schedule at least a month out to give patient time to get [place] the CD thanks ?

## 2021-06-21 NOTE — Telephone Encounter (Signed)
I called pt and relayed the below.  Will continue IVIG, Dr. Jaynee Eagles now aware was approved.  MRI brain and cervical to be ordered.  If for some reason insurance will not approve and pt will need appt then will need to do so to order the MRI.  Pt also made aware we will follow her for neuro problems.  She verbalized appreciation and understanding.  ?

## 2021-06-22 ENCOUNTER — Encounter: Payer: Self-pay | Admitting: Neurology

## 2021-06-26 NOTE — Telephone Encounter (Signed)
MRIs were approved. I lvm for patient to call us back to get scheduled. ?

## 2021-06-27 ENCOUNTER — Other Ambulatory Visit: Payer: Self-pay | Admitting: Neurology

## 2021-06-27 MED ORDER — ALPRAZOLAM 0.25 MG PO TABS: ORAL_TABLET | ORAL | 0 refills | Status: DC

## 2021-06-27 NOTE — Telephone Encounter (Signed)
Scheduled MRI at [facility] for 07/04/21 at 8:30am. ?Patient would like anxiety meds for scan please ? ? ?90 mins MRI brain w/wo & MRI cervical w/wo Dr. Valaria Good Berkley Harvey: [phone] exp. 06/26/21-07/25/21 ?

## 2021-06-29 ENCOUNTER — Encounter: Payer: Self-pay | Admitting: Neurology

## 2021-06-29 MED ORDER — GAMMAGARD 20 GM/200ML IJ SOLN: INTRAMUSCULAR | Status: AC

## 2021-06-29 NOTE — Telephone Encounter (Signed)
Printed accomodation p/w for pt having infusions at home.  For Dr. [name] to review. ?

## 2021-06-30 ENCOUNTER — Encounter: Payer: Self-pay | Admitting: *Deleted

## 2021-07-02 ENCOUNTER — Other Ambulatory Visit: Payer: Self-pay | Admitting: Neurology

## 2021-07-04 ENCOUNTER — Ambulatory Visit (INDEPENDENT_AMBULATORY_CARE_PROVIDER_SITE_OTHER): Payer: BC Managed Care – PPO

## 2021-07-04 DIAGNOSIS — R202 Paresthesia of skin: Secondary | ICD-10-CM | POA: Diagnosis not present

## 2021-07-04 DIAGNOSIS — R9082 White matter disease, unspecified: Secondary | ICD-10-CM | POA: Diagnosis not present

## 2021-07-04 DIAGNOSIS — R200 Anesthesia of skin: Secondary | ICD-10-CM

## 2021-07-04 DIAGNOSIS — Z820 Family history of epilepsy and other diseases of the nervous system: Secondary | ICD-10-CM | POA: Diagnosis not present

## 2021-07-04 MED ORDER — GADOBENATE DIMEGLUMINE 529 MG/ML IV SOLN
10.0000 mL | Freq: Once | INTRAVENOUS | Status: AC | PRN
  Administered 2021-07-04: 10 mL via INTRAVENOUS

## 2021-07-10 ENCOUNTER — Encounter: Payer: Self-pay | Admitting: Neurology

## 2021-07-24 ENCOUNTER — Other Ambulatory Visit: Payer: Self-pay | Admitting: Neurology

## 2021-07-24 DIAGNOSIS — R9082 White matter disease, unspecified: Secondary | ICD-10-CM

## 2021-07-28 ENCOUNTER — Other Ambulatory Visit: Payer: BC Managed Care – PPO

## 2021-08-02 ENCOUNTER — Telehealth: Payer: Self-pay | Admitting: Neurology

## 2021-08-02 NOTE — Telephone Encounter (Signed)
Referral for Neurology sent to [facility] [phone]. 

## 2021-09-04 ENCOUNTER — Other Ambulatory Visit: Payer: Self-pay | Admitting: Neurology

## 2021-09-07 ENCOUNTER — Telehealth: Payer: Self-pay | Admitting: *Deleted

## 2021-09-07 NOTE — Telephone Encounter (Signed)
Received orders to be signed for renewing IVIG for pt.  Last seen 06-13-21, next appt with MM/NP 10-02-2021.

## 2021-09-12 NOTE — Telephone Encounter (Signed)
Orders signed by Dr [name] then faxed to [facility]. Received a receipt of confirmation.

## 2021-10-02 ENCOUNTER — Encounter: Payer: Self-pay | Admitting: Adult Health

## 2021-10-02 ENCOUNTER — Ambulatory Visit (INDEPENDENT_AMBULATORY_CARE_PROVIDER_SITE_OTHER): Payer: BC Managed Care – PPO | Admitting: Adult Health

## 2021-10-02 VITALS — BP 136/77 | HR 79 | Ht 63.0 in | Wt 136.4 lb

## 2021-10-02 DIAGNOSIS — G619 Inflammatory polyneuropathy, unspecified: Secondary | ICD-10-CM | POA: Diagnosis not present

## 2021-10-02 DIAGNOSIS — R9082 White matter disease, unspecified: Secondary | ICD-10-CM | POA: Diagnosis not present

## 2021-10-02 NOTE — Progress Notes (Signed)
PATIENT: Lynn Harvey DOB: November 02, 1965  REASON FOR VISIT: follow up HISTORY FROM: patient  Chief Complaint  Patient presents with   Follow-up    Pt in 9  pt here for neuropathy pain f/u  Pt states  still have some pain but infusions and gabapentin makes pain tolerable     HISTORY OF PRESENT ILLNESS: Today 10/02/21: Mr. Backs is a 56 year old female with a history of inflammatory neuropathy.  She returns today for follow-up. Last IVIG infusion was 4 weeks ago. Will get next infusion Thursday and Friday. Reports still has numbness and tingling in finger tips. Reports that legs feel weak. Had  a fall two weekends ago. No injuries.Does not use assistive device. No PT. This is the first fall in a couple of years. Reports that she does drink ETOH typically on the weekends. Usually drinks white claws- 4 each day (Saturday or Sunday).  Still has some concerns about Lynn.  Does not want to do any additional work-up until she sees another neurologist.  Will be seeing a neurologist at [facility] in October  Addendum 07/11/2021: Lynn Harvey, MRI of your brain is stable, no changes since 2021, I reviewed the imaging with the neuroradiologist as well. I do not believe this is Multiple Sclerosis. Also the fact that your brain has been stable without changes for 2 years also makes Lynn less likely. And your cervical spine was normal, no Lynn lesions were seen in the cervical spine. My recommendation would be to repeat MRI of the brain in a year. However I am more than happy to send you to an [place] specialist at [facility] if you want another opinion, thanks, Dr. Jaynee Eagles   04/04/2021: Initially saw patient in July 2021 for acute onset paresthesias after COVID shot likely due to autoimmune mechanism/inflammatory given her history of rheumatologic disorders.  Was admitted inpatient and given IVIG with improvement. Past medical history hot flashes, hypertension, bilateral carotid artery disease, mouth dryness, sicca  syndrome, GERD, fatty liver, intractable vomiting with nausea, hyperlipidemia, elevated glucose, recurrent major depression, generalized anxiety disorder, edema, insomnia, Sjogren's, prediabetes.  She was last seen by her nurse practitioner in March 2022 where she felt stable/improved on IVIG and on gabapentin. She sees Rheumatology, in [place], [place], I reviewed notes, she has sicca syndrome, arthralgia, Sjogren's, she is not on an immunosuppressant and I discussed with patient that possibly we could talk to her rheumatologist and see if an immunosuppressant agent might be better than getting IVIG every month at this point.  I would have to defer to rheumatology on what would be the best and immunosuppressant given her rheumatologic disorders.  In the meantime we can continue the IVIG and the gabapentin but may consider transferring all her care since this is an autoimmune disorder. She is feeling worse since not getting January IVIG, she has more numbness in the fingers and burning and in her feet and now moving up to the knees. Her balance is impaired.  Lynn Harvey is a 57 year old female with a history of inflammatory neuropathy.  She returns today for follow-up.  She remains on gabapentin 400 mg 3 times a day.  She reports that her balance has improved.  She feels it is back to her baseline.  She continues to get numbness and sharp shooting pain in the hands and legs.  She does feel that gabapentin has offered her improvement.  She states that if she misses the gabapentin dose then the pain  returns.  She continues on IVIG.  She follows with rheumatology.  She is not drink alcohol since September.  HISTORY CC: "Numbness, tingling, pain everywhere, feet hands at the worst, sharp lightning pains, all started right after 2nd Covid shot "   HPI:  Lynn Harvey is a 56 y.o. female here as requested by Karleen Hampshire., MD for gait abnormality.  Past medical history hot flashes, hypertension, bilateral  carotid artery disease, mouth dryness, sicca syndrome, GERD, fatty liver, intractable vomiting with nausea, hyperlipidemia, elevated glucose, recurrent major depression, generalized anxiety disorder, edema, insomnia, Sjogren's, prediabetes.  She has an allergy to buprenorphine, morphine, ACE inhibitors and doxycycline.  I reviewed Dr. Dian Situ notes that were provided which had no history absolutely of any symptoms in them.  She is a current every day smoker.   Here with her husband who also provides much information. June 23rd she had her second covid shot and around 6-7pm that night her face became numb like sh ehad a shot of novacaine, the back of her head, then all over her body. She got up and she had no "maneuverability", started in the head and immediately went all the way to the feet. She went to the emergency roo, she was vomiting. They kept jer overnight, the next morning could walk and they let her go home and she wasn't throwing up anymore, she still had symptoms afterwards but 5 days later had an acute episode again. She was in triage for 6 hours and by then she could walk again. The symptoms have not resolved, continuously worse, hands and deet are on fire, she can;t open jars. Her legs hurt at bedtime, she has lightning and shppting pain in the legs and the hands. The whole body is affected, husband says she will wake up crying.Marland Kitchen Her bowel movements are bad. She is also drinking daily, she is due for an abdominal ultrasound this month, she has a long history of alcohol use a 1/2 gallon every 2 days, but not as much lately she drinks beer and white claw. The pain is getting worse, She was given gabapentin for the nerve pain but it knocks her out.    Reviewed notes, labs and imaging from outside physicians, which showed:   MRI cervical 12/2013-spine personally reviewed images and agree:   IMPRESSION: 1. Rightward disc herniations at C5-C6 and C6-C7, more pronounced at the former. Mild C5-C6  spinal stenosis and moderate right C6 foraminal stenosis. Mild right C7 foraminal stenosis. 2. Moderate to severe left facet degeneration at C3-C4. Associated moderate left C4 foraminal stenosis.      REVIEW OF SYSTEMS: Out of a complete 14 system review of symptoms, the patient complains only of the following symptoms, and all other reviewed systems are negative.  See hpi  ALLERGIES: Allergies  Allergen Reactions   Buprenorphine Hcl Nausea And Vomiting   Doxycycline Nausea Only   Morphine Nausea And Vomiting   Morphine And Related Nausea And Vomiting   Ace Inhibitors Cough    HOME MEDICATIONS: Outpatient Medications Prior to Visit  Medication Sig Dispense Refill   acetaminophen-codeine (TYLENOL #3) 300-30 MG tablet Take 1 tablet by mouth every 8 (eight) hours as needed for moderate pain.      ALPRAZolam (XANAX) 0.25 MG tablet Take 1-2 tabs (0.25mg -0.50mg ) 30-60 minutes before procedure. May repeat if needed.Do not drive. 4 tablet 0   amLODipine (NORVASC) 5 MG tablet TAKE 1 TABLET BY MOUTH EVERY DAY 90 tablet 3   amphetamine-dextroamphetamine (ADDERALL) 15  MG tablet Take 0.5 tablets by mouth 2 (two) times daily. (Patient taking differently: Take 15 mg by mouth 2 (two) times daily.) 60 tablet 0   aspirin EC 81 MG tablet Take 81 mg by mouth daily.     ezetimibe (ZETIA) 10 MG tablet TAKE 1 TABLET(10 MG) BY MOUTH DAILY 90 tablet 3   folic acid (FOLVITE) 1 MG tablet Take 1 tablet (1 mg total) by mouth daily.     gabapentin (NEURONTIN) 400 MG capsule TAKE 1 CAPSULE(400 MG) BY MOUTH THREE TIMES DAILY 90 capsule 1   Immune Globulin, Human, (GAMMAGARD) 20 GM/200ML SOLN Infused thru [facility]     metoprolol tartrate (LOPRESSOR) 25 MG tablet Take 1 tablet (25 mg total) by mouth 2 (two) times daily. 60 tablet 2   Multiple Vitamin (MULTIVITAMIN WITH MINERALS) TABS tablet Take 1 tablet by mouth daily. 30 tablet 0   nicotine (NICODERM CQ - DOSED IN MG/24 HOURS) 21 mg/24hr patch Place 1 patch (21  mg total) onto the skin daily. 28 patch 0   nitroGLYCERIN (NITROSTAT) 0.4 MG SL tablet DISSOLVE 1 TABLET UNDER THE TONGUE EVERY 5 MINUTES AS NEEDED FOR CHEST PAIN 45 tablet 1   omeprazole (PRILOSEC) 20 MG capsule Take 20 mg by mouth 2 (two) times daily before a meal.     ondansetron (ZOFRAN) 4 MG tablet Take 1 tablet (4 mg total) by mouth every 8 (eight) hours as needed for nausea or vomiting. 12 tablet 0   pilocarpine (SALAGEN) 5 MG tablet Take 5 mg by mouth 3 (three) times daily.      pyridOXINE (B-6) 100 MG tablet Take 1 tablet (100 mg total) by mouth daily.     thiamine 100 MG tablet Take 1 tablet (100 mg total) by mouth daily.     valACYclovir (VALTREX) 500 MG tablet Take 500 mg by mouth 3 (three) times daily as needed (fever blister).     zolpidem (AMBIEN) 5 MG tablet Take 5 mg by mouth at bedtime.      No facility-administered medications prior to visit.    PAST MEDICAL HISTORY: Past Medical History:  Diagnosis Date   Acute sensory neuropathy 09/18/2019   ADD (attention deficit disorder)    Allergy    Anemia    Anxiety    Coronary artery disease    a. s/p NSTEMI in 2015 with angioplasty alone to mid-LAD, repeat cath within the same month showing a patent site   Depression    Depression    Fatty liver    GAD (generalized anxiety disorder)    GERD (gastroesophageal reflux disease)    Gestational diabetes    History of heart attack    Hyperlipidemia    Hypertension    Insomnia    Lupus (HCC)    Milia    Myocardial infarction (HCC) 06/2013   Rectal abnormality    Sicca syndrome (HCC)    Sjogren's syndrome (HCC)    Telangiectasia    Vitamin D deficiency     PAST SURGICAL HISTORY: Past Surgical History:  Procedure Laterality Date   ABDOMINAL HYSTERECTOMY     fibroids, endometrosis.    CHOLECYSTECTOMY  08/2018   COLON SURGERY     Flexible Sigmoidoscopy   COLONOSCOPY N/A 01/08/2014   Procedure: COLONOSCOPY;  Surgeon: Theda Belfast, MD;  Location: [facility];   Service: Endoscopy;  Laterality: N/A;   ESOPHAGOGASTRODUODENOSCOPY N/A 01/08/2014   Procedure: ESOPHAGOGASTRODUODENOSCOPY (EGD);  Surgeon: Theda Belfast, MD;  Location: [facility];  Service: Endoscopy;  Laterality: N/A;   LEFT HEART CATHETERIZATION WITH CORONARY ANGIOGRAM N/A 06/24/2013   Procedure: LEFT HEART CATHETERIZATION WITH CORONARY ANGIOGRAM;  Surgeon: Troy Sine, MD;  Location: [facility] CATH LAB;  Service: Cardiovascular;  Laterality: N/A;   LEFT HEART CATHETERIZATION WITH CORONARY ANGIOGRAM N/A 07/17/2013   Procedure: LEFT HEART CATHETERIZATION WITH CORONARY ANGIOGRAM;  Surgeon: Burnell Blanks, MD;  Location: [facility] CATH LAB;  Service: Cardiovascular;  Laterality: N/A;   TONSILECTOMY, ADENOIDECTOMY, BILATERAL MYRINGOTOMY AND TUBES     WRIST SURGERY  1987   RIGHT; bone from forearm grafted to wrist    FAMILY HISTORY: Family History  Problem Relation Age of Onset   COPD Mother        emphysema   Lung cancer Mother    Heart disease Father 41       s/p 3V CABG   Depression Father    Barrett's esophagus Father    Mental illness Sister        depression   Depression Sister    Multiple sclerosis Sister    Lung cancer Maternal Aunt    Diabetes Maternal Grandmother    Heart disease Maternal Grandmother    Diabetes Maternal Grandfather    Cancer Maternal Grandfather        lung cancer   Diabetes Paternal Grandmother    Kidney disease Paternal Grandmother    Diabetes Paternal Grandfather    Heart disease Paternal Grandfather    Mental illness Son        ADHD, bipolar disorder   Neuropathy Neg Hx     SOCIAL HISTORY: Social History   Socioeconomic History   Marital status: Married    Spouse name: Barbaraann Rondo   Number of children: 3   Years of education: 12   Highest education level: Not on file  Occupational History   Occupation: CUSTOMER SERVICE    Employer: [employer]  Tobacco Use   Smoking status: Every Day    Packs/day: 1.00    Years: 20.00    Total  pack years: 20.00    Types: Cigarettes    Start date: 12/25/1982   Smokeless tobacco: Never   Tobacco comments:    09/03/19 < 1.5 ppd  Vaping Use   Vaping Use: Never used  Substance and Sexual Activity   Alcohol use: Not Currently    Comment: Drank 24 cans/week until September 2021   Drug use: No   Sexual activity: Yes    Partners: Male    Birth control/protection: Surgical  Other Topics Concern   Not on file  Social History Narrative   Lives with her husband, their son Yong Channel, and her daughter Museum/gallery conservator and Amber's son [name].   Social Determinants of Health   Financial Resource Strain: Not on file  Food Insecurity: Not on file  Transportation Needs: Not on file  Physical Activity: Not on file  Stress: Not on file  Social Connections: Not on file  Intimate Partner Violence: Not on file      PHYSICAL EXAM  Vitals:   10/02/21 0802  BP: 136/77  Pulse: 79  Weight: 136 lb 6.4 oz (61.9 kg)  Height: 5\' 3"  (1.6 m)   Body mass index is 24.16 kg/m.  Generalized: Well developed, in no acute distress   Neurological examination  Mentation: Alert oriented to time, place, history taking. Follows all commands speech and language fluent Cranial nerve II-XII: Pupils were equal round reactive to light. Extraocular movements were full, visual field were full on confrontational test. Head  turning and shoulder shrug  were normal and symmetric. Motor: The motor testing reveals 5 over 5 strength of all 4 extremities. Good symmetric motor tone is noted throughout.  Sensory: Sensory testing is intact to soft touch on all 4 extremities. No evidence of extinction is noted.  Coordination: Cerebellar testing reveals good finger-nose-finger and heel-to-shin bilaterally.  Gait and station: Gait is normal.  Has a boot on the left foot Reflexes: Deep tendon reflexes are symmetric and normal bilaterally.   DIAGNOSTIC DATA (LABS, IMAGING, TESTING) - I reviewed patient records, labs, notes, testing and  imaging myself where available.  Lab Results  Component Value Date   WBC 7.8 11/30/2020   HGB 15.0 11/30/2020   HCT 43.3 11/30/2020   MCV 95.6 11/30/2020   PLT 184 11/30/2020      Component Value Date/Time   NA 141 08/31/2020 0814   K 4.9 08/31/2020 0814   CL 104 08/31/2020 0814   CO2 28 08/31/2020 0814   GLUCOSE 107 (H) 08/31/2020 0814   BUN 12 08/31/2020 0814   CREATININE 0.87 08/31/2020 0814   CREATININE 0.77 07/29/2013 0847   CALCIUM 10.3 08/31/2020 0814   PROT 8.0 08/31/2020 0814   PROT 6.3 09/03/2019 1451   ALBUMIN 3.7 08/31/2020 0814   AST 39 08/31/2020 0814   ALT 38 08/31/2020 0814   ALKPHOS 98 08/31/2020 0814   BILITOT 0.4 08/31/2020 0814   GFRNONAA >60 08/31/2020 0814   GFRAA >60 11/19/2019 1211   Lab Results  Component Value Date   CHOL 194 09/18/2019   HDL 32 (L) 09/18/2019   LDLCALC 121 (H) 09/18/2019   TRIG 206 (H) 09/18/2019   CHOLHDL 6.1 09/18/2019   Lab Results  Component Value Date   HGBA1C 5.3 09/03/2019   Lab Results  Component Value Date   VITAMINB12 459 11/19/2019   Lab Results  Component Value Date   TSH 1.710 09/03/2019      ASSESSMENT AND PLAN 56 y.o. year old female  has a past medical history of Acute sensory neuropathy (09/18/2019), ADD (attention deficit disorder), Allergy, Anemia, Anxiety, Coronary artery disease, Depression, Depression, Fatty liver, GAD (generalized anxiety disorder), GERD (gastroesophageal reflux disease), Gestational diabetes, History of heart attack, Hyperlipidemia, Hypertension, Insomnia, Lupus (HCC), Milia, Myocardial infarction (HCC) (06/2013), Rectal abnormality, Sicca syndrome (HCC), Sjogren's syndrome (HCC), Telangiectasia, and Vitamin D deficiency. here with:  1.  Inflammatory neuropathy  --Continue IVIG --Continue gabapentin 400 mg 3 times a day --Does not wish to do any further work-up or treatment until she sees neurologist at [facility]. --Advised if symptoms worsen or she develops new symptoms she  should let us know --Follow-up in 6 months    Butch Penny, MSN, NP-C 10/02/2021, 8:40 AM [facility] [address], [address] [place], [place] [address] [phone]

## 2021-10-02 NOTE — Patient Instructions (Addendum)
Your Plan:  Continue IVIG and gabapentin   Thank you for coming to see [place] at [facility]. I hope we have been able to provide you high quality care today.  You may receive a patient satisfaction survey over the next few weeks. We would appreciate your feedback and comments so that we may continue to improve ourselves and the health of our patients.

## 2021-10-04 ENCOUNTER — Other Ambulatory Visit: Payer: Self-pay | Admitting: Cardiovascular Disease

## 2021-10-09 ENCOUNTER — Encounter: Payer: Self-pay | Admitting: Physician Assistant

## 2021-10-10 IMAGING — XA DG FLUORO GUIDE SPINAL/SI JT INJ*R*
1 series · 1 of 1 positions shown · non-contrast
Comparison: none

CLINICAL DATA: Ataxia and paresthesia after vaccination. Evaluation
for Guillain-Barre syndrome.

[Series 2: ortho adipose · 1 of 1 slices shown]
[im 1/1]
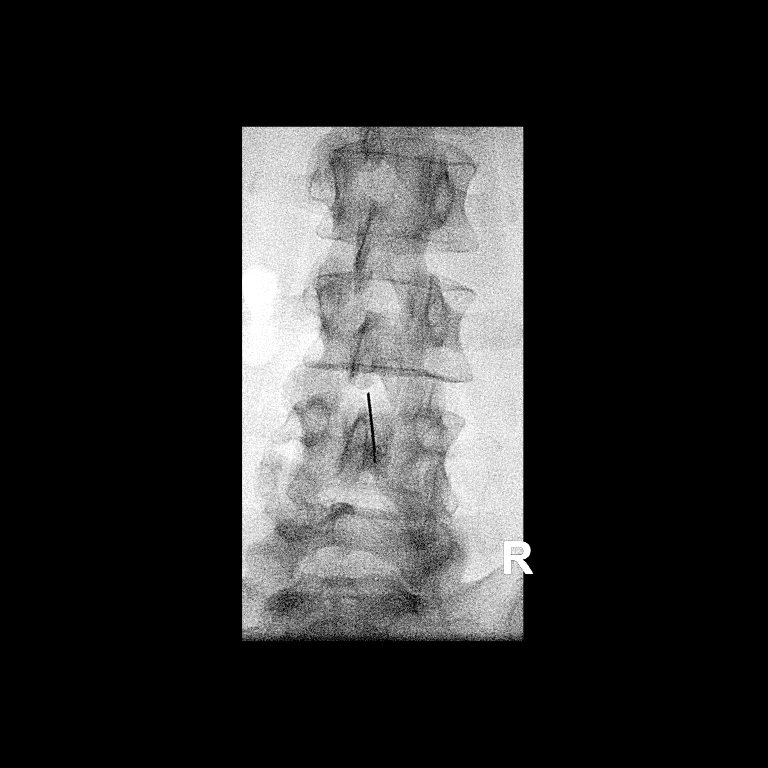

[1 of 1 positions shown; findings below may reference images not displayed]

EXAM:
DIAGNOSTIC LUMBAR PUNCTURE UNDER FLUOROSCOPIC GUIDANCE

FLUOROSCOPY TIME:  Fluoroscopy Time: 0 minutes 17 seconds.
micro gray meter squared

PROCEDURE:
Informed consent was obtained from the patient prior to the
procedure, including potential complications of headache, allergy,
and pain. With the patient prone, the lower back was prepped with
Betadine. 1% Lidocaine was used for local anesthesia. Lumbar
puncture was performed at the right L3-4 level using a 20 gauge
needle with return of clear CSF with an opening pressure of 14 cm
water. Five ml of CSF were obtained for laboratory studies. The
patient tolerated the procedure well and there were no apparent
complications.
IMPRESSION: Lumbar puncture on the right at L3-4.

## 2021-11-02 ENCOUNTER — Encounter: Payer: Self-pay | Admitting: Physician Assistant

## 2021-11-03 ENCOUNTER — Encounter: Payer: Self-pay | Admitting: Physician Assistant

## 2021-11-13 ENCOUNTER — Telehealth: Payer: Self-pay | Admitting: Adult Health

## 2021-11-13 NOTE — Telephone Encounter (Signed)
Lynn Harvey a Software engineer from [employer] called stating that they provide IVIG home infusion and they infuse 55 grams either in one day or divide it in two days. Pt chose to divide between two days. Pt was infused on Friday and Saturday she received news that her father passed away so r/s for 2022/06/09. Lynn Harvey just wanted to inform the provider that this happened. If there are any questions please call him back [phone].

## 2021-11-23 ENCOUNTER — Other Ambulatory Visit: Payer: Self-pay | Admitting: Neurology

## 2021-12-23 ENCOUNTER — Other Ambulatory Visit: Payer: Self-pay | Admitting: Cardiovascular Disease

## 2022-01-01 ENCOUNTER — Encounter: Payer: Self-pay | Admitting: Physician Assistant

## 2022-01-09 ENCOUNTER — Encounter: Payer: Self-pay | Admitting: Physician Assistant

## 2022-01-22 ENCOUNTER — Other Ambulatory Visit: Payer: Self-pay | Admitting: Neurology

## 2022-02-06 ENCOUNTER — Telehealth: Payer: Self-pay | Admitting: Neurology

## 2022-02-06 NOTE — Telephone Encounter (Signed)
LVM advising pt of below information, cancelled appt February appt.

## 2022-02-06 NOTE — Telephone Encounter (Signed)
Patient was seen 12/6 at [facility]. She was referred to their neuromuscular clinic for evaluation of her inflammatory neuropathy so I would like to cancel her appointment in February until she sees the specialist at [facility] we may transfer care to the academic center after she sees them depending what they say, so would like to wait and get their opinion. Pleae cancel abd ask her to make the appointment with the neuromuscular doctor at [facility] who is a subspecialist thanks

## 2022-03-01 ENCOUNTER — Emergency Department (HOSPITAL_COMMUNITY): Payer: BC Managed Care – PPO

## 2022-03-01 ENCOUNTER — Inpatient Hospital Stay (HOSPITAL_COMMUNITY)
Admission: EM | Admit: 2022-03-01 | Discharge: 2022-03-05 | DRG: 641 | Disposition: A | Discharge status: Home / Self Care | Payer: BC Managed Care – PPO | Attending: Internal Medicine | Admitting: Internal Medicine

## 2022-03-01 DIAGNOSIS — Z794 Long term (current) use of insulin: Secondary | ICD-10-CM

## 2022-03-01 DIAGNOSIS — I2510 Atherosclerotic heart disease of native coronary artery without angina pectoris: Secondary | ICD-10-CM | POA: Diagnosis present

## 2022-03-01 DIAGNOSIS — E871 Hypo-osmolality and hyponatremia: Secondary | ICD-10-CM | POA: Diagnosis present

## 2022-03-01 DIAGNOSIS — Z720 Tobacco use: Secondary | ICD-10-CM | POA: Diagnosis present

## 2022-03-01 DIAGNOSIS — E860 Dehydration: Secondary | ICD-10-CM | POA: Diagnosis present

## 2022-03-01 DIAGNOSIS — K2900 Acute gastritis without bleeding: Secondary | ICD-10-CM | POA: Diagnosis present

## 2022-03-01 DIAGNOSIS — D689 Coagulation defect, unspecified: Secondary | ICD-10-CM | POA: Diagnosis present

## 2022-03-01 DIAGNOSIS — Z8616 Personal history of COVID-19: Secondary | ICD-10-CM

## 2022-03-01 DIAGNOSIS — Z8249 Family history of ischemic heart disease and other diseases of the circulatory system: Secondary | ICD-10-CM | POA: Diagnosis not present

## 2022-03-01 DIAGNOSIS — Z825 Family history of asthma and other chronic lower respiratory diseases: Secondary | ICD-10-CM

## 2022-03-01 DIAGNOSIS — I252 Old myocardial infarction: Secondary | ICD-10-CM

## 2022-03-01 DIAGNOSIS — Z833 Family history of diabetes mellitus: Secondary | ICD-10-CM

## 2022-03-01 DIAGNOSIS — Z90710 Acquired absence of both cervix and uterus: Secondary | ICD-10-CM

## 2022-03-01 DIAGNOSIS — M3500 Sicca syndrome, unspecified: Secondary | ICD-10-CM | POA: Diagnosis present

## 2022-03-01 DIAGNOSIS — K7011 Alcoholic hepatitis with ascites: Secondary | ICD-10-CM | POA: Diagnosis present

## 2022-03-01 DIAGNOSIS — F1010 Alcohol abuse, uncomplicated: Secondary | ICD-10-CM | POA: Diagnosis present

## 2022-03-01 DIAGNOSIS — I10 Essential (primary) hypertension: Secondary | ICD-10-CM | POA: Diagnosis present

## 2022-03-01 DIAGNOSIS — E785 Hyperlipidemia, unspecified: Secondary | ICD-10-CM | POA: Diagnosis present

## 2022-03-01 DIAGNOSIS — K7010 Alcoholic hepatitis without ascites: Secondary | ICD-10-CM | POA: Diagnosis present

## 2022-03-01 DIAGNOSIS — E8809 Other disorders of plasma-protein metabolism, not elsewhere classified: Secondary | ICD-10-CM | POA: Diagnosis present

## 2022-03-01 DIAGNOSIS — F411 Generalized anxiety disorder: Secondary | ICD-10-CM | POA: Diagnosis present

## 2022-03-01 DIAGNOSIS — Z820 Family history of epilepsy and other diseases of the nervous system: Secondary | ICD-10-CM

## 2022-03-01 DIAGNOSIS — K7031 Alcoholic cirrhosis of liver with ascites: Secondary | ICD-10-CM | POA: Diagnosis present

## 2022-03-01 DIAGNOSIS — E559 Vitamin D deficiency, unspecified: Secondary | ICD-10-CM | POA: Diagnosis present

## 2022-03-01 DIAGNOSIS — F32A Depression, unspecified: Secondary | ICD-10-CM | POA: Diagnosis present

## 2022-03-01 DIAGNOSIS — Z818 Family history of other mental and behavioral disorders: Secondary | ICD-10-CM

## 2022-03-01 DIAGNOSIS — Z9049 Acquired absence of other specified parts of digestive tract: Secondary | ICD-10-CM | POA: Diagnosis not present

## 2022-03-01 DIAGNOSIS — E876 Hypokalemia: Secondary | ICD-10-CM | POA: Diagnosis present

## 2022-03-01 DIAGNOSIS — F17210 Nicotine dependence, cigarettes, uncomplicated: Secondary | ICD-10-CM | POA: Diagnosis present

## 2022-03-01 DIAGNOSIS — Z801 Family history of malignant neoplasm of trachea, bronchus and lung: Secondary | ICD-10-CM

## 2022-03-01 DIAGNOSIS — K760 Fatty (change of) liver, not elsewhere classified: Secondary | ICD-10-CM | POA: Diagnosis present

## 2022-03-01 DIAGNOSIS — Z79899 Other long term (current) drug therapy: Secondary | ICD-10-CM

## 2022-03-01 DIAGNOSIS — E1142 Type 2 diabetes mellitus with diabetic polyneuropathy: Secondary | ICD-10-CM | POA: Diagnosis present

## 2022-03-01 DIAGNOSIS — F1020 Alcohol dependence, uncomplicated: Secondary | ICD-10-CM | POA: Diagnosis present

## 2022-03-01 DIAGNOSIS — E440 Moderate protein-calorie malnutrition: Secondary | ICD-10-CM | POA: Diagnosis present

## 2022-03-01 DIAGNOSIS — K219 Gastro-esophageal reflux disease without esophagitis: Secondary | ICD-10-CM | POA: Diagnosis not present

## 2022-03-01 DIAGNOSIS — Z7982 Long term (current) use of aspirin: Secondary | ICD-10-CM

## 2022-03-01 LAB — COMPREHENSIVE METABOLIC PANEL
ALT: 20 U/L (ref 0–44)
AST: 52 U/L — ABNORMAL HIGH (ref 15–41)
Albumin: 2.9 g/dL — ABNORMAL LOW (ref 3.5–5.0)
Alkaline Phosphatase: 121 U/L (ref 38–126)
Anion gap: 13 (ref 5–15)
BUN: <5 mg/dL — ABNORMAL LOW (ref 6–20)
CO2: 34 mmol/L — ABNORMAL HIGH (ref 22–32)
Calcium: 8.3 mg/dL — ABNORMAL LOW (ref 8.9–10.3)
Chloride: 87 mmol/L — ABNORMAL LOW (ref 98–111)
Creatinine, Ser: 0.61 mg/dL (ref 0.44–1.00)
GFR, Estimated: >60 mL/min (ref >60)
Glucose, Bld: 91 mg/dL (ref 70–99)
Potassium: 2.3 mmol/L — CL (ref 3.5–5.1)
Sodium: 134 mmol/L — ABNORMAL LOW (ref 135–145)
Total Bilirubin: 1.8 mg/dL — ABNORMAL HIGH (ref 0.3–1.2)
Total Protein: 7.3 g/dL (ref 6.5–8.1)

## 2022-03-01 LAB — CBC
HCT: 42.7 % (ref 36.0–46.0)
Hemoglobin: 14.7 g/dL (ref 12.0–15.0)
MCH: 34.5 pg — ABNORMAL HIGH (ref 26.0–34.0)
MCHC: 34.4 g/dL (ref 30.0–36.0)
MCV: 100.2 fL — ABNORMAL HIGH (ref 80.0–100.0)
Platelets: 211 10*3/uL (ref 150–400)
RBC: 4.26 MIL/uL (ref 3.87–5.11)
RDW: 13.6 % (ref 11.5–15.5)
WBC: 9.6 10*3/uL (ref 4.0–10.5)
nRBC: 0 % (ref 0.0–0.2)

## 2022-03-01 LAB — PROTIME-INR
INR: 1.2 (ref 0.8–1.2)
Prothrombin Time: 15.3 seconds — ABNORMAL HIGH (ref 11.4–15.2)

## 2022-03-01 LAB — TROPONIN I (HIGH SENSITIVITY)
Troponin I (High Sensitivity): 3 ng/L (ref <18)
Troponin I (High Sensitivity): 3 ng/L (ref <18)

## 2022-03-01 LAB — MAGNESIUM: Magnesium: 1.8 mg/dL (ref 1.7–2.4)

## 2022-03-01 MED ORDER — LORAZEPAM 1 MG PO TABS: 0.0000 mg | ORAL_TABLET | Freq: Two times a day (BID) | ORAL | Status: DC

## 2022-03-01 MED ORDER — LORAZEPAM 1 MG PO TABS
2.0000 mg | ORAL_TABLET | Freq: Four times a day (QID) | ORAL | Status: DC
  Administered 2022-03-01 – 2022-03-02 (×2): 2 mg via ORAL
  Filled 2022-03-01 (×2): qty 2

## 2022-03-01 MED ORDER — POTASSIUM CHLORIDE CRYS ER 20 MEQ PO TBCR
40.0000 meq | EXTENDED_RELEASE_TABLET | Freq: Once | ORAL | Status: AC
  Administered 2022-03-01: 40 meq via ORAL
  Filled 2022-03-01: qty 2

## 2022-03-01 MED ORDER — THIAMINE HCL 100 MG/ML IJ SOLN: 100.0000 mg | Freq: Every day | INTRAMUSCULAR | Status: DC

## 2022-03-01 MED ORDER — THIAMINE MONONITRATE 100 MG PO TABS
100.0000 mg | ORAL_TABLET | Freq: Every day | ORAL | Status: DC
  Administered 2022-03-01 – 2022-03-05 (×5): 100 mg via ORAL
  Filled 2022-03-01 (×5): qty 1

## 2022-03-01 MED ORDER — LORAZEPAM 1 MG PO TABS: 0.0000 mg | ORAL_TABLET | Freq: Four times a day (QID) | ORAL | Status: AC

## 2022-03-01 MED ORDER — PANTOPRAZOLE SODIUM 40 MG PO TBEC
40.0000 mg | DELAYED_RELEASE_TABLET | Freq: Every day | ORAL | Status: DC
  Administered 2022-03-01 – 2022-03-02 (×2): 40 mg via ORAL
  Filled 2022-03-01 (×2): qty 1

## 2022-03-01 MED ORDER — ONDANSETRON HCL 4 MG PO TABS: 4.0000 mg | ORAL_TABLET | Freq: Four times a day (QID) | ORAL | Status: DC | PRN

## 2022-03-01 MED ORDER — LORAZEPAM 1 MG PO TABS: 2.0000 mg | ORAL_TABLET | Freq: Two times a day (BID) | ORAL | Status: DC

## 2022-03-01 MED ORDER — POTASSIUM CHLORIDE 10 MEQ/100ML IV SOLN
10.0000 meq | INTRAVENOUS | Status: AC
  Administered 2022-03-01 – 2022-03-02 (×6): 10 meq via INTRAVENOUS
  Filled 2022-03-01 (×6): qty 100

## 2022-03-01 MED ORDER — ALPRAZOLAM 0.25 MG PO TABS
0.2500 mg | ORAL_TABLET | Freq: Two times a day (BID) | ORAL | Status: DC | PRN
  Administered 2022-03-02 – 2022-03-04 (×3): 0.25 mg via ORAL
  Filled 2022-03-01 (×3): qty 1

## 2022-03-01 MED ORDER — ONDANSETRON HCL 4 MG/2ML IJ SOLN: 4.0000 mg | Freq: Four times a day (QID) | INTRAMUSCULAR | Status: DC | PRN

## 2022-03-01 MED ORDER — NICOTINE 21 MG/24HR TD PT24
21.0000 mg | MEDICATED_PATCH | Freq: Every day | TRANSDERMAL | Status: DC
  Administered 2022-03-01 – 2022-03-05 (×5): 21 mg via TRANSDERMAL
  Filled 2022-03-01 (×5): qty 1

## 2022-03-01 NOTE — H&P (Signed)
History and Physical    Patient: Lynn Harvey DOB: [birth year] DOA: 03/01/2022 DOS: the patient was seen and examined on 03/01/2022 PCP: [name], MD  Patient coming from: Home  Chief Complaint:  Chief Complaint  Patient presents with   Abnormal Lab   Chest Pain   HPI: Lynn Harvey is a 57 y.o. female with medical history significant of alcohol abuse, ADD, depression with anxiety, GERD, coronary artery disease, Sjogren's syndrome, hyperlipidemia, essential hypertension, tobacco abuse who is being followed at [facility].  She was recently diagnosed with alcoholic cirrhosis with some ascites.  Patient is also diagnosed with acute gastritis.  She has been on PPI.  Patient came to the ER where she was seen and evaluated.  She is found to have a potassium of 2.3.  Patient also has significant ascites but no respiratory compromise.  She is being admitted for further evaluation and treatment.  Review of Systems: As mentioned in the history of present illness. All other systems reviewed and are negative. Past Medical History:  Diagnosis Date   Acute sensory neuropathy 09/18/2019   ADD (attention deficit disorder)    Allergy    Anemia    Anxiety    Coronary artery disease    a. s/p NSTEMI in 2015 with angioplasty alone to mid-LAD, repeat cath within the same month showing a patent site   Depression    Depression    Fatty liver    GAD (generalized anxiety disorder)    GERD (gastroesophageal reflux disease)    Gestational diabetes    History of heart attack    Hyperlipidemia    Hypertension    Insomnia    Lupus (HCC)    Milia    Myocardial infarction (HCC) 06/2013   Rectal abnormality    Sicca syndrome (HCC)    Sjogren's syndrome (HCC)    Telangiectasia    Vitamin D deficiency    Past Surgical History:  Procedure Laterality Date   ABDOMINAL HYSTERECTOMY     fibroids, endometrosis.    CHOLECYSTECTOMY  08/2018   COLON SURGERY     Flexible  Sigmoidoscopy   COLONOSCOPY N/A 01/08/2014   Procedure: COLONOSCOPY;  Surgeon: Theda Belfast, MD;  Location: [facility] ENDOSCOPY;  Service: Endoscopy;  Laterality: N/A;   ESOPHAGOGASTRODUODENOSCOPY N/A 01/08/2014   Procedure: ESOPHAGOGASTRODUODENOSCOPY (EGD);  Surgeon: Theda Belfast, MD;  Location: [facility];  Service: Endoscopy;  Laterality: N/A;   LEFT HEART CATHETERIZATION WITH CORONARY ANGIOGRAM N/A 06/24/2013   Procedure: LEFT HEART CATHETERIZATION WITH CORONARY ANGIOGRAM;  Surgeon: [name], MD;  Location: [facility] CATH LAB;  Service: Cardiovascular;  Laterality: N/A;   LEFT HEART CATHETERIZATION WITH CORONARY ANGIOGRAM N/A 07/17/2013   Procedure: LEFT HEART CATHETERIZATION WITH CORONARY ANGIOGRAM;  Surgeon: Kathleene Hazel, MD;  Location: [facility] CATH LAB;  Service: Cardiovascular;  Laterality: N/A;   TONSILECTOMY, ADENOIDECTOMY, BILATERAL MYRINGOTOMY AND TUBES     WRIST SURGERY  1987   RIGHT; bone from forearm grafted to wrist   Social History:  reports that she has been smoking cigarettes. She started smoking about 39 years ago. She has a 20.00 pack-year smoking history. She has never used smokeless tobacco. She reports that she does not currently use alcohol. She reports that she does not use drugs.  Allergies  Allergen Reactions   Buprenorphine Hcl Nausea And Vomiting   Doxycycline Nausea Only   Morphine Nausea And Vomiting   Morphine And Related Nausea And Vomiting   Ace Inhibitors  Cough    Family History  Problem Relation Age of Onset   COPD Mother        emphysema   Lung cancer Mother    Heart disease Father 64       s/p 3V CABG   Depression Father    Barrett's esophagus Father    Mental illness Sister        depression   Depression Sister    Multiple sclerosis Sister    Lung cancer Maternal Aunt    Diabetes Maternal Grandmother    Heart disease Maternal Grandmother    Diabetes Maternal Grandfather    Cancer Maternal Grandfather        lung cancer   Diabetes  Paternal Grandmother    Kidney disease Paternal Grandmother    Diabetes Paternal Grandfather    Heart disease Paternal Grandfather    Mental illness Son        ADHD, bipolar disorder   Neuropathy Neg Hx     Prior to Admission medications   Medication Sig Start Date End Date Taking? Authorizing Provider  acetaminophen-codeine (TYLENOL #3) 300-30 MG tablet Take 1 tablet by mouth every 8 (eight) hours as needed for moderate pain.  11/03/19   [provider]  ALPRAZolam Duanne Moron) 0.25 MG tablet Take 1-2 tabs (0.25mg -0.50mg ) 30-60 minutes before procedure. May repeat if needed.Do not drive. 06/27/21   Melvenia Beam, MD  amLODipine (NORVASC) 5 MG tablet TAKE 1 TABLET BY MOUTH EVERY DAY 01/16/21   Troy Sine, MD  amphetamine-dextroamphetamine (ADDERALL) 15 MG tablet Take 0.5 tablets by mouth 2 (two) times daily. Patient taking differently: Take 15 mg by mouth 2 (two) times daily. 05/17/14   Harrison Mons, PA  aspirin EC 81 MG tablet Take 81 mg by mouth daily.    [provider]  ezetimibe (ZETIA) 10 MG tablet TAKE 1 TABLET(10 MG) BY MOUTH DAILY 12/25/21   Troy Sine, MD  folic acid (FOLVITE) 1 MG tablet Take 1 tablet (1 mg total) by mouth daily. 11/20/19   Eugenie Filler, MD  gabapentin (NEURONTIN) 400 MG capsule TAKE 1 CAPSULE(400 MG) BY MOUTH THREE TIMES DAILY 01/22/22   Ward Givens, NP  Immune Globulin, Human, (GAMMAGARD) 20 GM/200ML SOLN Infused thru [facility] rx 06/29/21   Melvenia Beam, MD  metoprolol tartrate (LOPRESSOR) 25 MG tablet Take 1 tablet (25 mg total) by mouth 2 (two) times daily. 11/19/19   Eugenie Filler, MD  Multiple Vitamin (MULTIVITAMIN WITH MINERALS) TABS tablet Take 1 tablet by mouth daily. 09/22/19   Little Ishikawa, MD  nicotine (NICODERM CQ - DOSED IN MG/24 HOURS) 21 mg/24hr patch Place 1 patch (21 mg total) onto the skin daily. 11/20/19   Eugenie Filler, MD  nitroGLYCERIN (NITROSTAT) 0.4 MG SL tablet DISSOLVE 1 TABLET UNDER THE  TONGUE EVERY 5 MINUTES AS NEEDED FOR CHEST PAIN 02/06/21   Troy Sine, MD  omeprazole (PRILOSEC) 20 MG capsule Take 20 mg by mouth 2 (two) times daily before a meal.    [provider]  ondansetron (ZOFRAN) 4 MG tablet Take 1 tablet (4 mg total) by mouth every 8 (eight) hours as needed for nausea or vomiting. 07/12/18   Domenic Moras, PA-C  pilocarpine (SALAGEN) 5 MG tablet Take 5 mg by mouth 3 (three) times daily.  08/18/15   [provider]  pyridOXINE (B-6) 100 MG tablet Take 1 tablet (100 mg total) by mouth daily. 11/19/19   Eugenie Filler, MD  thiamine  100 MG tablet Take 1 tablet (100 mg total) by mouth daily. 11/20/19   Eugenie Filler, MD  valACYclovir (VALTREX) 500 MG tablet Take 500 mg by mouth 3 (three) times daily as needed (fever blister).    [provider]  zolpidem (AMBIEN) 5 MG tablet Take 5 mg by mouth at bedtime.  12/07/14   [provider]    Physical Exam: Vitals:   03/01/22 1930 03/01/22 1945 03/01/22 1951 03/01/22 2000  BP: 106/70 (!) 118/90 (!) 118/90   Pulse: 90 95 91   Resp: 15 (!) 23    Temp:    99.5 F (37.5 C)  TempSrc:      SpO2: 93% 93%     Constitutional: Chronically ill looking, mildly cachectic, no distress NAD, calm, comfortable Eyes: PERRL, lids and conjunctivae normal ENMT: Mucous membranes are moist. Posterior pharynx clear of any exudate or lesions.Normal dentition.  Neck: normal, supple, no masses, no thyromegaly Respiratory: clear to auscultation bilaterally, no wheezing, no crackles. Normal respiratory effort. No accessory muscle use.  Cardiovascular: Regular rate and rhythm, no murmurs / rubs / gallops. No extremity edema. 2+ pedal pulses. No carotid bruits.  Abdomen: Distended, positive ascites, no tenderness, no masses palpated. No hepatosplenomegaly. Bowel sounds positive.  Musculoskeletal: Good range of motion, no joint swelling or tenderness, Skin: no rashes, lesions, ulcers. No  induration Neurologic: CN 2-12 grossly intact. Sensation intact, DTR normal. Strength 5/5 in all 4.  Psychiatric: Normal judgment and insight. Alert and oriented x 3. Normal mood  Data Reviewed:  134 sodium, potassium 2.3, chloride 87, CO2 34, BUN less than 5, calcium 8.3, albumin 2.9, AST 52 ALT 20 troponin 7.3 total bilirubin 1.8.  White count is 9.6 hemoglobin 14.7 platelets 211.  PT 15.2 INR 1.2 glucose 91.  Chest x-ray showed no active disease.  Assessment and Plan:  #1 severe hypokalemia: A combination of poor appetite, alcoholism also.  Patient will be admitted.  Aggressively replete potassium with IV and oral routes.  #2 alcoholic cirrhosis: Patient is now being seen by GI.  Will defer to the GI at [facility].  #3 alcoholism: Initiate CIWA protocol.  Patient says she is quitting alcohol now.  #4 tobacco abuse: Counseling with nicotine patch  #5 coronary artery disease: Stable.  No new issues  #6 hyponatremia: Secondary to dehydration and alcoholism.     Advance Care Planning:   Code Status: Prior full code  Consults: None  Family Communication: Husband at bedside  Severity of Illness: The appropriate patient status for this patient is INPATIENT. Inpatient status is judged to be reasonable and necessary in order to provide the required intensity of service to ensure the patient's safety. The patient's presenting symptoms, physical exam findings, and initial radiographic and laboratory data in the context of their chronic comorbidities is felt to place them at high risk for further clinical deterioration. Furthermore, it is not anticipated that the patient will be medically stable for discharge from the hospital within 2 midnights of admission.   * I certify that at the point of admission it is my clinical judgment that the patient will require inpatient hospital care spanning beyond 2 midnights from the point of admission due to high intensity of service, high risk for  further deterioration and high frequency of surveillance required.*  AuthorBarbette Merino, MD 03/01/2022 8:49 PM  For on call review [email or web address].

## 2022-03-01 NOTE — ED Provider Notes (Signed)
[facility] DEPT Provider Note   CSN: [id number] Arrival date & time: 03/01/22  1614     History  Chief Complaint  Patient presents with   Abnormal Lab   Chest Pain    Lynn Harvey is a 57 y.o. female.  HPI Patient presents with concern of fatigue, nausea, possible lab abnormalities.  She notes that she drinks alcohol regularly, though less than she previously was consuming.  Today she went to outpatient GI appointment after recently being diagnosed with cirrhosis.  There she was informed of abnormal labs and encouraged to go to the emergency department.  She reportedly had hypokalemia. Currently denies focal pain, acknowledges generalized discomfort.  No confusion, dyspnea.    Home Medications Prior to Admission medications   Medication Sig Start Date End Date Taking? Authorizing Provider  acetaminophen-codeine (TYLENOL #3) 300-30 MG tablet Take 1 tablet by mouth every 8 (eight) hours as needed for moderate pain.  11/03/19   [provider]  ALPRAZolam Duanne Moron) 0.25 MG tablet Take 1-2 tabs (0.25mg -0.50mg ) 30-60 minutes before procedure. May repeat if needed.Do not drive. 06/27/21   Melvenia Beam, MD  amLODipine (NORVASC) 5 MG tablet TAKE 1 TABLET BY MOUTH EVERY DAY 01/16/21   Troy Sine, MD  amphetamine-dextroamphetamine (ADDERALL) 15 MG tablet Take 0.5 tablets by mouth 2 (two) times daily. Patient taking differently: Take 15 mg by mouth 2 (two) times daily. 05/17/14   Harrison Mons, PA  aspirin EC 81 MG tablet Take 81 mg by mouth daily.    [provider]  ezetimibe (ZETIA) 10 MG tablet TAKE 1 TABLET(10 MG) BY MOUTH DAILY 12/25/21   Troy Sine, MD  folic acid (FOLVITE) 1 MG tablet Take 1 tablet (1 mg total) by mouth daily. 11/20/19   Eugenie Filler, MD  gabapentin (NEURONTIN) 400 MG capsule TAKE 1 CAPSULE(400 MG) BY MOUTH THREE TIMES DAILY 01/22/22   Ward Givens, NP  Immune Globulin, Human, (GAMMAGARD) 20 GM/200ML  SOLN Infused thru [facility] rx 06/29/21   Melvenia Beam, MD  metoprolol tartrate (LOPRESSOR) 25 MG tablet Take 1 tablet (25 mg total) by mouth 2 (two) times daily. 11/19/19   Eugenie Filler, MD  Multiple Vitamin (MULTIVITAMIN WITH MINERALS) TABS tablet Take 1 tablet by mouth daily. 09/22/19   Little Ishikawa, MD  nicotine (NICODERM CQ - DOSED IN MG/24 HOURS) 21 mg/24hr patch Place 1 patch (21 mg total) onto the skin daily. 11/20/19   Eugenie Filler, MD  nitroGLYCERIN (NITROSTAT) 0.4 MG SL tablet DISSOLVE 1 TABLET UNDER THE TONGUE EVERY 5 MINUTES AS NEEDED FOR CHEST PAIN 02/06/21   Troy Sine, MD  omeprazole (PRILOSEC) 20 MG capsule Take 20 mg by mouth 2 (two) times daily before a meal.    [provider]  ondansetron (ZOFRAN) 4 MG tablet Take 1 tablet (4 mg total) by mouth every 8 (eight) hours as needed for nausea or vomiting. 07/12/18   Domenic Moras, PA-C  pilocarpine (SALAGEN) 5 MG tablet Take 5 mg by mouth 3 (three) times daily.  08/18/15   [provider]  pyridOXINE (B-6) 100 MG tablet Take 1 tablet (100 mg total) by mouth daily. 11/19/19   Eugenie Filler, MD  thiamine 100 MG tablet Take 1 tablet (100 mg total) by mouth daily. 11/20/19   Eugenie Filler, MD  valACYclovir (VALTREX) 500 MG tablet Take 500 mg by mouth 3 (three) times daily as needed (fever blister).    [provider]  zolpidem (AMBIEN) 5 MG tablet Take 5 mg by mouth at bedtime.  12/07/14   [provider]      Allergies    Buprenorphine hcl, Doxycycline, Morphine, Morphine and related, and Ace inhibitors    Review of Systems   Review of Systems  All other systems reviewed and are negative.   Physical Exam Updated Vital Signs BP 135/74   Pulse 90   Temp 99.5 F (37.5 C) (Oral)   Resp 13   SpO2 96%  Physical Exam Vitals and nursing note reviewed.  Constitutional:      General: She is not in acute distress.    Appearance: She is well-developed.  HENT:     Head:  Normocephalic and atraumatic.  Eyes:     Conjunctiva/sclera: Conjunctivae normal.  Cardiovascular:     Rate and Rhythm: Normal rate and regular rhythm.  Pulmonary:     Effort: Pulmonary effort is normal. No respiratory distress.     Breath sounds: Normal breath sounds. No stridor.  Abdominal:     General: There is no distension.     Comments: Mild amount of ascites nontender, no guarding  Skin:    General: Skin is warm and dry.  Neurological:     Mental Status: She is alert and oriented to person, place, and time.     Cranial Nerves: No cranial nerve deficit.  Psychiatric:        Mood and Affect: Mood normal.     ED Results / Procedures / Treatments   Labs (all labs ordered are listed, but only abnormal results are displayed) Labs Reviewed  CBC - Abnormal; Notable for the following components:      Result Value   MCV 100.2 (*)    MCH 34.5 (*)    All other components within normal limits  COMPREHENSIVE METABOLIC PANEL - Abnormal; Notable for the following components:   Sodium 134 (*)    Potassium 2.3 (*)    Chloride 87 (*)    CO2 34 (*)    BUN <5 (*)    Calcium 8.3 (*)    Albumin 2.9 (*)    AST 52 (*)    Total Bilirubin 1.8 (*)    All other components within normal limits  PROTIME-INR - Abnormal; Notable for the following components:   Prothrombin Time 15.3 (*)    All other components within normal limits  MAGNESIUM  TROPONIN I (HIGH SENSITIVITY)  TROPONIN I (HIGH SENSITIVITY)    EKG EKG Interpretation  Date/Time:  Thursday March 01 2022 16:33:25 EST Ventricular Rate:  95 PR Interval:  162 QRS Duration: 77 QT Interval:  378 QTC Calculation: 476 R Axis:   71 Text Interpretation: Sinus rhythm Probable anterior infarct, age indeterminate T wave abnormality Artifact Abnormal ECG Confirmed by Carmin Muskrat [phone]) on 03/01/2022 5:12:29 PM  Radiology DG Chest 2 View  Result Date: 03/01/2022 CLINICAL DATA:  Chest pain EXAM: CHEST - 2 VIEW COMPARISON:   12/17/2013 FINDINGS: The heart size and mediastinal contours are within normal limits. Aortic atherosclerosis. Both lungs are clear. The visualized skeletal structures are unremarkable. IMPRESSION: No active cardiopulmonary disease. Electronically Signed   By: Donavan Foil M.D.   On: 03/01/2022 17:13    Procedures Procedures    Medications Ordered in ED Medications  potassium chloride 10 mEq in 100 mL IVPB (10 mEq Intravenous New Bag/Given 03/01/22 1902)  LORazepam (ATIVAN) tablet 2 mg (has no administration in time range)    Or  LORazepam (ATIVAN) tablet 0-4  mg (has no administration in time range)  LORazepam (ATIVAN) tablet 2 mg (has no administration in time range)    Or  LORazepam (ATIVAN) tablet 0-4 mg (has no administration in time range)  thiamine (VITAMIN B1) tablet 100 mg (has no administration in time range)    Or  thiamine (VITAMIN B1) injection 100 mg (has no administration in time range)  potassium chloride SA (KLOR-CON M) CR tablet 40 mEq (40 mEq Oral Given 03/01/22 1902)    ED Course/ Medical Decision Making/ A&P This patient with a Hx of alcohol use disorder, prior MI, cirrhosis presents to the ED for concern of fatigue, hypokalemia, this involves an extensive number of treatment options, and is a complaint that carries with it a high risk of complications and morbidity.    The differential diagnosis includes worsening renal or hepatic function, dehydration, ongoing alcohol use disorder complication   Social Determinants of Health:  Alcohol use  After the initial evaluation, orders, including: Labs monitoring were initiated.   Patient placed on Cardiac and Pulse-Oximetry Monitors. The patient was maintained on a cardiac monitor.  The cardiac monitored showed an rhythm of 85 sinus normal The patient was also maintained on pulse oximetry. The readings were typically 100% room air normal   On repeat evaluation of the patient stayed the same  Lab Tests:  I  personally interpreted labs.  The pertinent results include: Hypokalemia, potassium 2.3.  Patient's troponins x 2 are normal, EKG is nonischemic  Imaging Studies ordered:  I independently visualized and interpreted imaging which showed unremarkable chest x-ray I agree with the radiologist interpretation Dispostion / Final MDM:  After consideration of the diagnostic results and the patient's response to treatment, this adult female with history of alcohol abuse, cardiac disease presents with fatigue, in the context of ongoing alcohol use, recent diagnosis of cirrhosis.  Patient is awake and alert, has no evidence for decompensated hepatic function, does have evidence for critically abnormal potassium value requiring repletion with both IV and oral.  Patient's other studies are generally reassuring, without evidence for concurrent ACS.  Patient last drink about 24 hours ago, was started on CIWA protocol, though there is no evidence for complicated withdrawal currently.    Final Clinical Impression(s) / ED Diagnoses Final diagnoses:  Hypokalemia     Gerhard Munch, MD 03/01/22 1947

## 2022-03-01 NOTE — ED Notes (Signed)
O2 noted to be 86% on RA while pt sleeping. 2.5L supplemental O2 applied to pt w/ improvement in O2 sat to 91%.

## 2022-03-01 NOTE — ED Triage Notes (Signed)
Pt states she was at PCP's office today for follow up on cirrhosis and ascites and it was found that she had a potassium of 2.5. Pt also c/o chest pressure for two weeks.

## 2022-03-01 NOTE — ED Provider Triage Note (Signed)
Emergency Medicine Provider Triage Evaluation Note  Lynn Harvey , a 57 y.o. female  was evaluated in triage.  Patient presenting with hypokalemia.  Daughter gastroenterologist with [facility] who did labs today and noted that her potassium was 2.5.  She also mentioned some chest pain.  Reports multiple drinks daily however right now she is only drinking a few white claws a night.  Last drink last night.  Denies any diuretics for her ascites.  She was supposed to start 1 today but has not picked it up from the pharmacy yet.  Review of Systems  Positive:  Negative:   Physical Exam  BP (!) 159/71 (BP Location: Left Arm)   Pulse 96   Resp 16   SpO2 97%  Gen:   Awake, no distress   Resp:  Normal effort  MSK:   Moves extremities without difficulty  Other:  Clear ascites.  Regular rate and rhythm  Medical Decision Making  Medically screening exam initiated at 4:23 PM.  Appropriate orders placed.  Lynn Harvey was informed that the remainder of the evaluation will be completed by another provider, this initial triage assessment does not replace that evaluation, and the importance of remaining in the ED until their evaluation is complete.     Rhae Hammock, PA-C 03/01/22 1624

## 2022-03-02 DIAGNOSIS — E876 Hypokalemia: Secondary | ICD-10-CM | POA: Diagnosis not present

## 2022-03-02 LAB — COMPREHENSIVE METABOLIC PANEL
ALT: 16 U/L (ref 0–44)
AST: 36 U/L (ref 15–41)
Albumin: 2.2 g/dL — ABNORMAL LOW (ref 3.5–5.0)
Alkaline Phosphatase: 91 U/L (ref 38–126)
Anion gap: 8 (ref 5–15)
BUN: <5 mg/dL — ABNORMAL LOW (ref 6–20)
CO2: 29 mmol/L (ref 22–32)
Calcium: 7.6 mg/dL — ABNORMAL LOW (ref 8.9–10.3)
Chloride: 99 mmol/L (ref 98–111)
Creatinine, Ser: 0.56 mg/dL (ref 0.44–1.00)
GFR, Estimated: >60 mL/min (ref >60)
Glucose, Bld: 98 mg/dL (ref 70–99)
Potassium: 2.9 mmol/L — ABNORMAL LOW (ref 3.5–5.1)
Sodium: 136 mmol/L (ref 135–145)
Total Bilirubin: 1.8 mg/dL — ABNORMAL HIGH (ref 0.3–1.2)
Total Protein: 5.7 g/dL — ABNORMAL LOW (ref 6.5–8.1)

## 2022-03-02 LAB — CBC
HCT: 37.6 % (ref 36.0–46.0)
Hemoglobin: 12.7 g/dL (ref 12.0–15.0)
MCH: 34.7 pg — ABNORMAL HIGH (ref 26.0–34.0)
MCHC: 33.8 g/dL (ref 30.0–36.0)
MCV: 102.7 fL — ABNORMAL HIGH (ref 80.0–100.0)
Platelets: 163 10*3/uL (ref 150–400)
RBC: 3.66 MIL/uL — ABNORMAL LOW (ref 3.87–5.11)
RDW: 14 % (ref 11.5–15.5)
WBC: 6.5 10*3/uL (ref 4.0–10.5)
nRBC: 0 % (ref 0.0–0.2)

## 2022-03-02 LAB — MAGNESIUM: Magnesium: 1.7 mg/dL (ref 1.7–2.4)

## 2022-03-02 MED ORDER — MAGNESIUM SULFATE 2 GM/50ML IV SOLN
2.0000 g | Freq: Once | INTRAVENOUS | Status: AC
  Administered 2022-03-02: 2 g via INTRAVENOUS
  Filled 2022-03-02: qty 50

## 2022-03-02 MED ORDER — POTASSIUM CHLORIDE CRYS ER 20 MEQ PO TBCR
40.0000 meq | EXTENDED_RELEASE_TABLET | Freq: Once | ORAL | Status: AC
  Administered 2022-03-02: 40 meq via ORAL
  Filled 2022-03-02: qty 2

## 2022-03-02 MED ORDER — POTASSIUM CHLORIDE 10 MEQ/100ML IV SOLN
10.0000 meq | INTRAVENOUS | Status: AC
  Administered 2022-03-02 – 2022-03-03 (×4): 10 meq via INTRAVENOUS

## 2022-03-02 MED ORDER — SODIUM CHLORIDE 0.9 % IV SOLN: INTRAVENOUS | Status: AC

## 2022-03-02 MED ORDER — POTASSIUM CHLORIDE 10 MEQ/100ML IV SOLN
10.0000 meq | INTRAVENOUS | Status: AC
  Administered 2022-03-02 (×2): 10 meq via INTRAVENOUS
  Filled 2022-03-02 (×2): qty 100

## 2022-03-02 MED ORDER — GABAPENTIN 400 MG PO CAPS
400.0000 mg | ORAL_CAPSULE | Freq: Three times a day (TID) | ORAL | Status: DC
  Administered 2022-03-02 – 2022-03-05 (×8): 400 mg via ORAL
  Filled 2022-03-02 (×8): qty 1

## 2022-03-02 MED ORDER — ORAL CARE MOUTH RINSE: 15.0000 mL | OROMUCOSAL | Status: DC | PRN

## 2022-03-02 MED ORDER — PILOCARPINE HCL 5 MG PO TABS
5.0000 mg | ORAL_TABLET | Freq: Two times a day (BID) | ORAL | Status: DC
  Administered 2022-03-02 – 2022-03-05 (×6): 5 mg via ORAL
  Filled 2022-03-02 (×6): qty 1

## 2022-03-02 MED ORDER — WITCH HAZEL-GLYCERIN EX PADS
MEDICATED_PAD | CUTANEOUS | Status: DC | PRN
  Filled 2022-03-02: qty 100

## 2022-03-02 NOTE — Progress Notes (Signed)
PROGRESS NOTE    Lynn Harvey  STM:[id number] DOB: Feb 17, 1966 DOA: 03/01/2022 PCP: Karleen Hampshire., MD  Outpatient Specialists:     Brief Narrative:  Patient is a 57 year old female with history of alcohol abuse that has continued and alcoholic liver cirrhosis with ascites.  Apparently, patient has been on Protonix for acute gastritis.  Patient was seen by her primary care provider and was noted to have potassium of 2.3.  Magnesium on presentation was 1.8.  Patient has been admitted for further assessment and management.  03/02/2022: Lab work done today revealed sodium of 136, potassium of 2.9, BUN of less than 5, serum creatinine of 0.56, albumin of 2.2, total protein of 5.7 and total bilirubin of 1.8, AST of 36 with ALT of 16.  INR on presentation was 1.2 with PT of 15.3 seconds.  Patient is not a particularly good historian.     Assessment & Plan:   Principal Problem:   Hypokalemia Active Problems:   Tobacco abuse   Essential hypertension   Coronary artery disease involving native coronary artery of native heart without angina pectoris   Gastroesophageal reflux disease without esophagitis   Sjogren's syndrome (HCC)   Dehydration with hyponatremia   Alcohol abuse   Alcoholic hepatitis   Severe hypokalemia: -Potassium of 2.3 on presentation.  Potassium is increased to 2.9.  Patient was given KCl 40 mEq p.o. x 1 dose. -Discontinue Protonix. -Magnesium level of 1.8 noted. -IV magnesium 2 g x 1 dose. -K-Dur 40 mEq p.o. x 1 dose. -IV KCl 10 mEq every hour x 4 doses. -Monitor renal function, electrolytes and magnesium closely. -Telemetry monitoring.  Alcoholic liver disease with cirrhosis and ascites: -Last alcohol use was 2 days ago. -Patient has been counseled to quit alcohol use. -Supportive care. -Quantify and drain ascites as deemed necessary.  If patient proceeds with paracentesis, will need to rule out peritonitis. -Patient will benefit from liver transplantation at  some point if patient is able to quit alcohol use.  Alcohol abuse with dependence: -Counseled. -CIWA protocol.  Tobacco abuse: -Counseled. -Continue nicotine patch.  Coronary artery disease: -Stable. -No symptoms.  Hypoalbuminemia: -Likely related to liver disease. -May have prognostic significance  Hyponatremia: -Resolved. -Sodium is 136 today. -May have prognostic significance.  Moderate to severe malnutrition: -Consult dietary team. -Caloric count.  Overall, patient's prognosis is guarded.   DVT prophylaxis: SCD. Code Status: Full code Family Communication:  Disposition Plan: Home eventually   Consultants:  None for now. Low threshold to consult GI  Procedures:  Low threshold to proceed with abdominal paracentesis.  Antimicrobials:  None.   Subjective: Poor historian. -Ascites. -Leg edema.  Objective: Vitals:   03/02/22 0730 03/02/22 1000 03/02/22 1117 03/02/22 1353  BP: 116/64 119/86  120/65  Pulse: 85 90  93  Resp: 16 16  19   Temp:   98 F (36.7 C) 98.5 F (36.9 C)  TempSrc:   Oral Oral  SpO2: 94% 92%  93%    Intake/Output Summary (Last 24 hours) at 03/02/2022 1547 Last data filed at 03/02/2022 0641 Gross per 24 hour  Intake 600 ml  Output --  Net 600 ml   There were no vitals filed for this visit.  Examination:  General exam: Appears calm and comfortable  Respiratory system: Clear to auscultation.  Cardiovascular system: S1 & S2 heard Gastrointestinal system: Abdomen is soft and nontender.  Organs are difficult to assess.  Patient has ascites.   Central nervous system: Alert and oriented..  Patient moves all  extremities.  Extremities: Mild leg edema.  Data Reviewed: I have personally reviewed following labs and imaging studies  CBC: Recent Labs  Lab 03/01/22 1640 03/02/22 0646  WBC 9.6 6.5  HGB 14.7 12.7  HCT 42.7 37.6  MCV 100.2* 102.7*  PLT 211 062   Basic Metabolic Panel: Recent Labs  Lab 03/01/22 1640  03/02/22 0646  NA 134* 136  K 2.3* 2.9*  CL 87* 99  CO2 34* 29  GLUCOSE 91 98  BUN <5* <5*  CREATININE 0.61 0.56  CALCIUM 8.3* 7.6*  MG 1.8  --    GFR: CrCl cannot be calculated (Unknown ideal weight.). Liver Function Tests: Recent Labs  Lab 03/01/22 1640 03/02/22 0646  AST 52* 36  ALT 20 16  ALKPHOS 121 91  BILITOT 1.8* 1.8*  PROT 7.3 5.7*  ALBUMIN 2.9* 2.2*   No results for input(s): "LIPASE", "AMYLASE" in the last 168 hours. No results for input(s): "AMMONIA" in the last 168 hours. Coagulation Profile: Recent Labs  Lab 03/01/22 1640  INR 1.2   Cardiac Enzymes: No results for input(s): "CKTOTAL", "CKMB", "CKMBINDEX", "TROPONINI" in the last 168 hours. BNP (last 3 results) No results for input(s): "PROBNP" in the last 8760 hours. HbA1C: No results for input(s): "HGBA1C" in the last 72 hours. CBG: No results for input(s): "GLUCAP" in the last 168 hours. Lipid Profile: No results for input(s): "CHOL", "HDL", "LDLCALC", "TRIG", "CHOLHDL", "LDLDIRECT" in the last 72 hours. Thyroid Function Tests: No results for input(s): "TSH", "T4TOTAL", "FREET4", "T3FREE", "THYROIDAB" in the last 72 hours. Anemia Panel: No results for input(s): "VITAMINB12", "FOLATE", "FERRITIN", "TIBC", "IRON", "RETICCTPCT" in the last 72 hours. Urine analysis:    Component Value Date/Time   COLORURINE YELLOW 11/17/2019 1608   APPEARANCEUR CLOUDY (A) 11/17/2019 1608   LABSPEC 1.006 11/17/2019 1608   PHURINE 6.0 11/17/2019 1608   GLUCOSEU 50 (A) 11/17/2019 1608   HGBUR NEGATIVE 11/17/2019 1608   BILIRUBINUR NEGATIVE 11/17/2019 1608   KETONESUR NEGATIVE 11/17/2019 1608   PROTEINUR NEGATIVE 11/17/2019 1608   NITRITE POSITIVE (A) 11/17/2019 1608   LEUKOCYTESUR LARGE (A) 11/17/2019 1608   Sepsis Labs: @LABRCNTIP (procalcitonin:4,lacticidven:4)  )No results found for this or any previous visit (from the past 240 hour(s)).       Radiology Studies: DG Chest 2 View  Result Date:  03/01/2022 CLINICAL DATA:  Chest pain EXAM: CHEST - 2 VIEW COMPARISON:  12/17/2013 FINDINGS: The heart size and mediastinal contours are within normal limits. Aortic atherosclerosis. Both lungs are clear. The visualized skeletal structures are unremarkable. IMPRESSION: No active cardiopulmonary disease. Electronically Signed   By: Donavan Foil M.D.   On: 03/01/2022 17:13        Scheduled Meds:  LORazepam  0-4 mg Oral Q6H   [START ON 03/04/2022] LORazepam  2 mg Oral Q12H   Or   [START ON 03/04/2022] LORazepam  0-4 mg Oral Q12H   nicotine  21 mg Transdermal Daily   pantoprazole  40 mg Oral Daily   thiamine  100 mg Oral Daily   Or   thiamine  100 mg Intravenous Daily   Continuous Infusions:   LOS: 1 day    Time spent: 55 minutes.    Dana Allan, MD  [facility] Pager #: [phone] 7PM-7AM contact night coverage as above

## 2022-03-02 NOTE — Plan of Care (Signed)

## 2022-03-03 ENCOUNTER — Inpatient Hospital Stay (HOSPITAL_COMMUNITY): Payer: BC Managed Care – PPO

## 2022-03-03 DIAGNOSIS — E860 Dehydration: Secondary | ICD-10-CM | POA: Diagnosis not present

## 2022-03-03 DIAGNOSIS — I2510 Atherosclerotic heart disease of native coronary artery without angina pectoris: Secondary | ICD-10-CM | POA: Diagnosis not present

## 2022-03-03 DIAGNOSIS — I10 Essential (primary) hypertension: Secondary | ICD-10-CM

## 2022-03-03 DIAGNOSIS — F1010 Alcohol abuse, uncomplicated: Secondary | ICD-10-CM | POA: Diagnosis not present

## 2022-03-03 DIAGNOSIS — E876 Hypokalemia: Secondary | ICD-10-CM

## 2022-03-03 DIAGNOSIS — Z720 Tobacco use: Secondary | ICD-10-CM

## 2022-03-03 DIAGNOSIS — K219 Gastro-esophageal reflux disease without esophagitis: Secondary | ICD-10-CM

## 2022-03-03 DIAGNOSIS — E871 Hypo-osmolality and hyponatremia: Secondary | ICD-10-CM

## 2022-03-03 LAB — COMPREHENSIVE METABOLIC PANEL
ALT: 15 U/L (ref 0–44)
AST: 39 U/L (ref 15–41)
Albumin: 2.5 g/dL — ABNORMAL LOW (ref 3.5–5.0)
Alkaline Phosphatase: 102 U/L (ref 38–126)
Anion gap: 9 (ref 5–15)
BUN: 5 mg/dL — ABNORMAL LOW (ref 6–20)
CO2: 26 mmol/L (ref 22–32)
Calcium: 8 mg/dL — ABNORMAL LOW (ref 8.9–10.3)
Chloride: 101 mmol/L (ref 98–111)
Creatinine, Ser: 0.54 mg/dL (ref 0.44–1.00)
GFR, Estimated: >60 mL/min (ref >60)
Glucose, Bld: 85 mg/dL (ref 70–99)
Potassium: 4.1 mmol/L (ref 3.5–5.1)
Sodium: 136 mmol/L (ref 135–145)
Total Bilirubin: 1.8 mg/dL — ABNORMAL HIGH (ref 0.3–1.2)
Total Protein: 6.2 g/dL — ABNORMAL LOW (ref 6.5–8.1)

## 2022-03-03 LAB — GRAM STAIN

## 2022-03-03 LAB — BODY FLUID CELL COUNT WITH DIFFERENTIAL
Eos, Fluid: 0 %
Lymphs, Fluid: 51 %
Monocyte-Macrophage-Serous Fluid: 46 % — ABNORMAL LOW (ref 50–90)
Neutrophil Count, Fluid: 3 % (ref 0–25)
Total Nucleated Cell Count, Fluid: 251 cu mm (ref 0–1000)

## 2022-03-03 LAB — ALBUMIN, PLEURAL OR PERITONEAL FLUID: Albumin, Fluid: <1.5 g/dL

## 2022-03-03 LAB — HIV ANTIBODY (ROUTINE TESTING W REFLEX): HIV Screen 4th Generation wRfx: NONREACTIVE

## 2022-03-03 MED ORDER — SPIRONOLACTONE 25 MG PO TABS
25.0000 mg | ORAL_TABLET | Freq: Every day | ORAL | Status: DC
  Administered 2022-03-03 – 2022-03-04 (×2): 25 mg via ORAL
  Filled 2022-03-03 (×2): qty 1

## 2022-03-03 MED ORDER — SODIUM CHLORIDE 0.9 % IV SOLN
2.0000 g | INTRAVENOUS | Status: DC
  Administered 2022-03-03: 2 g via INTRAVENOUS
  Filled 2022-03-03 (×2): qty 20

## 2022-03-03 MED ORDER — LIDOCAINE HCL 1 % IJ SOLN
INTRAMUSCULAR | Status: AC
  Administered 2022-03-03: 10 mL
  Filled 2022-03-03: qty 20

## 2022-03-03 MED ORDER — FUROSEMIDE 20 MG PO TABS
20.0000 mg | ORAL_TABLET | Freq: Every day | ORAL | Status: DC
  Administered 2022-03-04: 20 mg via ORAL
  Filled 2022-03-03: qty 1

## 2022-03-03 NOTE — Procedures (Signed)
PROCEDURE SUMMARY:  Successful image-guided paracentesis from the right lower abdomen.  Yielded 1.6 liters of hazy yellow fluid.  No immediate complications.  EBL = trace. Patient tolerated well.   Specimen was  sent for labs.  Please see imaging section of Epic for full dictation.  If the patient eventually requires >/=2 paracenteses in a 30 day period, screening evaluation by the [facility] will be assessed.   Armando Gang Syanna Remmert PA-C 03/03/2022 10:42 AM

## 2022-03-03 NOTE — Progress Notes (Signed)
PROGRESS NOTE    Lynn Harvey  MBT:[id number] DOB: [birth year] DOA: 03/01/2022 PCP: Karleen Hampshire., MD   Brief Narrative:   Patient is a 57 year old female with past medical history of alcohol abuse, cirrhosis of liver and ascites was seen by her primary care physician and was noted to have potassium of 2.3 she was sent to the hospital for further evaluation and treatment.  Lab work done in the ED showed potassium of 2.9 with albumin of 2.2 INR was 1.2.  Patient was then admitted hospital for further evaluation and treatment.    Assessment & Plan:   Principal Problem:   Hypokalemia Active Problems:   Dehydration with hyponatremia   Alcohol abuse   Tobacco abuse   Essential hypertension   Coronary artery disease involving native coronary artery of native heart without angina pectoris   Gastroesophageal reflux disease without esophagitis   Sjogren's syndrome (HCC)   Alcoholic hepatitis   Severe hypokalemia: -Potassium of 2.3 on presentation.  Received potassium aggressively.  Potassium today at 4.1.  Will monitor closely and replenish as necessary.  Alcoholic liver disease with cirrhosis and ascites, coagulopathy: Last alcohol use was 2 days prior to presentation.  Has history of ascites.  Counseled on quitting alcohol. Continue CIWA protocol.  No obvious signs of withdrawal at this time.  She is motivated to quit.  Will get TOC consultation.  Cirrhosis and underwent ultrasound-guided paracentesis today with removal of 1.6 L of hazy peritoneal fluid.  Gram stain pending but nucleated cell count at 251.  Will start the patient on Rocephin IV empirically.  Will add low-dose spironolactone and Lasix.  Intake and output charting Daily weights.  Abdominal pain, ascites, peritoneal fluid count at 251.  Possibility of spontaneous bacterial peritonitis.  Will put the patient on IV Rocephin.  Follow culture and sensitivity.  No evidence of encephalopathy at this time.  Tobacco abuse: Continue  nicotine patch.  Coronary artery disease: Stable.  No acute issues.  Denies chest pain.  Hypoalbuminemia: Secondary diabetes and poor oral intake.  Hyponatremia: Mild hyponatremia improved.  Sodium today at 136.  Moderate to severe malnutrition: Present on admission.  Nutrition services has been consulted  DVT prophylaxis: SCD.  Code Status: Full code  Family Communication: None at bedside.  Disposition Plan: Home likely in 2 to 3 days   Consultants:  Interventional radiology  Procedures:  Ultrasound-guided paracentesis on 03/03/2022  Antimicrobials:  Rocephin IV   Subjective: Today, patient was seen and examined at bedside.  Appears tearful at times.  Denies any withdrawal symptoms including hallucinations.  Complains of abdominal pain and distention.  Seen before ultrasound-guided paracentesis.  Objective: Vitals:   03/02/22 1353 03/02/22 1726 03/02/22 2203 03/03/22 0137  BP: 120/65 127/71 109/73 110/72  Pulse: 93 97 96 92  Resp: 19 18 18 17   Temp: 98.5 F (36.9 C) 98.4 F (36.9 C) 98.7 F (37.1 C) 98.5 F (36.9 C)  TempSrc: Oral Oral Oral Oral  SpO2: 93% 92% 94% 94%    Intake/Output Summary (Last 24 hours) at 03/03/2022 0802 Last data filed at 03/03/2022 0500 Gross per 24 hour  Intake 966.6 ml  Output --  Net 966.6 ml    There were no vitals filed for this visit.  Physical examination: There is no height or weight on file to calculate BMI.   General:  Average built, not in obvious distress, appears anxious and tearful, HENT:   No scleral pallor or icterus noted. Oral mucosa is moist.  Chest:  Clear breath sounds.  Diminished breath sounds bilaterally. No crackles or wheezes.  CVS: S1 &S2 heard. No murmur.  Regular rate and rhythm. Abdomen: Soft, nontender, nondistended.  Bowel sounds are heard.  Fullness of the flanks with nonspecific tenderness on palpation, ascites. Extremities: No cyanosis, clubbing with bilateral trace pedal edema.  Peripheral  pulses are palpable. Psych: Alert, awake and oriented, anxious mood, CNS:  No cranial nerve deficits.  Power equal in all extremities.   Skin: Warm and dry.  No rashes noted.  Data Reviewed: I have personally reviewed following labs and imaging studies  CBC: Recent Labs  Lab 03/01/22 1640 03/02/22 0646  WBC 9.6 6.5  HGB 14.7 12.7  HCT 42.7 37.6  MCV 100.2* 102.7*  PLT 211 440    Basic Metabolic Panel: Recent Labs  Lab 03/01/22 1640 03/02/22 0646 03/02/22 1613 03/03/22 0648  NA 134* 136  --  136  K 2.3* 2.9*  --  4.1  CL 87* 99  --  101  CO2 34* 29  --  26  GLUCOSE 91 98  --  85  BUN <5* <5*  --  5*  CREATININE 0.61 0.56  --  0.54  CALCIUM 8.3* 7.6*  --  8.0*  MG 1.8  --  1.7  --     GFR: CrCl cannot be calculated (Unknown ideal weight.). Liver Function Tests: Recent Labs  Lab 03/01/22 1640 03/02/22 0646 03/03/22 0648  AST 52* 36 39  ALT 20 16 15   ALKPHOS 121 91 102  BILITOT 1.8* 1.8* 1.8*  PROT 7.3 5.7* 6.2*  ALBUMIN 2.9* 2.2* 2.5*    No results for input(s): "LIPASE", "AMYLASE" in the last 168 hours. No results for input(s): "AMMONIA" in the last 168 hours. Coagulation Profile: Recent Labs  Lab 03/01/22 1640  INR 1.2    Cardiac Enzymes: No results for input(s): "CKTOTAL", "CKMB", "CKMBINDEX", "TROPONINI" in the last 168 hours. BNP (last 3 results) No results for input(s): "PROBNP" in the last 8760 hours. HbA1C: No results for input(s): "HGBA1C" in the last 72 hours. CBG: No results for input(s): "GLUCAP" in the last 168 hours. Lipid Profile: No results for input(s): "CHOL", "HDL", "LDLCALC", "TRIG", "CHOLHDL", "LDLDIRECT" in the last 72 hours. Thyroid Function Tests: No results for input(s): "TSH", "T4TOTAL", "FREET4", "T3FREE", "THYROIDAB" in the last 72 hours. Anemia Panel: No results for input(s): "VITAMINB12", "FOLATE", "FERRITIN", "TIBC", "IRON", "RETICCTPCT" in the last 72 hours. Urine analysis:    Component Value Date/Time    COLORURINE YELLOW 11/17/2019 1608   APPEARANCEUR CLOUDY (A) 11/17/2019 1608   LABSPEC 1.006 11/17/2019 1608   PHURINE 6.0 11/17/2019 1608   GLUCOSEU 50 (A) 11/17/2019 1608   HGBUR NEGATIVE 11/17/2019 1608   BILIRUBINUR NEGATIVE 11/17/2019 1608   KETONESUR NEGATIVE 11/17/2019 1608   PROTEINUR NEGATIVE 11/17/2019 1608   NITRITE POSITIVE (A) 11/17/2019 1608   LEUKOCYTESUR LARGE (A) 11/17/2019 1608   Sepsis Labs: @LABRCNTIP (procalcitonin:4,lacticidven:4)  )No results found for this or any previous visit (from the past 240 hour(s)).    Radiology Studies: DG Chest 2 View  Result Date: 03/01/2022 CLINICAL DATA:  Chest pain EXAM: CHEST - 2 VIEW COMPARISON:  12/17/2013 FINDINGS: The heart size and mediastinal contours are within normal limits. Aortic atherosclerosis. Both lungs are clear. The visualized skeletal structures are unremarkable. IMPRESSION: No active cardiopulmonary disease. Electronically Signed   By: Donavan Foil M.D.   On: 03/01/2022 17:13     Scheduled Meds:  gabapentin  400 mg Oral TID   LORazepam  0-4 mg Oral Q6H   [START ON 03/04/2022] LORazepam  2 mg Oral Q12H   Or   [START ON 03/04/2022] LORazepam  0-4 mg Oral Q12H   nicotine  21 mg Transdermal Daily   pilocarpine  5 mg Oral BID   thiamine  100 mg Oral Daily   Or   thiamine  100 mg Intravenous Daily   Continuous Infusions:   LOS: 2 days   Loistine Chance, MD [facility]

## 2022-03-04 DIAGNOSIS — I2510 Atherosclerotic heart disease of native coronary artery without angina pectoris: Secondary | ICD-10-CM | POA: Diagnosis not present

## 2022-03-04 DIAGNOSIS — E860 Dehydration: Secondary | ICD-10-CM | POA: Diagnosis not present

## 2022-03-04 DIAGNOSIS — F1010 Alcohol abuse, uncomplicated: Secondary | ICD-10-CM | POA: Diagnosis not present

## 2022-03-04 DIAGNOSIS — E876 Hypokalemia: Secondary | ICD-10-CM | POA: Diagnosis not present

## 2022-03-04 LAB — CBC
HCT: 40.2 % (ref 36.0–46.0)
Hemoglobin: 13.3 g/dL (ref 12.0–15.0)
MCH: 35 pg — ABNORMAL HIGH (ref 26.0–34.0)
MCHC: 33.1 g/dL (ref 30.0–36.0)
MCV: 105.8 fL — ABNORMAL HIGH (ref 80.0–100.0)
Platelets: 166 10*3/uL (ref 150–400)
RBC: 3.8 MIL/uL — ABNORMAL LOW (ref 3.87–5.11)
RDW: 14.1 % (ref 11.5–15.5)
WBC: 6.7 10*3/uL (ref 4.0–10.5)
nRBC: 0 % (ref 0.0–0.2)

## 2022-03-04 LAB — COMPREHENSIVE METABOLIC PANEL
ALT: 14 U/L (ref 0–44)
AST: 39 U/L (ref 15–41)
Albumin: 2.3 g/dL — ABNORMAL LOW (ref 3.5–5.0)
Alkaline Phosphatase: 88 U/L (ref 38–126)
Anion gap: 5 (ref 5–15)
BUN: 6 mg/dL (ref 6–20)
CO2: 25 mmol/L (ref 22–32)
Calcium: 7.6 mg/dL — ABNORMAL LOW (ref 8.9–10.3)
Chloride: 102 mmol/L (ref 98–111)
Creatinine, Ser: 0.57 mg/dL (ref 0.44–1.00)
GFR, Estimated: >60 mL/min (ref >60)
Glucose, Bld: 95 mg/dL (ref 70–99)
Potassium: 3.7 mmol/L (ref 3.5–5.1)
Sodium: 132 mmol/L — ABNORMAL LOW (ref 135–145)
Total Bilirubin: 1.1 mg/dL (ref 0.3–1.2)
Total Protein: 5.8 g/dL — ABNORMAL LOW (ref 6.5–8.1)

## 2022-03-04 LAB — MAGNESIUM: Magnesium: 2.1 mg/dL (ref 1.7–2.4)

## 2022-03-04 MED ORDER — ENOXAPARIN SODIUM 40 MG/0.4ML IJ SOSY
40.0000 mg | PREFILLED_SYRINGE | INTRAMUSCULAR | Status: DC
  Filled 2022-03-04: qty 0.4

## 2022-03-04 MED ORDER — FUROSEMIDE 40 MG PO TABS
40.0000 mg | ORAL_TABLET | Freq: Every day | ORAL | Status: DC
  Administered 2022-03-05: 40 mg via ORAL
  Filled 2022-03-04: qty 1

## 2022-03-04 MED ORDER — POTASSIUM CHLORIDE CRYS ER 20 MEQ PO TBCR
40.0000 meq | EXTENDED_RELEASE_TABLET | Freq: Once | ORAL | Status: AC
  Administered 2022-03-04: 40 meq via ORAL
  Filled 2022-03-04: qty 2

## 2022-03-04 MED ORDER — SPIRONOLACTONE 25 MG PO TABS
50.0000 mg | ORAL_TABLET | Freq: Every day | ORAL | Status: DC
  Administered 2022-03-05: 50 mg via ORAL
  Filled 2022-03-04: qty 2

## 2022-03-04 NOTE — Progress Notes (Signed)
Pt refused SQ lovenox today. Patient educated on risks/benefits. She did ambulate down hall 2x today & around the room a few times.

## 2022-03-04 NOTE — Consult Note (Signed)
Referring Provider: Dr. Tyson Babinski Primary Care Physician:  [name], MD Primary Gastroenterologist:  Gentry Fitz  Reason for Consultation:  Ascites; Cirrhosis  HPI: Lynn Harvey is a 57 y.o. female with alcoholic cirrhosis seen for a consult due to abdominal pain with decompensated cirrhosis with ascites. Paracentesis yesterday with 1.6 L removed with 251 WBCs with 3% PMNs. She was started on Ceftriaxone. Feels like abdomen is becoming distended again and feels bloated. Denies abdominal pain. EGD/colon by Dr. Conley Rolls in [facility] [id number]) that was negative for esophageal varices and showed gastric AVMs and a colonic AVM along with a benign colonic polyp (office note from 03/01/22 [facility]) and was sent to hospital with low potassium (K 2.3). Son and daughter in room.  Past Medical History:  Diagnosis Date   Acute sensory neuropathy 09/18/2019   ADD (attention deficit disorder)    Allergy    Anemia    Anxiety    Coronary artery disease    a. s/p NSTEMI in 2015 with angioplasty alone to mid-LAD, repeat cath within the same month showing a patent site   Depression    Depression    Fatty liver    GAD (generalized anxiety disorder)    GERD (gastroesophageal reflux disease)    Gestational diabetes    History of heart attack    Hyperlipidemia    Hypertension    Insomnia    Lupus (HCC)    [place]    Myocardial infarction (HCC) 06/2013   Rectal abnormality    Sicca syndrome (HCC)    Sjogren's syndrome (HCC)    Telangiectasia    Vitamin D deficiency     Past Surgical History:  Procedure Laterality Date   ABDOMINAL HYSTERECTOMY     fibroids, endometrosis.    CHOLECYSTECTOMY  08/2018   COLON SURGERY     Flexible Sigmoidoscopy   COLONOSCOPY N/A 01/08/2014   Procedure: COLONOSCOPY;  Surgeon: Theda Belfast, MD;  Location: [facility];  Service: Endoscopy;  Laterality: N/A;   ESOPHAGOGASTRODUODENOSCOPY N/A 01/08/2014   Procedure: ESOPHAGOGASTRODUODENOSCOPY (EGD);  Surgeon: Theda Belfast,  MD;  Location: [facility];  Service: Endoscopy;  Laterality: N/A;   LEFT HEART CATHETERIZATION WITH CORONARY ANGIOGRAM N/A 06/24/2013   Procedure: LEFT HEART CATHETERIZATION WITH CORONARY ANGIOGRAM;  Surgeon: [name], MD;  Location: [facility] CATH LAB;  Service: Cardiovascular;  Laterality: N/A;   LEFT HEART CATHETERIZATION WITH CORONARY ANGIOGRAM N/A 07/17/2013   Procedure: LEFT HEART CATHETERIZATION WITH CORONARY ANGIOGRAM;  Surgeon: Kathleene Hazel, MD;  Location: [facility] CATH LAB;  Service: Cardiovascular;  Laterality: N/A;   TONSILECTOMY, ADENOIDECTOMY, BILATERAL MYRINGOTOMY AND TUBES     WRIST SURGERY  1987   RIGHT; bone from forearm grafted to wrist    Prior to Admission medications   Medication Sig Start Date End Date Taking? Authorizing Provider  amLODipine (NORVASC) 5 MG tablet TAKE 1 TABLET BY MOUTH EVERY DAY Patient taking differently: Take 5 mg by mouth daily. 01/16/21  Yes [name], MD  amphetamine-dextroamphetamine (ADDERALL) 15 MG tablet Take 0.5 tablets by mouth 2 (two) times daily. 05/17/14  Yes Porfirio Oar, PA  aspirin EC 81 MG tablet Take 81 mg by mouth daily.   Yes [provider]  ezetimibe (ZETIA) 10 MG tablet TAKE 1 TABLET(10 MG) BY MOUTH DAILY Patient taking differently: Take 10 mg by mouth daily. 12/25/21  Yes [name], MD  folic acid (FOLVITE) 1 MG tablet Take 1 tablet (1 mg total) by mouth daily. 11/20/19  Yes Rodolph Bong, MD  gabapentin (NEURONTIN) 400 MG capsule TAKE 1 CAPSULE(400 MG) BY MOUTH THREE TIMES DAILY Patient taking differently: Take 400 mg by mouth 3 (three) times daily. 01/22/22  Yes Butch Penny, NP  metoprolol tartrate (LOPRESSOR) 25 MG tablet Take 1 tablet (25 mg total) by mouth 2 (two) times daily. 11/19/19  Yes Rodolph Bong, MD  Multiple Vitamin (MULTIVITAMIN WITH MINERALS) TABS tablet Take 1 tablet by mouth daily. 09/22/19  Yes Azucena Fallen, MD  nitroGLYCERIN (NITROSTAT) 0.4 MG SL tablet DISSOLVE 1  TABLET UNDER THE TONGUE EVERY 5 MINUTES AS NEEDED FOR CHEST PAIN Patient taking differently: Place 0.4 mg under the tongue every 5 (five) minutes as needed for chest pain. 02/06/21  Yes [name], MD  omeprazole (PRILOSEC) 20 MG capsule Take 20 mg by mouth 2 (two) times daily before a meal.   Yes [provider]  ondansetron (ZOFRAN) 4 MG tablet Take 1 tablet (4 mg total) by mouth every 8 (eight) hours as needed for nausea or vomiting. 07/12/18  Yes Fayrene Helper, PA-C  pilocarpine (SALAGEN) 5 MG tablet Take 5 mg by mouth 3 (three) times daily.  08/18/15  Yes [provider]  pyridOXINE (B-6) 100 MG tablet Take 1 tablet (100 mg total) by mouth daily. 11/19/19  Yes Rodolph Bong, MD  thiamine 100 MG tablet Take 1 tablet (100 mg total) by mouth daily. 11/20/19  Yes Rodolph Bong, MD  valACYclovir (VALTREX) 500 MG tablet Take 500 mg by mouth 3 (three) times daily as needed (fever blister).   Yes [provider]  zolpidem (AMBIEN) 5 MG tablet Take 5 mg by mouth at bedtime as needed for sleep. 12/07/14  Yes [provider]  acetaminophen-codeine (TYLENOL #3) 300-30 MG tablet Take 1 tablet by mouth every 8 (eight) hours as needed for moderate pain.  Patient not taking: Reported on 03/02/2022 11/03/19   [provider]  Immune Globulin, Human, (GAMMAGARD) 20 GM/200ML SOLN Infused thru [facility] Patient taking differently: Inject into the vein See admin instructions. Every 4 weeks 06/29/21   Anson Fret, MD    Scheduled Meds:  enoxaparin (LOVENOX) injection  40 mg Subcutaneous Q24H   furosemide  20 mg Oral Daily   gabapentin  400 mg Oral TID   LORazepam  2 mg Oral Q12H   Or   LORazepam  0-4 mg Oral Q12H   nicotine  21 mg Transdermal Daily   pilocarpine  5 mg Oral BID   spironolactone  25 mg Oral Daily   thiamine  100 mg Oral Daily   Or   thiamine  100 mg Intravenous Daily   Continuous Infusions:  cefTRIAXone (ROCEPHIN)  IV 2 g (03/03/22  1800)   PRN Meds:.ALPRAZolam, ondansetron **OR** ondansetron (ZOFRAN) IV, mouth rinse, witch hazel-glycerin  Allergies as of 03/01/2022 - Review Complete 03/01/2022  Allergen Reaction Noted   Buprenorphine hcl Nausea And Vomiting 07/19/2015   Doxycycline Nausea Only 09/02/2019   Morphine and related Nausea And Vomiting 06/24/2013   Ace inhibitors Cough 07/19/2015    Family History  Problem Relation Age of Onset   COPD Mother        emphysema   Lung cancer Mother    Heart disease Father 19       s/p 3V CABG   Depression Father    Barrett's esophagus Father    Mental illness Sister        depression   Depression Sister  Multiple sclerosis Sister    Lung cancer Maternal Aunt    Diabetes Maternal Grandmother    Heart disease Maternal Grandmother    Diabetes Maternal Grandfather    Cancer Maternal Grandfather        lung cancer   Diabetes Paternal Grandmother    Kidney disease Paternal Grandmother    Diabetes Paternal Grandfather    Heart disease Paternal Grandfather    Mental illness Son        ADHD, bipolar disorder   Neuropathy Neg Hx     Social History   Socioeconomic History   Marital status: Married    Spouse name: Barbaraann Rondo   Number of children: 3   Years of education: 12   Highest education level: Not on file  Occupational History   Occupation: CUSTOMER SERVICE    Employer: [employer]  Tobacco Use   Smoking status: Every Day    Packs/day: 1.00    Years: 20.00    Total pack years: 20.00    Types: Cigarettes    Start date: 12/25/1982   Smokeless tobacco: Never   Tobacco comments:    09/03/19 < 1.5 ppd  Vaping Use   Vaping Use: Never used  Substance and Sexual Activity   Alcohol use: Not Currently    Comment: Drank 24 cans/week until September 2021   Drug use: No   Sexual activity: Yes    Partners: Male    Birth control/protection: Surgical  Other Topics Concern   Not on file  Social History Narrative   Lives with her husband, their son  Yong Channel, and her daughter Museum/gallery conservator and Amber's son [name].   Social Determinants of Health   Financial Resource Strain: Not on file  Food Insecurity: Not on file  Transportation Needs: Not on file  Physical Activity: Not on file  Stress: Not on file  Social Connections: Not on file  Intimate Partner Violence: Not on file    Review of Systems: All negative except as stated above in HPI.  Physical Exam: Vital signs: Vitals:   03/04/22 0451 03/04/22 1233  BP: 111/72 109/67  Pulse: 85 89  Resp: 18 18  Temp: 98.1 F (36.7 C) 98.5 F (36.9 C)  SpO2: 95% 95%   Last BM Date : 03/03/22 General:   Lethargic, well-nourished, no acute distress  Head: normocephalic, atraumatic Eyes: anicteric sclera ENT: oropharynx clear Neck: supple, nontender Lungs:  Clear throughout to auscultation.   No wheezes, crackles, or rhonchi. No acute distress. Heart:  Regular rate and rhythm; no murmurs, clicks, rubs,  or gallops. Abdomen: soft, nontender, mild distention, +BS  Rectal:  Deferred Ext: no edema  GI:  Lab Results: Recent Labs    03/01/22 1640 03/02/22 0646 03/04/22 0550  WBC 9.6 6.5 6.7  HGB 14.7 12.7 13.3  HCT 42.7 37.6 40.2  PLT 211 163 166   BMET Recent Labs    03/02/22 0646 03/03/22 0648 03/04/22 0550  NA 136 136 132*  K 2.9* 4.1 3.7  CL 99 101 102  CO2 29 26 25   GLUCOSE 98 85 95  BUN <5* 5* 6  CREATININE 0.56 0.54 0.57  CALCIUM 7.6* 8.0* 7.6*   LFT Recent Labs    03/04/22 0550  PROT 5.8*  ALBUMIN 2.3*  AST 39  ALT 14  ALKPHOS 88  BILITOT 1.1   PT/INR Recent Labs    03/01/22 1640  LABPROT 15.3*  INR 1.2     Studies/Results: US Paracentesis  Result Date: 03/03/2022 INDICATION: 57 year old  female with alcoholic cirrhosis presents with abdominal distension. Request for therapeutic and diagnostic paracentesis. EXAM: ULTRASOUND GUIDED PARACENTESIS MEDICATIONS: 8 mL 1% lidocaine COMPLICATIONS: None immediate. PROCEDURE: Informed written consent was  obtained from the patient after a discussion of the risks, benefits and alternatives to treatment. A timeout was performed prior to the initiation of the procedure. Initial ultrasound scanning demonstrates a small amount of ascites within the right lower abdominal quadrant. The right lower abdomen was prepped and draped in the usual sterile fashion. 1% lidocaine was used for local anesthesia. Following this, a 19 gauge, 7-cm, Yueh catheter was introduced. An ultrasound image was saved for documentation purposes. The paracentesis was performed. The catheter was removed and a dressing was applied. The patient tolerated the procedure well without immediate post procedural complication. FINDINGS: A total of approximately 1.6 of hazy yellow fluid was removed. Samples were sent to the laboratory as requested by the clinical team. IMPRESSION: Successful ultrasound-guided paracentesis yielding 1.6 liters of peritoneal fluid. PLAN: If the patient eventually requires >/=2 paracenteses in a 30 day period, candidacy for formal evaluation by the [facility] will be assessed. Read by: Durenda Guthrie, PA-C Electronically Signed   By: Jerilynn Mages.  Shick M.D.   On: 03/03/2022 12:00    Impression/Plan: Decompensated cirrhosis with ascites - Neutrophil count in ascitic fluid low so fluid not consistent with spontaneous bacterial peritonitis. Would increase diuretics to Lasix 40 mg and Aldactone 100 mg per day if potassium level remains normal and stop IV Abx from ascites standpoint but defer these changes to Dr. Louanne Belton. Strongly encouraged to stop all alcohol intake. Changed to low sodium diet. D/C tomorrow from GI standpoint if stable. Continue to f/u with Dr. Marin Comment at [facility].    LOS: 3 days   Lear Ng  03/04/2022, 3:37 PM  Questions please call [phone]

## 2022-03-04 NOTE — Progress Notes (Signed)
PROGRESS NOTE    Lynn Harvey  IPJ:[id number] DOB: Nov 11, 1965 DOA: 03/01/2022 PCP: [name], MD   Brief Narrative:   Patient is a 57 year old female with past medical history of alcohol abuse, cirrhosis of liver and ascites was seen by her GI team and was noted to have potassium of 2.3 she was sent to the hospital for further evaluation and treatment.  Lab work done in the ED showed potassium of 2.9 with albumin of 2.2 INR was 1.2.  Patient was then admitted hospital for further evaluation and treatment.    During hospitalization, patient continued to have abdominal pain and distention with poor appetite so ultrasound-guided paracentesis was done with removal of fluids.  Subsequently patient had improved appetite but peritoneal fluid was cloudy with elevated nucleated cells.  Has been started on IV Rocephin.  GI has been consulted at this time.  Assessment & Plan:   Principal Problem:   Hypokalemia Active Problems:   Dehydration with hyponatremia   Alcohol abuse   Tobacco abuse   Essential hypertension   Coronary artery disease involving native coronary artery of native heart without angina pectoris   Gastroesophageal reflux disease without esophagitis   Sjogren's syndrome (HCC)   Alcoholic hepatitis   Severe hypokalemia: -Potassium of 2.3 on presentation.  Received potassium aggressively.  Potassium today at 4.1.  Will monitor closely and replenish as necessary.  Alcoholic liver disease with cirrhosis and ascites, coagulopathy: Last alcohol use was 2 days prior to presentation.  Has history of ascites.  Counseled on quitting alcohol. Continue CIWA protocol.  No obvious signs of withdrawal at this time.  She is motivated to quit.  TOC has been consulted for substance abuse.  Due to abdominal discomfort and ascites patient underwent ultrasound-guided paracentesis on 03/03/2022 with with removal of 1.6 L of hazy peritoneal fluid.  Gram stain pending but nucleated cell count at  251.  Patient has been started on Rocephin IV empirically for possible SBP and feels better today with improved oral intake.  Has been started on.  Will add low-dose spironolactone and Lasix.  Intake and output charting Daily weights.  Abdominal pain, ascites, peritoneal fluid count at 251.  Possibility of spontaneous bacterial peritonitis.  Will put the patient on IV Rocephin.  Follow culture and sensitivity.  No evidence of encephalopathy at this time.  Have spoken with GI for consultation.  Will follow recommendations.  Tobacco abuse: Continue nicotine patch.  Coronary artery disease: Stable.  No acute issues.  Denies chest pain.  Hypoalbuminemia: Secondary diabetes and poor oral intake.  Hyponatremia: Mild hyponatremia improved.  Sodium today at 132  Moderate to severe malnutrition: Present on admission.  Nutrition services has been consulted  DVT prophylaxis: SCD.  Will add Lovenox subcu.  Code Status: Full code  Family Communication: None at bedside.  Disposition Plan: Home likely in 2 to 3 days   Consultants:  Interventional radiology  Procedures:  Ultrasound-guided paracentesis on 03/03/2022  Antimicrobials:  Rocephin IV   Subjective: Today, patient was seen and examined at bedside.  Feels a little better today with her appetite.  Less abdominal pain.  Denies any nausea vomiting fever chills hallucinations tremors or signs of withdrawal.    Objective: Vitals:   03/03/22 1224 03/03/22 2048 03/04/22 0451 03/04/22 1233  BP: 118/75 130/81 111/72 109/67  Pulse: 96 93 85 89  Resp: 18 16 18 18   Temp: 98 F (36.7 C) 99.1 F (37.3 C) 98.1 F (36.7 C) 98.5 F (36.9 C)  TempSrc: Oral Oral Oral Oral  SpO2: 96% 95% 95% 95%    Intake/Output Summary (Last 24 hours) at 03/04/2022 1436 Last data filed at 03/04/2022 0900 Gross per 24 hour  Intake 270.11 ml  Output 300 ml  Net -29.89 ml    There were no vitals filed for this visit.  Physical examination: There is  no height or weight on file to calculate BMI.   General: Alert awake and Communicative, much calmer today, HENT:   No scleral pallor or icterus noted. Oral mucosa is moist.  Chest:  Clear breath sounds.  Diminished breath sounds bilaterally. No crackles or wheezes.  CVS: S1 &S2 heard. No murmur.  Regular rate and rhythm. Abdomen: Soft, nontender, slight fullness of the flanks, bowel sounds present, extremities: No cyanosis, clubbing with bilateral trace pedal edema.  Peripheral pulses are palpable. Psych: Alert, awake and oriented, CNS:  No cranial nerve deficits.  Power equal in all extremities.   Skin: Warm and dry.  No rashes noted.  Data Reviewed: I have personally reviewed following labs and imaging studies  CBC: Recent Labs  Lab 03/01/22 1640 03/02/22 0646 03/04/22 0550  WBC 9.6 6.5 6.7  HGB 14.7 12.7 13.3  HCT 42.7 37.6 40.2  MCV 100.2* 102.7* 105.8*  PLT 211 163 767    Basic Metabolic Panel: Recent Labs  Lab 03/01/22 1640 03/02/22 0646 03/02/22 1613 03/03/22 0648 03/04/22 0550  NA 134* 136  --  136 132*  K 2.3* 2.9*  --  4.1 3.7  CL 87* 99  --  101 102  CO2 34* 29  --  26 25  GLUCOSE 91 98  --  85 95  BUN <5* <5*  --  5* 6  CREATININE 0.61 0.56  --  0.54 0.57  CALCIUM 8.3* 7.6*  --  8.0* 7.6*  MG 1.8  --  1.7  --  2.1    GFR: CrCl cannot be calculated (Unknown ideal weight.). Liver Function Tests: Recent Labs  Lab 03/01/22 1640 03/02/22 0646 03/03/22 0648 03/04/22 0550  AST 52* 36 39 39  ALT 20 16 15 14   ALKPHOS 121 91 102 88  BILITOT 1.8* 1.8* 1.8* 1.1  PROT 7.3 5.7* 6.2* 5.8*  ALBUMIN 2.9* 2.2* 2.5* 2.3*    No results for input(s): "LIPASE", "AMYLASE" in the last 168 hours. No results for input(s): "AMMONIA" in the last 168 hours. Coagulation Profile: Recent Labs  Lab 03/01/22 1640  INR 1.2    Cardiac Enzymes: No results for input(s): "CKTOTAL", "CKMB", "CKMBINDEX", "TROPONINI" in the last 168 hours. BNP (last 3 results) No results  for input(s): "PROBNP" in the last 8760 hours. HbA1C: No results for input(s): "HGBA1C" in the last 72 hours. CBG: No results for input(s): "GLUCAP" in the last 168 hours. Lipid Profile: No results for input(s): "CHOL", "HDL", "LDLCALC", "TRIG", "CHOLHDL", "LDLDIRECT" in the last 72 hours. Thyroid Function Tests: No results for input(s): "TSH", "T4TOTAL", "FREET4", "T3FREE", "THYROIDAB" in the last 72 hours. Anemia Panel: No results for input(s): "VITAMINB12", "FOLATE", "FERRITIN", "TIBC", "IRON", "RETICCTPCT" in the last 72 hours. Urine analysis:    Component Value Date/Time   COLORURINE YELLOW 11/17/2019 1608   APPEARANCEUR CLOUDY (A) 11/17/2019 1608   LABSPEC 1.006 11/17/2019 1608   PHURINE 6.0 11/17/2019 1608   GLUCOSEU 50 (A) 11/17/2019 1608   HGBUR NEGATIVE 11/17/2019 1608   BILIRUBINUR NEGATIVE 11/17/2019 [place] 11/17/2019 1608   PROTEINUR NEGATIVE 11/17/2019 1608   NITRITE POSITIVE (A) 11/17/2019 1608  LEUKOCYTESUR LARGE (A) 11/17/2019 1608   Sepsis Labs: @LABRCNTIP (procalcitonin:4,lacticidven:4)  ) Recent Results (from the past 240 hour(s))  Aerobic/Anaerobic Culture w Gram Stain (surgical/deep wound)     Status: None (Preliminary result)   Collection Time: 03/03/22 11:01 AM   Specimen: PATH Cytology Peritoneal fluid  Result Value Ref Range Status   Specimen Description   Final    PERITONEAL Performed at [facility], [address] [place], [place]    Special Requests   Final    NONE Performed at [facility], [address] [place], [place]    Gram Stain NO ORGANISMS SEEN NO WBC SEEN   Final   Culture   Final    NO GROWTH < 24 HOURS Performed at [facility], [address], [place], [place]    Report Status PENDING  Incomplete  Aerobic Culture w Gram Stain (superficial specimen)     Status: None (Preliminary result)   Collection Time: 03/03/22 11:01 AM    Specimen: PATH Cytology Peritoneal fluid  Result Value Ref Range Status   Specimen Description   Final    PERITONEAL Performed at [facility], [address] [place], [place]    Special Requests   Final    NONE Performed at [facility], [address] [place], [place]    Gram Stain NO ORGANISMS SEEN NO WBC SEEN   Final   Culture   Final    NO GROWTH < 24 HOURS Performed at [facility], [address], [place], [place]    Report Status PENDING  Incomplete  Gram stain     Status: None   Collection Time: 03/03/22 11:01 AM   Specimen: Fluid  Result Value Ref Range Status   Specimen Description FLUID  Final   Special Requests NONE  Final   Gram Stain   Final    RARE WBC SEEN NO ORGANISMS SEEN Performed at [facility], [address] [place], [place]    Report Status 03/03/2022 FINAL  Final  Culture, body fluid w Gram Stain-bottle     Status: None (Preliminary result)   Collection Time: 03/03/22 11:01 AM   Specimen: Fluid  Result Value Ref Range Status   Specimen Description FLUID  Final   Special Requests NONE  Final   Culture   Final    NO GROWTH < 12 HOURS Performed at [facility], [address], [place], [place]    Report Status PENDING  Incomplete      Radiology Studies: [id number] Paracentesis  Result Date: 03/03/2022 INDICATION: 57 year old female with alcoholic cirrhosis presents with abdominal distension. Request for therapeutic and diagnostic paracentesis. EXAM: ULTRASOUND GUIDED PARACENTESIS MEDICATIONS: 8 mL 1% lidocaine COMPLICATIONS: None immediate. PROCEDURE: Informed written consent was obtained from the patient after a discussion of the risks, benefits and alternatives to treatment. A timeout was performed prior to the initiation of the procedure. Initial ultrasound scanning demonstrates a small amount of ascites within the right lower abdominal  quadrant. The right lower abdomen was prepped and draped in the usual sterile fashion. 1% lidocaine was used for local anesthesia. Following this, a 19 gauge, 7-cm, Yueh catheter was introduced. An ultrasound image was saved for documentation purposes. The paracentesis was performed. The catheter was removed and a dressing was applied. The patient tolerated the procedure well without immediate post procedural complication. FINDINGS: A total  of approximately 1.6 of hazy yellow fluid was removed. Samples were sent to the laboratory as requested by the clinical team. IMPRESSION: Successful ultrasound-guided paracentesis yielding 1.6 liters of peritoneal fluid. PLAN: If the patient eventually requires >/=2 paracenteses in a 30 day period, candidacy for formal evaluation by the [facility] will be assessed. Read by: Durenda Guthrie, PA-C Electronically Signed   By: Jerilynn Mages.  Shick M.D.   On: 03/03/2022 12:00     Scheduled Meds:  furosemide  20 mg Oral Daily   gabapentin  400 mg Oral TID   LORazepam  2 mg Oral Q12H   Or   LORazepam  0-4 mg Oral Q12H   nicotine  21 mg Transdermal Daily   pilocarpine  5 mg Oral BID   spironolactone  25 mg Oral Daily   thiamine  100 mg Oral Daily   Or   thiamine  100 mg Intravenous Daily   Continuous Infusions:  cefTRIAXone (ROCEPHIN)  IV 2 g (03/03/22 1800)     LOS: 3 days   Oscar La, MD [facility]

## 2022-03-05 ENCOUNTER — Encounter (HOSPITAL_COMMUNITY): Payer: Self-pay | Admitting: Internal Medicine

## 2022-03-05 ENCOUNTER — Other Ambulatory Visit: Payer: Self-pay

## 2022-03-05 DIAGNOSIS — I2510 Atherosclerotic heart disease of native coronary artery without angina pectoris: Secondary | ICD-10-CM | POA: Diagnosis not present

## 2022-03-05 DIAGNOSIS — E876 Hypokalemia: Secondary | ICD-10-CM | POA: Diagnosis not present

## 2022-03-05 DIAGNOSIS — F1010 Alcohol abuse, uncomplicated: Secondary | ICD-10-CM | POA: Diagnosis not present

## 2022-03-05 DIAGNOSIS — K7031 Alcoholic cirrhosis of liver with ascites: Secondary | ICD-10-CM

## 2022-03-05 DIAGNOSIS — E860 Dehydration: Secondary | ICD-10-CM | POA: Diagnosis not present

## 2022-03-05 LAB — COMPREHENSIVE METABOLIC PANEL
ALT: 16 U/L (ref 0–44)
AST: 50 U/L — ABNORMAL HIGH (ref 15–41)
Albumin: 2.5 g/dL — ABNORMAL LOW (ref 3.5–5.0)
Alkaline Phosphatase: 82 U/L (ref 38–126)
Anion gap: 6 (ref 5–15)
BUN: 5 mg/dL — ABNORMAL LOW (ref 6–20)
CO2: 27 mmol/L (ref 22–32)
Calcium: 8.3 mg/dL — ABNORMAL LOW (ref 8.9–10.3)
Chloride: 103 mmol/L (ref 98–111)
Creatinine, Ser: 0.62 mg/dL (ref 0.44–1.00)
GFR, Estimated: >60 mL/min (ref >60)
Glucose, Bld: 102 mg/dL — ABNORMAL HIGH (ref 70–99)
Potassium: 3.9 mmol/L (ref 3.5–5.1)
Sodium: 136 mmol/L (ref 135–145)
Total Bilirubin: 1.3 mg/dL — ABNORMAL HIGH (ref 0.3–1.2)
Total Protein: 5.8 g/dL — ABNORMAL LOW (ref 6.5–8.1)

## 2022-03-05 LAB — MAGNESIUM: Magnesium: 1.9 mg/dL (ref 1.7–2.4)

## 2022-03-05 LAB — CBC
HCT: 40.4 % (ref 36.0–46.0)
Hemoglobin: 13.4 g/dL (ref 12.0–15.0)
MCH: 34.8 pg — ABNORMAL HIGH (ref 26.0–34.0)
MCHC: 33.2 g/dL (ref 30.0–36.0)
MCV: 104.9 fL — ABNORMAL HIGH (ref 80.0–100.0)
Platelets: 176 10*3/uL (ref 150–400)
RBC: 3.85 MIL/uL — ABNORMAL LOW (ref 3.87–5.11)
RDW: 14 % (ref 11.5–15.5)
WBC: 6.9 10*3/uL (ref 4.0–10.5)
nRBC: 0 % (ref 0.0–0.2)

## 2022-03-05 MED ORDER — SPIRONOLACTONE 50 MG PO TABS: 50.0000 mg | ORAL_TABLET | Freq: Every evening | ORAL | 0 refills | Status: AC

## 2022-03-05 MED ORDER — VITAMIN B-1 100 MG PO TABS: 100.0000 mg | ORAL_TABLET | Freq: Every day | ORAL | 0 refills | Status: AC

## 2022-03-05 MED ORDER — METOPROLOL TARTRATE 25 MG PO TABS: 12.5000 mg | ORAL_TABLET | Freq: Two times a day (BID) | ORAL | 2 refills | Status: AC

## 2022-03-05 MED ORDER — FUROSEMIDE 40 MG PO TABS: 40.0000 mg | ORAL_TABLET | Freq: Every morning | ORAL | 0 refills | Status: AC

## 2022-03-05 NOTE — Progress Notes (Signed)
[facility] Gastroenterology Progress Note  Lynn Harvey 57 y.o. [birth year]   Subjective: Feels good. Denies abdominal pain. Eating low sodium diet. Family in room.  Objective: Vital signs: Vitals:   03/04/22 2046 03/05/22 0522  BP: 122/68 126/70  Pulse: 93 83  Resp: 20 20  Temp: 99.8 F (37.7 C) 98.8 F (37.1 C)  SpO2: 97% 95%    Physical Exam: Gen: alert, no acute distress, pleasant, well-nourished  HEENT: anicteric sclera CV: RRR Chest: CTA B Abd: soft, nontender, nondistended, +BS Ext: no edema  Lab Results: Recent Labs    03/04/22 0550 03/05/22 0700  NA 132* 136  K 3.7 3.9  CL 102 103  CO2 25 27  GLUCOSE 95 102*  BUN 6 5*  CREATININE 0.57 0.62  CALCIUM 7.6* 8.3*  MG 2.1 1.9   Recent Labs    03/04/22 0550 03/05/22 0700  AST 39 50*  ALT 14 16  ALKPHOS 88 82  BILITOT 1.1 1.3*  PROT 5.8* 5.8*  ALBUMIN 2.3* 2.5*   Recent Labs    03/04/22 0550 03/05/22 0700  WBC 6.7 6.9  HGB 13.3 13.4  HCT 40.2 40.4  MCV 105.8* 104.9*  PLT 166 176      Assessment/Plan: Decompensated cirrhosis - Ascites negative for SBP. Needs close f/u with Dr. Marin Comment for management of ascites with diuretics. Being discharged on Lasix 40 mg/day and Spironolactone 50 mg/day. Patient says she is stopping drinking because "I don't want to die." Agree with D/C home.    Lynn Harvey 03/05/2022, 11:08 AM  Questions please call [phone] ID: Lynn Harvey, female   DOB: 1965-03-18, 57 y.o.   MRN: [id number]

## 2022-03-05 NOTE — TOC Initial Note (Signed)
Transition of Care [facility]) - Initial/Assessment Note    Patient Details  Name: Lynn Harvey MRN: [id number] Date of Birth: 27-Apr-1965  [facility]) CM/SW Contact:    Leeroy Cha, RN Phone Number: 03/05/2022, 8:45 AM  Clinical Narrative:                 Smoking cessation information added to the dc instructions.  Expected Discharge Plan: Home/Self Care Barriers to Discharge: Continued Medical Work up   Patient Goals and CMS Choice Patient states their goals for this hospitalization and ongoing recovery are:: to go home CMS Medicare.gov Compare Post Acute Care list provided to:: Patient        Expected Discharge Plan and Services   Discharge Planning Services: CM Consult   Living arrangements for the past 2 months: Single Family Home                                      Prior Living Arrangements/Services Living arrangements for the past 2 months: Single Family Home Lives with:: Spouse Patient language and need for interpreter reviewed:: Yes Do you feel safe going back to the place where you live?: Yes            Criminal Activity/Legal Involvement Pertinent to Current Situation/Hospitalization: No - Comment as needed  Activities of Daily Living Home Assistive Devices/Equipment: None ADL Screening (condition at time of admission) Patient's cognitive ability adequate to safely complete daily activities?: Yes Is the patient deaf or have difficulty hearing?: No Does the patient have difficulty seeing, even when wearing glasses/contacts?: No Does the patient have difficulty concentrating, remembering, or making decisions?: No Patient able to express need for assistance with ADLs?: Yes Does the patient have difficulty dressing or bathing?: No Independently performs ADLs?: Yes (appropriate for developmental age) Does the patient have difficulty walking or climbing stairs?: No Weakness of Legs: None Weakness of Arms/Hands: None  Permission  Sought/Granted                  Emotional Assessment Appearance:: Appears stated age Attitude/Demeanor/Rapport: Engaged Affect (typically observed): Appropriate Orientation: : Oriented to Situation, Oriented to  Time, Oriented to Place, Oriented to Self Alcohol / Substance Use: Tobacco Use Psych Involvement: No (comment)  Admission diagnosis:  Hypokalemia [E87.6] Patient Active Problem List   Diagnosis Date Noted   Hypokalemia 84/13/2440   Alcoholic hepatitis 12/16/2534   E. 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   Bilateral carpal tunnel syndrome 10/01/2019   Ulnar neuropathy at elbow, right 10/01/2019   Paresthesias 10/01/2019   Acute sensory neuropathy 09/18/2019   Complex ovarian cyst 10/12/2016   Presence of granulation tissue 10/12/2016   Hemorrhagic cyst of ovary 10/12/2016   Edema 08/24/2016   Drug therapy 12/20/2015   Sjogren's syndrome ([place]) 12/20/2015   Elevated liver function tests 12/19/2015   History of myocardial infarction 12/06/2015   ANA positive 08/18/2015   Mouth dryness 08/18/2015   GAD (generalized anxiety disorder) 08/17/2015   Recurrent major depressive disorder ([place]) 08/17/2015   Left  foot pain 07/04/2015   Telangiectasia 07/04/2015   Elevated glucose 05/04/2015   Milia 05/04/2015   Vitamin D deficiency 05/04/2015   Anemia 04/27/2015   Fatigue 04/27/2015   Health care maintenance 04/27/2015   History of MI (myocardial infarction) 04/27/2015   Hot flashes 04/27/2015   Low back pain radiating to both legs 03/10/2014    Bilateral carotid artery disease ([place]) 02/17/2014   First degree hemorrhoids 01/15/2014   Gastroesophageal reflux disease without esophagitis 01/15/2014   Iron deficiency anemia 01/13/2014   RBC microcytosis 01/13/2014   Coronary artery disease involving native coronary artery of native heart without angina pectoris 12/17/2013   Essential hypertension 06/25/2013   High cholesterol 06/25/2013   Chest pain with moderate risk of acute coronary syndrome 06/24/2013   NSTEMI (non-ST elevated myocardial infarction) ([place]) 06/24/2013   Myocardial infarction ([place]) 06/19/2013   Tobacco abuse 06/10/2012   Allergic rhinitis 06/10/2012   ADD (attention deficit disorder) 03/06/2012   Depressive disorder 04/12/2011   PCP:  Karleen Hampshire., MD Pharmacy:   [facility] [place], [address] [place]  [address] Phone: [phone] Fax: [phone]     Social Determinants of Health (SDOH) Social History: SDOH Screenings   Food Insecurity: No Food Insecurity (03/05/2022)  Housing: Low Risk  (03/05/2022)  Transportation Needs: No Transportation Needs (03/05/2022)  Utilities: Not At Risk (03/05/2022)  Tobacco Use: High Risk (03/05/2022)   SDOH Interventions:     Readmission Risk Interventions   No data to display

## 2022-03-05 NOTE — TOC Transition Note (Signed)
Transition of Care [facility]) - CM/SW Discharge Note   Patient Details  Name: Lynn Harvey MRN: [id number] Date of Birth: 1965-06-13  Transition of Care [facility]) CM/SW Contact:  Leeroy Cha, RN Phone Number: 03/05/2022, 9:43 AM   Clinical Narrative:    Dischzrged to home with no toc needs.   Final next level of care: Home/Self Care Barriers to Discharge: Barriers Resolved   Patient Goals and CMS Choice CMS Medicare.gov Compare Post Acute Care list provided to:: Patient    Discharge Placement                         Discharge Plan and Services Additional resources added to the After Visit Summary for     Discharge Planning Services: CM Consult                                 Social Determinants of Health (SDOH) Interventions SDOH Screenings   Food Insecurity: No Food Insecurity (03/05/2022)  Housing: Low Risk  (03/05/2022)  Transportation Needs: No Transportation Needs (03/05/2022)  Utilities: Not At Risk (03/05/2022)  Tobacco Use: High Risk (03/05/2022)     Readmission Risk Interventions   No data to display

## 2022-03-05 NOTE — Discharge Summary (Addendum)
Physician Discharge Summary  Lynn Harvey [id number] DOB: 15-Aug-1965 DOA: 03/01/2022  PCP: Karleen Hampshire., MD  Admit date: 03/01/2022 Discharge date: 03/05/2022  Admitted From: Home  Discharge disposition: Home  Recommendations for Outpatient Follow-Up:   Follow up with your primary care provider in one week.  Check CBC, CMP, magnesium in the next visit Follow-up with Dr. Marin Comment [facility] in 1 to 2 weeks.  Patient will need to be adjusted in her spironolactone and Lasix doses.  Discharge Diagnosis:   Principal Problem:   Hypokalemia Active Problems:   Dehydration with hyponatremia   Alcohol abuse   Tobacco abuse   Essential hypertension   Coronary artery disease involving native coronary artery of native heart without angina pectoris   Gastroesophageal reflux disease without esophagitis   Sjogren's syndrome (HCC)   Alcoholic cirrhosis of liver with ascites ([place])  Discharge Condition: Improved.  Diet recommendation: Low sodium, heart healthy.    Wound care: None.  Code status: Full.   History of Present Illness:   Patient is a 57 year old female with past medical history of alcohol abuse, cirrhosis of liver and ascites was seen by her GI team and was noted to have potassium of 2.3. She was then sent to the hospital for further evaluation and treatment.  Lab work done in the ED showed potassium of 2.9 with albumin of 2.2 INR was 1.2.  Patient was then admitted hospital for further evaluation and treatment.  During hospitalization, patient continued to have abdominal pain and distention with poor appetite so ultrasound-guided paracentesis was done with removal of fluids.  Subsequently patient had improved appetite but peritoneal fluid was cloudy with elevated nucleated cells.  GI was consulted.  Hospital Course:   Following conditions were addressed during hospitalization as listed below,  Severe hypokalemia: -Potassium of 2.3 on presentation.  Received  potassium aggressively.  Potassium today at 3.9.  Patient was encouraged oral intake at home.   Alcoholic liver disease with cirrhosis and ascites, coagulopathy: Last alcohol use was 2 days prior to presentation.  She was put on CIWA protocol but did not require marked administration of Ativan.  She has remained stable at this time.  Emphasized quitting alcohol.  She wishes to quit and wants to go home and do it.  Underwent ultrasound-guided paracentesis with removal of 1.6 L of hazy fluid.  Gram stain was negative for organisms.  Was initially on Rocephin for possible SBP which was discontinued after GI consultation.  GI recommended 100 mg of spironolactone and 40 mg of Lasix on discharge.  At this time patient is on 40 mg of Lasix and 50 mg of spironolactone.  She was on amlodipine and metoprolol as outpatient.  Dose of metoprolol has been decreased and amlodipine has been discontinued.  Will need adjustment of medications as outpatient with GI clinic.  Advised follow-up with Dr. Truman Hayward [facility] in 1 to 2 weeks.   Tobacco abuse: Received a nicotine patch while in the hospital.  Motivated to quit.   Coronary artery disease: Stable.  No acute issues.  Denies chest pain.  Dose of metoprolol was decreased to 12.5 twice daily from 25 twice daily.  Amlodipine has been discontinued since patient has been started on 2 different diuretics at this time.   Hypoalbuminemia: Secondary diabetes and poor oral intake.  Encouraged oral intake.   Hyponatremia: Mild hyponatremia improved.  Sodium today at 136.   Moderate malnutrition: Present on admission.  Encourage nutritional intake on  discharge.  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  Patient takes gammaglobulin as outpatient.  Disposition.  At this time, patient is stable for disposition home with outpatient PCP and GI follow-up.  Medical Consultants:   Interventional radiology GI  Procedures:    Ultrasound-guided paracentesis  on 03/03/2022 Subjective:   Today, patient was seen and examined at bedside.  Feels better.  Denies any tremors sweating diaphoresis hallucinations abdominal pain.  Was able to eat better yesterday after paracentesis.  Denies abdominal pain.  No fever or chills.  Discharge Exam:   Vitals:   03/04/22 2046 03/05/22 0522  BP: 122/68 126/70  Pulse: 93 83  Resp: 20 20  Temp: 99.8 F (37.7 C) 98.8 F (37.1 C)  SpO2: 97% 95%   Vitals:   03/04/22 0451 03/04/22 1233 03/04/22 2046 03/05/22 0522  BP: 111/72 109/67 122/68 126/70  Pulse: 85 89 93 83  Resp: 18 18 20 20   Temp: 98.1 F (36.7 C) 98.5 F (36.9 C) 99.8 F (37.7 C) 98.8 F (37.1 C)  TempSrc: Oral Oral Oral Oral  SpO2: 95% 95% 97% 95%   General: Alert awake, not in obvious distress, appears to be more calm and composed, HENT: pupils equally reacting to light,  No scleral pallor or icterus noted. Oral mucosa is moist.  Chest:  Clear breath sounds.  Diminished breath sounds bilaterally. No crackles or wheezes.  CVS: S1 &S2 heard. No murmur.  Regular rate and rhythm. Abdomen: Soft,  Bowel sounds are heard.  Mild fullness of the flanks.  Nontender. Extremities: No cyanosis, clubbing with trace lower extremity edema.  Peripheral pulses are palpable. Psych: Alert, awake and oriented, normal mood CNS:  No cranial nerve deficits.  Power equal in all extremities.   Skin: Warm and dry.  No rashes noted.  The results of significant diagnostics from this hospitalization (including imaging, microbiology, ancillary and laboratory) are listed below for reference.     Diagnostic Studies:   DG Chest 2 View  Result Date: 03/01/2022 CLINICAL DATA:  Chest pain EXAM: CHEST - 2 VIEW COMPARISON:  12/17/2013 FINDINGS: The heart size and mediastinal contours are within normal limits. Aortic atherosclerosis. Both lungs are clear. The visualized skeletal structures are unremarkable. IMPRESSION: No active cardiopulmonary disease. Electronically Signed    By: Donavan Foil M.D.   On: 03/01/2022 17:13    Labs:   Basic Metabolic Panel: Recent Labs  Lab 03/01/22 1640 03/02/22 0646 03/02/22 1613 03/03/22 0648 03/04/22 0550 03/05/22 0700  NA 134* 136  --  136 132* 136  K 2.3* 2.9*  --  4.1 3.7 3.9  CL 87* 99  --  101 102 103  CO2 34* 29  --  26 25 27   GLUCOSE 91 98  --  85 95 102*  BUN <5* <5*  --  5* 6 5*  CREATININE 0.61 0.56  --  0.54 0.57 0.62  CALCIUM 8.3* 7.6*  --  8.0* 7.6* 8.3*  MG 1.8  --  1.7  --  2.1 1.9   GFR CrCl cannot be calculated (Unknown ideal weight.). Liver Function Tests: Recent Labs  Lab 03/01/22 1640 03/02/22 0646 03/03/22 0648 03/04/22 0550 03/05/22 0700  AST 52* 36 39 39 50*  ALT 20 16 15 14 16   ALKPHOS 121 91 102 88 82  BILITOT 1.8* 1.8* 1.8* 1.1 1.3*  PROT 7.3 5.7* 6.2* 5.8* 5.8*  ALBUMIN 2.9* 2.2* 2.5* 2.3* 2.5*   No results for input(s): "LIPASE", "AMYLASE" in the last 168 hours. No results  for input(s): "AMMONIA" in the last 168 hours. Coagulation profile Recent Labs  Lab 03/01/22 1640  INR 1.2    CBC: Recent Labs  Lab 03/01/22 1640 03/02/22 0646 03/04/22 0550 03/05/22 0700  WBC 9.6 6.5 6.7 6.9  HGB 14.7 12.7 13.3 13.4  HCT 42.7 37.6 40.2 40.4  MCV 100.2* 102.7* 105.8* 104.9*  PLT 211 163 166 176   Cardiac Enzymes: No results for input(s): "CKTOTAL", "CKMB", "CKMBINDEX", "TROPONINI" in the last 168 hours. BNP: Invalid input(s): "POCBNP" CBG: No results for input(s): "GLUCAP" in the last 168 hours. D-Dimer No results for input(s): "DDIMER" in the last 72 hours. Hgb A1c No results for input(s): "HGBA1C" in the last 72 hours. Lipid Profile No results for input(s): "CHOL", "HDL", "LDLCALC", "TRIG", "CHOLHDL", "LDLDIRECT" in the last 72 hours. Thyroid function studies No results for input(s): "TSH", "T4TOTAL", "T3FREE", "THYROIDAB" in the last 72 hours.  Invalid input(s): "FREET3" Anemia work up No results for input(s): "VITAMINB12", "FOLATE", "FERRITIN", "TIBC",  "IRON", "RETICCTPCT" in the last 72 hours. Microbiology Recent Results (from the past 240 hour(s))  Aerobic/Anaerobic Culture w Gram Stain (surgical/deep wound)     Status: None (Preliminary result)   Collection Time: 03/03/22 11:01 AM   Specimen: PATH Cytology Peritoneal fluid  Result Value Ref Range Status   Specimen Description   Final    PERITONEAL Performed at [facility], [address], [place], [place] [address]    Special Requests   Final    NONE Performed at [facility], [address], [place], [place] [address]    Gram Stain NO ORGANISMS SEEN NO WBC SEEN   Final   Culture   Final    NO GROWTH 2 DAYS NO ANAEROBES ISOLATED; CULTURE IN PROGRESS FOR 5 DAYS Performed at [facility], [address], [place], [place] [address]    Report Status PENDING  Incomplete  Aerobic Culture w Gram Stain (superficial specimen)     Status: None (Preliminary result)   Collection Time: 03/03/22 11:01 AM   Specimen: PATH Cytology Peritoneal fluid  Result Value Ref Range Status   Specimen Description   Final    PERITONEAL Performed at [facility], [address] [place], [place] [address]    Special Requests   Final    NONE Performed at [facility], [address], [place], [place] [address]    Gram Stain NO ORGANISMS SEEN NO WBC SEEN   Final   Culture   Final    NO GROWTH 2 DAYS Performed at [facility], [address], [place], [place] [address]    Report Status PENDING  Incomplete  Gram stain     Status: None   Collection Time: 03/03/22 11:01 AM   Specimen: Fluid  Result Value Ref Range Status   Specimen Description FLUID  Final   Special Requests NONE  Final   Gram Stain   Final    RARE WBC SEEN NO ORGANISMS SEEN Performed at [facility], [address] [place], [place] [address]    Report Status 03/03/2022 FINAL  Final  Culture, body fluid w Gram Stain-bottle      Status: None (Preliminary result)   Collection Time: 03/03/22 11:01 AM   Specimen: Fluid  Result Value Ref Range Status   Specimen Description FLUID  Final   Special Requests NONE  Final   Culture   Final    NO GROWTH 2 DAYS Performed at [facility], [place] [address] [place], [place] [address]    Report Status PENDING  Incomplete     Discharge Instructions:   Discharge Instructions     Diet - low sodium heart healthy   Complete by: As directed    Discharge instructions   Complete by: As directed    Follow-up with your primary care provider in 1 week.  Check blood work at that time.  Follow-up with [facility]  Dr Marin Comment in 1 to 2 weeks.  You have been started on diuretic medication and take as prescribed and might need adjustment on discharge.  Low-salt diet.  Seek medical attention for worsening symptoms. No alcohol ingestion.   Increase activity slowly   Complete by: As directed       Allergies as of 03/05/2022       Reactions   Buprenorphine Hcl Nausea And Vomiting   Doxycycline Nausea Only   Morphine And Related Nausea And Vomiting   Ace Inhibitors Cough        Medication List     STOP taking these medications    acetaminophen-codeine 300-30 MG tablet Commonly known as: TYLENOL #3   amLODipine 5 MG tablet Commonly known as: NORVASC       TAKE these medications    amphetamine-dextroamphetamine 15 MG tablet Commonly known as: ADDERALL Take 0.5 tablets by mouth 2 (two) times daily.   aspirin EC 81 MG tablet Take 81 mg by mouth daily.   ezetimibe 10 MG tablet Commonly known as: ZETIA TAKE 1 TABLET(10 MG) BY MOUTH DAILY What changed: See the new instructions.   folic acid 1 MG tablet Commonly known as: FOLVITE Take 1 tablet (1 mg total) by mouth daily.   furosemide 40 MG tablet Commonly known as: LASIX Take 1 tablet (40 mg total) by mouth in the morning.   gabapentin 400 MG capsule Commonly known as: NEURONTIN TAKE 1 CAPSULE(400  MG) BY MOUTH THREE TIMES DAILY What changed: See the new instructions.   Gammagard 20 GM/200ML Soln Generic drug: Immune Globulin (Human) Infused thru [facility] What changed:  how to take this when to take this additional instructions   metoprolol tartrate 25 MG tablet Commonly known as: LOPRESSOR Take 0.5 tablets (12.5 mg total) by mouth 2 (two) times daily. What changed: how much to take   multivitamin with minerals Tabs tablet Take 1 tablet by mouth daily.   nitroGLYCERIN 0.4 MG SL tablet Commonly known as: NITROSTAT DISSOLVE 1 TABLET UNDER THE TONGUE EVERY 5 MINUTES AS NEEDED FOR CHEST PAIN What changed: See the new instructions.   omeprazole 20 MG capsule Commonly known as: PRILOSEC Take 20 mg by mouth 2 (two) times daily before a meal.   ondansetron 4 MG tablet Commonly known as: ZOFRAN Take 1 tablet (4 mg total) by mouth every 8 (eight) hours as needed for nausea or vomiting.   pilocarpine 5 MG tablet Commonly known as: SALAGEN Take 5 mg by mouth 3 (three) times daily.   pyridoxine 100 MG tablet Commonly known as: B-6 Take 1 tablet (100 mg total) by mouth daily.   spironolactone 50 MG tablet Commonly known as: ALDACTONE Take 1 tablet (50 mg total) by mouth every evening.   thiamine 100 MG tablet Commonly known as: VITAMIN B1 Take 1 tablet (100 mg total) by mouth daily.   thiamine 100 MG tablet Commonly known as: Vitamin B-1 Take 1 tablet (100 mg total) by mouth daily.   valACYclovir 500 MG tablet Commonly  known as: VALTREX Take 500 mg by mouth 3 (three) times daily as needed (fever blister).   zolpidem 5 MG tablet Commonly known as: AMBIEN Take 5 mg by mouth at bedtime as needed for sleep.        Follow-up Information     Karleen Hampshire., MD Follow up in 1 week(s).   Specialty: Internal Medicine Contact information: [address] [place] [address] [phone]         Lake Bells., MD Follow up in 1 week(s).   Specialty:  Gastroenterology Contact information: [address] [place] [address] [phone]                  Time coordinating discharge: 39 minutes  Signed:  Tatsuya Okray  [facility] 03/05/2022, 1:07 PM

## 2022-03-06 LAB — AEROBIC CULTURE W GRAM STAIN (SUPERFICIAL SPECIMEN)
Culture: NO GROWTH
Gram Stain: NONE SEEN

## 2022-03-06 LAB — CYTOLOGY - NON PAP

## 2022-03-08 LAB — AEROBIC/ANAEROBIC CULTURE W GRAM STAIN (SURGICAL/DEEP WOUND)
Culture: NO GROWTH
Gram Stain: NONE SEEN

## 2022-03-08 LAB — CULTURE, BODY FLUID W GRAM STAIN -BOTTLE: Culture: NO GROWTH

## 2022-03-09 ENCOUNTER — Other Ambulatory Visit: Payer: Self-pay | Admitting: Cardiovascular Disease

## 2022-03-28 ENCOUNTER — Telehealth: Payer: Self-pay

## 2022-03-28 NOTE — Telephone Encounter (Signed)
Pt has appt [facility] 04-20-2022 with Neurology (confirmed with [facility]).  I called [facility] and spoke to [place],  pt due next 04-05-2022 for her IVIG, this information packet is needed to address this PA.  Calling [phone], Lars Mage, Case [id number].

## 2022-03-28 NOTE — Telephone Encounter (Addendum)
I called pt and relayed that per Dr. Jaynee Eagles wants her to continue with [facility] eval , I see no referral to [facility] but have this prior to submitting anymore information to [facility] for her IVIG. Pt was not happy, she knows she needs her IVIG, I heard her say she went to [facility] to r/o MS and that was ruled out by pt.  I see appt 04-20-2022 confirmed by neurology scheduing at [facility]). I will get back to her after speaking to Dr. Jaynee Eagles.

## 2022-03-28 NOTE — Telephone Encounter (Signed)
I am not sure she needs the IVIG anymore so I would holdoff on any paperwork. Patient has a referral with [facility] and she needs to see them first before we continue. She was referred by [facility] to neurology, please reach out to patient and make sure she know she has to see [facility](make sure she has an appointment) because insurance is questioning if she needs the IVIG anymore and I would like [facility]'s opinion thanks

## 2022-03-28 NOTE — Telephone Encounter (Signed)
Spoke with [place] [phone].  Insurance is looking for additional information on the PA submission for IVIG. They stated they are faxing over a request. If we have not received the fax, recommend reaching out to insurance regarding this  Just FYI

## 2022-03-28 NOTE — Telephone Encounter (Signed)
I spoke to Dr. Jaynee Eagles,  she said that pt has appt 04-20-2022 with [facility] neuromuscular team,  she would like pt to go and be examined without the IVIG on board to get a more specific /thorough examination.  At this time will hold on the IVIG p/w at this time.  Pt was not happy and did not agree with this plan.  She said would be looking for another neurologist.  She wanted Dr. Jaynee Eagles notified.  Pt is due her next infusion per [facility] 04/06/2022.  (PA p/w held).

## 2022-03-29 ENCOUNTER — Encounter: Payer: Self-pay | Admitting: Neurology

## 2022-03-29 NOTE — Telephone Encounter (Signed)
Received today by fax (dated 03-29-2022) that [place] approved Gammagard 20/GM/211ml IJ Soln.  Quantity approved 9999.0 units.  REF # [id number] place of service 12-Home-12.Servicing provider [employer].  Effective dates of auth:  04-06-2022 thru 04-05-2023. [phone] or fax updates [phone] (corporate pharmacy).

## 2022-03-30 ENCOUNTER — Other Ambulatory Visit: Payer: Self-pay | Admitting: Adult Health

## 2022-04-03 ENCOUNTER — Telehealth: Payer: Self-pay | Admitting: Adult Health

## 2022-04-03 ENCOUNTER — Other Ambulatory Visit: Payer: Self-pay | Admitting: Adult Health

## 2022-04-03 ENCOUNTER — Other Ambulatory Visit: Payer: Self-pay | Admitting: *Deleted

## 2022-04-03 ENCOUNTER — Ambulatory Visit: Payer: BC Managed Care – PPO | Admitting: Neurology

## 2022-04-03 MED ORDER — GABAPENTIN 400 MG PO CAPS: 400.0000 mg | ORAL_CAPSULE | Freq: Three times a day (TID) | ORAL | 0 refills | Status: AC

## 2022-04-03 MED ORDER — GABAPENTIN 400 MG PO CAPS: 400.0000 mg | ORAL_CAPSULE | Freq: Three times a day (TID) | ORAL | 0 refills | Status: DC

## 2022-04-03 NOTE — Telephone Encounter (Signed)
Refilled for 30 days 

## 2022-04-03 NOTE — Telephone Encounter (Signed)
Pt is calling. Requesting a refill on gabapentin (NEURONTIN) 400 MG capsule. Refill should be sent to [place] [phone]

## 2022-04-11 NOTE — Telephone Encounter (Signed)
[facility] Lanelle Bal ) Checking on [place] forms, emailed to [place] ([place]). Can sign and fax to [phone].

## 2022-04-12 NOTE — Telephone Encounter (Signed)
Pt has appt with [facility] for eval.  Dr. Jaynee Eagles is not completing, wanted pt to be off ivig for her upcoming appt for eval and assessment.

## 2022-04-12 NOTE — Telephone Encounter (Signed)
Patient called office today. She understands Dr Jaynee Eagles did not sign the form to allow IVIG to continue but she is confused as to why insurance approved the IVIG if Dr Jaynee Eagles did not think she needed to be on it.  I did advise patient that [facility] works with insurance on our behalf for approval and its possible they submitted a request but I am not able to see where our office had directly submitted a recent prior authorization. The patient said I did not need to do anything. She was just wondering. She will see [facility] on 3/1. She feels the IVIG is helpful and she doesn't think a month off the IVIG will be good enough for exam by [place] updated Asencion Partridge w/ [facility] that Dr Jaynee Eagles is not signing the renewal form for IVIG visits.

## 2022-04-28 ENCOUNTER — Other Ambulatory Visit: Payer: Self-pay | Admitting: Adult Health

## 2022-05-03 NOTE — Telephone Encounter (Signed)
IVIG discharge order sheet received from [facility]. Form signed by Dr Jaynee Eagles then faxed back to [facility]. Received a receipt of confirmation.

## 2022-08-28 ENCOUNTER — Other Ambulatory Visit: Payer: Self-pay | Admitting: Cardiovascular Disease

## 2022-11-24 ENCOUNTER — Other Ambulatory Visit: Payer: Self-pay | Admitting: Cardiovascular Disease

## 2022-12-24 ENCOUNTER — Other Ambulatory Visit: Payer: Self-pay | Admitting: Cardiovascular Disease

## 2023-02-22 ENCOUNTER — Other Ambulatory Visit: Payer: Self-pay | Admitting: Cardiovascular Disease

## 2023-03-14 ENCOUNTER — Other Ambulatory Visit (HOSPITAL_COMMUNITY): Payer: Self-pay | Admitting: Neurosurgery

## 2023-03-14 DIAGNOSIS — M48062 Spinal stenosis, lumbar region with neurogenic claudication: Secondary | ICD-10-CM

## 2023-03-20 ENCOUNTER — Ambulatory Visit (HOSPITAL_COMMUNITY)
Admission: RE | Admit: 2023-03-20 | Discharge: 2023-03-20 | Disposition: A | Discharge status: Home / Self Care | Payer: BC Managed Care – PPO | Source: Ambulatory Visit | Attending: Neurosurgery | Admitting: Neurosurgery

## 2023-03-20 DIAGNOSIS — M48062 Spinal stenosis, lumbar region with neurogenic claudication: Secondary | ICD-10-CM | POA: Insufficient documentation

## 2023-03-20 LAB — VAS US ABI WITH/WO TBI
Left ABI: 0.8
Right ABI: 0.65

## 2023-03-20 NOTE — Progress Notes (Signed)
ABI's have been completed. Preliminary results can be found in CV Proc through chart review.   03/20/23 11:01 AM Olen Cordial RVT

## 2023-04-08 NOTE — Progress Notes (Unsigned)
 Office Note     CC:  Claudication with abnormal ABI Requesting Provider:  Lisbeth Renshaw, MD  HPI: AEON KOORS is a 58 y.o. (June 12, 1965) female presenting at the request of .[name], MD bilateral lower extremity claudication, right greater than left.  On exam, Lowella Bandy was doing well.  Originally from [facility], she moved to [place] years ago to be closer to her mother.  Her mother has since passed, and she continues to live at the home.  [name] works full-time in [employer].  She is a lifetime smoker, currently 1 pack/day.  A mother of 3, 2 of her children are still living.  She has 4 grandchildren whom she is very close with.  Over the last 2 years, Higinio Plan has appreciated bilateral lower extremity cramping with ambulation.  This is more pronounced in the right leg as compared to the left.  She has a history of neuropathy, and originally contributed this pain to her neuropathy, however was referred to neurosurgery for a second opinion.  Dr. Conchita Paris thought that her pain was likely claudication from PAD.  [name] can ambulate roughly 50 yards before appreciating symptoms in her calves and thighs.  She is able to walk through the pain on most occasions, and the pain does not stop her from doing everyday activities like grocery shopping.  It is nearly-omnipresent with ambulation however.  She notes that even when getting up from work, and walking to the bathroom.  She notes pain at night, but states this is from her neuropathy.  Denies wounds on her feet. 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  The pt is not on a statin for cholesterol management.  The pt is not on a daily aspirin.   Other AC:  - The pt is not on medication for hypertension.   The pt is not diabetic.  Tobacco hx: 1 pack/day current  Past Medical History:  Diagnosis Date   Acute sensory neuropathy 09/18/2019   ADD (attention  deficit disorder)    Allergy    Anemia    Anxiety    Coronary artery disease    a. s/p NSTEMI in 2015 with angioplasty alone to mid-LAD, repeat cath within the same month showing a patent site   Depression    Depression    Fatty liver    GAD (generalized anxiety disorder)    GERD (gastroesophageal reflux disease)    Gestational diabetes    History of heart attack    Hyperlipidemia    Hypertension    Insomnia    Lupus (HCC)    [place]    Myocardial infarction (HCC) 06/2013   Rectal abnormality    Sicca syndrome (HCC)    Sjogren's syndrome (HCC)    Telangiectasia    Vitamin D deficiency     Past Surgical History:  Procedure Laterality Date   ABDOMINAL HYSTERECTOMY     fibroids, endometrosis.    CHOLECYSTECTOMY  08/2018   COLON SURGERY     Flexible Sigmoidoscopy   COLONOSCOPY N/A 01/08/2014   Procedure: COLONOSCOPY;  Surgeon: Theda Belfast, MD;  Location: [facility] ENDOSCOPY;  Service: Endoscopy;  Laterality: N/A;   ESOPHAGOGASTRODUODENOSCOPY N/A 01/08/2014   Procedure: ESOPHAGOGASTRODUODENOSCOPY (EGD);  Surgeon: Theda Belfast, MD;  Location: [facility];  Service: Endoscopy;  Laterality: N/A;   LEFT HEART CATHETERIZATION WITH CORONARY ANGIOGRAM N/A 06/24/2013   Procedure: LEFT HEART CATHETERIZATION WITH CORONARY ANGIOGRAM;  Surgeon: Maisie Fus  Alphonsus Sias, MD;  Location: [facility] CATH LAB;  Service: Cardiovascular;  Laterality: N/A;   LEFT HEART CATHETERIZATION WITH CORONARY ANGIOGRAM N/A 07/17/2013   Procedure: LEFT HEART CATHETERIZATION WITH CORONARY ANGIOGRAM;  Surgeon: Kathleene Hazel, MD;  Location: [facility] CATH LAB;  Service: Cardiovascular;  Laterality: N/A;   TONSILECTOMY, ADENOIDECTOMY, BILATERAL MYRINGOTOMY AND TUBES     WRIST SURGERY  1987   RIGHT; bone from forearm grafted to wrist    Social History   Socioeconomic History   Marital status: Married    Spouse name: [name]   Number of children: 3   Years of education: 12   Highest education level: Not on file  Occupational  History   Occupation: CUSTOMER SERVICE    Employer: [employer]  Tobacco Use   Smoking status: Every Day    Current packs/day: 1.00    Average packs/day: 1 pack/day for 40.3 years (40.3 ttl pk-yrs)    Types: Cigarettes    Start date: 12/25/1982   Smokeless tobacco: Never   Tobacco comments:    09/03/19 < 1.5 ppd  Vaping Use   Vaping status: Never Used  Substance and Sexual Activity   Alcohol use: Not Currently    Comment: Drank 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   Social Drivers of Corporate investment banker Strain: Not on file  Food Insecurity: Low Risk  (05/17/2022)   [facility]   Hunger Vital Sign    Worried About Running Out of Food in the Last Year: Never true    Ran Out of Food in the Last Year: Never true  Transportation Needs: Not on file (05/17/2022)  Physical Activity: Not on file  Stress: Not on file  Social Connections: Not on file  Intimate Partner Violence: Not At Risk (03/05/2022)   Humiliation, Afraid, Rape, and Kick questionnaire    Fear of Current or Ex-Partner: No    Emotionally Abused: No    Physically Abused: No    Sexually Abused: No   Family History  Problem Relation Age of Onset   COPD Mother        emphysema   Lung cancer Mother    Heart disease Father 65       s/p 3V CABG   Depression Father    Barrett's esophagus Father    Mental illness Sister        depression   Depression Sister    Multiple sclerosis Sister    Lung cancer Maternal Aunt    Diabetes Maternal Grandmother    Heart disease Maternal Grandmother    Diabetes Maternal Grandfather    Cancer Maternal Grandfather        lung cancer   Diabetes Paternal Grandmother    Kidney disease Paternal  Grandmother    Diabetes Paternal Grandfather    Heart disease Paternal Grandfather    Mental illness Son        ADHD, bipolar disorder   Neuropathy Neg Hx     Current Outpatient Medications  Medication Sig Dispense Refill   amphetamine-dextroamphetamine (ADDERALL) 15 MG tablet Take 0.5 tablets by mouth 2 (two) times daily. 60 tablet 0   aspirin EC 81 MG tablet Take  81 mg by mouth daily.     ezetimibe (ZETIA) 10 MG tablet TAKE 1 TABLET(10 MG) BY MOUTH DAILY 15 tablet 0   folic acid (FOLVITE) 1 MG tablet Take 1 tablet (1 mg total) by mouth daily.     furosemide (LASIX) 40 MG tablet Take 1 tablet (40 mg total) by mouth in the morning. 30 tablet 0   gabapentin (NEURONTIN) 400 MG capsule Take 1 capsule (400 mg total) by mouth 3 (three) times daily. 90 capsule 0   Immune Globulin, Human, (GAMMAGARD) 20 GM/200ML SOLN Infused thru [facility] (Patient taking differently: Inject into the vein See admin instructions. Every 4 weeks)     metoprolol tartrate (LOPRESSOR) 25 MG tablet Take 0.5 tablets (12.5 mg total) by mouth 2 (two) times daily. 60 tablet 2   Multiple Vitamin (MULTIVITAMIN WITH MINERALS) TABS tablet Take 1 tablet by mouth daily. 30 tablet 0   nitroGLYCERIN (NITROSTAT) 0.4 MG SL tablet DISSOLVE 1 TABLET UNDER THE TONGUE EVERY 5 MINUTES AS NEEDED FOR CHEST PAIN (Patient taking differently: Place 0.4 mg under the tongue every 5 (five) minutes as needed for chest pain.) 45 tablet 1   omeprazole (PRILOSEC) 20 MG capsule Take 20 mg by mouth 2 (two) times daily before a meal.     ondansetron (ZOFRAN) 4 MG tablet Take 1 tablet (4 mg total) by mouth every 8 (eight) hours as needed for nausea or vomiting. 12 tablet 0   pilocarpine (SALAGEN) 5 MG tablet Take 5 mg by mouth 3 (three) times daily.      pyridOXINE (B-6) 100 MG tablet Take 1 tablet (100 mg total) by mouth daily.     spironolactone (ALDACTONE) 50 MG tablet Take 1 tablet (50 mg total) by mouth every evening. 30 tablet 0   thiamine (VITAMIN  B-1) 100 MG tablet Take 1 tablet (100 mg total) by mouth daily. 100 tablet 0   thiamine 100 MG tablet Take 1 tablet (100 mg total) by mouth daily.     valACYclovir (VALTREX) 500 MG tablet Take 500 mg by mouth 3 (three) times daily as needed (fever blister).     zolpidem (AMBIEN) 5 MG tablet Take 5 mg by mouth at bedtime as needed for sleep.     No current facility-administered medications for this visit.    Allergies  Allergen Reactions   Buprenorphine Hcl Nausea And Vomiting   Doxycycline Nausea Only   Morphine And Codeine Nausea And Vomiting   Ace Inhibitors Cough     REVIEW OF SYSTEMS:  [X]  denotes positive finding, [ ]  denotes negative finding Cardiac  Comments:  Chest pain or chest pressure:    Shortness of breath upon exertion:    Short of breath when lying flat:    Irregular heart rhythm:        Vascular    Pain in calf, thigh, or hip brought on by ambulation: X   Pain in feet at night that wakes you up from your sleep:     Blood clot in your veins:    Leg swelling:         Pulmonary    Oxygen at home:    Productive cough:     Wheezing:         Neurologic    Sudden weakness in arms or legs:     Sudden numbness in arms or legs:     Sudden onset of difficulty speaking or slurred speech:    Temporary loss of vision in one eye:  Problems with dizziness:         Gastrointestinal    Blood in stool:     Vomited blood:         Genitourinary    Burning when urinating:     Blood in urine:        Psychiatric    Major depression:         Hematologic    Bleeding problems:    Problems with blood clotting too easily:        Skin    Rashes or ulcers:        Constitutional    Fever or chills:      PHYSICAL EXAMINATION:  There were no vitals filed for this visit.  General:  WDWN in NAD; vital signs documented above Gait: Not observed HENT: WNL, normocephalic Pulmonary: normal non-labored breathing , without wheezing Cardiac: regular HR Abdomen: soft,  NT, no masses Skin: without rashes Vascular Exam/Pulses:  Right Left  Radial 2+ (normal) 2+ (normal)  Ulnar    Femoral    Popliteal    DP 2+ (normal) 2+ (normal)  PT     Extremities: without ischemic changes, without Gangrene , without cellulitis; without open wounds;  Musculoskeletal: no muscle wasting or atrophy  Neurologic: A&O X 3;  No focal weakness or paresthesias are detected Psychiatric:  The pt has Normal affect.   Non-Invasive Vascular Imaging:   ABI Findings:  +---------+------------------+-----+----------+--------+  Right   Rt Pressure (mmHg)IndexWaveform  Comment   +---------+------------------+-----+----------+--------+  Brachial 116                    triphasic           +---------+------------------+-----+----------+--------+  PTA     83                0.65 monophasic          +---------+------------------+-----+----------+--------+  DP      68                0.54 monophasic          +---------+------------------+-----+----------+--------+  Great Toe74                0.58                     +---------+------------------+-----+----------+--------+   +---------+------------------+-----+---------+-------+  Left    Lt Pressure (mmHg)IndexWaveform Comment  +---------+------------------+-----+---------+-------+  Brachial 127                    triphasic         +---------+------------------+-----+---------+-------+  PTA     102               0.80 biphasic          +---------+------------------+-----+---------+-------+  DP      78                0.61 biphasic          +---------+------------------+-----+---------+-------+  Great Toe76                0.60                   +---------+------------------+-----+---------+-------+   +-------+-----------+-----------+------------+------------+  ABI/TBIToday's ABIToday's TBIPrevious ABIPrevious TBI   +-------+-----------+-----------+------------+------------+  Right 0.65       0.58                                 +-------+-----------+-----------+------------+------------+  Left  0.8        0.6                                  +-------+-----------+-----------+------------+------------+     ASSESSMENT/PLAN: BRENDA SAMANO is a 58 y.o. female presenting with claudication symptoms, right greater than left.  On physical exam, she had a palpable pulse in the foot bilaterally, but depressed ABIs were appreciated.  I evaluated prior CT scans, most notably the 11/17/2019 CT abdomen pelvis without contrast.  This demonstrates a small terminal aorta, with circumferential calcific disease and atherosclerotic disease extending into small iliac arteries bilaterally.  I think her claudication symptoms are from peripheral arterial disease manifesting as inflow stenosis.  She had a 2+ palpable left femoral pulse with barely-palpable right femoral pulse.  I had a long conversation with [place] regarding the above.  We discussed that the best treatment of claudication symptoms are aspirin, high intensity statin, as well as walking therapy.  She is aware that if she continues to smoke, her claudication symptoms will progress regardless, and she will require intervention.  With her small size, I was very clear that stenting would likely have limited durability as her arteries are also small.  She is aware that to fix inflow, some patients require aortobifemoral bypass, which is a very large surgery.  At this time, we discussed continued medical management with medication optimization and exercise program.  My plan is to see her back in 6 months time with exercise ABI to ensure that inflow disease is the causative factor for her claudication.  I gave her my card and asked her to call should her symptoms worsen.  Recommend the following which can slow the progression of atherosclerosis and reduce the risk  of major adverse cardiac / limb events:  Aspirin 81mg  PO QD.  Atorvastatin 40-80mg  PO QD (or other "high intensity" statin therapy). Complete cessation from all tobacco products. Blood glucose control with goal A1c < 7%. Blood pressure control with goal blood pressure < 140/90 mmHg. Lipid reduction therapy with goal LDL-C <100 mg/dL (<40 if symptomatic from PAD).    Victorino Sparrow, MD [facility] [phone] Total time of patient care including pre-visit research, consultation, and documentation greater than 45 minutes

## 2023-04-11 ENCOUNTER — Ambulatory Visit (INDEPENDENT_AMBULATORY_CARE_PROVIDER_SITE_OTHER): Payer: BC Managed Care – PPO | Admitting: Vascular Surgery

## 2023-04-11 ENCOUNTER — Other Ambulatory Visit: Payer: Self-pay | Admitting: *Deleted

## 2023-04-11 ENCOUNTER — Encounter: Payer: Self-pay | Admitting: Vascular Surgery

## 2023-04-11 VITALS — BP 128/73 | HR 67 | Temp 98.3°F | Resp 20 | Ht 63.0 in | Wt 125.0 lb

## 2023-04-11 DIAGNOSIS — I70213 Atherosclerosis of native arteries of extremities with intermittent claudication, bilateral legs: Secondary | ICD-10-CM | POA: Diagnosis not present

## 2023-05-24 ENCOUNTER — Telehealth: Payer: Self-pay | Admitting: Cardiovascular Disease

## 2023-05-24 NOTE — Telephone Encounter (Signed)
 Received call from [place] with [facility]  Lynn Harvey states:   -Patient is being followed by Hepatologist Dr. Laurette Schimke   -Dr. Laurette Schimke would like to have Metoprolol changed to Carvedilol   -would be best for patient to change to Carvedilol 3.125mg  BID   -wants Dr. Tresa Endo to adjust medication  Informed Kendle:   -Patient has not been seen in 02/04/2020  -Metoprolol Rx filled by Dr. Tyson Babinski 03/05/22 Kendle states she will outreach patient and confirm who cardiologist is

## 2023-05-24 NOTE — Telephone Encounter (Signed)
 Patient is calling to talk with a triage nurse in regards to medication change for patient

## 2023-05-29 ENCOUNTER — Other Ambulatory Visit: Payer: Self-pay | Admitting: Medical Genetics

## 2023-05-29 ENCOUNTER — Encounter: Payer: Self-pay | Admitting: Cardiovascular Disease

## 2023-06-03 ENCOUNTER — Other Ambulatory Visit (HOSPITAL_COMMUNITY)
Admission: RE | Admit: 2023-06-03 | Discharge: 2023-06-03 | Disposition: A | Discharge status: Home / Self Care | Payer: Self-pay | Source: Ambulatory Visit | Attending: Medical Genetics | Admitting: Medical Genetics

## 2023-06-06 NOTE — Telephone Encounter (Signed)
 Ok to switch to coreg from my perspective.

## 2023-06-09 NOTE — Telephone Encounter (Signed)
 Okay to switch to carvedilol.

## 2023-06-12 NOTE — Telephone Encounter (Signed)
 Mailbox is full.

## 2023-06-14 LAB — GENECONNECT MOLECULAR SCREEN: Genetic Analysis Overall Interpretation: NEGATIVE

## 2023-06-21 NOTE — Telephone Encounter (Signed)
 Mailbox is full.Will send letter.

## 2023-07-27 ENCOUNTER — Emergency Department (HOSPITAL_COMMUNITY)
Admission: EM | Admit: 2023-07-27 | Discharge: 2023-07-27 | Disposition: A | Discharge status: Home / Self Care | Attending: Emergency Medicine | Admitting: Emergency Medicine

## 2023-07-27 ENCOUNTER — Encounter (HOSPITAL_COMMUNITY): Payer: Self-pay

## 2023-07-27 ENCOUNTER — Other Ambulatory Visit: Payer: Self-pay

## 2023-07-27 DIAGNOSIS — R350 Frequency of micturition: Secondary | ICD-10-CM | POA: Diagnosis not present

## 2023-07-27 DIAGNOSIS — I2510 Atherosclerotic heart disease of native coronary artery without angina pectoris: Secondary | ICD-10-CM | POA: Diagnosis not present

## 2023-07-27 DIAGNOSIS — R739 Hyperglycemia, unspecified: Secondary | ICD-10-CM | POA: Insufficient documentation

## 2023-07-27 LAB — URINALYSIS, ROUTINE W REFLEX MICROSCOPIC
Bilirubin Urine: NEGATIVE
Glucose, UA: >=500 mg/dL — AB
Hgb urine dipstick: NEGATIVE
Ketones, ur: NEGATIVE mg/dL
Leukocytes,Ua: NEGATIVE
Nitrite: NEGATIVE
Protein, ur: 100 mg/dL — AB
Specific Gravity, Urine: 1.032 — ABNORMAL HIGH (ref 1.005–1.030)
pH: 5 (ref 5.0–8.0)

## 2023-07-27 LAB — COMPREHENSIVE METABOLIC PANEL WITH GFR
ALT: 29 U/L (ref 0–44)
AST: 32 U/L (ref 15–41)
Albumin: 4.3 g/dL (ref 3.5–5.0)
Alkaline Phosphatase: 113 U/L (ref 38–126)
Anion gap: 12 (ref 5–15)
BUN: 19 mg/dL (ref 6–20)
CO2: 24 mmol/L (ref 22–32)
Calcium: 9.6 mg/dL (ref 8.9–10.3)
Chloride: 92 mmol/L — ABNORMAL LOW (ref 98–111)
Creatinine, Ser: 1.27 mg/dL — ABNORMAL HIGH (ref 0.44–1.00)
GFR, Estimated: 50 mL/min — ABNORMAL LOW (ref >60)
Glucose, Bld: 469 mg/dL — ABNORMAL HIGH (ref 70–99)
Potassium: 4.6 mmol/L (ref 3.5–5.1)
Sodium: 128 mmol/L — ABNORMAL LOW (ref 135–145)
Total Bilirubin: 1 mg/dL (ref 0.0–1.2)
Total Protein: 7.9 g/dL (ref 6.5–8.1)

## 2023-07-27 LAB — I-STAT CHEM 8, ED
BUN: 20 mg/dL (ref 6–20)
Calcium, Ion: 1.21 mmol/L (ref 1.15–1.40)
Chloride: 94 mmol/L — ABNORMAL LOW (ref 98–111)
Creatinine, Ser: 1.1 mg/dL — ABNORMAL HIGH (ref 0.44–1.00)
Glucose, Bld: 473 mg/dL — ABNORMAL HIGH (ref 70–99)
HCT: 49 % — ABNORMAL HIGH (ref 36.0–46.0)
Hemoglobin: 16.7 g/dL — ABNORMAL HIGH (ref 12.0–15.0)
Potassium: 4.3 mmol/L (ref 3.5–5.1)
Sodium: 132 mmol/L — ABNORMAL LOW (ref 135–145)
TCO2: 28 mmol/L (ref 22–32)

## 2023-07-27 LAB — CBG MONITORING, ED
Glucose-Capillary: 487 mg/dL — ABNORMAL HIGH (ref 70–99)
Glucose-Capillary: 300 mg/dL — ABNORMAL HIGH (ref 70–99)

## 2023-07-27 LAB — BLOOD GAS, VENOUS
Acid-Base Excess: 0.1 mmol/L (ref 0.0–2.0)
Bicarbonate: 23.8 mmol/L (ref 20.0–28.0)
O2 Saturation: 97.5 %
Patient temperature: 37
pCO2, Ven: 35 mmHg — ABNORMAL LOW (ref 44–60)
pH, Ven: 7.44 — ABNORMAL HIGH (ref 7.25–7.43)
pO2, Ven: 78 mmHg — ABNORMAL HIGH (ref 32–45)

## 2023-07-27 LAB — CBC
HCT: 49 % — ABNORMAL HIGH (ref 36.0–46.0)
Hemoglobin: 16.3 g/dL — ABNORMAL HIGH (ref 12.0–15.0)
MCH: 28.6 pg (ref 26.0–34.0)
MCHC: 33.3 g/dL (ref 30.0–36.0)
MCV: 86.1 fL (ref 80.0–100.0)
Platelets: 208 10*3/uL (ref 150–400)
RBC: 5.69 MIL/uL — ABNORMAL HIGH (ref 3.87–5.11)
RDW: 14 % (ref 11.5–15.5)
WBC: 9.6 10*3/uL (ref 4.0–10.5)
nRBC: 0 % (ref 0.0–0.2)

## 2023-07-27 LAB — BETA-HYDROXYBUTYRIC ACID: Beta-Hydroxybutyric Acid: 0.29 mmol/L — ABNORMAL HIGH (ref 0.05–0.27)

## 2023-07-27 MED ORDER — METFORMIN HCL ER 750 MG PO TB24: 750.0000 mg | ORAL_TABLET | Freq: Every day | ORAL | 0 refills | Status: AC

## 2023-07-27 MED ORDER — ACETAMINOPHEN 500 MG PO TABS
500.0000 mg | ORAL_TABLET | Freq: Once | ORAL | Status: AC
  Administered 2023-07-27: 500 mg via ORAL
  Filled 2023-07-27: qty 1

## 2023-07-27 MED ORDER — INSULIN ASPART 100 UNIT/ML IJ SOLN
10.0000 [IU] | Freq: Once | INTRAMUSCULAR | Status: AC
  Administered 2023-07-27: 10 [IU] via SUBCUTANEOUS
  Filled 2023-07-27: qty 0.1

## 2023-07-27 MED ORDER — SODIUM CHLORIDE 0.9 % IV BOLUS
2000.0000 mL | Freq: Once | INTRAVENOUS | Status: AC
  Administered 2023-07-27: 2000 mL via INTRAVENOUS

## 2023-07-27 NOTE — ED Provider Notes (Signed)
 [place] EMERGENCY DEPARTMENT AT [facility] Provider Note   CSN: [id number] Arrival date & time: 07/27/23  1349     History  Chief Complaint  Patient presents with   Hyperglycemia    Pt presents today from PCPs office with A1c of 12.3, no hx diabetes. BS here today is 487. Pt endorses increased thirst, increased urinary frequency, "feeling crummy." Pt has hx of cirrhosis as well.     Lynn Harvey is a 58 y.o. female, history of cirrhosis, CAD, and Sjorgren's syndrome, who presents to the ED secondary to increased urine thirst x 1 week.  She states that for the last week, she has noted she had increased urine and thirst, but denies any N/V/D, abdominal pain, fevers, chills, chest pain, or shortness of breath. She has not had any infectious symptoms lately.  She states that she is feeling so bad, that her PCP ordered lab work and her A1c was 12.3     Home Medications Prior to Admission medications   Medication Sig Start Date End Date Taking? Authorizing Provider  amphetamine -dextroamphetamine  (ADDERALL) 15 MG tablet Take 0.5 tablets by mouth 2 (two) times daily. 05/17/14   Aldine Humphreys, PA  aspirin  EC 81 MG tablet Take 81 mg by mouth daily.    [provider]  ezetimibe  (ZETIA ) 10 MG tablet TAKE 1 TABLET(10 MG) BY MOUTH DAILY 02/22/23   Millicent Ally, MD  folic acid  (FOLVITE ) 1 MG tablet Take 1 tablet (1 mg total) by mouth daily. 11/20/19   Armenta Landau, MD  furosemide  (LASIX ) 40 MG tablet Take 1 tablet (40 mg total) by mouth in the morning. 03/05/22   Pokhrel, Laxman, MD  gabapentin  (NEURONTIN ) 400 MG capsule Take 1 capsule (400 mg total) by mouth 3 (three) times daily. 04/03/22   Millikan, Megan, NP  Immune Globulin , Human, (GAMMAGARD ) 20 GM/200ML SOLN Infused thru opt[facility] rx Patient taking differently: Inject into the vein See admin instructions. Every 4 weeks 06/29/21   Glory Larsen, MD  metFORMIN (GLUCOPHAGE-XR) 750 MG 24 hr tablet Take 1 tablet (750 mg  total) by mouth daily with breakfast. 07/27/23  Yes Osei Anger L, PA  metoprolol  tartrate (LOPRESSOR ) 25 MG tablet Take 0.5 tablets (12.5 mg total) by mouth 2 (two) times daily. 03/05/22   Pokhrel, Laxman, MD  Multiple Vitamin (MULTIVITAMIN WITH MINERALS) TABS tablet Take 1 tablet by mouth daily. 09/22/19   Haydee Lipa, MD  nitroGLYCERIN  (NITROSTAT ) 0.4 MG SL tablet DISSOLVE 1 TABLET UNDER THE TONGUE EVERY 5 MINUTES AS NEEDED FOR CHEST PAIN Patient taking differently: Place 0.4 mg under the tongue every 5 (five) minutes as needed for chest pain. 02/06/21   Millicent Ally, MD  omeprazole (PRILOSEC) 20 MG capsule Take 20 mg by mouth 2 (two) times daily before a meal.    [provider]  ondansetron  (ZOFRAN ) 4 MG tablet Take 1 tablet (4 mg total) by mouth every 8 (eight) hours as needed for nausea or vomiting. 07/12/18   Debbra Fairy, PA-C  pilocarpine  (SALAGEN ) 5 MG tablet Take 5 mg by mouth 3 (three) times daily.  08/18/15   [provider]  pyridOXINE  (B-6) 100 MG tablet Take 1 tablet (100 mg total) by mouth daily. 11/19/19   Armenta Landau, MD  spironolactone  (ALDACTONE ) 50 MG tablet Take 1 tablet (50 mg total) by mouth every evening. 03/05/22   Pokhrel, Laxman, MD  thiamine  (VITAMIN B-1) 100 MG tablet Take 1 tablet (100 mg total) by mouth  daily. 03/05/22   Pokhrel, Laxman, MD  thiamine  100 MG tablet Take 1 tablet (100 mg total) by mouth daily. 11/20/19   Armenta Landau, MD  valACYclovir (VALTREX) 500 MG tablet Take 500 mg by mouth 3 (three) times daily as needed (fever blister).    [provider]  zolpidem  (AMBIEN ) 5 MG tablet Take 5 mg by mouth at bedtime as needed for sleep. 12/07/14   [provider]      Allergies    Buprenorphine hcl, Doxycycline, Morphine and codeine , and Ace inhibitors    Review of Systems   Review of Systems  Endocrine: Positive for polydipsia and polyuria. Negative for polyphagia.    Physical Exam Updated Vital  Signs BP 127/84   Pulse 74   Resp 14   SpO2 97%  Physical Exam Vitals and nursing note reviewed.  Constitutional:      Appearance: Normal appearance.  HENT:     Head: Normocephalic and atraumatic.     Right Ear: Tympanic membrane normal.     Left Ear: Tympanic membrane normal.     Nose: Nose normal.     Mouth/Throat:     Mouth: Mucous membranes are moist.  Eyes:     Extraocular Movements: Extraocular movements intact.     Conjunctiva/sclera: Conjunctivae normal.     Pupils: Pupils are equal, round, and reactive to light.  Cardiovascular:     Rate and Rhythm: Normal rate and regular rhythm.  Pulmonary:     Effort: Pulmonary effort is normal.     Breath sounds: Normal breath sounds.  Abdominal:     General: Abdomen is flat. Bowel sounds are normal.     Palpations: Abdomen is soft.  Musculoskeletal:        General: Normal range of motion.     Cervical back: Normal range of motion and neck supple.  Skin:    General: Skin is warm and dry.     Capillary Refill: Capillary refill takes less than 2 seconds.  Neurological:     General: No focal deficit present.     Mental Status: She is alert.  Psychiatric:        Mood and Affect: Mood normal.        Thought Content: Thought content normal.    ED Results / Procedures / Treatments   Labs (all labs ordered are listed, but only abnormal results are displayed) Labs Reviewed  CBC - Abnormal; Notable for the following components:      Result Value   RBC 5.69 (*)    Hemoglobin 16.3 (*)    HCT 49.0 (*)    All other components within normal limits  URINALYSIS, ROUTINE W REFLEX MICROSCOPIC - Abnormal; Notable for the following components:   APPearance HAZY (*)    Specific Gravity, Urine 1.032 (*)    Glucose, UA >=500 (*)    Protein, ur 100 (*)    Bacteria, UA RARE (*)    All other components within normal limits  COMPREHENSIVE METABOLIC PANEL WITH GFR - Abnormal; Notable for the following components:   Sodium 128 (*)     Chloride 92 (*)    Glucose, Bld 469 (*)    Creatinine, Ser 1.27 (*)    GFR, Estimated 50 (*)    All other components within normal limits  BETA-HYDROXYBUTYRIC ACID - Abnormal; Notable for the following components:   Beta-Hydroxybutyric Acid 0.29 (*)    All other components within normal limits  BLOOD GAS, VENOUS - Abnormal; Notable for  the following components:   pH, Ven 7.44 (*)    pCO2, Ven 35 (*)    pO2, Ven 78 (*)    All other components within normal limits  CBG MONITORING, ED - Abnormal; Notable for the following components:   Glucose-Capillary 487 (*)    All other components within normal limits  CBG MONITORING, ED - Abnormal; Notable for the following components:   Glucose-Capillary 300 (*)    All other components within normal limits  I-STAT CHEM 8, ED - Abnormal; Notable for the following components:   Sodium 132 (*)    Chloride 94 (*)    Creatinine, Ser 1.10 (*)    Glucose, Bld 473 (*)    Hemoglobin 16.7 (*)    HCT 49.0 (*)    All other components within normal limits  CBG MONITORING, ED  CBG MONITORING, ED    EKG None  Radiology No results found.  Procedures Procedures    Medications Ordered in ED Medications  sodium chloride  0.9 % bolus 2,000 mL (0 mLs Intravenous Stopped 07/27/23 1705)  insulin aspart (novoLOG) injection 10 Units (10 Units Subcutaneous Given 07/27/23 1545)    ED Course/ Medical Decision Making/ A&P                                 Medical Decision Making Pt is a 58 y.o. female, here for elevated A1c, increased thirst and urination. She has had her symptoms for the past week. She is over all well appearing on exam. Will obtain CBC, CMP, VBG, beta hydroxybutyrate acid and urinalysis for further eval. Will give insulin and fluids to help with hyperglycemia. No hx of DM  Amount and/or Complexity of Data Reviewed Labs: ordered.    Details: pH 7.44, glucose 487, beta hydroxybutyric acid 0.29 Discussion of management or test interpretation  with external provider(s): Patient's blood work is overall reassuring, no evidence of anion gap or evidence of acidosis. Has hyperglycemia, likely 2/2 to untreated diabetes---especially given A1c of 12.3%. She is not on any medications for DM, we discussed starting insulin, but she would prefer to wait to see her regular PCP for further education. We will start her on metformin for now, and I instructed her on healthy choices and exercise. She was informed she will likely start insulin after seeing her PCP. She does have a hx of cirrhosis, which may be contributing to this new diagnosis--PCP to further evaluate  Risk Prescription drug management.    Final Clinical Impression(s) / ED Diagnoses Final diagnoses:  Hyperglycemia    Rx / DC Orders ED Discharge Orders          Ordered    metFORMIN (GLUCOPHAGE-XR) 750 MG 24 hr tablet  Daily with breakfast        07/27/23 1743              Arla Boutwell Rolena Click, PA 07/27/23 1809    Flonnie Humphrey, DO 07/27/23 2317

## 2023-07-27 NOTE — Discharge Instructions (Addendum)
 Please follow-up with your PCP on Monday. Your A1c is greater than 12 thus you will likely need to be started on insulin. We have discussed this and will hold off on prescribing this until you see your PCP. For now, we will give you metformin to start the process and see the information above. If you have intractable nausea, vomiting, abdominal pain, confusion, please return to the ED.

## 2023-09-23 ENCOUNTER — Other Ambulatory Visit: Payer: Self-pay | Admitting: *Deleted

## 2023-09-23 DIAGNOSIS — I70213 Atherosclerosis of native arteries of extremities with intermittent claudication, bilateral legs: Secondary | ICD-10-CM

## 2023-10-10 ENCOUNTER — Ambulatory Visit: Payer: BC Managed Care – PPO | Admitting: Vascular Surgery

## 2023-10-23 NOTE — Progress Notes (Deleted)
 Office Note    HPI: Lynn Harvey is a 58 y.o. (05/03/1965) female presenting with claudication to discuss exercise ABI findings which was ordered to rule out inflow disease  Originally from [facility] , she moved to [place] years ago to be closer to her mother.  Her mother has since passed, and she continues to live at the home.  [name] works full-time in [employer].  She is a lifetime smoker, currently 1 pack/day.  A mother of 3, 2 of her children are still living.  She has 4 grandchildren whom she is very close with.  Over the last 2 years, [name] has appreciated bilateral lower extremity cramping with ambulation.  This is more pronounced in the right leg as compared to the left.  She has a history of neuropathy, and originally contributed this pain to her neuropathy, however was referred to neurosurgery for a second opinion.  Dr. Lanis thought that her pain was likely claudication from PAD.  [name] can ambulate roughly 50 yards before appreciating symptoms in her calves and thighs.  She is able to walk through the pain on most occasions, and the pain does not stop her from doing everyday activities like grocery shopping.  It is nearly-omnipresent with ambulation however.  She notes that even when getting up from work, and walking to the bathroom.  At the time her last visit we discussed pursuing exercise ABIs to see if she has inflow disease that would explain her level of claudication.  She presents today to discuss these findings.  The pt is not on a statin for cholesterol management.  The pt is not on a daily aspirin .   Other AC:  - The pt is not on medication for hypertension.   The pt is not diabetic.  Tobacco hx: 1 pack/day current  Past Medical History:  Diagnosis Date   Acute sensory neuropathy 09/18/2019   ADD (attention deficit disorder)    Allergy    Anemia    Anxiety    Coronary artery disease    a. s/p NSTEMI in 2015 with angioplasty alone to mid-LAD, repeat cath  within the same month showing a patent site   Depression    Depression    Fatty liver    GAD (generalized anxiety disorder)    GERD (gastroesophageal reflux disease)    Gestational diabetes    History of heart attack    Hyperlipidemia    Hypertension    Insomnia    Lupus    Milia    Myocardial infarction (HCC) 06/2013   Rectal abnormality    Sicca syndrome (HCC)    Sjogren's syndrome (HCC)    Telangiectasia    Vitamin D deficiency     Past Surgical History:  Procedure Laterality Date   ABDOMINAL HYSTERECTOMY     fibroids, endometrosis.    CHOLECYSTECTOMY  08/2018   COLON SURGERY     Flexible Sigmoidoscopy   COLONOSCOPY N/A 01/08/2014   Procedure: COLONOSCOPY;  Surgeon: Belvie JONETTA Just, MD;  Location: [facility];  Service: Endoscopy;  Laterality: N/A;   ESOPHAGOGASTRODUODENOSCOPY N/A 01/08/2014   Procedure: ESOPHAGOGASTRODUODENOSCOPY (EGD);  Surgeon: Belvie JONETTA Just, MD;  Location: [facility];  Service: Endoscopy;  Laterality: N/A;   LEFT HEART CATHETERIZATION WITH CORONARY ANGIOGRAM N/A 06/24/2013   Procedure: LEFT HEART CATHETERIZATION WITH CORONARY ANGIOGRAM;  Surgeon: Debby DELENA Sor, MD;  Location: [facility] CATH LAB;  Service: Cardiovascular;  Laterality: N/A;   LEFT HEART CATHETERIZATION WITH CORONARY ANGIOGRAM N/A 07/17/2013   Procedure: LEFT HEART  CATHETERIZATION WITH CORONARY ANGIOGRAM;  Surgeon: Lonni JONETTA Cash, MD;  Location: [facility] CATH LAB;  Service: Cardiovascular;  Laterality: N/A;   TONSILECTOMY, ADENOIDECTOMY, BILATERAL MYRINGOTOMY AND TUBES     WRIST SURGERY  1987   RIGHT; bone from forearm grafted to wrist    Social History   Socioeconomic History   Marital status: Married    Spouse name: Adriana   Number of children: 3   Years of education: 12   Highest education level: Not on file  Occupational History   Occupation: CUSTOMER SERVICE    Employer: [employer]  Tobacco Use   Smoking status: Every Day    Current packs/day: 1.00    Average  packs/day: 1 pack/day for 40.8 years (40.8 ttl pk-yrs)    Types: Cigarettes    Start date: 12/25/1982   Smokeless tobacco: Never   Tobacco comments:    09/03/19 < 1.5 ppd  Vaping Use   Vaping status: Never Used  Substance and Sexual Activity   Alcohol use: Not Currently    Comment: Drank 24 cans/week until September 2021   Drug use: No   Sexual activity: Yes    Partners: Male    Birth control/protection: Surgical  Other Topics Concern   Not on file  Social History Narrative   Lives with her husband, their son [name], and her daughter Hospital doctor and Amber's son [name].   Social Drivers of Corporate investment banker Strain: Not on file  Food Insecurity: Low Risk  (05/17/2022)   Received from [facility]   Hunger Vital Sign    Within the past 12 months, you worried that your food would run out before you got money to buy more: Never true    Within the past 12 months, the food you bought just didn't last and you didn't have money to get more. : Never true  Transportation Needs: Not on file (05/17/2022)  Physical Activity: Not on file  Stress: Not on file  Social Connections: Not on file  Intimate Partner Violence: Not At Risk (03/05/2022)   Humiliation, Afraid, Rape, and Kick questionnaire    Fear of Current or Ex-Partner: No    Emotionally Abused: No    Physically Abused: No    Sexually Abused: No   Family History  Problem Relation Age of Onset   COPD Mother        emphysema   Lung cancer Mother    Heart disease Father 63       s/p 3V CABG   Depression Father    Barrett's esophagus Father    Mental illness Sister        depression   Depression Sister    Multiple sclerosis Sister    Lung cancer Maternal Aunt    Diabetes Maternal Grandmother    Heart disease Maternal Grandmother    Diabetes Maternal Grandfather    Cancer Maternal Grandfather        lung cancer   Diabetes Paternal Grandmother    Kidney disease Paternal Grandmother    Diabetes Paternal Grandfather     Heart disease Paternal Grandfather    Mental illness Son        ADHD, bipolar disorder   Neuropathy Neg Hx     Current Outpatient Medications  Medication Sig Dispense Refill   amphetamine -dextroamphetamine  (ADDERALL) 15 MG tablet Take 0.5 tablets by mouth 2 (two) times daily. 60 tablet 0   aspirin  EC 81 MG tablet Take 81 mg by mouth daily.  ezetimibe  (ZETIA ) 10 MG tablet TAKE 1 TABLET(10 MG) BY MOUTH DAILY 15 tablet 0   folic acid  (FOLVITE ) 1 MG tablet Take 1 tablet (1 mg total) by mouth daily.     furosemide  (LASIX ) 40 MG tablet Take 1 tablet (40 mg total) by mouth in the morning. 30 tablet 0   gabapentin  (NEURONTIN ) 400 MG capsule Take 1 capsule (400 mg total) by mouth 3 (three) times daily. 90 capsule 0   Immune Globulin , Human, (GAMMAGARD ) 20 GM/200ML SOLN Infused thru opt[facility] rx (Patient taking differently: Inject into the vein See admin instructions. Every 4 weeks)     metFORMIN  (GLUCOPHAGE -XR) 750 MG 24 hr tablet Take 1 tablet (750 mg total) by mouth daily with breakfast. 30 tablet 0   metoprolol  tartrate (LOPRESSOR ) 25 MG tablet Take 0.5 tablets (12.5 mg total) by mouth 2 (two) times daily. 60 tablet 2   Multiple Vitamin (MULTIVITAMIN WITH MINERALS) TABS tablet Take 1 tablet by mouth daily. 30 tablet 0   nitroGLYCERIN  (NITROSTAT ) 0.4 MG SL tablet DISSOLVE 1 TABLET UNDER THE TONGUE EVERY 5 MINUTES AS NEEDED FOR CHEST PAIN (Patient taking differently: Place 0.4 mg under the tongue every 5 (five) minutes as needed for chest pain.) 45 tablet 1   omeprazole (PRILOSEC) 20 MG capsule Take 20 mg by mouth 2 (two) times daily before a meal.     ondansetron  (ZOFRAN ) 4 MG tablet Take 1 tablet (4 mg total) by mouth every 8 (eight) hours as needed for nausea or vomiting. 12 tablet 0   pilocarpine  (SALAGEN ) 5 MG tablet Take 5 mg by mouth 3 (three) times daily.      pyridOXINE  (B-6) 100 MG tablet Take 1 tablet (100 mg total) by mouth daily.     spironolactone  (ALDACTONE ) 50 MG tablet Take 1 tablet  (50 mg total) by mouth every evening. 30 tablet 0   thiamine  (VITAMIN B-1) 100 MG tablet Take 1 tablet (100 mg total) by mouth daily. 100 tablet 0   thiamine  100 MG tablet Take 1 tablet (100 mg total) by mouth daily.     valACYclovir (VALTREX) 500 MG tablet Take 500 mg by mouth 3 (three) times daily as needed (fever blister).     zolpidem  (AMBIEN ) 5 MG tablet Take 5 mg by mouth at bedtime as needed for sleep.     No current facility-administered medications for this visit.    Allergies  Allergen Reactions   Buprenorphine Hcl Nausea And Vomiting   Doxycycline Nausea Only   Morphine And Codeine  Nausea And Vomiting   Ace Inhibitors Cough     REVIEW OF SYSTEMS:  [X]  denotes positive finding, [ ]  denotes negative finding Cardiac  Comments:  Chest pain or chest pressure:    Shortness of breath upon exertion:    Short of breath when lying flat:    Irregular heart rhythm:        Vascular    Pain in calf, thigh, or hip brought on by ambulation: X   Pain in feet at night that wakes you up from your sleep:     Blood clot in your veins:    Leg swelling:         Pulmonary    Oxygen at home:    Productive cough:     Wheezing:         Neurologic    Sudden weakness in arms or legs:     Sudden numbness in arms or legs:     Sudden onset of difficulty speaking  or slurred speech:    Temporary loss of vision in one eye:     Problems with dizziness:         Gastrointestinal    Blood in stool:     Vomited blood:         Genitourinary    Burning when urinating:     Blood in urine:        Psychiatric    Major depression:         Hematologic    Bleeding problems:    Problems with blood clotting too easily:        Skin    Rashes or ulcers:        Constitutional    Fever or chills:      PHYSICAL EXAMINATION:  There were no vitals filed for this visit.  General:  WDWN in NAD; vital signs documented above Gait: Not observed HENT: WNL, normocephalic Pulmonary: normal  non-labored breathing , without wheezing Cardiac: regular HR Abdomen: soft, NT, no masses Skin: without rashes Vascular Exam/Pulses:  Right Left  Radial 2+ (normal) 2+ (normal)  Ulnar    Femoral    Popliteal    DP 2+ (normal) 2+ (normal)  PT     Extremities: without ischemic changes, without Gangrene , without cellulitis; without open wounds;  Musculoskeletal: no muscle wasting or atrophy  Neurologic: A&O X 3;  No focal weakness or paresthesias are detected Psychiatric:  The pt has Normal affect.   Non-Invasive Vascular Imaging:   ABI Findings:  +---------+------------------+-----+----------+--------+  Right   Rt Pressure (mmHg)IndexWaveform  Comment   +---------+------------------+-----+----------+--------+  Brachial 116                    triphasic           +---------+------------------+-----+----------+--------+  PTA     83                0.65 monophasic          +---------+------------------+-----+----------+--------+  DP      68                0.54 monophasic          +---------+------------------+-----+----------+--------+  Great Toe74                0.58                     +---------+------------------+-----+----------+--------+   +---------+------------------+-----+---------+-------+  Left    Lt Pressure (mmHg)IndexWaveform Comment  +---------+------------------+-----+---------+-------+  Brachial 127                    triphasic         +---------+------------------+-----+---------+-------+  PTA     102               0.80 biphasic          +---------+------------------+-----+---------+-------+  DP      78                0.61 biphasic          +---------+------------------+-----+---------+-------+  Great Toe76                0.60                   +---------+------------------+-----+---------+-------+   +-------+-----------+-----------+------------+------------+  ABI/TBIToday's ABIToday's  TBIPrevious ABIPrevious TBI  +-------+-----------+-----------+------------+------------+  Right 0.65       0.58                                 +-------+-----------+-----------+------------+------------+  Left  0.8        0.6                                  +-------+-----------+-----------+------------+------------+     ASSESSMENT/PLAN: NAYELI CALVERT is a 58 y.o. female presenting with claudication symptoms, right greater than left.  On physical exam, she had a palpable pulse in the foot bilaterally, but depressed ABIs were appreciated.  I evaluated prior CT scans, most notably the 11/17/2019 CT abdomen pelvis without contrast.  This demonstrates a small terminal aorta, with circumferential calcific disease and atherosclerotic disease extending into small iliac arteries bilaterally.  I think her claudication symptoms are from peripheral arterial disease manifesting as inflow stenosis.  She had a 2+ palpable left femoral pulse with barely-palpable right femoral pulse.  I had a long conversation with [place] regarding the above.  We discussed that the best treatment of claudication symptoms are aspirin , high intensity statin, as well as walking therapy.  She is aware that if she continues to smoke, her claudication symptoms will progress regardless, and she will require intervention.  With her small size, I was very clear that stenting would likely have limited durability as her arteries are also small.  She is aware that to fix inflow, some patients require aortobifemoral bypass, which is a very large surgery.  At this time, we discussed continued medical management with medication optimization and exercise program.  My plan is to see her back in 6 months time with exercise ABI to ensure that inflow disease is the causative factor for her claudication.  I gave her my card and asked her to call should her symptoms worsen.  Recommend the following which can slow the progression of  atherosclerosis and reduce the risk of major adverse cardiac / limb events:  Aspirin  81mg  PO QD.  Atorvastatin  40-80mg  PO QD (or other high intensity statin therapy). Complete cessation from all tobacco products. Blood glucose control with goal A1c < 7%. Blood pressure control with goal blood pressure < 140/90 mmHg. Lipid reduction therapy with goal LDL-C <100 mg/dL (<29 if symptomatic from PAD).    Fonda FORBES Rim, MD [facility] [phone] Total time of patient care including pre-visit research, consultation, and documentation greater than 45 minutes

## 2023-10-24 ENCOUNTER — Ambulatory Visit (HOSPITAL_COMMUNITY): Payer: Self-pay

## 2023-10-24 ENCOUNTER — Ambulatory Visit: Payer: Self-pay | Admitting: Vascular Surgery

## 2023-10-28 ENCOUNTER — Ambulatory Visit (HOSPITAL_COMMUNITY)
Admission: RE | Admit: 2023-10-28 | Discharge: 2023-10-28 | Disposition: A | Discharge status: Home / Self Care | Source: Ambulatory Visit | Attending: Vascular Surgery

## 2023-10-28 DIAGNOSIS — I70213 Atherosclerosis of native arteries of extremities with intermittent claudication, bilateral legs: Secondary | ICD-10-CM

## 2023-10-28 LAB — VAS US LOWER EXTREMITY EXERCISE
Left ABI: 0.77
Right ABI: 0.69

## 2023-10-29 ENCOUNTER — Encounter: Payer: Self-pay | Admitting: Physician Assistant

## 2023-10-29 NOTE — Progress Notes (Unsigned)
 Office Note    HPI: Lynn Harvey is a 58 y.o. (28-Jul-1965) female presenting with known PAD and symptoms of claudication to discuss exercise ABI findings which was ordered to rule out inflow disease.   Originally from [facility] [place] , she moved to [place] years ago to be closer to her mother.  Her mother has since passed, and she continues to live at the home.  [name] works full-time in [employer].  She is a lifetime smoker, currently 1 pack/day.  A mother of 3, 2 of her children are still living.  She has 4 grandchildren whom she is very close with.  Over the last 2 years, Lynn Harvey has appreciated bilateral lower extremity cramping with ambulation.  This is more pronounced in the right leg as compared to the left.  She has a history of neuropathy, and originally contributed this pain to her neuropathy, however was referred to neurosurgery for a second opinion.  Dr. Lanis thought that her pain was likely claudication from PAD.  At the time her last visit we discussed pursuing exercise ABIs to see if she has inflow disease that would explain her level of claudication.  She presents today to discuss these findings.  On exam today, [name] was doing well.  Similar to her last visit, she can ambulate roughly 50 yards prior to appreciating cramping pain in her calves and thighs.  She can walk through the pain on most occasions, but appreciated worsening claudication symptoms at the beach this summer.  She remains independent, and is able to grocery shop, and work full-time.  She continues to smoke 1 pack/day.  She has several stressors in her life, including her husband who battles with alcoholism, and a grandson who lives with her, and battles drug addiction.  Cigarettes have been her vice. Since last seen, she has been diagnosed with diabetes and had an initial A1c of 12    The pt is not on a statin for cholesterol management.  The pt is not on a daily aspirin .   Other AC:  - The pt is not on  medication for hypertension.   The pt is diabetic.  Tobacco hx: 1 pack/day current  Past Medical History:  Diagnosis Date   Acute sensory neuropathy 09/18/2019   ADD (attention deficit disorder)    Allergy    Anemia    Anxiety    Coronary artery disease    a. s/p NSTEMI in 2015 with angioplasty alone to mid-LAD, repeat cath within the same month showing a patent site   Depression    Depression    Fatty liver    GAD (generalized anxiety disorder)    GERD (gastroesophageal reflux disease)    Gestational diabetes    History of heart attack    Hyperlipidemia    Hypertension    Insomnia    Lupus    Milia    Myocardial infarction (HCC) 06/2013   Rectal abnormality    Sicca syndrome (HCC)    Sjogren's syndrome (HCC)    Telangiectasia    Vitamin D deficiency     Past Surgical History:  Procedure Laterality Date   ABDOMINAL HYSTERECTOMY     fibroids, endometrosis.    CHOLECYSTECTOMY  08/2018   COLON SURGERY     Flexible Sigmoidoscopy   COLONOSCOPY N/A 01/08/2014   Procedure: COLONOSCOPY;  Surgeon: Belvie JONETTA Just, MD;  Location: [facility];  Service: Endoscopy;  Laterality: N/A;   ESOPHAGOGASTRODUODENOSCOPY N/A 01/08/2014   Procedure: ESOPHAGOGASTRODUODENOSCOPY (EGD);  Surgeon: Belvie JONETTA Just,  MD;  Location: [facility];  Service: Endoscopy;  Laterality: N/A;   LEFT HEART CATHETERIZATION WITH CORONARY ANGIOGRAM N/A 06/24/2013   Procedure: LEFT HEART CATHETERIZATION WITH CORONARY ANGIOGRAM;  Surgeon: Debby DELENA Sor, MD;  Location: [facility] CATH LAB;  Service: Cardiovascular;  Laterality: N/A;   LEFT HEART CATHETERIZATION WITH CORONARY ANGIOGRAM N/A 07/17/2013   Procedure: LEFT HEART CATHETERIZATION WITH CORONARY ANGIOGRAM;  Surgeon: Lonni JONETTA Cash, MD;  Location: [facility] CATH LAB;  Service: Cardiovascular;  Laterality: N/A;   TONSILECTOMY, ADENOIDECTOMY, BILATERAL MYRINGOTOMY AND TUBES     WRIST SURGERY  1987   RIGHT; bone from forearm grafted to wrist    Social History    Socioeconomic History   Marital status: Married    Spouse name: Adriana   Number of children: 3   Years of education: 12   Highest education level: Not on file  Occupational History   Occupation: CUSTOMER SERVICE    Employer: [employer]  Tobacco Use   Smoking status: Every Day    Current packs/day: 1.00    Average packs/day: 1 pack/day for 40.8 years (40.8 ttl pk-yrs)    Types: Cigarettes    Start date: 12/25/1982   Smokeless tobacco: Never   Tobacco comments:    09/03/19 < 1.5 ppd  Vaping Use   Vaping status: Never Used  Substance and Sexual Activity   Alcohol use: Not Currently    Comment: Drank 24 cans/week until September 2021   Drug use: No   Sexual activity: Yes    Partners: Male    Birth control/protection: Surgical  Other Topics Concern   Not on file  Social History Narrative   Lives with her husband, their son Lynn Harvey, and her daughter Hospital doctor and Amber's son [name].   Social Drivers of Corporate investment banker Strain: Not on file  Food Insecurity: Low Risk  (05/17/2022)   Received from [facility]   Hunger Vital Sign    Within the past 12 months, you worried that your food would run out before you got money to buy more: Never true    Within the past 12 months, the food you bought just didn't last and you didn't have money to get more. : Never true  Transportation Needs: Not on file (05/17/2022)  Physical Activity: Not on file  Stress: Not on file  Social Connections: Not on file  Intimate Partner Violence: Not At Risk (03/05/2022)   Humiliation, Afraid, Rape, and Kick questionnaire    Fear of Current or Ex-Partner: No    Emotionally Abused: No    Physically Abused: No    Sexually Abused: No   Family History  Problem Relation Age of Onset   COPD Mother        emphysema   Lung cancer Mother    Heart disease Father 47       s/p 3V CABG   Depression Father    Barrett's esophagus Father    Mental illness Sister        depression   Depression  Sister    Multiple sclerosis Sister    Lung cancer Maternal Aunt    Diabetes Maternal Grandmother    Heart disease Maternal Grandmother    Diabetes Maternal Grandfather    Cancer Maternal Grandfather        lung cancer   Diabetes Paternal Grandmother    Kidney disease Paternal Grandmother    Diabetes Paternal Grandfather    Heart disease Paternal Grandfather    Mental illness Son  ADHD, bipolar disorder   Neuropathy Neg Hx     Current Outpatient Medications  Medication Sig Dispense Refill   amphetamine -dextroamphetamine  (ADDERALL) 15 MG tablet Take 0.5 tablets by mouth 2 (two) times daily. 60 tablet 0   aspirin  EC 81 MG tablet Take 81 mg by mouth daily.     ezetimibe  (ZETIA ) 10 MG tablet TAKE 1 TABLET(10 MG) BY MOUTH DAILY 15 tablet 0   folic acid  (FOLVITE ) 1 MG tablet Take 1 tablet (1 mg total) by mouth daily.     furosemide  (LASIX ) 40 MG tablet Take 1 tablet (40 mg total) by mouth in the morning. 30 tablet 0   gabapentin  (NEURONTIN ) 400 MG capsule Take 1 capsule (400 mg total) by mouth 3 (three) times daily. 90 capsule 0   Immune Globulin , Human, (GAMMAGARD ) 20 GM/200ML SOLN Infused thru [facility] rx (Patient taking differently: Inject into the vein See admin instructions. Every 4 weeks)     metFORMIN  (GLUCOPHAGE -XR) 750 MG 24 hr tablet Take 1 tablet (750 mg total) by mouth daily with breakfast. 30 tablet 0   metoprolol  tartrate (LOPRESSOR ) 25 MG tablet Take 0.5 tablets (12.5 mg total) by mouth 2 (two) times daily. 60 tablet 2   Multiple Vitamin (MULTIVITAMIN WITH MINERALS) TABS tablet Take 1 tablet by mouth daily. 30 tablet 0   nitroGLYCERIN  (NITROSTAT ) 0.4 MG SL tablet DISSOLVE 1 TABLET UNDER THE TONGUE EVERY 5 MINUTES AS NEEDED FOR CHEST PAIN (Patient taking differently: Place 0.4 mg under the tongue every 5 (five) minutes as needed for chest pain.) 45 tablet 1   omeprazole (PRILOSEC) 20 MG capsule Take 20 mg by mouth 2 (two) times daily before a meal.     ondansetron  (ZOFRAN ) 4  MG tablet Take 1 tablet (4 mg total) by mouth every 8 (eight) hours as needed for nausea or vomiting. 12 tablet 0   pilocarpine  (SALAGEN ) 5 MG tablet Take 5 mg by mouth 3 (three) times daily.      pyridOXINE  (B-6) 100 MG tablet Take 1 tablet (100 mg total) by mouth daily.     spironolactone  (ALDACTONE ) 50 MG tablet Take 1 tablet (50 mg total) by mouth every evening. 30 tablet 0   thiamine  (VITAMIN B-1) 100 MG tablet Take 1 tablet (100 mg total) by mouth daily. 100 tablet 0   thiamine  100 MG tablet Take 1 tablet (100 mg total) by mouth daily.     valACYclovir (VALTREX) 500 MG tablet Take 500 mg by mouth 3 (three) times daily as needed (fever blister).     zolpidem  (AMBIEN ) 5 MG tablet Take 5 mg by mouth at bedtime as needed for sleep.     No current facility-administered medications for this visit.    Allergies  Allergen Reactions   Buprenorphine Hcl Nausea And Vomiting   Doxycycline Nausea Only   Morphine And Codeine  Nausea And Vomiting   Ace Inhibitors Cough     REVIEW OF SYSTEMS:  [X]  denotes positive finding, [ ]  denotes negative finding Cardiac  Comments:  Chest pain or chest pressure:    Shortness of breath upon exertion:    Short of breath when lying flat:    Irregular heart rhythm:        Vascular    Pain in calf, thigh, or hip brought on by ambulation: X   Pain in feet at night that wakes you up from your sleep:     Blood clot in your veins:    Leg swelling:  Pulmonary    Oxygen at home:    Productive cough:     Wheezing:         Neurologic    Sudden weakness in arms or legs:     Sudden numbness in arms or legs:     Sudden onset of difficulty speaking or slurred speech:    Temporary loss of vision in one eye:     Problems with dizziness:         Gastrointestinal    Blood in stool:     Vomited blood:         Genitourinary    Burning when urinating:     Blood in urine:        Psychiatric    Major depression:         Hematologic    Bleeding  problems:    Problems with blood clotting too easily:        Skin    Rashes or ulcers:        Constitutional    Fever or chills:      PHYSICAL EXAMINATION:  There were no vitals filed for this visit.  General:  WDWN in NAD; vital signs documented above Gait: Not observed HENT: WNL, normocephalic Pulmonary: normal non-labored breathing , without wheezing Cardiac: regular HR Abdomen: soft, NT, no masses Skin: without rashes Vascular Exam/Pulses:  Right Left  Radial 2+ (normal) 2+ (normal)  Ulnar    Femoral    Popliteal    DP 2+ (normal) 2+ (normal)  PT     Extremities: without ischemic changes, without Gangrene , without cellulitis; without open wounds;  Musculoskeletal: no muscle wasting or atrophy  Neurologic: A&O X 3;  No focal weakness or paresthesias are detected Psychiatric:  The pt has Normal affect.   Non-Invasive Vascular Imaging:   ABI Findings:  +---------+------------------+-----+----------+--------+  Right   Rt Pressure (mmHg)IndexWaveform  Comment   +---------+------------------+-----+----------+--------+  Brachial 116                    triphasic           +---------+------------------+-----+----------+--------+  PTA     83                0.65 monophasic          +---------+------------------+-----+----------+--------+  DP      68                0.54 monophasic          +---------+------------------+-----+----------+--------+  Great Toe74                0.58                     +---------+------------------+-----+----------+--------+   +---------+------------------+-----+---------+-------+  Left    Lt Pressure (mmHg)IndexWaveform Comment  +---------+------------------+-----+---------+-------+  Brachial 127                    triphasic         +---------+------------------+-----+---------+-------+  PTA     102               0.80 biphasic          +---------+------------------+-----+---------+-------+   DP      78                0.61 biphasic          +---------+------------------+-----+---------+-------+  Burnetta Brown  0.60                   +---------+------------------+-----+---------+-------+   +-------+-----------+-----------+------------+------------+  ABI/TBIToday's ABIToday's TBIPrevious ABIPrevious TBI  +-------+-----------+-----------+------------+------------+  Right 0.65       0.58                                 +-------+-----------+-----------+------------+------------+  Left  0.8        0.6                                  +-------+-----------+-----------+------------+------------+     ASSESSMENT/PLAN: DILAN FULLENWIDER is a 58 y.o. female presenting with claudication symptoms, right greater than left.  Exercise ABI reviewed demonstrating depressed ABIs which did not return to baseline level, a sign of inflow disease.  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  I looked back at previous CT scans demonstrating a small aorta, as well as small iliac arteries bilaterally.  I was honest with her that any intervention on small arteries at the age of 20 would have limited durability other than aortobifemoral bypass.  At this time, with claudication symptoms, we discussed continued medical management.  We discussed a walking program, smoking cessation.  She states that she has patches, but is unsure if she is ready to stop smoking. I have started her on cilostazol  in an effort to help with some of her claudication symptoms.  We discussed that intervention will be only offered for lifestyle-limiting claudication, ischemic rest pain, tissue loss.  Prior to intervention, I would order a CT angio abdomen pelvis to further define the level of inflow stenosis.  My plan is to see her back in 6 months time to discuss her symptoms.  She was asked to call my  office sooner should any questions or concerns arise.   Recommend the following which can slow the progression of atherosclerosis and reduce the risk of major adverse cardiac / limb events:  Aspirin  81mg  PO QD.  Atorvastatin  40-80mg  PO QD (or other high intensity statin therapy). Complete cessation from all tobacco products. Blood glucose control with goal A1c < 7%. Blood pressure control with goal blood pressure < 140/90 mmHg. Lipid reduction therapy with goal LDL-C <100 mg/dL (<29 if symptomatic from PAD).    Fonda FORBES Rim, MD [facility] [phone] Total time of patient care including pre-visit research, consultation, and documentation greater than 30 minutes

## 2023-10-31 ENCOUNTER — Encounter: Payer: Self-pay | Admitting: Physician Assistant

## 2023-10-31 ENCOUNTER — Ambulatory Visit: Attending: Vascular Surgery | Admitting: Vascular Surgery

## 2023-10-31 ENCOUNTER — Encounter: Payer: Self-pay | Admitting: Vascular Surgery

## 2023-10-31 ENCOUNTER — Other Ambulatory Visit (HOSPITAL_COMMUNITY): Payer: Self-pay

## 2023-10-31 VITALS — BP 133/76 | HR 69 | Temp 98.1°F | Ht 63.0 in | Wt 122.8 lb

## 2023-10-31 DIAGNOSIS — I70213 Atherosclerosis of native arteries of extremities with intermittent claudication, bilateral legs: Secondary | ICD-10-CM

## 2023-10-31 MED ORDER — CILOSTAZOL 100 MG PO TABS
100.0000 mg | ORAL_TABLET | Freq: Two times a day (BID) | ORAL | 11 refills | Status: AC
  Filled 2023-10-31: qty 60, 30d supply, fill #0
  Filled 2023-12-09: qty 60, 30d supply, fill #1
  Filled 2024-01-13: qty 60, 30d supply, fill #2
  Filled 2024-02-17: qty 60, 30d supply, fill #3
  Filled 2024-03-26: qty 60, 30d supply, fill #4

## 2023-11-01 ENCOUNTER — Other Ambulatory Visit: Payer: Self-pay

## 2023-11-01 DIAGNOSIS — I739 Peripheral vascular disease, unspecified: Secondary | ICD-10-CM

## 2024-02-02 ENCOUNTER — Encounter: Payer: Self-pay | Admitting: Vascular Surgery

## 2024-02-03 ENCOUNTER — Other Ambulatory Visit: Payer: Self-pay | Admitting: Vascular Surgery

## 2024-02-03 DIAGNOSIS — M79604 Pain in right leg: Secondary | ICD-10-CM

## 2024-02-04 ENCOUNTER — Ambulatory Visit (HOSPITAL_COMMUNITY): Admission: RE | Admit: 2024-02-04 | Discharge: 2024-02-04 | Attending: Surgery | Admitting: Surgery

## 2024-02-04 DIAGNOSIS — M79604 Pain in right leg: Secondary | ICD-10-CM | POA: Insufficient documentation

## 2024-02-17 ENCOUNTER — Other Ambulatory Visit (HOSPITAL_COMMUNITY): Payer: Self-pay

## 2024-03-26 ENCOUNTER — Other Ambulatory Visit (HOSPITAL_COMMUNITY): Payer: Self-pay

## 2024-03-26 ENCOUNTER — Encounter: Payer: Self-pay | Admitting: Physician Assistant

## 2024-03-27 ENCOUNTER — Other Ambulatory Visit (HOSPITAL_COMMUNITY): Payer: Self-pay

## 2024-04-30 ENCOUNTER — Ambulatory Visit: Admitting: Vascular Surgery

## 2024-04-30 ENCOUNTER — Encounter (HOSPITAL_COMMUNITY)
